# Patient Record
Sex: Female | Born: 1950
Health system: Southern US, Community
[De-identification: ages and names within clinical notes are randomized; demographics above are authoritative.]

## PROBLEM LIST (undated history)

## (undated) DIAGNOSIS — F988 Other specified behavioral and emotional disorders with onset usually occurring in childhood and adolescence: Secondary | ICD-10-CM

## (undated) DIAGNOSIS — G4730 Sleep apnea, unspecified: Secondary | ICD-10-CM

## (undated) DIAGNOSIS — E039 Hypothyroidism, unspecified: Secondary | ICD-10-CM

## (undated) DIAGNOSIS — R7301 Impaired fasting glucose: Secondary | ICD-10-CM

## (undated) DIAGNOSIS — L309 Dermatitis, unspecified: Secondary | ICD-10-CM

## (undated) DIAGNOSIS — F1910 Other psychoactive substance abuse, uncomplicated: Secondary | ICD-10-CM

## (undated) DIAGNOSIS — L709 Acne, unspecified: Secondary | ICD-10-CM

## (undated) DIAGNOSIS — T7840XA Allergy, unspecified, initial encounter: Secondary | ICD-10-CM

## (undated) DIAGNOSIS — I639 Cerebral infarction, unspecified: Secondary | ICD-10-CM

## (undated) DIAGNOSIS — F1010 Alcohol abuse, uncomplicated: Secondary | ICD-10-CM

## (undated) DIAGNOSIS — F419 Anxiety disorder, unspecified: Secondary | ICD-10-CM

## (undated) DIAGNOSIS — J157 Pneumonia due to Mycoplasma pneumoniae: Secondary | ICD-10-CM

## (undated) DIAGNOSIS — E162 Hypoglycemia, unspecified: Secondary | ICD-10-CM

## (undated) DIAGNOSIS — F32A Depression, unspecified: Secondary | ICD-10-CM

## (undated) DIAGNOSIS — G2581 Restless legs syndrome: Secondary | ICD-10-CM

## (undated) DIAGNOSIS — F329 Major depressive disorder, single episode, unspecified: Secondary | ICD-10-CM

## (undated) DIAGNOSIS — J449 Chronic obstructive pulmonary disease, unspecified: Secondary | ICD-10-CM

## (undated) DIAGNOSIS — L509 Urticaria, unspecified: Secondary | ICD-10-CM

## (undated) DIAGNOSIS — J302 Other seasonal allergic rhinitis: Secondary | ICD-10-CM

## (undated) DIAGNOSIS — I10 Essential (primary) hypertension: Secondary | ICD-10-CM

## (undated) DIAGNOSIS — M1990 Unspecified osteoarthritis, unspecified site: Secondary | ICD-10-CM

## (undated) HISTORY — DX: Essential (primary) hypertension: I10

## (undated) HISTORY — DX: Major depressive disorder, single episode, unspecified: F32.9

## (undated) HISTORY — DX: Anxiety disorder, unspecified: F41.9

## (undated) HISTORY — DX: Restless legs syndrome: G25.81

## (undated) HISTORY — DX: Dermatitis, unspecified: L30.9

## (undated) HISTORY — DX: Other psychoactive substance abuse, uncomplicated: F19.10

## (undated) HISTORY — DX: Unspecified osteoarthritis, unspecified site: M19.90

## (undated) HISTORY — DX: Sleep apnea, unspecified: G47.30

## (undated) HISTORY — DX: Allergy, unspecified, initial encounter: T78.40XA

## (undated) HISTORY — PX: WISDOM TOOTH EXTRACTION: SHX21

## (undated) HISTORY — DX: Alcohol abuse, uncomplicated: F10.10

## (undated) HISTORY — DX: Hypothyroidism, unspecified: E03.9

## (undated) HISTORY — DX: Hypoglycemia, unspecified: E16.2

## (undated) HISTORY — DX: Depression, unspecified: F32.A

## (undated) HISTORY — DX: Pneumonia due to Mycoplasma pneumoniae: J15.7

## (undated) HISTORY — DX: Urticaria, unspecified: L50.9

## (undated) HISTORY — DX: Other specified behavioral and emotional disorders with onset usually occurring in childhood and adolescence: F98.8

## (undated) HISTORY — DX: Impaired fasting glucose: R73.01

## (undated) HISTORY — DX: Other seasonal allergic rhinitis: J30.2

## (undated) HISTORY — PX: DILATION AND CURETTAGE OF UTERUS: SHX78

## (undated) HISTORY — DX: Chronic obstructive pulmonary disease, unspecified: J44.9

## (undated) HISTORY — DX: Cerebral infarction, unspecified: I63.9

---

## 2003-06-28 ENCOUNTER — Ambulatory Visit (HOSPITAL_COMMUNITY): Admission: RE | Admit: 2003-06-28 | Discharge: 2003-06-28 | Payer: Self-pay | Admitting: Obstetrics and Gynecology

## 2004-06-19 ENCOUNTER — Ambulatory Visit: Payer: Self-pay | Admitting: Internal Medicine

## 2004-09-21 ENCOUNTER — Ambulatory Visit: Payer: Self-pay | Admitting: Internal Medicine

## 2007-11-14 ENCOUNTER — Ambulatory Visit: Payer: Self-pay | Admitting: Internal Medicine

## 2007-11-14 DIAGNOSIS — J157 Pneumonia due to Mycoplasma pneumoniae: Secondary | ICD-10-CM | POA: Insufficient documentation

## 2007-11-14 DIAGNOSIS — J309 Allergic rhinitis, unspecified: Secondary | ICD-10-CM | POA: Insufficient documentation

## 2007-11-14 DIAGNOSIS — I10 Essential (primary) hypertension: Secondary | ICD-10-CM | POA: Insufficient documentation

## 2007-11-14 DIAGNOSIS — J4520 Mild intermittent asthma, uncomplicated: Secondary | ICD-10-CM | POA: Insufficient documentation

## 2007-11-14 DIAGNOSIS — E039 Hypothyroidism, unspecified: Secondary | ICD-10-CM | POA: Insufficient documentation

## 2007-11-14 DIAGNOSIS — F988 Other specified behavioral and emotional disorders with onset usually occurring in childhood and adolescence: Secondary | ICD-10-CM | POA: Insufficient documentation

## 2007-11-14 DIAGNOSIS — E162 Hypoglycemia, unspecified: Secondary | ICD-10-CM | POA: Insufficient documentation

## 2007-11-14 DIAGNOSIS — F341 Dysthymic disorder: Secondary | ICD-10-CM | POA: Insufficient documentation

## 2007-11-14 DIAGNOSIS — F1010 Alcohol abuse, uncomplicated: Secondary | ICD-10-CM | POA: Insufficient documentation

## 2007-12-12 ENCOUNTER — Telehealth (INDEPENDENT_AMBULATORY_CARE_PROVIDER_SITE_OTHER): Payer: Self-pay | Admitting: *Deleted

## 2008-01-07 ENCOUNTER — Ambulatory Visit: Payer: Self-pay | Admitting: Internal Medicine

## 2008-01-07 LAB — CONVERTED CEMR LAB
AST: 28 units/L (ref 0–37)
Albumin: 4 g/dL (ref 3.5–5.2)
Alkaline Phosphatase: 84 units/L (ref 39–117)
Cholesterol: 192 mg/dL (ref 0–200)
Glucose, Bld: 120 mg/dL — ABNORMAL HIGH (ref 70–99)
Hgb A1c MFr Bld: 6.1 % — ABNORMAL HIGH (ref 4.6–6.0)
LDL Cholesterol: 108 mg/dL — ABNORMAL HIGH (ref 0–99)
Potassium: 4.3 meq/L (ref 3.5–5.1)
Sodium: 140 meq/L (ref 135–145)
Total Bilirubin: 0.7 mg/dL (ref 0.3–1.2)
Total CHOL/HDL Ratio: 3.3

## 2008-01-14 ENCOUNTER — Ambulatory Visit: Payer: Self-pay | Admitting: Internal Medicine

## 2008-01-14 DIAGNOSIS — R7303 Prediabetes: Secondary | ICD-10-CM | POA: Insufficient documentation

## 2008-02-11 ENCOUNTER — Telehealth: Payer: Self-pay | Admitting: Internal Medicine

## 2008-02-17 ENCOUNTER — Ambulatory Visit: Payer: Self-pay | Admitting: Internal Medicine

## 2008-02-19 DIAGNOSIS — G2581 Restless legs syndrome: Secondary | ICD-10-CM | POA: Insufficient documentation

## 2009-03-07 ENCOUNTER — Telehealth: Payer: Self-pay | Admitting: Internal Medicine

## 2010-02-07 NOTE — Progress Notes (Signed)
  Phone Note Refill Request Message from:  Fax from Pharmacy on March 07, 2009 9:27 AM  Refills Requested: Medication #1:  PROAIR HFA 108 (90 BASE) MCG/ACT AERS as needed Initial call taken by: Ami Bullins CMA,  March 07, 2009 9:27 AM    Prescriptions: PROAIR HFA 108 (90 BASE) MCG/ACT AERS (ALBUTEROL SULFATE) as needed  #1 x 6   Entered by:   Ami Bullins CMA   Authorized by:   Jacques Navy MD   Signed by:   Bill Salinas CMA on 03/07/2009   Method used:   [facility]        [facility]  [address] [phone][facility] (retail)       [address]       [place], [place]  [address]       Ph: [phone] or [phone]       Fax: [phone]   RxID:   [id number]

## 2010-08-26 ENCOUNTER — Other Ambulatory Visit: Payer: Self-pay | Admitting: Internal Medicine

## 2010-09-28 ENCOUNTER — Other Ambulatory Visit: Payer: Self-pay | Admitting: *Deleted

## 2010-09-28 MED ORDER — ALBUTEROL SULFATE HFA 108 (90 BASE) MCG/ACT IN AERS: 1.0000 | INHALATION_SPRAY | RESPIRATORY_TRACT | Status: DC | PRN

## 2010-12-07 ENCOUNTER — Ambulatory Visit (INDEPENDENT_AMBULATORY_CARE_PROVIDER_SITE_OTHER): Payer: BC Managed Care – PPO | Admitting: Internal Medicine

## 2010-12-07 ENCOUNTER — Other Ambulatory Visit: Payer: Self-pay | Admitting: Internal Medicine

## 2010-12-07 ENCOUNTER — Encounter: Payer: Self-pay | Admitting: Internal Medicine

## 2010-12-07 ENCOUNTER — Other Ambulatory Visit (INDEPENDENT_AMBULATORY_CARE_PROVIDER_SITE_OTHER): Payer: BC Managed Care – PPO

## 2010-12-07 DIAGNOSIS — E039 Hypothyroidism, unspecified: Secondary | ICD-10-CM

## 2010-12-07 DIAGNOSIS — Z0000 Encounter for general adult medical examination without abnormal findings: Secondary | ICD-10-CM

## 2010-12-07 DIAGNOSIS — G2581 Restless legs syndrome: Secondary | ICD-10-CM

## 2010-12-07 DIAGNOSIS — Z136 Encounter for screening for cardiovascular disorders: Secondary | ICD-10-CM

## 2010-12-07 DIAGNOSIS — I10 Essential (primary) hypertension: Secondary | ICD-10-CM

## 2010-12-07 DIAGNOSIS — F988 Other specified behavioral and emotional disorders with onset usually occurring in childhood and adolescence: Secondary | ICD-10-CM

## 2010-12-07 DIAGNOSIS — D509 Iron deficiency anemia, unspecified: Secondary | ICD-10-CM

## 2010-12-07 DIAGNOSIS — F341 Dysthymic disorder: Secondary | ICD-10-CM

## 2010-12-07 DIAGNOSIS — J45909 Unspecified asthma, uncomplicated: Secondary | ICD-10-CM

## 2010-12-07 DIAGNOSIS — E119 Type 2 diabetes mellitus without complications: Secondary | ICD-10-CM

## 2010-12-07 LAB — CBC WITH DIFFERENTIAL/PLATELET
Basophils Relative: 0.4 % (ref 0.0–3.0)
Eosinophils Absolute: 0.4 10*3/uL (ref 0.0–0.7)
Eosinophils Relative: 4.9 % (ref 0.0–5.0)
HCT: 46.3 % — ABNORMAL HIGH (ref 36.0–46.0)
Hemoglobin: 16.2 g/dL — ABNORMAL HIGH (ref 12.0–15.0)
Monocytes Absolute: 0.7 10*3/uL (ref 0.1–1.0)
Neutro Abs: 6.2 10*3/uL (ref 1.4–7.7)
Neutrophils Relative %: 69.3 % (ref 43.0–77.0)
Platelets: 248 10*3/uL (ref 150.0–400.0)
WBC: 9 10*3/uL (ref 4.5–10.5)

## 2010-12-07 LAB — COMPREHENSIVE METABOLIC PANEL
ALT: 32 U/L (ref 0–35)
Albumin: 4.7 g/dL (ref 3.5–5.2)
BUN: 13 mg/dL (ref 6–23)
CO2: 30 mEq/L (ref 19–32)
Chloride: 100 mEq/L (ref 96–112)
Creatinine, Ser: 0.7 mg/dL (ref 0.4–1.2)
Glucose, Bld: 112 mg/dL — ABNORMAL HIGH (ref 70–99)

## 2010-12-07 LAB — LIPID PANEL: VLDL: 38.4 mg/dL (ref 0.0–40.0)

## 2010-12-07 LAB — VITAMIN B12: Vitamin B-12: 472 pg/mL (ref 211–911)

## 2010-12-07 LAB — TSH: TSH: 1.03 u[IU]/mL (ref 0.35–5.50)

## 2010-12-07 NOTE — Progress Notes (Signed)
Subjective:    Patient ID: Toni Hicks, female    DOB: 1950-10-21, 60 y.o.   MRN: [id number]  HPI Ms. Gosch presents for an annual general medical exam. She is having a lot of problems with allergy: congestion and drainage. She has a long history of hip pain-left. She is working with a Teacher, adult education and is making some headway. She is swimming and that is also helping. Her HTN adn hypoglycemia are fairly stable. Continues to have restless leg syndrome. She has a history of elevated iron levels and needs monitoring. She has had h/o hypothyroid disease which needs to be monitored.   Past Medical History  Diagnosis Date  . Restless leg syndrome   . Type II or unspecified type diabetes mellitus without mention of complication, not stated as uncontrolled   . Alcohol abuse   . ADD (attention deficit disorder)   . Mycoplasma pneumonia   . Congenital pneumonia   . Hypothyroidism   . Allergic rhinitis   . Hypoglycemia   . Anxiety and depression   . Asthma    Past Surgical History  Procedure Date  . Wisdom tooth extraction   . Dilation and curettage of uterus     @ 60 years old   Family History  Problem Relation Age of Onset  . Cancer Mother     breast  . Hypertension Mother   . Dementia Mother   . COPD Father   . Heart failure Father   . Coronary artery disease Father    History   Social History  . Marital Status: Married    Spouse Name: N/A    Number of Children: N/A  . Years of Education: N/A   Occupational History  . Not on file.   Social History Main Topics  . Smoking status: Former Games developer  . Smokeless tobacco: Not on file  . Alcohol Use: Not on file  . Drug Use: Not on file  . Sexually Active: Not on file   Other Topics Concern  . 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     Objective:   Physical Exam Vitals reviewed - and stable. Gen'l: well nourished, well developed white woman in no distress HEENT - [place]/AT, EACs/TMs normal, oropharynx with native dentition in good condition, no buccal or palatal lesions, posterior pharynx clear, mucous membranes moist. C&S clear, PERRLA, fundi - normal Neck - supple, no thyromegaly Nodes- negative submental, cervical, supraclavicular regions Chest - no deformity, no CVAT Lungs - cleat without rales, wheezes. No increased work of breathing Breast - Deferred to family planning clinic Cardiovascular - regular rate and rhythm, quiet precordium, no murmurs, rubs or gallops, 2+ radial, DP and PT pulses Abdomen - BS+ x 4, no HSM, no guarding or rebound or tenderness Pelvic - deferred to family planning clinic Extremities - no clubbing, cyanosis, edema or deformity.  Neuro - A&O x 3, CN II-XII normal, motor strength normal and equal, DTRs 2+ and symmetrical biceps, radial, and patellar tendons. Cerebellar - no tremor, no rigidity, fluid movement and normal gait. Derm - Head, neck, back, abdomen and extremities without  suspicious lesions   Lab Results  Component Value Date   WBC 9.0 12/07/2010   HGB 16.2* 12/07/2010   HCT 46.3* 12/07/2010   PLT 248.0 12/07/2010   GLUCOSE 112* 12/07/2010   CHOL 196 12/07/2010   TRIG 192.0* 12/07/2010   HDL 62.90 12/07/2010   LDLCALC 95 12/07/2010   ALT 32 12/07/2010   AST 38* 12/07/2010   NA 138 12/07/2010   K 4.0 12/07/2010   CL 100 12/07/2010   CREATININE 0.7 12/07/2010   BUN 13 12/07/2010   CO2 30 12/07/2010   TSH 1.03 12/07/2010   HGBA1C 5.7 12/07/2010          Assessment & Plan:

## 2010-12-10 DIAGNOSIS — Z0001 Encounter for general adult medical examination with abnormal findings: Secondary | ICD-10-CM | POA: Insufficient documentation

## 2010-12-10 DIAGNOSIS — Z0000 Encounter for general adult medical examination without abnormal findings: Secondary | ICD-10-CM | POA: Insufficient documentation

## 2010-12-10 NOTE — Assessment & Plan Note (Signed)
Lab Results  Component Value Date   TSH 1.03 12/07/2010   Normal range TSH - no indication for medication

## 2010-12-10 NOTE — Assessment & Plan Note (Signed)
Interval medical history is benign. Physical exam, sans breast and pelvic, is normal. She reports that she has breast exam and pelvic at [facility] clinic. No mammogram in EMR. She is due for colonoscopy - no report in EMR. Immunizations - she is due for Tdap and flu vaccine.  IN summary - a very nice woman who has many medical problems but who has good lab results with no indication for any medical therapy at this time.

## 2010-12-10 NOTE — Assessment & Plan Note (Signed)
Stable with no respiratory distress. Has been using MDI more than 3 times a week.  Plan - advair 1 inhalation AM and HS

## 2010-12-10 NOTE — Assessment & Plan Note (Signed)
BP Readings from Last 3 Encounters:  12/07/10 140/92  02/17/08 108/68  01/14/08 130/84   Adequate control w/o medication.

## 2010-12-10 NOTE — Assessment & Plan Note (Signed)
Appears to be stable and doing well. Lots of work therefore lots of stress. Financially things are tough ( land yes, cash no). Tolerating celexa well w/o adverse side effects.  Plan - continue present medication

## 2010-12-10 NOTE — Assessment & Plan Note (Signed)
No change - is doing well at a high level of functioning.

## 2010-12-10 NOTE — Assessment & Plan Note (Signed)
Lab Results  Component Value Date   HGBA1C 5.7 12/07/2010   Normal range indicating normal blood sugar levels for the last 90-120 days.  Plan - continue healthy low sugar low carb diet.

## 2011-01-15 ENCOUNTER — Telehealth: Payer: Self-pay | Admitting: *Deleted

## 2011-01-15 MED ORDER — ALBUTEROL SULFATE HFA 108 (90 BASE) MCG/ACT IN AERS: 1.0000 | INHALATION_SPRAY | RESPIRATORY_TRACT | Status: DC | PRN

## 2011-01-15 MED ORDER — FLUTICASONE-SALMETEROL 250-50 MCG/DOSE IN AEPB: 1.0000 | INHALATION_SPRAY | Freq: Two times a day (BID) | RESPIRATORY_TRACT | Status: DC

## 2011-01-15 MED ORDER — CITALOPRAM HYDROBROMIDE 10 MG PO TABS: 10.0000 mg | ORAL_TABLET | Freq: Every day | ORAL | Status: DC

## 2011-01-15 NOTE — Telephone Encounter (Signed)
Called pt back no answer LMOM [facility] & will send md note requesting labs results...01/15/11@10 :34am/LMB

## 2011-01-15 NOTE — Telephone Encounter (Signed)
Mailed copy of CPX report with labs -

## 2011-01-15 NOTE — Telephone Encounter (Signed)
Pt call left vm saw md 6 weeks ago for cpx. Haven't heard back on lab results & needing refills on all meds...01/15/11@10 :10am/LMB

## 2011-01-15 NOTE — Telephone Encounter (Signed)
Called pt again still no answer LMOM md mail cpx report...01/15/11@2 :05pm/LMB

## 2011-01-16 ENCOUNTER — Other Ambulatory Visit: Payer: Self-pay | Admitting: *Deleted

## 2011-01-16 MED ORDER — ALBUTEROL SULFATE HFA 108 (90 BASE) MCG/ACT IN AERS: 2.0000 | INHALATION_SPRAY | Freq: Four times a day (QID) | RESPIRATORY_TRACT | Status: DC | PRN

## 2011-05-25 ENCOUNTER — Other Ambulatory Visit: Payer: Self-pay | Admitting: Internal Medicine

## 2011-06-18 ENCOUNTER — Encounter: Payer: Self-pay | Admitting: Internal Medicine

## 2011-06-18 ENCOUNTER — Telehealth: Payer: Self-pay | Admitting: Internal Medicine

## 2011-06-18 ENCOUNTER — Ambulatory Visit (INDEPENDENT_AMBULATORY_CARE_PROVIDER_SITE_OTHER): Payer: BC Managed Care – PPO | Admitting: Internal Medicine

## 2011-06-18 VITALS — BP 122/90 | HR 100 | Temp 98.5°F | Resp 16 | Ht 62.0 in | Wt 154.0 lb

## 2011-06-18 DIAGNOSIS — J069 Acute upper respiratory infection, unspecified: Secondary | ICD-10-CM

## 2011-06-18 MED ORDER — PROMETHAZINE-CODEINE 6.25-10 MG/5ML PO SYRP: 5.0000 mL | ORAL_SOLUTION | ORAL | Status: AC | PRN

## 2011-06-18 MED ORDER — DESOXIMETASONE 0.25 % EX CREA: TOPICAL_CREAM | CUTANEOUS | Status: DC | PRN

## 2011-06-18 MED ORDER — ALBUTEROL SULFATE HFA 108 (90 BASE) MCG/ACT IN AERS: 2.0000 | INHALATION_SPRAY | Freq: Four times a day (QID) | RESPIRATORY_TRACT | Status: DC | PRN

## 2011-06-18 NOTE — Patient Instructions (Signed)
Viral Upper respiratory infection - plan: no need for antibiotics; take claritin (generic) once a day; take mucinex 1200 mg twice a day; phenergan with codiene cough syrup 1 tsp every 6 hours; hydrate, tylenol 500 to 1,000 mg three times a day. Continue all your other medications.   Upper Respiratory Infection, Adult An upper respiratory infection (URI) is also sometimes known as the common cold. The upper respiratory tract includes the nose, sinuses, throat, trachea, and bronchi. Bronchi are the airways leading to the lungs. Most people improve within 1 week, but symptoms can last up to 2 weeks. A residual cough may last even longer.   CAUSES Many different viruses can infect the tissues lining the upper respiratory tract. The tissues become irritated and inflamed and often become very moist. Mucus production is also common. A cold is contagious. You can easily spread the virus to others by oral contact. This includes kissing, sharing a glass, coughing, or sneezing. Touching your mouth or nose and then touching a surface, which is then touched by another person, can also spread the virus. SYMPTOMS   Symptoms typically develop 1 to 3 days after you come in contact with a cold virus. Symptoms vary from person to person. They may include:  Runny nose.   Sneezing.   Nasal congestion.   Sinus irritation.   Sore throat.   Loss of voice (laryngitis).   Cough.   Fatigue.   Muscle aches.   Loss of appetite.   Headache.   Low-grade fever.  DIAGNOSIS   You might diagnose your own cold based on familiar symptoms, since most people get a cold 2 to 3 times a year. Your caregiver can confirm this based on your exam. Most importantly, your caregiver can check that your symptoms are not due to another disease such as strep throat, sinusitis, pneumonia, asthma, or epiglottitis. Blood tests, throat tests, and X-rays are not necessary to diagnose a common cold, but they may sometimes be helpful in  excluding other more serious diseases. Your caregiver will decide if any further tests are required. RISKS AND COMPLICATIONS   You may be at risk for a more severe case of the common cold if you smoke cigarettes, have chronic heart disease (such as heart failure) or lung disease (such as asthma), or if you have a weakened immune system. The very young and very old are also at risk for more serious infections. Bacterial sinusitis, middle ear infections, and bacterial pneumonia can complicate the common cold. The common cold can worsen asthma and chronic obstructive pulmonary disease (COPD). Sometimes, these complications can require emergency medical care and may be life-threatening. PREVENTION   The best way to protect against getting a cold is to practice good hygiene. Avoid oral or hand contact with people with cold symptoms. Wash your hands often if contact occurs. There is no clear evidence that vitamin C, vitamin E, echinacea, or exercise reduces the chance of developing a cold. However, it is always recommended to get plenty of rest and practice good nutrition. TREATMENT   Treatment is directed at relieving symptoms. There is no cure. Antibiotics are not effective, because the infection is caused by a virus, not by bacteria. Treatment may include:  Increased fluid intake. Sports drinks offer valuable electrolytes, sugars, and fluids.   Breathing heated mist or steam (vaporizer or shower).   Eating chicken soup or other clear broths, and maintaining good nutrition.   Getting plenty of rest.   Using gargles or lozenges for comfort.  Controlling fevers with ibuprofen or acetaminophen as directed by your caregiver.   Increasing usage of your inhaler if you have asthma.  Zinc gel and zinc lozenges, taken in the first 24 hours of the common cold, can shorten the duration and lessen the severity of symptoms. Pain medicines may help with fever, muscle aches, and throat pain. A variety of  non-prescription medicines are available to treat congestion and runny nose. Your caregiver can make recommendations and may suggest nasal or lung inhalers for other symptoms.   HOME CARE INSTRUCTIONS    Only take over-the-counter or prescription medicines for pain, discomfort, or fever as directed by your caregiver.   Use a warm mist humidifier or inhale steam from a shower to increase air moisture. This may keep secretions moist and make it easier to breathe.   Drink enough water and fluids to keep your urine clear or pale yellow.   Rest as needed.   Return to work when your temperature has returned to normal or as your caregiver advises. You may need to stay home longer to avoid infecting others. You can also use a face mask and careful hand washing to prevent spread of the virus.  SEEK MEDICAL CARE IF:    After the first few days, you feel you are getting worse rather than better.   You need your caregiver's advice about medicines to control symptoms.   You develop chills, worsening shortness of breath, or brown or red sputum. These may be signs of pneumonia.   You develop yellow or brown nasal discharge or pain in the face, especially when you bend forward. These may be signs of sinusitis.   You develop a fever, swollen neck glands, pain with swallowing, or white areas in the back of your throat. These may be signs of strep throat.  SEEK IMMEDIATE MEDICAL CARE IF:    You have a fever.   You develop severe or persistent headache, ear pain, sinus pain, or chest pain.   You develop wheezing, a prolonged cough, cough up blood, or have a change in your usual mucus (if you have chronic lung disease).   You develop sore muscles or a stiff neck.  Document Released: 06/20/2000 Document Revised: 12/14/2010 Document Reviewed: 04/28/2010 [facility] Patient Information 2012 [place], [place].

## 2011-06-18 NOTE — Progress Notes (Signed)
  Subjective:    Patient ID: Toni Hicks, female    DOB: 01-07-1951, 61 y.o.   MRN: [id number]  HPI Patient presents with cough for 3 days, low grade fever, productive of yellow sputum. She has increased SOB. No N/V  PMH, FamHx and SocHx reviewed for any changes and relevance.    Review of Systems System review is negative for any constitutional, cardiac, pulmonary, GI or neuro symptoms or complaints other than as described in the HPI.     Objective:   Physical Exam Filed Vitals:   06/18/11 1040  BP: 122/90  Pulse: 100  Temp: 98.5 F (36.9 C)  Resp: 16   Gen'l- WNWD mildly overweight white woman in no acute distress HEENT- TMs normal , throat clear Neck - supple Nodes - prominent non-tender submandibular nodes Cor- RRR Pulm - normal respirations. Coarse rhonchi with wet cough. No wheezing.       Assessment & Plan:  URI - sinus and bronchial congestion. No evidence of a bacterial infection  Plan - supportive care - prom/cod cough syrup  Plan - no indication for antibiotics  Supportive care: claritin daily, promethazine/codeine

## 2011-06-18 NOTE — Telephone Encounter (Signed)
Caller: Saffron/Patient; PCP: Illene Regulus; CB#: [phone];  Call regarding Cough/Congestion;  Onset: 06/14/11.  Temp 100.5 po at 0900.  Productive yellow/brown cough.  Menopausal.  Advised to see MD within 24 hrs for productive cough with colored sputum per URI Guideline. Declined appt for 06/19/11.  Called office; Dr Debby Bud advised may come to office now.

## 2011-07-02 ENCOUNTER — Telehealth: Payer: Self-pay | Admitting: Internal Medicine

## 2011-07-02 MED ORDER — PROMETHAZINE-CODEINE 6.25-10 MG/5ML PO SYRP: 5.0000 mL | ORAL_SOLUTION | ORAL | Status: AC | PRN

## 2011-07-02 NOTE — Telephone Encounter (Signed)
Done

## 2011-07-02 NOTE — Telephone Encounter (Signed)
Ok for refill on prom/cod syrup

## 2011-07-02 NOTE — Telephone Encounter (Signed)
ller: Lorri/Patient is calling with a question about Promethazine With Codeine Cough Syrup.The medication was written by Illene Regulus. Pt is calling to let Dr. Debby Bud know that she is now out of her codeine cough syrup. Pt was seen for a cough 2 weeks ago and advsied that this was a virus. Pt does state that the cough is improved. She is now afeb. Temp 98.8 but she is asking for more cough medicine so she can sleep and make it through the night. Pt is using Advair BID  and Albuterol inhalers BID; pt is also using Mucinex; Tylenol and Claritin as ordered. Sputum is still yellow but has been getting clearer. [facility] [phone]

## 2011-09-14 ENCOUNTER — Other Ambulatory Visit: Payer: Self-pay | Admitting: Internal Medicine

## 2011-12-24 ENCOUNTER — Other Ambulatory Visit: Payer: Self-pay | Admitting: Internal Medicine

## 2012-04-01 ENCOUNTER — Other Ambulatory Visit: Payer: Self-pay | Admitting: Internal Medicine

## 2012-04-01 ENCOUNTER — Other Ambulatory Visit: Payer: Self-pay | Admitting: *Deleted

## 2012-04-01 MED ORDER — ALBUTEROL SULFATE HFA 108 (90 BASE) MCG/ACT IN AERS: 2.0000 | INHALATION_SPRAY | Freq: Four times a day (QID) | RESPIRATORY_TRACT | Status: DC | PRN

## 2012-05-14 ENCOUNTER — Other Ambulatory Visit: Payer: Self-pay | Admitting: Internal Medicine

## 2012-06-12 ENCOUNTER — Encounter: Payer: Self-pay | Admitting: Internal Medicine

## 2012-06-12 ENCOUNTER — Ambulatory Visit (INDEPENDENT_AMBULATORY_CARE_PROVIDER_SITE_OTHER): Payer: BC Managed Care – PPO | Admitting: Internal Medicine

## 2012-06-12 VITALS — BP 142/92 | HR 78 | Temp 96.8°F | Ht 62.0 in | Wt 163.0 lb

## 2012-06-12 DIAGNOSIS — T148 Other injury of unspecified body region: Secondary | ICD-10-CM

## 2012-06-12 DIAGNOSIS — W57XXXA Bitten or stung by nonvenomous insect and other nonvenomous arthropods, initial encounter: Secondary | ICD-10-CM

## 2012-06-12 MED ORDER — DESOXIMETASONE 0.25 % EX CREA: TOPICAL_CREAM | CUTANEOUS | Status: DC | PRN

## 2012-06-12 MED ORDER — DOXYCYCLINE HYCLATE 100 MG PO TABS: 100.0000 mg | ORAL_TABLET | Freq: Two times a day (BID) | ORAL | Status: DC

## 2012-06-12 NOTE — Progress Notes (Signed)
Subjective:    Patient ID: Toni Hicks, female    DOB: 1950-01-26, 62 y.o.   MRN: [id number]  HPI  Pt presents to the clinic today with c/o a tick bite. This occurred 2 weeks ago while camping. She pulled off 2 ticks. They were on her less than 3 hours. She has had multiple tick bites in the past and thought nothing of it. She did become concerned about 1 week ao when she develop low grade fevers, fatigue and GI upset. She has never had these type of issues before. She has not been around anyone with similar symtpoms. She has not taken anything OTC for her symptoms. Nothing makes it worse and nothing makes it better.  Review of Systems      Past Medical History  Diagnosis Date  . Restless leg syndrome   . Type II or unspecified type diabetes mellitus without mention of complication, not stated as uncontrolled   . Alcohol abuse   . ADD (attention deficit disorder)   . Mycoplasma pneumonia   . Congenital pneumonia   . Hypothyroidism   . Allergic rhinitis   . Hypoglycemia   . Anxiety and depression   . Asthma     Current Outpatient Prescriptions  Medication Sig Dispense Refill  . Alpha-D-Galactosidase (BEANO PO) Take by mouth as needed.        [name] Kitchen aspirin 81 MG tablet Take 81 mg by mouth daily.        [name] Kitchen b complex vitamins tablet Take 1 tablet by mouth as needed.        . desoximetasone (TOPICORT) 0.25 % cream Apply topically as needed.  30 g  3  . Fluticasone-Salmeterol (ADVAIR) 250-50 MCG/DOSE AEPB Inhale 1 puff into the lungs every 12 (twelve) hours.  60 each  3  . MULTIPLE VITAMIN PO Take by mouth.        . Probiotic Product (PROBIOTIC FORMULA PO) Take 1-2 capsules by mouth daily.        . VENTOLIN HFA 108 (90 BASE) MCG/ACT inhaler INHALE 2 PUFFS INTO THE LUNGS EVERY 6 (SIX) HOURS AS NEEDED FOR WHEEZING.  18 g  3  . VENTOLIN HFA 108 (90 BASE) MCG/ACT inhaler TAKE 2 PUFFS INTO THE LUNGS EVERY 6 HOURS AS NEEDED FOR WHEEZING  18 each  1   No current facility-administered  medications for this visit.    No Known Allergies  Family History  Problem Relation Age of Onset  . Cancer Mother     breast  . Hypertension Mother   . Dementia Mother   . COPD Father   . Heart failure Father   . Coronary artery disease Father     History   Social History  . Marital Status: Married    Spouse Name: N/A    Number of Children: N/A  . Years of Education: N/A   Occupational History  . Not on file.   Social History Main Topics  . Smoking status: Former Games developer  . Smokeless tobacco: Not on file  . Alcohol Use: Not on file  . Drug Use: Not on file  . Sexually Active: Not on file   Other Topics Concern  . 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 Denies malaise, headache or abrupt weight changes.  Respiratory: Denies difficulty breathing, shortness of breath, cough or sputum production.   Cardiovascular: Denies chest pain, chest tightness, palpitations or swelling in the hands or feet.  Gastrointestinal: Denies abdominal pain, bloating, constipation, diarrhea or blood in the stool.  Musculoskeletal: Denies decrease in range of motion, difficulty with gait, muscle pain or joint pain and swelling.  Skin: Pt reports tick bite on back. Denies redness, rashes, lesions or ulcercations.  Neurological: Denies dizziness, difficulty with memory, difficulty with speech or problems with balance and coordination.   No other specific complaints in a complete review of systems (except as listed in HPI above).  Objective:   Physical Exam  BP 142/92  Pulse 78  Temp(Src) 96.8 F (36 C) (Oral)  Ht 5\' 2"  (1.575 m)  Wt 163 lb (73.936 kg)  BMI 29.81 kg/m2  SpO2  95% Wt Readings from Last 3 Encounters:  06/12/12 163 lb (73.936 kg)  06/18/11 154 lb (69.854 kg)  12/07/10 159 lb 12 oz (72.462 kg)    General: Appears her stated age, well developed, well nourished in NAD. Skin: Warm, dry and intact. 2 small ulcerations noted on right flank, not infected, no evidence of erythema migrans. Cardiovascular: Normal rate and rhythm. S1,S2 noted.  No murmur, rubs or gallops noted. No JVD or BLE edema. No carotid bruits noted. Pulmonary/Chest: Normal effort and positive vesicular breath sounds. No respiratory distress. No wheezes, rales or ronchi noted.  Abdomen: Soft and nontender. Normal bowel sounds, no bruits noted. No distention or masses noted. Liver, spleen and kidneys non palpable. Musculoskeletal: Normal range of motion. No signs of joint swelling. No difficulty with gait.  Neurological: Alert and oriented. Cranial nerves II-XII intact. Coordination normal. +DTRs bilaterally.       Assessment & Plan:   Tick bite of right flank, no evidence of superficial skin infection:  Will treat with Doxycycline BID x 14 days Continue to monitor symptoms, they should get better instead of worse Discussed with pt pros and cons of testing for tick born illness- pt declines at this time  RTC as needed or if fever returns, rash develops or GI symptoms get worse

## 2012-06-12 NOTE — Patient Instructions (Signed)
Deer Tick Bite Deer ticks are brown arachnids (spider family) that vary in size from as small as the head of a pin to 1/4 inch (1/2 cm) diameter. They thrive in wooded areas. Deer are the preferred host of adult deer ticks. Small rodents are the host of young ticks (nymphs). When a person walks in a field or wooded area, young and adult ticks in the surrounding grass and vegetation can attach themselves to the skin. They can suck blood for hours to days if unnoticed. Ticks are found all over the [place] Some ticks carry a specific bacteria (Borrelia burgdorferi) that causes an infection called Lyme disease. The bacteria is typically passed into a person during the blood sucking process. This happens after the tick has been attached for at least a number of hours. While ticks can be found all over the [place] those carrying the bacteria that causes Lyme disease are most common in [place] and the [place]. Only a small proportion of ticks in these areas carry the Lyme disease bacteria and cause human infections. Ticks usually attach to warm spots on the body, such as the:  Head.  Back.  Neck.  Armpits.  Groin. SYMPTOMS  Most of the time, a deer tick bite will not be felt. You may or may not see the attached tick. You may notice mild irritation or redness around the bite site. If the deer tick passes the Lyme disease bacteria to a person, a round, red rash may be noticed 2 to 3 days after the bite. The rash may be clear in the middle, like a bull's-eye or target. If not treated, other symptoms may develop several days to weeks after the onset of the rash. These symptoms may include:  New rash lesions.  Fatigue and weakness.  General ill feeling and achiness.  Chills.  Headache and neck pain.  Swollen lymph glands.  Sore muscles and joints. 5 to 15% of untreated people with Lyme disease may develop more severe illnesses after several weeks to months. This may include inflammation of the  brain lining (meningitis), nerve palsies, an abnormal heartbeat, or severe muscle and joint pain and inflammation (myositis or arthritis). DIAGNOSIS   Physical exam and medical history.  Viewing the tick if it was saved for confirmation.  Blood tests (to check or confirm the presence of Lyme disease). TREATMENT  Most ticks do not carry disease. If found, an attached tick should be removed using tweezers. Tweezers should be placed under the body of the tick so it is removed by its attachment parts (pincers). If there are signs or symptoms of being sick, or Lyme disease is confirmed, medicines (antibiotics) that kill germs are usually prescribed. In more severe cases, antibiotics may be given through an intravenous (IV) access. HOME CARE INSTRUCTIONS   Always remove ticks with tweezers. Do not use petroleum jelly or other methods to kill or remove the tick. Slide the tweezers under the body and pull out as much as you can. If you are not sure what it is, save it in a jar and show your caregiver.  Once you remove the tick, the skin will heal on its own. Wash your hands and the affected area with water and soap. You may place a bandage on the affected area.  Take medicine as directed. You may be advised to take a full course of antibiotics.  Follow up with your caregiver as recommended. FINDING OUT THE RESULTS OF YOUR TEST Not all test results are available   during your visit. If your test results are not back during the visit, make an appointment with your caregiver to find out the results. Do not assume everything is normal if you have not heard from your caregiver or the medical facility. It is important for you to follow up on all of your test results. PROGNOSIS  If Lyme disease is confirmed, early treatment with antibiotics is very effective. Following preventive guidelines is important since it is possible to get the disease more than once. PREVENTION   Wear long sleeves and long pants in  wooded or grassy areas. Tuck your pants into your socks.  Use an insect repellent while hiking.  Check yourself, your children, and your pets regularly for ticks after playing outside.  Clear piles of leaves or brush from your yard. Ticks might live there. SEEK MEDICAL CARE IF:   You or your child has an oral temperature above 102 F (38.9 C).  You develop a severe headache following the bite.  You feel generally ill.  You notice a rash.  You are having trouble removing the tick.  The bite area has red skin or yellow drainage. SEEK IMMEDIATE MEDICAL CARE IF:   Your face is weak and droopy or you have other neurological symptoms.  You have severe joint pain or weakness. MAKE SURE YOU:   Understand these instructions.  Will watch your condition.  Will get help right away if you are not doing well or get worse. FOR MORE INFORMATION Centers for Disease Control and Prevention: www.cdc.gov American Academy of [employer] Physicians: www.aafp.org Document Released: 03/21/2009 Document Revised: 03/19/2011 Document Reviewed: 03/21/2009 ExitCare Patient Information 2014 [employer].  

## 2012-06-24 ENCOUNTER — Other Ambulatory Visit: Payer: Self-pay | Admitting: Internal Medicine

## 2012-07-23 ENCOUNTER — Other Ambulatory Visit: Payer: Self-pay | Admitting: Internal Medicine

## 2012-08-11 ENCOUNTER — Other Ambulatory Visit: Payer: Self-pay | Admitting: Internal Medicine

## 2012-09-04 ENCOUNTER — Other Ambulatory Visit: Payer: Self-pay | Admitting: Internal Medicine

## 2012-09-22 ENCOUNTER — Other Ambulatory Visit: Payer: Self-pay | Admitting: Internal Medicine

## 2012-10-22 ENCOUNTER — Other Ambulatory Visit: Payer: Self-pay | Admitting: Internal Medicine

## 2012-11-20 ENCOUNTER — Other Ambulatory Visit: Payer: Self-pay

## 2012-11-20 ENCOUNTER — Other Ambulatory Visit: Payer: Self-pay | Admitting: Internal Medicine

## 2012-11-20 MED ORDER — FLUTICASONE-SALMETEROL 250-50 MCG/DOSE IN AEPB: 1.0000 | INHALATION_SPRAY | Freq: Two times a day (BID) | RESPIRATORY_TRACT | Status: DC

## 2013-03-11 ENCOUNTER — Telehealth: Payer: Self-pay

## 2013-03-11 NOTE — Telephone Encounter (Signed)
I called patient back to find out what specific symptoms she's having. She states it started yesterday with a sinus headache, congestion, painful cough. She used a nasal pot and ibuprofen that somewhat helped. Yesterday she was very cold and could not get warm. Temp was 101 yesterday. She states currently temp is a little over 100. Hoarse voice. No appetite. She does not want to come in. Please advise.

## 2013-03-11 NOTE — Telephone Encounter (Signed)
Last OV June '13! For URI. Last full exam Nov '12!  No rx w/o office visit.

## 2013-03-11 NOTE — Telephone Encounter (Signed)
The patient called and is hoping to get medicine called in for cold symptoms.  She states she does not want to come in for an appointment, but is hoping to get a medication called in.  Callback - [phone]

## 2013-03-11 NOTE — Telephone Encounter (Signed)
Patient has been advised

## 2013-03-11 NOTE — Telephone Encounter (Signed)
Left message to return call 

## 2013-04-01 ENCOUNTER — Other Ambulatory Visit: Payer: Self-pay | Admitting: Internal Medicine

## 2013-08-03 ENCOUNTER — Other Ambulatory Visit: Payer: Self-pay

## 2013-08-03 MED ORDER — ALBUTEROL SULFATE HFA 108 (90 BASE) MCG/ACT IN AERS: INHALATION_SPRAY | RESPIRATORY_TRACT | Status: DC

## 2013-09-08 ENCOUNTER — Telehealth: Payer: Self-pay | Admitting: *Deleted

## 2013-09-08 NOTE — Telephone Encounter (Signed)
Left msg on triage stating she is having some type of allergic reaction. Wanting to get a refill on the cream md rx. Called pt back no answer LMOM will need to make appt for f/u last saw Toni Hicks 06/2012...[name]

## 2013-09-14 ENCOUNTER — Other Ambulatory Visit: Payer: Self-pay | Admitting: Internal Medicine

## 2013-10-06 ENCOUNTER — Ambulatory Visit (INDEPENDENT_AMBULATORY_CARE_PROVIDER_SITE_OTHER): Payer: BC Managed Care – PPO | Admitting: Internal Medicine

## 2013-10-06 ENCOUNTER — Encounter: Payer: Self-pay | Admitting: Internal Medicine

## 2013-10-06 VITALS — BP 142/82 | HR 83 | Temp 98.6°F | Resp 18 | Ht 62.0 in | Wt 169.8 lb

## 2013-10-06 DIAGNOSIS — Z0000 Encounter for general adult medical examination without abnormal findings: Secondary | ICD-10-CM

## 2013-10-06 DIAGNOSIS — J309 Allergic rhinitis, unspecified: Secondary | ICD-10-CM

## 2013-10-06 DIAGNOSIS — R7303 Prediabetes: Secondary | ICD-10-CM

## 2013-10-06 DIAGNOSIS — I10 Essential (primary) hypertension: Secondary | ICD-10-CM

## 2013-10-06 DIAGNOSIS — J45909 Unspecified asthma, uncomplicated: Secondary | ICD-10-CM

## 2013-10-06 DIAGNOSIS — R7309 Other abnormal glucose: Secondary | ICD-10-CM

## 2013-10-06 MED ORDER — NYSTATIN-TRIAMCINOLONE 100000-0.1 UNIT/GM-% EX OINT: 1.0000 "application " | TOPICAL_OINTMENT | Freq: Two times a day (BID) | CUTANEOUS | Status: DC

## 2013-10-06 NOTE — Progress Notes (Signed)
Pre visit review using our clinic review tool, if applicable. No additional management support is needed unless otherwise documented below in the visit note. 

## 2013-10-06 NOTE — Patient Instructions (Signed)
We are going to check on your blood work today and call you about the results.   We will give you the cream for the yeast and irritation on your arm. Use it 2 times per day for 1 week for the yeast. After that you can restart it if needed if the rash comes back.   It may be a good idea to go see someone for your hip because if we can get you moving we may be able to get some weight off which may make some other things better as well.  Come back in about 6 months so we can check on your blood pressure and breathing.  If you are sick sooner please call us back.

## 2013-10-07 NOTE — Progress Notes (Signed)
   Subjective:    Patient ID: Toni Hicks, female    DOB: [birth year], 63 y.o.   MRN: [id number]  HPI The patient is a 63 YO female who is coming in today to establish care. She has PMH of asthma, anxiety, pre-diabetes. She is having a rash on her arms that itches with heat and sweating and she wants a cream for. She denies SOB but is not always able to afford her advair. This does a good job of controlling her breathing but sometimes is too expensive and she takes her albuterol as needed instead. Denies chest pain, headaches, excessive thirst or urination.   Review of Systems  Constitutional: Negative for fever, activity change, appetite change and fatigue.  HENT: Negative.   Eyes: Negative.   Respiratory: Positive for shortness of breath. Negative for cough, chest tightness and wheezing.        Sometimes at night.  Cardiovascular: Negative for chest pain, palpitations and leg swelling.  Gastrointestinal: Negative for abdominal pain, diarrhea, constipation and abdominal distention.  Endocrine: Negative.   Genitourinary: Negative.   Musculoskeletal: Negative.   Skin: Positive for rash.  Allergic/Immunologic: Positive for environmental allergies.  Neurological: Negative.       Objective:   Physical Exam  Vitals reviewed. Constitutional: She is oriented to person, place, and time. She appears well-developed and well-nourished.  Overweight   HENT:  Head: Normocephalic and atraumatic.  Eyes: EOM are normal.  Neck: Normal range of motion.  Cardiovascular: Normal rate and regular rhythm.   Pulmonary/Chest: Effort normal and breath sounds normal. No respiratory distress. She has no wheezes. She has no rales.  Abdominal: Soft. Bowel sounds are normal. She exhibits no distension. There is no tenderness. There is no rebound.  Neurological: She is alert and oriented to person, place, and time. Coordination normal.  Skin: Skin is warm and dry. Rash noted.  Appears to be yeast infection  rash on her antecubital fossa bilaterally.    Filed Vitals:   10/06/13 1525 10/06/13 1617  BP: 170/90 142/82  Pulse: 83   Temp: 98.6 F (37 C)   TempSrc: Oral   Resp: 18   Height: 5\' 2"  (1.575 m)   Weight: 169 lb 12.8 oz (77.021 kg)   SpO2: 96%       Assessment & Plan:

## 2013-10-07 NOTE — Assessment & Plan Note (Signed)
Advised her that the advair helps with inflammation in the lungs and can prevent damage to the lungs. Refill given for advair and albuterol.

## 2013-10-07 NOTE — Assessment & Plan Note (Signed)
Family history of diabetes and will check HgA1c today.

## 2013-10-07 NOTE — Assessment & Plan Note (Signed)
flonase works well for her and will continue.

## 2013-10-07 NOTE — Assessment & Plan Note (Signed)
Yeast on her arm rx for mycolog cream to be used BID for 1 week to help with resolution.

## 2013-10-07 NOTE — Assessment & Plan Note (Signed)
Patient not on medication and does not like medications. Her BP came down with rest and advised diet and weight loss as a way to keep herself off medications. Will monitor.

## 2013-12-10 ENCOUNTER — Other Ambulatory Visit: Payer: Self-pay | Admitting: Geriatric Medicine

## 2013-12-10 MED ORDER — FLUTICASONE-SALMETEROL 250-50 MCG/DOSE IN AEPB: 1.0000 | INHALATION_SPRAY | Freq: Two times a day (BID) | RESPIRATORY_TRACT | Status: DC

## 2013-12-12 ENCOUNTER — Other Ambulatory Visit: Payer: Self-pay | Admitting: Internal Medicine

## 2014-01-19 ENCOUNTER — Telehealth: Payer: Self-pay | Admitting: Internal Medicine

## 2014-01-19 NOTE — Telephone Encounter (Signed)
Pt requesting generic form of Ambien 40 mg tablet[facility] [phone]

## 2014-01-20 ENCOUNTER — Telehealth: Payer: Self-pay | Admitting: Internal Medicine

## 2014-01-20 MED ORDER — ZOLPIDEM TARTRATE 5 MG PO TABS: 5.0000 mg | ORAL_TABLET | Freq: Every evening | ORAL | Status: DC | PRN

## 2014-01-20 NOTE — Telephone Encounter (Signed)
She can try [place] but the dosing is limited to 5 mg based on her age and gender. Will print to fax in.

## 2014-01-20 NOTE — Telephone Encounter (Signed)
Spoke with patient and faxed prescription to pharmacy.

## 2014-01-20 NOTE — Telephone Encounter (Signed)
Called [facility] to verify if rx has been fax or called in md had printed but can't locate. Per Gus/pharmacist they have not receive gave md approval for zolpidem.

## 2014-01-20 NOTE — Telephone Encounter (Signed)
Patient says she has been having trouble sleeping. She had an old prescription of zolpidem 10 mg that she took last night and she said other than some crazy dreams and a little drowsiness she feels ok today. She says getting good sleep outweighs the side effects. Would you like to send in a prescription for this patient? Please advise, thanks.

## 2014-01-20 NOTE — Telephone Encounter (Signed)
Is requesting script for ambien 30 day supply to [facility] to [facility] [address] instead of [facility] order.

## 2014-01-20 NOTE — Telephone Encounter (Signed)
Pt has been notified...Toni Hicks

## 2014-03-05 ENCOUNTER — Emergency Department (HOSPITAL_COMMUNITY): Payer: BLUE CROSS/BLUE SHIELD

## 2014-03-05 ENCOUNTER — Encounter (HOSPITAL_COMMUNITY): Payer: Self-pay

## 2014-03-05 ENCOUNTER — Telehealth: Payer: Self-pay | Admitting: *Deleted

## 2014-03-05 ENCOUNTER — Inpatient Hospital Stay (HOSPITAL_COMMUNITY)
Admission: EM | Admit: 2014-03-05 | Discharge: 2014-03-06 | DRG: 066 | Disposition: A | Discharge status: Home / Self Care | Payer: BLUE CROSS/BLUE SHIELD | Attending: Family Medicine | Admitting: Family Medicine

## 2014-03-05 ENCOUNTER — Telehealth: Payer: Self-pay | Admitting: Internal Medicine

## 2014-03-05 DIAGNOSIS — F1010 Alcohol abuse, uncomplicated: Secondary | ICD-10-CM

## 2014-03-05 DIAGNOSIS — J4530 Mild persistent asthma, uncomplicated: Secondary | ICD-10-CM | POA: Diagnosis not present

## 2014-03-05 DIAGNOSIS — E669 Obesity, unspecified: Secondary | ICD-10-CM | POA: Diagnosis present

## 2014-03-05 DIAGNOSIS — E119 Type 2 diabetes mellitus without complications: Secondary | ICD-10-CM | POA: Diagnosis present

## 2014-03-05 DIAGNOSIS — Z7982 Long term (current) use of aspirin: Secondary | ICD-10-CM | POA: Diagnosis not present

## 2014-03-05 DIAGNOSIS — E039 Hypothyroidism, unspecified: Secondary | ICD-10-CM | POA: Diagnosis present

## 2014-03-05 DIAGNOSIS — R200 Anesthesia of skin: Secondary | ICD-10-CM | POA: Diagnosis not present

## 2014-03-05 DIAGNOSIS — J4520 Mild intermittent asthma, uncomplicated: Secondary | ICD-10-CM | POA: Diagnosis present

## 2014-03-05 DIAGNOSIS — Z8631 Personal history of diabetic foot ulcer: Secondary | ICD-10-CM

## 2014-03-05 DIAGNOSIS — I639 Cerebral infarction, unspecified: Principal | ICD-10-CM

## 2014-03-05 DIAGNOSIS — F419 Anxiety disorder, unspecified: Secondary | ICD-10-CM | POA: Diagnosis present

## 2014-03-05 DIAGNOSIS — I6789 Other cerebrovascular disease: Secondary | ICD-10-CM | POA: Diagnosis not present

## 2014-03-05 DIAGNOSIS — I10 Essential (primary) hypertension: Secondary | ICD-10-CM | POA: Diagnosis present

## 2014-03-05 DIAGNOSIS — F329 Major depressive disorder, single episode, unspecified: Secondary | ICD-10-CM | POA: Diagnosis present

## 2014-03-05 DIAGNOSIS — R7309 Other abnormal glucose: Secondary | ICD-10-CM

## 2014-03-05 DIAGNOSIS — E785 Hyperlipidemia, unspecified: Secondary | ICD-10-CM | POA: Diagnosis present

## 2014-03-05 DIAGNOSIS — G2581 Restless legs syndrome: Secondary | ICD-10-CM | POA: Diagnosis present

## 2014-03-05 DIAGNOSIS — I739 Peripheral vascular disease, unspecified: Secondary | ICD-10-CM | POA: Diagnosis present

## 2014-03-05 DIAGNOSIS — E162 Hypoglycemia, unspecified: Secondary | ICD-10-CM | POA: Diagnosis present

## 2014-03-05 DIAGNOSIS — I69359 Hemiplegia and hemiparesis following cerebral infarction affecting unspecified side: Secondary | ICD-10-CM | POA: Diagnosis present

## 2014-03-05 DIAGNOSIS — J309 Allergic rhinitis, unspecified: Secondary | ICD-10-CM | POA: Diagnosis present

## 2014-03-05 DIAGNOSIS — R7303 Prediabetes: Secondary | ICD-10-CM | POA: Diagnosis present

## 2014-03-05 DIAGNOSIS — J45909 Unspecified asthma, uncomplicated: Secondary | ICD-10-CM | POA: Diagnosis present

## 2014-03-05 HISTORY — DX: Acne, unspecified: L70.9

## 2014-03-05 LAB — CBC
HEMATOCRIT: 48.8 % — AB (ref 36.0–46.0)
HEMATOCRIT: 47.1 % — AB (ref 36.0–46.0)
HEMOGLOBIN: 16.8 g/dL — AB (ref 12.0–15.0)
HEMOGLOBIN: 16.1 g/dL — AB (ref 12.0–15.0)
MCH: 31.4 pg (ref 26.0–34.0)
MCH: 30.5 pg (ref 26.0–34.0)
MCHC: 34.4 g/dL (ref 30.0–36.0)
MCHC: 34.2 g/dL (ref 30.0–36.0)
MCV: 91.2 fL (ref 78.0–100.0)
MCV: 89.2 fL (ref 78.0–100.0)
Platelets: 206 10*3/uL (ref 150–400)
Platelets: 202 10*3/uL (ref 150–400)
RBC: 5.35 MIL/uL — ABNORMAL HIGH (ref 3.87–5.11)
RBC: 5.28 MIL/uL — ABNORMAL HIGH (ref 3.87–5.11)
RDW: 13.2 % (ref 11.5–15.5)
RDW: 13.1 % (ref 11.5–15.5)
WBC: 6.3 10*3/uL (ref 4.0–10.5)
WBC: 6.9 10*3/uL (ref 4.0–10.5)

## 2014-03-05 LAB — COMPREHENSIVE METABOLIC PANEL
ALK PHOS: 94 U/L (ref 39–117)
ALT: 71 U/L — AB (ref 0–35)
AST: 88 U/L — AB (ref 0–37)
Albumin: 4.7 g/dL (ref 3.5–5.2)
Anion gap: 11 (ref 5–15)
BILIRUBIN TOTAL: 0.6 mg/dL (ref 0.3–1.2)
BUN: 9 mg/dL (ref 6–23)
CHLORIDE: 102 mmol/L (ref 96–112)
CO2: 24 mmol/L (ref 19–32)
Calcium: 9.4 mg/dL (ref 8.4–10.5)
Creatinine, Ser: 0.61 mg/dL (ref 0.50–1.10)
GFR calc Af Amer: >90 mL/min (ref >90)
GFR calc non Af Amer: >90 mL/min (ref >90)
GLUCOSE: 126 mg/dL — AB (ref 70–99)
POTASSIUM: 3.8 mmol/L (ref 3.5–5.1)
SODIUM: 137 mmol/L (ref 135–145)
Total Protein: 8 g/dL (ref 6.0–8.3)

## 2014-03-05 LAB — APTT: APTT: 34 s (ref 24–37)

## 2014-03-05 LAB — I-STAT TROPONIN, ED: TROPONIN I, POC: 0 ng/mL (ref 0.00–0.08)

## 2014-03-05 LAB — DIFFERENTIAL
BASOS ABS: 0 10*3/uL (ref 0.0–0.1)
Basophils Relative: 1 % (ref 0–1)
Eosinophils Absolute: 0.2 10*3/uL (ref 0.0–0.7)
Eosinophils Relative: 3 % (ref 0–5)
LYMPHS PCT: 23 % (ref 12–46)
Lymphs Abs: 1.5 10*3/uL (ref 0.7–4.0)
MONO ABS: 0.8 10*3/uL (ref 0.1–1.0)
Monocytes Relative: 13 % — ABNORMAL HIGH (ref 3–12)
NEUTROS ABS: 3.8 10*3/uL (ref 1.7–7.7)
Neutrophils Relative %: 60 % (ref 43–77)

## 2014-03-05 LAB — PROTIME-INR
INR: 0.98 (ref 0.00–1.49)
Prothrombin Time: 13.1 seconds (ref 11.6–15.2)

## 2014-03-05 LAB — CREATININE, SERUM
CREATININE: 0.63 mg/dL (ref 0.50–1.10)
GFR calc Af Amer: >90 mL/min (ref >90)

## 2014-03-05 LAB — CBG MONITORING, ED: Glucose-Capillary: 125 mg/dL — ABNORMAL HIGH (ref 70–99)

## 2014-03-05 LAB — GLUCOSE, CAPILLARY: GLUCOSE-CAPILLARY: 137 mg/dL — AB (ref 70–99)

## 2014-03-05 MED ORDER — ENOXAPARIN SODIUM 40 MG/0.4ML ~~LOC~~ SOLN
40.0000 mg | SUBCUTANEOUS | Status: DC
  Administered 2014-03-05: 40 mg via SUBCUTANEOUS
  Filled 2014-03-05: qty 0.4

## 2014-03-05 MED ORDER — STROKE: EARLY STAGES OF RECOVERY BOOK
Freq: Once | Status: AC
  Administered 2014-03-05: 18:00:00

## 2014-03-05 MED ORDER — SENNOSIDES-DOCUSATE SODIUM 8.6-50 MG PO TABS: 1.0000 | ORAL_TABLET | Freq: Every evening | ORAL | Status: DC | PRN

## 2014-03-05 MED ORDER — ASPIRIN 300 MG RE SUPP: 300.0000 mg | Freq: Every day | RECTAL | Status: DC

## 2014-03-05 MED ORDER — ASPIRIN 325 MG PO TABS
325.0000 mg | ORAL_TABLET | Freq: Every day | ORAL | Status: DC
  Administered 2014-03-05 – 2014-03-06 (×2): 325 mg via ORAL
  Filled 2014-03-05 (×2): qty 1

## 2014-03-05 MED ORDER — ZOLPIDEM TARTRATE 5 MG PO TABS
2.5000 mg | ORAL_TABLET | Freq: Every evening | ORAL | Status: DC | PRN
Start: 2014-03-05 — End: 2014-03-06
  Administered 2014-03-05: 2.5 mg via ORAL
  Filled 2014-03-05: qty 1

## 2014-03-05 MED ORDER — DIPHENHYDRAMINE HCL 25 MG PO CAPS
25.0000 mg | ORAL_CAPSULE | Freq: Four times a day (QID) | ORAL | Status: DC | PRN
  Administered 2014-03-05: 25 mg via ORAL
  Filled 2014-03-05: qty 1

## 2014-03-05 MED ORDER — ALBUTEROL SULFATE (2.5 MG/3ML) 0.083% IN NEBU
2.5000 mg | INHALATION_SOLUTION | Freq: Four times a day (QID) | RESPIRATORY_TRACT | Status: DC | PRN
  Filled 2014-03-05: qty 3

## 2014-03-05 MED ORDER — ACETAMINOPHEN 650 MG RE SUPP: 650.0000 mg | RECTAL | Status: DC | PRN

## 2014-03-05 MED ORDER — TRIAMCINOLONE ACETONIDE 0.025 % EX CREA
TOPICAL_CREAM | Freq: Two times a day (BID) | CUTANEOUS | Status: DC | PRN
  Filled 2014-03-05: qty 15

## 2014-03-05 MED ORDER — MOMETASONE FURO-FORMOTEROL FUM 100-5 MCG/ACT IN AERO
2.0000 | INHALATION_SPRAY | Freq: Two times a day (BID) | RESPIRATORY_TRACT | Status: DC
  Administered 2014-03-05 – 2014-03-06 (×2): 2 via RESPIRATORY_TRACT
  Filled 2014-03-05 (×2): qty 8.8

## 2014-03-05 MED ORDER — FLUOCINONIDE 0.05 % EX CREA
TOPICAL_CREAM | Freq: Three times a day (TID) | CUTANEOUS | Status: DC | PRN
  Administered 2014-03-05: 23:00:00 via TOPICAL
  Filled 2014-03-05 (×2): qty 30

## 2014-03-05 MED ORDER — ACETAMINOPHEN 325 MG PO TABS: 650.0000 mg | ORAL_TABLET | ORAL | Status: DC | PRN

## 2014-03-05 NOTE — Progress Notes (Signed)
Pt arrived to unit per stretcher from [facility] ED with ambulance personnel. No acute distress. Assessment performed as charted.  Pt's friend at bedside.  Will monitor pt closely.   Angeline Slim I 03/05/2014 6:02 PM

## 2014-03-05 NOTE — Progress Notes (Signed)
Report recd from [place], ED RN at [facility]. Will monitor for pt's arrival to unit    Angeline Slim I 03/05/2014 4:04 PM

## 2014-03-05 NOTE — ED Notes (Signed)
Pt in MRI.

## 2014-03-05 NOTE — ED Notes (Signed)
Admitting MD at bedside.

## 2014-03-05 NOTE — ED Provider Notes (Signed)
CSN: [id number]     Arrival date & time 03/05/14  1222 History   First MD Initiated Contact with Patient 03/05/14 1338     Chief Complaint  Patient presents with  . Numbness in face   . Tingling in arm      (Consider location/radiation/quality/duration/timing/severity/associated sxs/prior Treatment) Patient is a 64 y.o. female presenting with weakness. The history is provided by the patient (pt states she awoke with some numbness to the right arm and her right face.  she also states she had some problems with her right arm putting on her makeup).  Weakness This is a new problem. The current episode started 12 to 24 hours ago. The problem occurs constantly. The problem has not changed since onset.Pertinent negatives include no chest pain, no abdominal pain and no headaches. Nothing aggravates the symptoms. Nothing relieves the symptoms.    Past Medical History  Diagnosis Date  . Restless leg syndrome   . Type II or unspecified type diabetes mellitus without mention of complication, not stated as uncontrolled   . Alcohol abuse   . ADD (attention deficit disorder)   . Mycoplasma pneumonia   . Congenital pneumonia   . Hypothyroidism   . Allergic rhinitis   . Hypoglycemia   . Anxiety and depression   . Asthma    Past Surgical History  Procedure Laterality Date  . Wisdom tooth extraction    . Dilation and curettage of uterus      @ 64 years old   Family History  Problem Relation Age of Onset  . Cancer Mother     breast  . Hypertension Mother   . Dementia Mother   . COPD Father   . Heart failure Father   . Coronary artery disease Father    History  Substance Use Topics  . Smoking status: Former Research scientist (life sciences)  . Smokeless tobacco: Not on file  . Alcohol Use: Yes   OB History    No data available     Review of Systems  Constitutional: Negative for appetite change and fatigue.  HENT: Negative for congestion, ear discharge and sinus pressure.   Eyes: Negative for discharge.   Respiratory: Negative for cough.   Cardiovascular: Negative for chest pain.  Gastrointestinal: Negative for abdominal pain and diarrhea.  Genitourinary: Negative for frequency and hematuria.  Musculoskeletal: Negative for back pain.  Skin: Negative for rash.  Neurological: Positive for weakness. Negative for seizures and headaches.       Facial numbness and right arm numbness  Psychiatric/Behavioral: Negative for hallucinations.      Allergies  Other  Home Medications   Prior to Admission medications   Medication Sig Start Date End Date Taking? Authorizing Provider  Alpha-D-Galactosidase (BEANO PO) Take by mouth as needed.     Yes Historical Provider, MD  b complex vitamins tablet Take 1 tablet by mouth as needed.     Yes Historical Provider, MD  desoximetasone (TOPICORT) 0.25 % cream Apply topically as needed. 06/12/12  Yes Jearld Fenton, NP  Fluticasone-Salmeterol (ADVAIR DISKUS) 250-50 MCG/DOSE AEPB Inhale 1 puff into the lungs every 12 (twelve) hours. Patient taking differently: Inhale 1 puff into the lungs daily.  12/10/13  Yes Olga Millers, MD  OVER THE COUNTER MEDICATION Take 1 Container by mouth every other day. [place]   Yes Historical Provider, MD  VENTOLIN HFA 108 (90 BASE) MCG/ACT inhaler INHALE 2 PUFFS INTO THE LUNGS EVERY 6 HOURS AS NEEDED FOR WHEEZING 12/14/13  Yes Rowe Clack,  MD  zolpidem (AMBIEN) 5 MG tablet Take 1 tablet (5 mg total) by mouth at bedtime as needed for sleep. Patient taking differently: Take 2.5 mg by mouth at bedtime as needed for sleep.  01/20/14  Yes Olga Millers, MD  nystatin-triamcinolone ointment [facility]) Apply 1 application topically 2 (two) times daily. Patient not taking: Reported on 03/05/2014 10/06/13   Olga Millers, MD   BP 187/107 mmHg  Pulse 81  Temp(Src) 98.3 F (36.8 C) (Oral)  Resp 12  SpO2 97% Physical Exam  Constitutional: She is oriented to person, place, and time. She appears well-developed.  HENT:   Head: Normocephalic.  Eyes: Conjunctivae and EOM are normal. No scleral icterus.  Neck: Neck supple. No thyromegaly present.  Cardiovascular: Normal rate and regular rhythm.  Exam reveals no gallop and no friction rub.   No murmur heard. Pulmonary/Chest: No stridor. She has no wheezes. She has no rales. She exhibits no tenderness.  Abdominal: She exhibits no distension. There is no tenderness. There is no rebound.  Musculoskeletal: Normal range of motion. She exhibits no edema.  Lymphadenopathy:    She has no cervical adenopathy.  Neurological: She is oriented to person, place, and time. She exhibits normal muscle tone.  Mild numbness in right arm and hand with numbness to right side of face  Skin: No rash noted. No erythema.  Psychiatric: She has a normal mood and affect. Her behavior is normal.    ED Course  Procedures (including critical care time) Labs Review Labs Reviewed  CBC - Abnormal; Notable for the following:    RBC 5.35 (*)    Hemoglobin 16.8 (*)    HCT 48.8 (*)    All other components within normal limits  DIFFERENTIAL - Abnormal; Notable for the following:    Monocytes Relative 13 (*)    All other components within normal limits  COMPREHENSIVE METABOLIC PANEL - Abnormal; Notable for the following:    Glucose, Bld 126 (*)    AST 88 (*)    ALT 71 (*)    All other components within normal limits  CBG MONITORING, ED - Abnormal; Notable for the following:    Glucose-Capillary 125 (*)    All other components within normal limits  PROTIME-INR  APTT  I-STAT TROPOININ, ED    Imaging Review Ct Head (brain) Wo Contrast  03/05/2014   CLINICAL DATA:  Right-sided numbness for 1 day  EXAM: CT HEAD WITHOUT CONTRAST  TECHNIQUE: Contiguous axial images were obtained from the base of the skull through the vertex without intravenous contrast.  COMPARISON:  None.  FINDINGS: Bony calvarium is intact. No gross soft tissue abnormality is noted. No findings to suggest acute  hemorrhage, acute infarction or space-occupying mass lesion are noted.  IMPRESSION: No acute intracranial abnormality noted.   Electronically Signed   By: Inez Catalina M.D.   On: 03/05/2014 13:39   Mr Brain Wo Contrast  03/05/2014   CLINICAL DATA:  New onset right arm numbness and tingling beginning this morning. Possible stroke.  EXAM: MRI HEAD WITHOUT CONTRAST  TECHNIQUE: Multiplanar, multiecho pulse sequences of the brain and surrounding structures were obtained without intravenous contrast.  COMPARISON:  Head CT 03/05/2014  FINDINGS: There is a 10 mm acute infarct in the left thalamus. There is no evidence of intracranial hemorrhage, mass, midline shift, or extra-axial fluid collection. There is mild generalized cerebral atrophy, within normal limits for age. Patchy T2 hyperintensities in the subcortical and deep cerebral white matter bilaterally are advanced  for age and nonspecific but compatible with moderate chronic small vessel ischemic disease.  Major intracranial vascular flow voids are preserved. A prominent left posterior communicating artery is noted. Orbits are unremarkable. Large mucous retention cysts are present in the maxillary sinuses. There is mild-to-moderate mucosal thickening in the ethmoid air cells bilaterally. A small right mastoid effusion is noted.  IMPRESSION: 1. Acute left thalamic infarct. 2. Moderate chronic small vessel ischemic disease.   Electronically Signed   By: Logan Bores   On: 03/05/2014 14:55     EKG Interpretation   Date/Time:  Friday March 05 2014 12:38:23 EST Ventricular Rate:  80 PR Interval:  137 QRS Duration: 83 QT Interval:  391 QTC Calculation: 451 R Axis:   16 Text Interpretation:  Sinus or ectopic atrial rhythm Baseline wander in  lead(s) II III aVF Confirmed by Mistie Adney  MD, Broadus John ([id number]) on 03/05/2014  3:42:33 PM      MDM   Final diagnoses:  Stroke    Admit to [facility] for stroke workup    Maudry Diego, MD 03/05/14 1542

## 2014-03-05 NOTE — Telephone Encounter (Signed)
[place] [facility] - [place] Call Center Patient Name: Toni Hicks Gender: Female DOB: 1950-06-10 Age: 64 Y 10 M 29 D Return Phone Number: [phone] (Primary) Address: [address] City/State/Zip: [place] [address] Client [place] [facility] - Client Client Site [place] - Day Physician [name], [name] Type Call Call Type Triage / Clinical Relationship To Patient Self Appointment Disposition EMR Appointment Not Necessary Return Phone Number [phone] (Primary) Chief Complaint NUMBNESS - sudden on one side of face or body Initial Comment Caller says that part of her face and her arm started getting numb at [name] she had laid down wrong but it has not gone away PreDisposition [name] pasted into Epic Yes Nurse Assessment Nurse: [name], RN, [name] Date/Time (Eastern Time): 03/05/2014 11:22:12 AM Confirm and document reason for call. If symptomatic, describe symptoms. ---Caller states she is having numbness in the right side of her face on her mouth area, beside her nose, and her cheek. Her mouth is not drooping. She is having numbness in her right arm from her elbow down and she is having tingling in her hand. Symptoms started at 5 am. Has the patient traveled out of the country within the last 30 days? ---Not Applicable Does the patient require triage? ---Yes Related visit to physician within the last 2 weeks? ---No Does the PT have any chronic conditions? (i.e. diabetes, asthma, etc.) ---Yes List chronic conditions. ---HTN Guidelines Guideline Title Affirmed Question Affirmed Notes Nurse Date/Time (Eastern Time) Neurologic Deficit [1] Numbness (i.e., loss of sensation) of the face, arm or leg on one side of the body AND [2] sudden onset AND [3] present now [name], Therapist, sports, [name] 03/05/2014 11:24:37 AM Disp. Time [name] Time) Disposition Final User 03/05/2014  11:20:45 AM Send to Urgent [name], [name] PLEASE NOTE: All timestamps contained within this report are represented as [place] Standard Time. CONFIDENTIALTY NOTICE: This fax transmission is intended only for the addressee. It contains information that is legally privileged, confidential or otherwise protected from use or disclosure. If you are not the intended recipient, you are strictly prohibited from reviewing, disclosing, copying using or disseminating any of this information or taking any action in reliance on or regarding this information. If you have received this fax in error, please notify us immediately by telephone so that we can arrange for its return to [place]. Phone: [phone], Toll-Free: [phone], Fax: [phone] Page: 2 of 2 Call Id: [id number] 03/05/2014 11:31:02 AM Call EMS 911 Now Yes [name], RN, [name] Understands: Yes Disagree/Comply: Disagree Disagree/Comply Reason: Disagree with instructions Care Advice Given Per Guideline CALL EMS 911 NOW: Immediate medical attention is needed. You need to hang up and call 911 (or an ambulance). Psychologist, forensic Discretion: I'll call you back in a few minutes to be sure you were able to reach them.) CARE ADVICE given per Neurologic Deficit (Adult) guideline. After Care Instructions Given Call Event Type User Date / Time Description Comments User: [name], RN Date/Time [name] Time): 03/05/2014 11:41:29 AM Caller states she does not want to call 911 or go to the ER but wants to see the doctor instead. Called back line and spoke to [name] who will call pt back.

## 2014-03-05 NOTE — Telephone Encounter (Signed)
If still having the numbness in face and arm she needs to go to the ER as this could be a stroke which is a medical emergency.

## 2014-03-05 NOTE — Consult Note (Signed)
Referring Physician: Rama    Chief Complaint: Right sided numbness  HPI: [name] is an 64 y.o. female who reports going to bed last night feeling at baseline.  She awakened this morning and felt tingling in her right arm as if she had slept on it wrong.  Later noticed some tingling on the right side of her face and in her leg as well.  Went back to sleep and when she awakened later the sensation was still there.  When she called her PCP she was directed to the ED.   Initial NIHSS of 1.    Date last known well: Date: 03/04/2014 Time last known well: Time: 22:30 tPA Given: No: Outside time window  Past Medical History  Diagnosis Date  . Restless leg syndrome   . Type II or unspecified type diabetes mellitus without mention of complication, not stated as uncontrolled   . Alcohol abuse   . ADD (attention deficit disorder)   . Mycoplasma pneumonia   . Congenital pneumonia   . Hypothyroidism   . Allergic rhinitis   . Hypoglycemia   . Anxiety and depression   . Asthma   . Adult acne     Past Surgical History  Procedure Laterality Date  . Wisdom tooth extraction    . Dilation and curettage of uterus      @ 64 years old    Family History  Problem Relation Age of Onset  . Cancer Mother     breast  . Hypertension Mother   . Dementia Mother   . COPD Father   . Heart failure Father   . Coronary artery disease Father    Social History:  reports that she has quit smoking. She does not have any smokeless tobacco history on file. She reports that she drinks alcohol. She reports that she does not use illicit drugs.  Allergies:  Allergies  Allergen Reactions  . Other     Can not eat carbohydrates without protein, is allergic to certain foods but can take in certain doses    Medications:  I have reviewed the patient's current medications. Prior to Admission:  Prescriptions prior to admission  Medication Sig Dispense Refill Last Dose  . Alpha-D-Galactosidase (BEANO PO)  Take by mouth as needed.     unknown  . b complex vitamins tablet Take 1 tablet by mouth as needed.     Past Week at Unknown time  . desoximetasone (TOPICORT) 0.25 % cream Apply topically as needed. 30 g 3 Past Week at Unknown time  . Fluticasone-Salmeterol (ADVAIR DISKUS) 250-50 MCG/DOSE AEPB Inhale 1 puff into the lungs every 12 (twelve) hours. (Patient taking differently: Inhale 1 puff into the lungs daily. ) 60 each 5 03/05/2014 at Unknown time  . OVER THE COUNTER MEDICATION Take 1 Container by mouth every other day. [place]   03/05/2014 at Unknown time  . VENTOLIN HFA 108 (90 BASE) MCG/ACT inhaler INHALE 2 PUFFS INTO THE LUNGS EVERY 6 HOURS AS NEEDED FOR WHEEZING 18 each 5 03/05/2014 at Unknown time  . zolpidem (AMBIEN) 5 MG tablet Take 1 tablet (5 mg total) by mouth at bedtime as needed for sleep. (Patient taking differently: Take 2.5 mg by mouth at bedtime as needed for sleep. ) 30 tablet 0 03/04/2014 at Unknown time  . nystatin-triamcinolone ointment (MYCOLOG) Apply 1 application topically 2 (two) times daily. (Patient not taking: Reported on 03/05/2014) 60 g 3 Completed Course at Unknown time   Scheduled: . aspirin  300 mg  Rectal Daily   Or  . aspirin  325 mg Oral Daily  . enoxaparin (LOVENOX) injection  40 mg Subcutaneous Q24H  . mometasone-formoterol  2 puff Inhalation BID    ROS: History obtained from the patient  General ROS: difficulty sleeping Psychological ROS: negative for - behavioral disorder, hallucinations, memory difficulties, mood swings or suicidal ideation Ophthalmic ROS: negative for - blurry vision, double vision, eye pain or loss of vision ENT ROS: negative for - epistaxis, nasal discharge, oral lesions, sore throat, tinnitus or vertigo Allergy and Immunology ROS: negative for - hives or itchy/watery eyes Hematological and Lymphatic ROS: negative for - bleeding problems, bruising or swollen lymph nodes Endocrine ROS: negative for - galactorrhea, hair pattern changes,  polydipsia/polyuria or temperature intolerance Respiratory ROS: negative for - cough, hemoptysis, shortness of breath or wheezing Cardiovascular ROS: negative for - chest pain, dyspnea on exertion, edema or irregular heartbeat Gastrointestinal ROS: negative for - abdominal pain, diarrhea, hematemesis, nausea/vomiting or stool incontinence Genito-Urinary ROS: negative for - dysuria, hematuria, incontinence or urinary frequency/urgency Musculoskeletal ROS: back pain Neurological ROS: as noted in HPI Dermatological ROS: rash on arms and face  Physical Examination: Blood pressure 180/104, pulse 80, temperature 99.1 F (37.3 C), temperature source Oral, resp. rate 20, height 5\' 2"  (1.575 m), weight 77.6 kg (171 lb 1.2 oz), SpO2 96 %.  HEENT-  Normocephalic, no lesions, without obvious abnormality.  Normal external eye and conjunctiva.  Normal TM's bilaterally.  Normal auditory canals and external ears. Normal external nose, mucus membranes and septum.  Normal pharynx. Cardiovascular- S1, S2 normal, pulses palpable throughout   Lungs- chest clear, no wheezing, rales, normal symmetric air entry Abdomen- soft, non-tender; bowel sounds normal; no masses,  no organomegaly Extremities- no edema Lymph-no adenopathy palpable Musculoskeletal-no joint tenderness, deformity or swelling Skin-warm and dry, no hyperpigmentation, vitiligo, or suspicious lesions  Neurological Examination Mental Status: Alert, oriented, thought content appropriate.  Speech fluent without evidence of aphasia.  Able to follow 3 step commands without difficulty. Cranial Nerves: II: Discs flat bilaterally; Visual fields grossly normal, pupils equal, round, reactive to light and accommodation III,IV, VI: ptosis not present, extra-ocular motions intact bilaterally V,VII: smile symmetric, facial light touch sensation normal bilaterally VIII: hearing normal bilaterally IX,X: gag reflex present XI: bilateral shoulder shrug XII:  midline tongue extension Motor: Right : Upper extremity   5/5    Left:     Upper extremity   5/5  Lower extremity   5/5     Lower extremity   5/5 Tone and bulk:normal tone throughout; no atrophy noted Sensory: Pinprick and light touch intact throughout, bilaterally Deep Tendon Reflexes: 2+ and symmetric throughout Plantars: Right: downgoing   Left: downgoing Cerebellar: normal finger-to-nose, normal rapid alternating movements and normal heel-to-shin test Gait: normal gait and station      Laboratory Studies:  Basic Metabolic Panel:  Recent Labs Lab 03/05/14 1252  NA 137  K 3.8  CL 102  CO2 24  GLUCOSE 126*  BUN 9  CREATININE 0.61  CALCIUM 9.4    Liver Function Tests:  Recent Labs Lab 03/05/14 1252  AST 88*  ALT 71*  ALKPHOS 94  BILITOT 0.6  PROT 8.0  ALBUMIN 4.7   No results for input(s): LIPASE, AMYLASE in the last 168 hours. No results for input(s): AMMONIA in the last 168 hours.  CBC:  Recent Labs Lab 03/05/14 1252  WBC 6.3  NEUTROABS 3.8  HGB 16.8*  HCT 48.8*  MCV 91.2  PLT 206  Cardiac Enzymes: No results for input(s): CKTOTAL, CKMB, CKMBINDEX, TROPONINI in the last 168 hours.  BNP: Invalid input(s): POCBNP  CBG:  Recent Labs Lab 03/05/14 [place]    Microbiology: No results found for this or any previous visit.  Coagulation Studies:  Recent Labs  03/05/14 1252  LABPROT 13.1  INR 0.98    Urinalysis: No results for input(s): COLORURINE, LABSPEC, PHURINE, GLUCOSEU, HGBUR, BILIRUBINUR, KETONESUR, PROTEINUR, UROBILINOGEN, NITRITE, LEUKOCYTESUR in the last 168 hours.  Invalid input(s): APPERANCEUR  Lipid Panel:    Component Value Date/Time   CHOL 196 12/07/2010 1530   TRIG 192.0* 12/07/2010 1530   HDL 62.90 12/07/2010 1530   CHOLHDL 3 12/07/2010 1530   VLDL 38.4 12/07/2010 1530   LDLCALC 95 12/07/2010 1530    HgbA1C:  Lab Results  Component Value Date   HGBA1C 5.7 12/07/2010    Urine Drug Screen:   No results found for: LABOPIA, COCAINSCRNUR, LABBENZ, AMPHETMU, THCU, LABBARB  Alcohol Level: No results for input(s): ETH in the last 168 hours.  Other results: EKG: 80 bpm.  Imaging: Ct Head (brain) Wo Contrast  03/05/2014   CLINICAL DATA:  Right-sided numbness for 1 day  EXAM: CT HEAD WITHOUT CONTRAST  TECHNIQUE: Contiguous axial images were obtained from the base of the skull through the vertex without intravenous contrast.  COMPARISON:  None.  FINDINGS: Bony calvarium is intact. No gross soft tissue abnormality is noted. No findings to suggest acute hemorrhage, acute infarction or space-occupying mass lesion are noted.  IMPRESSION: No acute intracranial abnormality noted.   Electronically Signed   By: Inez Catalina M.D.   On: 03/05/2014 13:39   Mr Brain Wo Contrast  03/05/2014   CLINICAL DATA:  New onset right arm numbness and tingling beginning this morning. Possible stroke.  EXAM: MRI HEAD WITHOUT CONTRAST  TECHNIQUE: Multiplanar, multiecho pulse sequences of the brain and surrounding structures were obtained without intravenous contrast.  COMPARISON:  Head CT 03/05/2014  FINDINGS: There is a 10 mm acute infarct in the left thalamus. There is no evidence of intracranial hemorrhage, mass, midline shift, or extra-axial fluid collection. There is mild generalized cerebral atrophy, within normal limits for age. Patchy T2 hyperintensities in the subcortical and deep cerebral white matter bilaterally are advanced for age and nonspecific but compatible with moderate chronic small vessel ischemic disease.  Major intracranial vascular flow voids are preserved. A prominent left posterior communicating artery is noted. Orbits are unremarkable. Large mucous retention cysts are present in the maxillary sinuses. There is mild-to-moderate mucosal thickening in the ethmoid air cells bilaterally. A small right mastoid effusion is noted.  IMPRESSION: 1. Acute left thalamic infarct. 2. Moderate chronic small vessel  ischemic disease.   Electronically Signed   By: Logan Bores   On: 03/05/2014 14:55    Assessment: 64 y.o. female presenting with right sided numbness.  MRI of the brain personally reviewed and shows a left thalamic infarct.  On no antiplatelet therapy at home. No vascular risk factors.    Stroke Risk Factors - none  Plan: 1. HgbA1c, fasting lipid panel 2. MRI, MRA  of the brain without contrast 3. PT consult, OT consult, Speech consult 4. Echocardiogram 5. Carotid dopplers 6. Prophylactic therapy-Antiplatelet med: Aspirin - dose 325mg  daily 7. NPO until RN stroke swallow screen 8. Telemetry monitoring 9. Frequent neuro checks   Alexis Goodell, MD [facility] [phone] 03/05/2014, 6:55 PM

## 2014-03-05 NOTE — Telephone Encounter (Signed)
Patient Name: Toni Hicks  DOB: [birth year]    Initial Comment Caller says that part of her face and her arm started getting numb at 5AM. Thought she had laid down wrong but it has not gone away   Nurse Assessment  Nurse: [name], RN, [name] Date/Time (Eastern Time): 03/05/2014 11:22:12 AM  Confirm and document reason for call. If symptomatic, describe symptoms. ---Caller states she is having numbness in the right side of her face on her mouth area, beside her nose, and her cheek. Her mouth is not drooping. She is having numbness in her right arm from her elbow down and she is having tingling in her hand. Symptoms started at 5 am.  Has the patient traveled out of the country within the last 30 days? ---Not Applicable  Does the patient require triage? ---Yes  Related visit to physician within the last 2 weeks? ---No  Does the PT have any chronic conditions? (i.e. diabetes, asthma, etc.) ---Yes  List chronic conditions. ---HTN     Guidelines    Guideline Title Affirmed Question Affirmed Notes  Neurologic Deficit [1] Numbness (i.e., loss of sensation) of the face, arm or leg on one side of the body AND [2] sudden onset AND [3] present now    Final Disposition User   Call EMS 911 Now [name], RN, [place] states she does not want to call 911 or go to the ER but wants to see the doctor instead. Called back line and spoke to [place] who will call pt back.

## 2014-03-05 NOTE — ED Notes (Signed)
Pt c/o waking up w/ R arm tingling and numbness to R side on nose and R side of lips starting around 0600.  Pain score 3/10.  Denies weakness.  Face is symmetric and speech is clear.  Hx of anxiety.

## 2014-03-05 NOTE — Telephone Encounter (Signed)
Left detailed mess informing pt of MD advisement.

## 2014-03-05 NOTE — H&P (Signed)
History and Physical:    Toni Hicks ZOX:[id number] DOB: 20-Jan-1950 DOA: 03/05/2014   Referring physician: Dr. Roderic Palau PCP: Olga Millers, MD   Chief Complaint: Right facial and hand numbness  History of Present Illness:   Toni Hicks is an 64 y.o. female with a PMH DM type II, RLS, ADD, hypothyroidism and ETOH abuse who awoke this morning with the feeling that her right arm had fallen asleep.  Didn't think much of it, but it didn't seem to go away.  She subsequently noted that she had some numbness on the right side of her face.  She noticed that she was a bit clumsy and stuck herself in the eye when trying to apply mascara.  She then went on her inversion table to get blood to her brain, and ate, but the symptoms did not go away, so she called her MD who told her to come to the ER for further evaluation.  The patient drinks 3-4 glasses of wine a day but denies any history of withdrawal symptoms.  Upon initial evaluation, she was found to be hypertensive with a BP of 189/108.  CT of the head was negative but a subsequent MRI showed an acute left thalamic infarct.  ROS:   Constitutional: No fever, no chills;  Appetite diminished; No weight loss, no weight gain, + chronic fatigue.  HEENT: No blurry vision, no diplopia, no pharyngitis, no dysphagia CV: No chest pain, + occasional palpitations, no PND, no orthopnea, no edema.  Resp: + SOB with exertion, no cough, no pleuritic pain. GI: No nausea, no vomiting, no diarrhea, no melena, no hematochezia, no constipation, no abdominal pain.  GU: No dysuria, no hematuria, + frequency, no urgency. MSK: + leg/hip myalgias, + back arthralgias.  Neuro:  No headache, + focal neurological deficits (numbness/clumsiness right side), + ? history of one seizure.  Psych:+ depression, + anxiety.  Endo: No heat intolerance, no cold intolerance, no polyuria, no polydipsia, +hot flashes  Skin: No rashes, no skin lesions.  Heme: No easy bruising.  Travel  history: No recent travel.   Past Medical History:   Past Medical History  Diagnosis Date  . Restless leg syndrome   . Type II or unspecified type diabetes mellitus without mention of complication, not stated as uncontrolled   . Alcohol abuse   . ADD (attention deficit disorder)   . Mycoplasma pneumonia   . Congenital pneumonia   . Hypothyroidism   . Allergic rhinitis   . Hypoglycemia   . Anxiety and depression   . Asthma   . Adult acne     Past Surgical History:   Past Surgical History  Procedure Laterality Date  . Wisdom tooth extraction    . Dilation and curettage of uterus      @ 64 years old    Social History:   History   Social History  . Marital Status: Married    Spouse Name: N/A  . Number of Children: 2  . Years of Education: N/A   Occupational History  . Semi-retired    Social History Main Topics  . Smoking status: Former Research scientist (life sciences)  . Smokeless tobacco: Not on file  . Alcohol Use: 0.0 oz/week    0 Standard drinks or equivalent per week     Comment: 4-5 glasses of wine/day.  . Drug Use: No  . Sexual Activity: Not on file   Other Topics Concern  . Not on file   Social History Narrative  HS[employer], [employer] MA- photography   Married '73- 10 years divorced, married '86- 52 years divorced, Married '96- 53 years- widowed.   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 Cancer Mother     breast  . Hypertension Mother   . Dementia Mother   . COPD Father   . Heart failure Father   . Coronary artery disease Father     Allergies   Other  Current Medications:   Prior to Admission medications   Medication Sig Start Date End Date Taking? Authorizing Provider  Alpha-D-Galactosidase (BEANO PO) Take by mouth as needed.     Yes Historical Provider, MD  b complex vitamins tablet Take 1 tablet by mouth  as needed.     Yes Historical Provider, MD  desoximetasone (TOPICORT) 0.25 % cream Apply topically as needed. 06/12/12  Yes Jearld Fenton, NP  Fluticasone-Salmeterol (ADVAIR DISKUS) 250-50 MCG/DOSE AEPB Inhale 1 puff into the lungs every 12 (twelve) hours. Patient taking differently: Inhale 1 puff into the lungs daily.  12/10/13  Yes Olga Millers, MD  OVER THE COUNTER MEDICATION Take 1 Container by mouth every other day. [place]   Yes Historical Provider, MD  VENTOLIN HFA 108 (90 BASE) MCG/ACT inhaler INHALE 2 PUFFS INTO THE LUNGS EVERY 6 HOURS AS NEEDED FOR WHEEZING 12/14/13  Yes Rowe Clack, MD  zolpidem (AMBIEN) 5 MG tablet Take 1 tablet (5 mg total) by mouth at bedtime as needed for sleep. Patient taking differently: Take 2.5 mg by mouth at bedtime as needed for sleep.  01/20/14  Yes Olga Millers, MD  nystatin-triamcinolone ointment [facility]) Apply 1 application topically 2 (two) times daily. Patient not taking: Reported on 03/05/2014 10/06/13   Olga Millers, MD    Physical Exam:   Filed Vitals:   03/05/14 1317 03/05/14 1500 03/05/14 1501 03/05/14 1530  BP:  187/107 187/107 177/106  Pulse:  81 81 77  Temp: 98.5 F (36.9 C)  98.3 F (36.8 C)   TempSrc:   Oral   Resp:   12 14  SpO2:  96% 97% 98%     Physical Exam: Blood pressure 177/106, pulse 77, temperature 98.3 F (36.8 C), temperature source Oral, resp. rate 14, SpO2 98 %. Gen: No acute distress. Head: Normocephalic, atraumatic. Eyes: PERRL, EOMI, sclerae nonicteric. Mouth: Oropharynx clear with fair dentition.   Neck: Supple, no thyromegaly, no lymphadenopathy, no jugular venous distention. Chest: Lungs CTAB. CV: Heart sounds are regular, no M/R/G. Abdomen: Soft, nontender, nondistended with normal active bowel sounds. Extremities: Extremities are without C/E/C Skin: Warm and dry. Rash noted. Neuro: Alert and oriented times 3; cranial nerves II through XII grossly intact except for facial numbness and  mild tongue deviation to the right.  PEERL/EOMI.  No visual field cuts.  5/5 strength bilaterally with mild right sided pronator drift. Psych: Mood and affect normal.   Data Review:    Labs: Basic Metabolic Panel:  Recent Labs Lab 03/05/14 1252  NA 137  K 3.8  CL 102  CO2 24  GLUCOSE 126*  BUN 9  CREATININE 0.61  CALCIUM 9.4   Liver Function Tests:  Recent Labs Lab 03/05/14 1252  AST 88*  ALT 71*  ALKPHOS 94  BILITOT 0.6  PROT 8.0  ALBUMIN 4.7   CBC:  Recent  Labs Lab 03/05/14 1252  WBC 6.3  NEUTROABS 3.8  HGB 16.8*  HCT 48.8*  MCV 91.2  PLT 206   CBG:  Recent Labs Lab 03/05/14 1247  GLUCAP 125*    Radiographic Studies: Ct Head (brain) Wo Contrast  03/05/2014   CLINICAL DATA:  Right-sided numbness for 1 day  EXAM: CT HEAD WITHOUT CONTRAST  TECHNIQUE: Contiguous axial images were obtained from the base of the skull through the vertex without intravenous contrast.  COMPARISON:  None.  FINDINGS: Bony calvarium is intact. No gross soft tissue abnormality is noted. No findings to suggest acute hemorrhage, acute infarction or space-occupying mass lesion are noted.  IMPRESSION: No acute intracranial abnormality noted.   Electronically Signed   By: Inez Catalina M.D.   On: 03/05/2014 13:39   Mr Brain Wo Contrast  03/05/2014   CLINICAL DATA:  New onset right arm numbness and tingling beginning this morning. Possible stroke.  EXAM: MRI HEAD WITHOUT CONTRAST  TECHNIQUE: Multiplanar, multiecho pulse sequences of the brain and surrounding structures were obtained without intravenous contrast.  COMPARISON:  Head CT 03/05/2014  FINDINGS: There is a 10 mm acute infarct in the left thalamus. There is no evidence of intracranial hemorrhage, mass, midline shift, or extra-axial fluid collection. There is mild generalized cerebral atrophy, within normal limits for age. Patchy T2 hyperintensities in the subcortical and deep cerebral white matter bilaterally are advanced for age and  nonspecific but compatible with moderate chronic small vessel ischemic disease.  Major intracranial vascular flow voids are preserved. A prominent left posterior communicating artery is noted. Orbits are unremarkable. Large mucous retention cysts are present in the maxillary sinuses. There is mild-to-moderate mucosal thickening in the ethmoid air cells bilaterally. A small right mastoid effusion is noted.  IMPRESSION: 1. Acute left thalamic infarct. 2. Moderate chronic small vessel ischemic disease.   Electronically Signed   By: Logan Bores   On: 03/05/2014 14:55    EKG: Independently reviewed. Sinus rhythm at 80 bpm.  No ischemic changes.   Assessment/Plan:   Principal Problem:   Acute CVA (cerebrovascular accident)  Admit to telemetry.  Stroke service consulted.  Not a candidate for TPA: Awoke with symptoms/duration of deficit unknown.  CT head negative.  MRI brain shows acute left thalamic stroke.  Aspirin daily.  Check FLP, hemoglobin A1c.  Check 2 D Echocardiogram and carotid dopplers.  RN to perform stroke swallowing screen and if patient passes, place diet order.  Neuro checks Q 2 hours x 12 hours, then Q 4 hours.  PT/OT evaluations.  Active Problems:   Pre-diabetes  Diet controlled.  Check hemoglobin A1c.    Alcohol abuse  No history of withdrawal.  If she develops any signs of withdrawal, order CIWA.    Essential hypertension  Do not lower BP in the acute phase of stroke, but initiate therapy in 48 hours.    Asthma  Continue Advair, BDs as needed.    DVT prophylaxis  Lovenox ordered.  Code Status: Full. Family Communication: Son Domenic Schwab [phone] is emergency contact. Disposition Plan: Home when stable.  Time spent: 1 hour.  RAMA,CHRISTINA [facility] Pager [phone] Cell: [phone]   If 7PM-7AM, please contact night-coverage [email or web address] Password [facility] 03/05/2014, 4:17 PM

## 2014-03-06 DIAGNOSIS — I6789 Other cerebrovascular disease: Secondary | ICD-10-CM

## 2014-03-06 LAB — LIPID PANEL
CHOLESTEROL: 178 mg/dL (ref 0–200)
HDL: 52 mg/dL (ref >39)
LDL Cholesterol: 97 mg/dL (ref 0–99)
TRIGLYCERIDES: 146 mg/dL (ref <150)
Total CHOL/HDL Ratio: 3.4 RATIO
VLDL: 29 mg/dL (ref 0–40)

## 2014-03-06 LAB — GLUCOSE, CAPILLARY
Glucose-Capillary: 123 mg/dL — ABNORMAL HIGH (ref 70–99)
Glucose-Capillary: 116 mg/dL — ABNORMAL HIGH (ref 70–99)

## 2014-03-06 MED ORDER — NONFORMULARY OR COMPOUNDED ITEM: Status: DC

## 2014-03-06 MED ORDER — FLUOCINONIDE 0.05 % EX CREA: TOPICAL_CREAM | Freq: Three times a day (TID) | CUTANEOUS | Status: DC | PRN

## 2014-03-06 MED ORDER — ATORVASTATIN CALCIUM 20 MG PO TABS: 20.0000 mg | ORAL_TABLET | Freq: Every day | ORAL | Status: DC

## 2014-03-06 MED ORDER — ATORVASTATIN CALCIUM 10 MG PO TABS: 20.0000 mg | ORAL_TABLET | Freq: Every day | ORAL | Status: DC

## 2014-03-06 MED ORDER — AMLODIPINE BESYLATE 5 MG PO TABS: 5.0000 mg | ORAL_TABLET | Freq: Every day | ORAL | Status: DC

## 2014-03-06 MED ORDER — ZOLPIDEM TARTRATE 5 MG PO TABS
2.5000 mg | ORAL_TABLET | Freq: Once | ORAL | Status: AC
  Administered 2014-03-06: 2.5 mg via ORAL
  Filled 2014-03-06: qty 1

## 2014-03-06 MED ORDER — MOMETASONE FURO-FORMOTEROL FUM 100-5 MCG/ACT IN AERO: 2.0000 | INHALATION_SPRAY | Freq: Two times a day (BID) | RESPIRATORY_TRACT | Status: DC

## 2014-03-06 MED ORDER — ASPIRIN 325 MG PO TABS: 325.0000 mg | ORAL_TABLET | Freq: Every day | ORAL | Status: DC

## 2014-03-06 NOTE — Progress Notes (Signed)
STROKE TEAM PROGRESS NOTE   HISTORY Toni Hicks is a 64 y.o. female who reports going to bed last night feeling at baseline. She awakened this morning and felt tingling in her right arm as if she had slept on it wrong. Later noticed some tingling on the right side of her face and in her leg as well. Went back to sleep and when she awakened later the sensation was still there. When she called her PCP she was directed to the ED.  Initial NIHSS of 1.   Date last known well: Date: 03/04/2014 Time last known well: Time: 22:30 tPA Given: No: Outside time window   SUBJECTIVE (INTERVAL HISTORY) No family members are present. The patient still has some numbness but it appears to be improving.   OBJECTIVE Temp:  [97.7 F (36.5 C)-99.1 F (37.3 C)] 98.4 F (36.9 C) (02/27 0445) Pulse Rate:  [71-90] 72 (02/27 0445) Cardiac Rhythm:  [-] Normal sinus rhythm (02/26 2000) Resp:  [12-26] 20 (02/27 0445) BP: (135-189)/(81-108) 154/81 mmHg (02/27 0445) SpO2:  [94 %-99 %] 94 % (02/27 0445) Weight:  [77.6 kg (171 lb 1.2 oz)] 77.6 kg (171 lb 1.2 oz) (02/26 1803)   Recent Labs Lab 03/05/14 1247 03/05/14 2206 03/06/14 0700  GLUCAP 125* 137* 123*    Recent Labs Lab 03/05/14 1252 03/05/14 2056  NA 137  --   K 3.8  --   CL 102  --   CO2 24  --   GLUCOSE 126*  --   BUN 9  --   CREATININE 0.61 0.63  CALCIUM 9.4  --     Recent Labs Lab 03/05/14 1252  AST 88*  ALT 71*  ALKPHOS 94  BILITOT 0.6  PROT 8.0  ALBUMIN 4.7    Recent Labs Lab 03/05/14 1252 03/05/14 2056  WBC 6.3 6.9  NEUTROABS 3.8  --   HGB 16.8* 16.1*  HCT 48.8* 47.1*  MCV 91.2 89.2  PLT 206 202   No results for input(s): CKTOTAL, CKMB, CKMBINDEX, TROPONINI in the last 168 hours.  Recent Labs  03/05/14 1252  LABPROT 13.1  INR 0.98   No results for input(s): COLORURINE, LABSPEC, PHURINE, GLUCOSEU, HGBUR, BILIRUBINUR, KETONESUR, PROTEINUR, UROBILINOGEN, NITRITE, LEUKOCYTESUR in the last 72  hours.  Invalid input(s): APPERANCEUR     Component Value Date/Time   CHOL 178 03/06/2014 0603   TRIG 146 03/06/2014 0603   HDL 52 03/06/2014 0603   CHOLHDL 3.4 03/06/2014 0603   VLDL 29 03/06/2014 0603   LDLCALC 97 03/06/2014 0603   Lab Results  Component Value Date   HGBA1C 5.7 12/07/2010   No results found for: LABOPIA, COCAINSCRNUR, LABBENZ, AMPHETMU, THCU, LABBARB  No results for input(s): ETH in the last 168 hours.  Ct Head (brain) Wo Contrast 03/05/2014    No acute intracranial abnormality noted.    Mr Brain Wo Contrast 03/05/2014    1. Acute left thalamic infarct. 2. Moderate chronic small vessel ischemic disease.      PHYSICAL EXAM Mental Status: Alert, oriented, thought content appropriate. Speech fluent without evidence of aphasia. Able to follow 3 step commands without difficulty. Cranial Nerves: II: Visual fields grossly normal, pupils equal, round, reactive to light and accommodation III,IV, VI: ptosis not present, extra-ocular motions intact bilaterally V,VII: smile symmetric, facial light touch sensation normal bilaterally VIII: hearing normal bilaterally IX,X: gag reflex present XI: bilateral shoulder shrug XII: midline tongue extension Motor: Right :Upper extremity 5/5Left: Upper extremity 5/5 Lower extremity 5/5Lower extremity 5/5 Tone  and bulk:normal tone throughout; no atrophy noted Sensory: Light touch intact throughout, bilaterally Deep Tendon Reflexes: 2+ and symmetric throughout Plantars: Right: downgoingLeft: downgoing Cerebellar: normal finger-to-nose Gait: Deferred  ASSESSMENT/PLAN Toni Hicks is a 64 y.o. female with history of diabetes mellitus, alcohol abuse, anxiety and depression presenting with right-sided tingling. She did not receive IV t-PA due to late presentation.  Stroke:  Dominant - left thalamic infarct secondary to small vessel disease.  Resultant  mild right-sided numbness  MRI as above  MRA  not performed  Carotid Doppler  1-39% ICA stenosis. Vertebral artery flow is antegrade.   2D Echo  pending  LDL 97  HgbA1c pending  Lovenox for VTE prophylaxis  Diet Carb Modified with thin liquids  no antithrombotic prior to admission, now on aspirin 325 mg orally every day  Ongoing aggressive stroke risk factor management  Therapy recommendations: Pending  Disposition: Pending    Hyperlipidemia  Home meds:  No lipid lowering medications prior to admission.  LDL 97, goal < 70  Add Lipitor 20 mg daily  Continue statin at discharge  Diabetes  HgbA1c pending, goal < 7.0  Controlled  Other Stroke Risk Factors  Quit smoking years ago.  ETOH use ( 4-5 glasses of wine per day )  Obesity, Body mass index is 31.28 kg/(m^2).    Other Active Problems  Mildly elevated liver function tests  Other Pertinent History   PLAN  Continue aspirin 325 mg daily  Continue Lipitor 20 mg daily  Await hemoglobin A1c, therapy evaluations, and 2-D echo.  Discharge from workup is complete  Follow-up Dr. Leonie Man in 2 months  Hospital day # [facility] Pager [phone] 03/06/2014, 9:07 AM  Pt has been seen and examined along with the practitioner. Note reviewed. Imaging and laboratory work reviewed.  Stroke up as above d/c planning today or tomorrow AM Leotis Pain    To contact Stroke Continuity provider, please refer to [email or web address] After hours, contact General Neurology

## 2014-03-06 NOTE — Progress Notes (Signed)
  Echocardiogram 2D Echocardiogram has been performed.  Avanell Shackleton M 03/06/2014, 12:47 PM

## 2014-03-06 NOTE — Discharge Summary (Signed)
Physician Discharge Summary  Toni Hicks KGU:[id number] DOB: 03/02/1950 DOA: 03/05/2014  PCP: Olga Millers, MD  Admit date: 03/05/2014 Discharge date: 03/06/2014  Time spent: *45  minutes  Recommendations for Outpatient Follow-up:  1. Follow up Dr Leonie Man in 2 months  Discharge Diagnoses:  Principal Problem:   Acute CVA (cerebrovascular accident) Active Problems:   Pre-diabetes   Alcohol abuse   Essential hypertension   Asthma   Discharge Condition: Stable  Diet recommendation: Low fat diet  Filed Weights   03/05/14 1803  Weight: 77.6 kg (171 lb 1.2 oz)    History of present illness:  64 y.o. female with a PMH DM type II, RLS, ADD, hypothyroidism and ETOH abuse who awoke this morning with the feeling that her right arm had fallen asleep. Didn't think much of it, but it didn't seem to go away. She subsequently noted that she had some numbness on the right side of her face. She noticed that she was a bit clumsy and stuck herself in the eye when trying to apply mascara. She then went on her inversion table to get blood to her brain, and ate, but the symptoms did not go away, so she called her MD who told her to come to the ER for further evaluation. The patient drinks 3-4 glasses of wine a day but denies any history of withdrawal symptoms. Upon initial evaluation, she was found to be hypertensive with a BP of 189/108. CT of the head was negative but a subsequent MRI showed an acute left thalamic infarct  Hospital Course:   Acute left thalamic infarct- Echo , carotid dopplers were done which did not show significant abnormality.Neurology has seen the patient and recommend to discharge home on aspirin 325 mg po daily. She will need outpatient OT 3 x week Also continue Lipitor 20 mg po daily    Essential hypertension  Will start Amlodipine 5 mg po daily, patient will start taking after two days on Monday.   Asthma  Continue Dulera, BDs as  needed   Procedures:  Echo  Carotid duplex  Consultations:  Neuro  Discharge Exam: Filed Vitals:   03/06/14 1437  BP: 163/96  Pulse: 79  Temp: 98.7 F (37.1 C)  Resp: 18    General: Appear in no acute distress Cardiovascular: S1s2 RRR Respiratory: Clear bilaterally  Discharge Instructions   Discharge Instructions    Diet - low sodium heart healthy    Complete by:  As directed      Increase activity slowly    Complete by:  As directed           Current Discharge Medication List    START taking these medications   Details  aspirin 325 MG tablet Take 1 tablet (325 mg total) by mouth daily. Qty: 30 tablet, Refills: 2    atorvastatin (LIPITOR) 20 MG tablet Take 1 tablet (20 mg total) by mouth daily at 6 PM. Qty: 30 tablet, Refills: 2    NONFORMULARY OR COMPOUNDED ITEM Oupt OT 3 times/week for Vision Loss - CVA Qty: 1 each, Refills: 0      CONTINUE these medications which have NOT CHANGED   Details  Alpha-D-Galactosidase (BEANO PO) Take by mouth as needed.      b complex vitamins tablet Take 1 tablet by mouth as needed.      desoximetasone (TOPICORT) 0.25 % cream Apply topically as needed. Qty: 30 g, Refills: 3    Fluticasone-Salmeterol (ADVAIR DISKUS) 250-50 MCG/DOSE AEPB Inhale 1 puff  into the lungs every 12 (twelve) hours. Qty: 60 each, Refills: 5    OVER THE COUNTER MEDICATION Take 1 Container by mouth every other day. Keifer    VENTOLIN HFA 108 (90 BASE) MCG/ACT inhaler INHALE 2 PUFFS INTO THE LUNGS EVERY 6 HOURS AS NEEDED FOR WHEEZING Qty: 18 each, Refills: 5    zolpidem (AMBIEN) 5 MG tablet Take 1 tablet (5 mg total) by mouth at bedtime as needed for sleep. Qty: 30 tablet, Refills: 0    nystatin-triamcinolone ointment (MYCOLOG) Apply 1 application topically 2 (two) times daily. Qty: 60 g, Refills: 3       Allergies  Allergen Reactions  . Other     Can not eat carbohydrates without protein, is allergic to certain foods but can take in  certain doses      The results of significant diagnostics from this hospitalization (including imaging, microbiology, ancillary and laboratory) are listed below for reference.    Significant Diagnostic Studies: Ct Head (brain) Wo Contrast  03/05/2014   CLINICAL DATA:  Right-sided numbness for 1 day  EXAM: CT HEAD WITHOUT CONTRAST  TECHNIQUE: Contiguous axial images were obtained from the base of the skull through the vertex without intravenous contrast.  COMPARISON:  None.  FINDINGS: Bony calvarium is intact. No gross soft tissue abnormality is noted. No findings to suggest acute hemorrhage, acute infarction or space-occupying mass lesion are noted.  IMPRESSION: No acute intracranial abnormality noted.   Electronically Signed   By: Inez Catalina M.D.   On: 03/05/2014 13:39   Mr Brain Wo Contrast  03/05/2014   CLINICAL DATA:  New onset right arm numbness and tingling beginning this morning. Possible stroke.  EXAM: MRI HEAD WITHOUT CONTRAST  TECHNIQUE: Multiplanar, multiecho pulse sequences of the brain and surrounding structures were obtained without intravenous contrast.  COMPARISON:  Head CT 03/05/2014  FINDINGS: There is a 10 mm acute infarct in the left thalamus. There is no evidence of intracranial hemorrhage, mass, midline shift, or extra-axial fluid collection. There is mild generalized cerebral atrophy, within normal limits for age. Patchy T2 hyperintensities in the subcortical and deep cerebral white matter bilaterally are advanced for age and nonspecific but compatible with moderate chronic small vessel ischemic disease.  Major intracranial vascular flow voids are preserved. A prominent left posterior communicating artery is noted. Orbits are unremarkable. Large mucous retention cysts are present in the maxillary sinuses. There is mild-to-moderate mucosal thickening in the ethmoid air cells bilaterally. A small right mastoid effusion is noted.  IMPRESSION: 1. Acute left thalamic infarct. 2.  Moderate chronic small vessel ischemic disease.   Electronically Signed   By: Logan Bores   On: 03/05/2014 14:55    Microbiology: No results found for this or any previous visit (from the past 240 hour(s)).   Labs: Basic Metabolic Panel:  Recent Labs Lab 03/05/14 1252 03/05/14 2056  NA 137  --   K 3.8  --   CL 102  --   CO2 24  --   GLUCOSE 126*  --   BUN 9  --   CREATININE 0.61 0.63  CALCIUM 9.4  --    Liver Function Tests:  Recent Labs Lab 03/05/14 1252  AST 88*  ALT 71*  ALKPHOS 94  BILITOT 0.6  PROT 8.0  ALBUMIN 4.7   No results for input(s): LIPASE, AMYLASE in the last 168 hours. No results for input(s): AMMONIA in the last 168 hours. CBC:  Recent Labs Lab 03/05/14 1252 03/05/14 2056  WBC  6.3 6.9  NEUTROABS 3.8  --   HGB 16.8* 16.1*  HCT 48.8* 47.1*  MCV 91.2 89.2  PLT 206 202   Cardiac Enzymes: No results for input(s): CKTOTAL, CKMB, CKMBINDEX, TROPONINI in the last 168 hours. BNP: BNP (last 3 results) No results for input(s): BNP in the last 8760 hours.  ProBNP (last 3 results) No results for input(s): PROBNP in the last 8760 hours.  CBG:  Recent Labs Lab 03/05/14 1247 03/05/14 2206 03/06/14 0700 03/06/14 1216  GLUCAP 125* 137* 123* 116*       Signed:  Tallin Hart S  [facility] 03/06/2014, 4:30 PM

## 2014-03-06 NOTE — Progress Notes (Signed)
PT Cancellation Note  Patient Details Name: Toni Hicks MRN: [id number] DOB: 1950/10/31   Cancelled Treatment:    Reason Eval/Treat Not Completed: PT screened, no needs identified, will sign off  Patient denies difficulty with mobility.  Declined practice on stairs to simulate entering her home.  Discussed mobility with OT and nurse. Patient reports she is at baseline level of performance.  [place], [place] 03/06/2014, 4:43 PM

## 2014-03-06 NOTE — Progress Notes (Signed)
Patient Toni Hicks home via car with friend.  DC instructions and prescription information was given and fully understood.  Vital signs and assessments were stable.

## 2014-03-06 NOTE — Progress Notes (Signed)
Pt did not have med at scheduled time. Notified Pharmacy for med. Notified RN that med was not available and would come back when med was available.

## 2014-03-06 NOTE — Progress Notes (Signed)
VASCULAR LAB PRELIMINARY  PRELIMINARY  PRELIMINARY  PRELIMINARY  Carotid Dopplers completed.    Preliminary report:  1-369% ICA stenosis.  Vertebral artery flow is antegrade.   Carsyn Taubman, RVT 03/06/2014, 2:27 PM
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
## 2014-03-06 NOTE — Progress Notes (Signed)
SLP Cancellation Note  Patient Details Name: ANGELA VAZGUEZ MRN: [id number] DOB: [birth year]   Cancelled treatment:       Reason Eval/Treat Not Completed: SLP screened, no needs identified, will sign off   Juan Quam Laurice 03/06/2014, 10:51 AM

## 2014-03-08 LAB — HEMOGLOBIN A1C
HEMOGLOBIN A1C: 5.7 % — AB (ref 4.8–5.6)
Mean Plasma Glucose: 117 mg/dL

## 2014-03-08 NOTE — Progress Notes (Signed)
UR COMPLETED  

## 2014-03-09 ENCOUNTER — Other Ambulatory Visit: Payer: Self-pay | Admitting: Internal Medicine

## 2014-03-11 ENCOUNTER — Ambulatory Visit (INDEPENDENT_AMBULATORY_CARE_PROVIDER_SITE_OTHER): Payer: BLUE CROSS/BLUE SHIELD | Admitting: Internal Medicine

## 2014-03-11 ENCOUNTER — Encounter: Payer: Self-pay | Admitting: Internal Medicine

## 2014-03-11 VITALS — BP 140/82 | HR 76 | Temp 99.1°F | Resp 20 | Ht 62.0 in | Wt 167.0 lb

## 2014-03-11 DIAGNOSIS — J4530 Mild persistent asthma, uncomplicated: Secondary | ICD-10-CM

## 2014-03-11 DIAGNOSIS — I639 Cerebral infarction, unspecified: Secondary | ICD-10-CM

## 2014-03-11 MED ORDER — ZOLPIDEM TARTRATE 5 MG PO TABS: 5.0000 mg | ORAL_TABLET | Freq: Every evening | ORAL | Status: DC | PRN

## 2014-03-11 MED ORDER — PREDNISONE 10 MG PO TABS: ORAL_TABLET | ORAL | Status: DC

## 2014-03-11 NOTE — Assessment & Plan Note (Signed)
Advised to continue taking the statin and aspirin daily. No new symptoms since returning home. Advised that some recovery can happen in the first 6 months after stroke.

## 2014-03-11 NOTE — Patient Instructions (Addendum)
We will give you some prednisone to take for the breathing. Take 5 pills today, then 4 pills tomorrow, then 3 pills the next day, then 2 pills the following day, then 1 pill the following day, then stop.   If you are not able to work through the pain with physical therapy and chiropractor let us know and we can send you to a doctor for joints.   Continue working on exercise to help your body get stronger. You should be exercising about 6 times per week for 45-60 minutes per time for weight loss.   Some effects of stroke can take up to 6 months to heal fully and you may notice that the numbness gets a little but better.  Ischemic Stroke A stroke (cerebrovascular accident) is the sudden death of brain tissue. It is a medical emergency. A stroke can cause permanent loss of brain function. This can cause problems with different parts of your body. A transient ischemic attack (TIA) is different because it does not cause permanent damage. A TIA is a short-lived problem of poor blood flow affecting a part of the brain. A TIA is also a serious problem because having a TIA greatly increases the chances of having a stroke. When symptoms first develop, you cannot know if the problem might be a stroke or a TIA. CAUSES  A stroke is caused by a decrease of oxygen supply to an area of your brain. It is usually the result of a small blood clot or collection of cholesterol or fat (plaque) that blocks blood flow in the brain. A stroke can also be caused by blocked or damaged carotid arteries.  RISK FACTORS  High blood pressure (hypertension).  High cholesterol.  Diabetes mellitus.  Heart disease.  The buildup of plaque in the blood vessels (peripheral artery disease or atherosclerosis).  The buildup of plaque in the blood vessels providing blood and oxygen to the brain (carotid artery stenosis).  An abnormal heart rhythm (atrial fibrillation).  Obesity.  Smoking.  Taking oral contraceptives (especially  in combination with smoking).  Physical inactivity.  A diet high in fats, salt (sodium), and calories.  Alcohol use.  Use of illegal drugs (especially cocaine and methamphetamine).  Being African American.  Being over the age of 28.  Family history of stroke.  Previous history of blood clots, stroke, TIA, or heart attack.  Sickle cell disease. SYMPTOMS  These symptoms usually develop suddenly, or may be newly present upon awakening from sleep:  Sudden weakness or numbness of the face, arm, or leg, especially on one side of the body.  Sudden trouble walking or difficulty moving arms or legs.  Sudden confusion.  Sudden personality changes.  Trouble speaking (aphasia) or understanding.  Difficulty swallowing.  Sudden trouble seeing in one or both eyes.  Double vision.  Dizziness.  Loss of balance or coordination.  Sudden severe headache with no known cause.  Trouble reading or writing. DIAGNOSIS  Your health care provider can often determine the presence or absence of a stroke based on your symptoms, history, and physical exam. Computed tomography (CT) of the brain is usually performed to confirm the stroke, determine causes, and determine stroke severity. Other tests may be done to find the cause of the stroke. These tests may include:  Electrocardiography.  Continuous heart monitoring.  Echocardiography.  Carotid ultrasonography.  Magnetic resonance imaging (MRI).  A scan of the brain circulation.  Blood tests. PREVENTION  The risk of a stroke can be decreased by appropriately  treating high blood pressure, high cholesterol, diabetes, heart disease, and obesity and by quitting smoking, limiting alcohol, and staying physically active. TREATMENT  Time is of the essence. It is important to seek treatment at the first sign of these symptoms because you may receive a medicine to dissolve the clot (thrombolytic) that cannot be given if too much time has passed  since your symptoms began. Even if you do not know when your symptoms began, get treatment as soon as possible as there are other treatment options available including oxygen, intravenous (IV) fluids, and medicines to thin the blood (anticoagulants). Treatment of stroke depends on the duration, severity, and cause of your symptoms. Medicines and dietary changes may be used to address diabetes, high blood pressure, and other risk factors. Physical, speech, and occupational therapists will assess you and work with you to improve any functions impaired by the stroke. Measures will be taken to prevent short-term and long-term complications, including infection from breathing foreign material into the lungs (aspiration pneumonia), blood clots in the legs, bedsores, and falls. Rarely, surgery may be needed to remove large blood clots or to open up blocked arteries. HOME CARE INSTRUCTIONS   Take medicines only as directed by your health care provider. Follow the directions carefully. Medicines may be used to control risk factors for a stroke. Be sure you understand all your medicine instructions.  You may be told to take a medicine to thin the blood, such as aspirin or the anticoagulant warfarin. Warfarin needs to be taken exactly as instructed.  Too much and too little warfarin are both dangerous. Too much warfarin increases the risk of bleeding. Too little warfarin continues to allow the risk for blood clots. While taking warfarin, you will need to have regular blood tests to measure your blood clotting time. These blood tests usually include both the PT and INR tests. The PT and INR results allow your health care provider to adjust your dose of warfarin. The dose can change for many reasons. It is critically important that you take warfarin exactly as prescribed, and that you have your PT and INR levels drawn exactly as directed.  Many foods, especially foods high in vitamin K, can interfere with warfarin and  affect the PT and INR results. Foods high in vitamin K include spinach, kale, broccoli, cabbage, collard and turnip greens, brussels sprouts, peas, cauliflower, seaweed, and parsley, as well as beef and pork liver, green tea, and soybean oil. You should eat a consistent amount of foods high in vitamin K. Avoid major changes in your diet, or notify your health care provider before changing your diet. Arrange a visit with a dietitian to answer your questions.  Many medicines can interfere with warfarin and affect the PT and INR results. You must tell your health care provider about any and all medicines you take. This includes all vitamins and supplements. Be especially cautious with aspirin and anti-inflammatory medicines. Do not take or discontinue any prescribed or over-the-counter medicine except on the advice of your health care provider or pharmacist.  Warfarin can have side effects, such as excessive bruising or bleeding. You will need to hold pressure over cuts for longer than usual. Your health care provider or pharmacist will discuss other potential side effects.  Avoid sports or activities that may cause injury or bleeding.  Be mindful when shaving, flossing your teeth, or handling sharp objects.  Alcohol can change the body's ability to handle warfarin. It is best to avoid alcoholic drinks or consume  only very small amounts while taking warfarin. Notify your health care provider if you change your alcohol intake.  Notify your dentist or other health care providers before procedures.  If swallow studies have determined that your swallowing reflex is present, you should eat healthy foods. Including 5 or more servings of fruits and vegetables a day may reduce the risk of stroke. Foods may need to be a certain consistency (soft or pureed), or small bites may need to be taken in order to avoid aspirating or choking. Certain dietary changes may be advised to address high blood pressure, high  cholesterol, diabetes, or obesity.  Food choices that are low in sodium, saturated fat, trans fat, and cholesterol are recommended to manage high blood pressure.  Food choies that are high in fiber, and low in saturated fat, trans fat, and cholesterol may control cholesterol levels.  Controlling carbohydrates and sugar intake is recommended to manage diabetes.  Reducing calorie intake and making food choices that are low in sodium, saturated fat, trans fat, and cholesterol are recommended to manage obesity.  Maintain a healthy weight.  Stay physically active. It is recommended that you get at least 30 minutes of activity on all or most days.  Do not use any tobacco products including cigarettes, chewing tobacco, or electronic cigarettes.  Limit alcohol use even if you are not taking warfarin. Moderate alcohol use is considered to be:  No more than 2 drinks each day for men.  No more than 1 drink each day for nonpregnant women.  Home safety. A safe home environment is important to reduce the risk of falls. Your health care provider may arrange for specialists to evaluate your home. Having grab bars in the bedroom and bathroom is often important. Your health care provider may arrange for equipment to be used at home, such as raised toilets and a seat for the shower.  Physical, occupational, and speech therapy. Ongoing therapy may be needed to maximize your recovery after a stroke. If you have been advised to use a walker or a cane, use it at all times. Be sure to keep your therapy appointments.  Follow all instructions for follow-up with your health care provider. This is very important. This includes any referrals, physical therapy, rehabilitation, and lab tests. Proper follow-up can prevent another stroke from occurring. SEEK MEDICAL CARE IF:  You have personality changes.  You have difficulty swallowing.  You are seeing double.  You have dizziness.  You have a fever.  You have  skin breakdown. SEEK IMMEDIATE MEDICAL CARE IF:  Any of these symptoms may represent a serious problem that is an emergency. Do not wait to see if the symptoms will go away. Get medical help right away. Call your local emergency services (911 in [place] Do not drive yourself to the hospital.  You have sudden weakness or numbness of the face, arm, or leg, especially on one side of the body.  You have sudden trouble walking or difficulty moving arms or legs.  You have sudden confusion.  You have trouble speaking (aphasia) or understanding.  You have sudden trouble seeing in one or both eyes.  You have a loss of balance or coordination.  You have a sudden, severe headache with no known cause.  You have new chest pain or an irregular heartbeat.  You have a partial or total loss of consciousness. Document Released: 12/25/2004 Document Revised: 05/11/2013 Document Reviewed: 08/05/2011 [facility] Patient Information 2015 [place], [place]. This information is not intended to replace  advice given to you by your health care provider. Make sure you discuss any questions you have with your health care provider.  

## 2014-03-11 NOTE — Assessment & Plan Note (Signed)
Advised to take prednisone for early flare no indication for antibiotics. Advised to start taking her allergy medication as well to help out.

## 2014-03-11 NOTE — Progress Notes (Signed)
Pre visit review using our clinic review tool, if applicable. No additional management support is needed unless otherwise documented below in the visit note. 

## 2014-03-11 NOTE — Progress Notes (Signed)
   Subjective:    Patient ID: Toni Hicks, female    DOB: 1950/10/03, 64 y.o.   MRN: [id number]  HPI The patient is a 64 YO female who is coming in today for hospital follow up (in hospital for acute stroke, no etiology found, no atrial fibrillation). She is still having residual numbness in her right arm. No speech changes, weakness. She is still working with OT at home. No falls since being home. No other changes to her medicine although she is not sure why she was started on a cholesterol medication. She is having some nasal congestion with cough and sputum production. She is worried it will set off her asthma.   Review of Systems  Constitutional: Negative for fever, activity change, appetite change and fatigue.  HENT: Positive for congestion, postnasal drip and rhinorrhea.   Eyes: Negative.   Respiratory: Positive for cough. Negative for chest tightness and wheezing.        Sometimes at night.  Cardiovascular: Negative for chest pain, palpitations and leg swelling.  Gastrointestinal: Negative for abdominal pain, diarrhea, constipation and abdominal distention.  Endocrine: Negative.   Genitourinary: Negative.   Musculoskeletal: Negative.   Neurological: Positive for numbness. Negative for dizziness, facial asymmetry, speech difficulty, weakness, light-headedness and headaches.      Objective:   Physical Exam  Constitutional: She is oriented to person, place, and time. She appears well-developed and well-nourished.  Overweight   HENT:  Head: Normocephalic and atraumatic.  Nose with crusting.   Eyes: EOM are normal.  Neck: Normal range of motion.  Cardiovascular: Normal rate and regular rhythm.   Pulmonary/Chest: Effort normal and breath sounds normal. No respiratory distress. She has no wheezes. She has no rales.  Some bronchitic noises.   Abdominal: Soft. Bowel sounds are normal. She exhibits no distension. There is no tenderness. There is no rebound.  Neurological: She is  alert and oriented to person, place, and time. A cranial nerve deficit is present. Coordination normal.  Some decreased sensation to fine touch on the right arm and several fingers.   Skin: Skin is warm and dry.  Vitals reviewed.  Filed Vitals:   03/11/14 1434  BP: 140/82  Pulse: 76  Temp: 99.1 F (37.3 C)  TempSrc: Oral  Resp: 20  Height: 5\' 2"  (1.575 m)  Weight: 167 lb (75.751 kg)  SpO2: 94%      Assessment & Plan:  Visit time 25 minutes, greater than 50% of which was spent in face to face counseling.

## 2014-05-14 ENCOUNTER — Encounter: Payer: Self-pay | Admitting: Neurology

## 2014-05-14 ENCOUNTER — Ambulatory Visit (INDEPENDENT_AMBULATORY_CARE_PROVIDER_SITE_OTHER): Payer: BLUE CROSS/BLUE SHIELD | Admitting: Neurology

## 2014-05-14 VITALS — BP 136/84 | HR 79 | Ht 62.0 in | Wt 169.0 lb

## 2014-05-14 DIAGNOSIS — I10 Essential (primary) hypertension: Secondary | ICD-10-CM | POA: Diagnosis not present

## 2014-05-14 NOTE — Progress Notes (Signed)
PATIENT: Toni Hicks DOB: January 23, 1950  HISTORICAL  Toni Hicks is a 64 yo RH female, alone at visit, she is referred by her PCP is Dr.Kollar to follow-up on her stroke  She had a history of long-term at least moderate alcohol use, few years history of abnormal glucose level, improved by diet control, recent diagnosis of hypertension, hyperlipidemia  She woke up March 05 2014, notice right arm numbness, later also notice right arm clumsiness, numbness spreading to her right face, right leg, she was admitted to the hospital, MRI of the brain showed acute left thalamic stroke, also mild to moderate periventricular white matter disease  Echocardiogram was normal. ultrasound of carotid artery showed less than 39% stenosis bilateral internal carotid artery, anterograde flow of vertebral artery  LDL was 97, A1c was 5.7, she was discharged home with amlodipine, Lipitor, aspirin 325 mg  She has chronic low back pain, radiating pain to left lower extremity, mild gait difficulty due to intermittent pain,   She is now almost back to her baseline, with mild residual right hand intermittent paresthesia,    REVIEW OF SYSTEMS: Full 14 system review of systems performed and notable only for weight gain, palpitation, fatigue, blurry vision, shortness of breath, cough, wheezing, snoring, feeling hot, flushing, joint pain, achy muscles, allergy, runny nose, skin sensitivity, memory loss, confusion, numbness, weakness, insomnia, restless legs, depression, anxiety, not enough sleep, decreased energy   ALLERGIES: Allergies  Allergen Reactions  . Other     Can not eat carbohydrates without protein, is allergic to certain foods but can take in certain doses    HOME MEDICATIONS: Current Outpatient Prescriptions  Medication Sig Dispense Refill  . ADVAIR DISKUS 250-50 MCG/DOSE AEPB Inhale 1 puff into the lungs.   5  . amLODipine (NORVASC) 5 MG tablet Take 1 tablet (5 mg total) by mouth daily. 30  tablet 3  . aspirin 325 MG tablet Take 1 tablet (325 mg total) by mouth daily. 30 tablet 2  . atorvastatin (LIPITOR) 20 MG tablet Take 1 tablet (20 mg total) by mouth daily at 6 PM. 30 tablet 2  . b complex vitamins tablet Take 1 tablet by mouth as needed.      . fluocinonide cream (LIDEX) 0.05 % Apply topically 3 (three) times daily as needed (skin irritation). 30 g 0  . OVER THE COUNTER MEDICATION Take 1 Container by mouth every other day. Keifer    . VENTOLIN HFA 108 (90 BASE) MCG/ACT inhaler INHALE 2 PUFFS INTO THE LUNGS EVERY 6 HOURS AS NEEDED FOR WHEEZING 18 each 5  . zolpidem (AMBIEN) 5 MG tablet Take 1 tablet (5 mg total) by mouth at bedtime as needed for sleep. 30 tablet 3   No current facility-administered medications for this visit.    PAST MEDICAL HISTORY: Past Medical History  Diagnosis Date  . Restless leg syndrome   . Type II or unspecified type diabetes mellitus without mention of complication, not stated as uncontrolled   . Alcohol abuse   . ADD (attention deficit disorder)   . Mycoplasma pneumonia   . Congenital pneumonia   . Hypothyroidism   . Allergic rhinitis   . Hypoglycemia   . Anxiety and depression   . Asthma   . Adult acne   . Stroke     PAST SURGICAL HISTORY: Past Surgical History  Procedure Laterality Date  . Wisdom tooth extraction    . Dilation and curettage of uterus      @ 64  years old    FAMILY HISTORY: Family History  Problem Relation Age of Onset  . Cancer Mother     breast  . Hypertension Mother   . Dementia Mother   . COPD Father   . Heart failure Father   . Coronary artery disease Father   . Stroke Mother     SOCIAL HISTORY:  History   Social History  . Marital Status: Married    Spouse Name: N/A  . Number of Children: 2  . Years of Education: 17   Occupational History  . General Contractor     Semi-retired   Social History Main Topics  . Smoking status: Former Research scientist (life sciences)  . Smokeless tobacco: Not on file  . Alcohol  Use: 0.0 oz/week    0 Standard drinks or equivalent per week     Comment: 4-5 glasses of wine/day.  . Drug Use: No  . Sexual Activity: Not on file   Other Topics Concern  . Not on file   Social History Narrative   HSG, [employer] [place] MA- photography   Married '73- 10 years divorced, married '86- 66 years divorced, Married '96- 36 years- widowed.   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   Right-handed.   1-2 cups caffeine per day.        PHYSICAL EXAM   Filed Vitals:   05/14/14 0822  BP: 136/84  Pulse: 79  Height: 5\' 2"  (1.575 m)  Weight: 169 lb (76.658 kg)    Not recorded      Body mass index is 30.9 kg/(m^2).  PHYSICAL EXAMNIATION:  Gen: NAD, conversant, well nourised, obese, well groomed                     Cardiovascular: Regular rate rhythm, no peripheral edema, warm, nontender. Eyes: Conjunctivae clear without exudates or hemorrhage Neck: Supple, no carotid bruise. Pulmonary: Clear to auscultation bilaterally   NEUROLOGICAL EXAM:  MENTAL STATUS: Speech:    Speech is normal; fluent and spontaneous with normal comprehension.  Cognition:    The patient is oriented to person, place, and time;     recent and remote memory intact;     language fluent;     normal attention, concentration,     fund of knowledge.  CRANIAL NERVES: CN II: Visual fields are full to confrontation. Fundoscopic exam is normal with sharp discs and no vascular changes. Venous pulsations are present bilaterally. Pupils are 4 mm and briskly reactive to light. Visual acuity is 20/20 bilaterally. CN III, IV, VI: extraocular movement are normal. No ptosis. CN V: Facial sensation is intact to pinprick in all 3 divisions bilaterally. Corneal responses are intact.  CN VII: Face is symmetric with normal eye closure and smile. CN VIII: Hearing is normal to rubbing fingers CN IX, X: Palate  elevates symmetrically. Phonation is normal. CN XI: Head turning and shoulder shrug are intact CN XII: Tongue is midline with normal movements and no atrophy.  MOTOR: There is no pronator drift of out-stretched arms. Muscle bulk and tone are normal. Muscle strength is normal.   Shoulder abduction Shoulder external rotation Elbow flexion Elbow extension Wrist flexion Wrist extension Finger abduction Hip flexion Knee flexion Knee extension Ankle dorsi flexion Ankle plantar flexion  R 5 5 5 5 5 5 5 5 5 5 5 5   L 5 5 5 5 5 5 5  5  5 5 5 5     REFLEXES: Reflexes are 2+ and symmetric at the biceps, triceps, knees, and ankles. Plantar responses are flexor.  SENSORY:  she has mildly decreased light touch at right face, arm, leg   COORDINATION: Rapid alternating movements and fine finger movements are intact. There is no dysmetria on finger-to-nose and heel-knee-shin. There are no abnormal or extraneous movements.   GAIT/STANCE: Posture is normal.  mild antalgic gait, she is able to walk on heels, and toes, mild difficulty with tandem walking Romberg is absent.   DIAGNOSTIC DATA (LABS, IMAGING, TESTING) - I reviewed patient records, labs, notes, testing and imaging myself where available.  Lab Results  Component Value Date   WBC 6.9 03/05/2014   HGB 16.1* 03/05/2014   HCT 47.1* 03/05/2014   MCV 89.2 03/05/2014   PLT 202 03/05/2014      Component Value Date/Time   NA 137 03/05/2014 1252   K 3.8 03/05/2014 1252   CL 102 03/05/2014 1252   CO2 24 03/05/2014 1252   GLUCOSE 126* 03/05/2014 1252   BUN 9 03/05/2014 1252   CREATININE 0.63 03/05/2014 2056   CALCIUM 9.4 03/05/2014 1252   PROT 8.0 03/05/2014 1252   ALBUMIN 4.7 03/05/2014 1252   AST 88* 03/05/2014 1252   ALT 71* 03/05/2014 1252   ALKPHOS 94 03/05/2014 1252   BILITOT 0.6 03/05/2014 1252   GFRNONAA >90 03/05/2014 2056   GFRAA >90 03/05/2014 2056   Lab Results  Component Value Date   CHOL 178 03/06/2014   HDL 52  03/06/2014   LDLCALC 97 03/06/2014   TRIG 146 03/06/2014   CHOLHDL 3.4 03/06/2014   Lab Results  Component Value Date   HGBA1C 5.7* 03/06/2014   Lab Results  Component Value Date   PPJKDTOI71 245 12/07/2010   Lab Results  Component Value Date   TSH 1.03 12/07/2010      ASSESSMENT AND PLAN  Toni Hicks is a 64 y.o. female   with vascular risk factor of hypertension, hyperlipidemia, prediabetes, alcohol use, presented with acute left thalamic stroke small vessel disease, in February 2016, now almost recovered to her baseline,   Continue to address vascular risk factor, optimize control of hypertension, hyperlipidemia,  I have advised her lower daily alcohol use Keep aspirin 325 mg daily  Only return to clinic for new issues    Marcial Pacas, M.D. Ph.D.  [facility] [address], [place], [place] [address] Ph: [phone] Fax: [phone]

## 2014-06-11 ENCOUNTER — Telehealth: Payer: Self-pay | Admitting: Internal Medicine

## 2014-06-11 ENCOUNTER — Other Ambulatory Visit: Payer: Self-pay

## 2014-06-11 MED ORDER — ALBUTEROL SULFATE HFA 108 (90 BASE) MCG/ACT IN AERS: 2.0000 | INHALATION_SPRAY | Freq: Four times a day (QID) | RESPIRATORY_TRACT | Status: DC | PRN

## 2014-06-11 NOTE — Telephone Encounter (Signed)
Patient is requesting refill for VENTOLIN HFA 108 (90 BASE) MCG/ACT inhaler [[id number]] . Pharmacy sent request over 2 days ago. [place] is leaving the country on [place] and need the prescription sent over before end of day today.

## 2014-06-14 ENCOUNTER — Other Ambulatory Visit: Payer: Self-pay

## 2014-06-14 ENCOUNTER — Other Ambulatory Visit: Payer: Self-pay | Admitting: Geriatric Medicine

## 2014-06-14 MED ORDER — ALBUTEROL SULFATE HFA 108 (90 BASE) MCG/ACT IN AERS: 2.0000 | INHALATION_SPRAY | Freq: Four times a day (QID) | RESPIRATORY_TRACT | Status: DC | PRN

## 2014-06-14 NOTE — Telephone Encounter (Signed)
Sent to pharmacy 

## 2014-06-25 ENCOUNTER — Telehealth: Payer: Self-pay | Admitting: Emergency Medicine

## 2014-06-25 NOTE — Telephone Encounter (Signed)
LVM for pt to call and update mammogram records.

## 2014-07-02 ENCOUNTER — Other Ambulatory Visit: Payer: Self-pay | Admitting: Internal Medicine

## 2014-07-05 ENCOUNTER — Other Ambulatory Visit: Payer: Self-pay

## 2014-08-05 ENCOUNTER — Telehealth: Payer: Self-pay | Admitting: Internal Medicine

## 2014-08-05 MED ORDER — ZOLPIDEM TARTRATE 5 MG PO TABS: 5.0000 mg | ORAL_TABLET | Freq: Every evening | ORAL | Status: DC | PRN

## 2014-08-05 NOTE — Telephone Encounter (Signed)
Refilled,  please fax 

## 2014-08-05 NOTE — Telephone Encounter (Signed)
Verified pharmacy. Patient is requesting a refill of zolpidem (AMBIEN) 5 MG tablet [[id number]].

## 2014-08-05 NOTE — Telephone Encounter (Signed)
Faxed to [facility]

## 2014-08-05 NOTE — Telephone Encounter (Signed)
Please advise, thanks.

## 2014-09-01 ENCOUNTER — Other Ambulatory Visit: Payer: Self-pay | Admitting: Internal Medicine

## 2014-10-05 ENCOUNTER — Other Ambulatory Visit: Payer: Self-pay | Admitting: Internal Medicine

## 2014-11-01 ENCOUNTER — Other Ambulatory Visit: Payer: Self-pay | Admitting: Internal Medicine

## 2014-11-03 ENCOUNTER — Other Ambulatory Visit: Payer: Self-pay | Admitting: Internal Medicine

## 2014-11-16 ENCOUNTER — Other Ambulatory Visit: Payer: Self-pay | Admitting: Internal Medicine

## 2014-12-03 ENCOUNTER — Other Ambulatory Visit: Payer: Self-pay | Admitting: Internal Medicine

## 2014-12-12 ENCOUNTER — Other Ambulatory Visit: Payer: Self-pay | Admitting: Internal Medicine

## 2014-12-14 ENCOUNTER — Encounter: Payer: Self-pay | Admitting: Internal Medicine

## 2014-12-14 ENCOUNTER — Other Ambulatory Visit (INDEPENDENT_AMBULATORY_CARE_PROVIDER_SITE_OTHER): Payer: BLUE CROSS/BLUE SHIELD

## 2014-12-14 ENCOUNTER — Ambulatory Visit (INDEPENDENT_AMBULATORY_CARE_PROVIDER_SITE_OTHER): Payer: BLUE CROSS/BLUE SHIELD | Admitting: Internal Medicine

## 2014-12-14 VITALS — BP 120/60 | HR 88 | Temp 98.7°F | Resp 18 | Ht 62.0 in | Wt 164.0 lb

## 2014-12-14 DIAGNOSIS — Z8673 Personal history of transient ischemic attack (TIA), and cerebral infarction without residual deficits: Secondary | ICD-10-CM

## 2014-12-14 DIAGNOSIS — J4520 Mild intermittent asthma, uncomplicated: Secondary | ICD-10-CM | POA: Diagnosis not present

## 2014-12-14 DIAGNOSIS — R7303 Prediabetes: Secondary | ICD-10-CM | POA: Diagnosis not present

## 2014-12-14 DIAGNOSIS — I10 Essential (primary) hypertension: Secondary | ICD-10-CM | POA: Diagnosis not present

## 2014-12-14 LAB — COMPREHENSIVE METABOLIC PANEL WITH GFR
ALT: 36 U/L — ABNORMAL HIGH (ref 0–35)
AST: 34 U/L (ref 0–37)
Albumin: 4.6 g/dL (ref 3.5–5.2)
Alkaline Phosphatase: 86 U/L (ref 39–117)
BUN: 10 mg/dL (ref 6–23)
CO2: 29 meq/L (ref 19–32)
Calcium: 9.7 mg/dL (ref 8.4–10.5)
Chloride: 102 meq/L (ref 96–112)
Creatinine, Ser: 0.74 mg/dL (ref 0.40–1.20)
GFR: 84.06 mL/min (ref >60.00)
Glucose, Bld: 97 mg/dL (ref 70–99)
Potassium: 4.4 meq/L (ref 3.5–5.1)
Sodium: 140 meq/L (ref 135–145)
Total Bilirubin: 0.4 mg/dL (ref 0.2–1.2)
Total Protein: 7.7 g/dL (ref 6.0–8.3)

## 2014-12-14 LAB — LIPID PANEL
CHOL/HDL RATIO: 2
Cholesterol: 122 mg/dL (ref 0–200)
HDL: 57.4 mg/dL (ref >39.00)
LDL CALC: 47 mg/dL (ref 0–99)
NonHDL: 64.64
TRIGLYCERIDES: 86 mg/dL (ref 0.0–149.0)
VLDL: 17.2 mg/dL (ref 0.0–40.0)

## 2014-12-14 LAB — HEMOGLOBIN A1C: HEMOGLOBIN A1C: 5.7 % (ref 4.6–6.5)

## 2014-12-14 MED ORDER — FLUOCINONIDE 0.05 % EX CREA: TOPICAL_CREAM | Freq: Three times a day (TID) | CUTANEOUS | Status: DC | PRN

## 2014-12-14 MED ORDER — NYSTATIN-TRIAMCINOLONE 100000-0.1 UNIT/GM-% EX OINT: TOPICAL_OINTMENT | CUTANEOUS | Status: DC

## 2014-12-14 MED ORDER — ALBUTEROL SULFATE HFA 108 (90 BASE) MCG/ACT IN AERS: INHALATION_SPRAY | RESPIRATORY_TRACT | Status: DC

## 2014-12-14 MED ORDER — AMLODIPINE BESYLATE 5 MG PO TABS: 2.5000 mg | ORAL_TABLET | Freq: Every day | ORAL | Status: DC

## 2014-12-14 MED ORDER — ZOLPIDEM TARTRATE 5 MG PO TABS: 5.0000 mg | ORAL_TABLET | Freq: Every evening | ORAL | Status: DC | PRN

## 2014-12-14 MED ORDER — ATORVASTATIN CALCIUM 20 MG PO TABS: ORAL_TABLET | ORAL | Status: DC

## 2014-12-14 NOTE — Progress Notes (Signed)
   Subjective:    Patient ID: Toni Hicks, female    DOB: 12-13-1950, 64 y.o.   MRN: [id number]  HPI The patient is a 64 YO female coming in for follow up of her medical problems including: old CVA (symptoms stable some numbness in right hand and leg, no new symptoms), blood pressure (taking amlodipine daily, some dizziness, not complicated), her asthma (doing well with advair and rare albuterol, some SOB with heavy exertion, no flare in the last 1 year). No new concerns. Working on her health and lost weight since last visit and given up carbs.   Review of Systems  Constitutional: Positive for activity change. Negative for fever, appetite change and fatigue.       Exercising regularly  HENT: Negative for congestion, postnasal drip, rhinorrhea and sinus pressure.   Eyes: Negative.   Respiratory: Negative for cough, chest tightness and wheezing.        Sometimes at night.  Cardiovascular: Negative for chest pain, palpitations and leg swelling.  Gastrointestinal: Negative for abdominal pain, diarrhea, constipation and abdominal distention.  Endocrine: Negative.   Genitourinary: Negative.   Musculoskeletal: Negative.   Neurological: Positive for numbness. Negative for dizziness, facial asymmetry, speech difficulty, weakness, light-headedness and headaches.      Objective:   Physical Exam  Constitutional: She is oriented to person, place, and time. She appears well-developed and well-nourished.  Overweight   HENT:  Head: Normocephalic and atraumatic.  Eyes: EOM are normal.  Neck: Normal range of motion.  Cardiovascular: Normal rate and regular rhythm.   Pulmonary/Chest: Effort normal and breath sounds normal. No respiratory distress. She has no wheezes. She has no rales.  Some bronchitic noises at the bases.   Abdominal: Soft. Bowel sounds are normal. She exhibits no distension. There is no tenderness. There is no rebound.  Neurological: She is alert and oriented to person, place,  and time. A cranial nerve deficit is present. Coordination normal.  Some decreased sensation to fine touch on the right arm and several fingers.   Skin: Skin is warm and dry.  Vitals reviewed.  Filed Vitals:   12/14/14 1322  BP: 120/60  Pulse: 88  Temp: 98.7 F (37.1 C)  TempSrc: Oral  Resp: 18  Height: 5\' 2"  (1.575 m)  Weight: 164 lb (74.39 kg)  SpO2: 96%      Assessment & Plan:

## 2014-12-14 NOTE — Patient Instructions (Signed)
You can take 1/2 pill of the amlodipine (for blood pressure) and the lipitor (for cholesterol).   We are checking the labs today and will call you back with the results.   Health Maintenance, Female Adopting a healthy lifestyle and getting preventive care can go a long way to promote health and wellness. Talk with your health care provider about what schedule of regular examinations is right for you. This is a good chance for you to check in with your provider about disease prevention and staying healthy. In between checkups, there are plenty of things you can do on your own. Experts have done a lot of research about which lifestyle changes and preventive measures are most likely to keep you healthy. Ask your health care provider for more information. WEIGHT AND DIET  Eat a healthy diet  Be sure to include plenty of vegetables, fruits, low-fat dairy products, and lean protein.  Do not eat a lot of foods high in solid fats, added sugars, or salt.  Get regular exercise. This is one of the most important things you can do for your health.  Most adults should exercise for at least 150 minutes each week. The exercise should increase your heart rate and make you sweat (moderate-intensity exercise).  Most adults should also do strengthening exercises at least twice a week. This is in addition to the moderate-intensity exercise.  Maintain a healthy weight  Body mass index (BMI) is a measurement that can be used to identify possible weight problems. It estimates body fat based on height and weight. Your health care provider can help determine your BMI and help you achieve or maintain a healthy weight.  For females 21 years of age and older:   A BMI below 18.5 is considered underweight.  A BMI of 18.5 to 24.9 is normal.  A BMI of 25 to 29.9 is considered overweight.  A BMI of 30 and above is considered obese.  Watch levels of cholesterol and blood lipids  You should start having your blood  tested for lipids and cholesterol at 63 years of age, then have this test every 5 years.  You may need to have your cholesterol levels checked more often if:  Your lipid or cholesterol levels are high.  You are older than 63 years of age.  You are at high risk for heart disease.  CANCER SCREENING   Lung Cancer  Lung cancer screening is recommended for adults 55-46 years old who are at high risk for lung cancer because of a history of smoking.  A yearly low-dose CT scan of the lungs is recommended for people who:  Currently smoke.  Have quit within the past 15 years.  Have at least a 30-pack-year history of smoking. A pack year is smoking an average of one pack of cigarettes a day for 1 year.  Yearly screening should continue until it has been 15 years since you quit.  Yearly screening should stop if you develop a health problem that would prevent you from having lung cancer treatment.  Breast Cancer  Practice breast self-awareness. This means understanding how your breasts normally appear and feel.  It also means doing regular breast self-exams. Let your health care provider know about any changes, no matter how small.  If you are in your 20s or 30s, you should have a clinical breast exam (CBE) by a health care provider every 1-3 years as part of a regular health exam.  If you are 31 or older, have a  CBE every year. Also consider having a breast X-ray (mammogram) every year.  If you have a family history of breast cancer, talk to your health care provider about genetic screening.  If you are at high risk for breast cancer, talk to your health care provider about having an MRI and a mammogram every year.  Breast cancer gene (BRCA) assessment is recommended for women who have family members with BRCA-related cancers. BRCA-related cancers include:  Breast.  Ovarian.  Tubal.  Peritoneal cancers.  Results of the assessment will determine the need for genetic counseling  and BRCA1 and BRCA2 testing. Cervical Cancer Your health care provider may recommend that you be screened regularly for cancer of the pelvic organs (ovaries, uterus, and vagina). This screening involves a pelvic examination, including checking for microscopic changes to the surface of your cervix (Pap test). You may be encouraged to have this screening done every 3 years, beginning at age 41.  For women ages 36-65, health care providers may recommend pelvic exams and Pap testing every 3 years, or they may recommend the Pap and pelvic exam, combined with testing for human papilloma virus (HPV), every 5 years. Some types of HPV increase your risk of cervical cancer. Testing for HPV may also be done on women of any age with unclear Pap test results.  Other health care providers may not recommend any screening for nonpregnant women who are considered low risk for pelvic cancer and who do not have symptoms. Ask your health care provider if a screening pelvic exam is right for you.  If you have had past treatment for cervical cancer or a condition that could lead to cancer, you need Pap tests and screening for cancer for at least 20 years after your treatment. If Pap tests have been discontinued, your risk factors (such as having a new sexual partner) need to be reassessed to determine if screening should resume. Some women have medical problems that increase the chance of getting cervical cancer. In these cases, your health care provider may recommend more frequent screening and Pap tests. Colorectal Cancer  This type of cancer can be detected and often prevented.  Routine colorectal cancer screening usually begins at 64 years of age and continues through 63 years of age.  Your health care provider may recommend screening at an earlier age if you have risk factors for colon cancer.  Your health care provider may also recommend using home test kits to check for hidden blood in the stool.  A small camera  at the end of a tube can be used to examine your colon directly (sigmoidoscopy or colonoscopy). This is done to check for the earliest forms of colorectal cancer.  Routine screening usually begins at age 52.  Direct examination of the colon should be repeated every 5-10 years through 63 years of age. However, you may need to be screened more often if early forms of precancerous polyps or small growths are found. Skin Cancer  Check your skin from head to toe regularly.  Tell your health care provider about any new moles or changes in moles, especially if there is a change in a mole's shape or color.  Also tell your health care provider if you have a mole that is larger than the size of a pencil eraser.  Always use sunscreen. Apply sunscreen liberally and repeatedly throughout the day.  Protect yourself by wearing long sleeves, pants, a wide-brimmed hat, and sunglasses whenever you are outside. HEART DISEASE, DIABETES, AND HIGH BLOOD  PRESSURE   High blood pressure causes heart disease and increases the risk of stroke. High blood pressure is more likely to develop in:  People who have blood pressure in the high end of the normal range (130-139/85-89 mm Hg).  People who are overweight or obese.  People who are African American.  If you are 18-39 years of age, have your blood pressure checked every 3-5 years. If you are 40 years of age or older, have your blood pressure checked every year. You should have your blood pressure measured twice--once when you are at a hospital or clinic, and once when you are not at a hospital or clinic. Record the average of the two measurements. To check your blood pressure when you are not at a hospital or clinic, you can use:  An automated blood pressure machine at a pharmacy.  A home blood pressure monitor.  If you are between 55 years and 79 years old, ask your health care provider if you should take aspirin to prevent strokes.  Have regular diabetes  screenings. This involves taking a blood sample to check your fasting blood sugar level.  If you are at a normal weight and have a low risk for diabetes, have this test once every three years after 63 years of age.  If you are overweight and have a high risk for diabetes, consider being tested at a younger age or more often. PREVENTING INFECTION  Hepatitis B  If you have a higher risk for hepatitis B, you should be screened for this virus. You are considered at high risk for hepatitis B if:  You were born in a country where hepatitis B is common. Ask your health care provider which countries are considered high risk.  Your parents were born in a high-risk country, and you have not been immunized against hepatitis B (hepatitis B vaccine).  You have HIV or AIDS.  You use needles to inject street drugs.  You live with someone who has hepatitis B.  You have had sex with someone who has hepatitis B.  You get hemodialysis treatment.  You take certain medicines for conditions, including cancer, organ transplantation, and autoimmune conditions. Hepatitis C  Blood testing is recommended for:  Everyone born from 1945 through 1965.  Anyone with known risk factors for hepatitis C. Sexually transmitted infections (STIs)  You should be screened for sexually transmitted infections (STIs) including gonorrhea and chlamydia if:  You are sexually active and are younger than 63 years of age.  You are older than 63 years of age and your health care provider tells you that you are at risk for this type of infection.  Your sexual activity has changed since you were last screened and you are at an increased risk for chlamydia or gonorrhea. Ask your health care provider if you are at risk.  If you do not have HIV, but are at risk, it may be recommended that you take a prescription medicine daily to prevent HIV infection. This is called pre-exposure prophylaxis (PrEP). You are considered at risk  if:  You are sexually active and do not regularly use condoms or know the HIV status of your partner(s).  You take drugs by injection.  You are sexually active with a partner who has HIV. Talk with your health care provider about whether you are at high risk of being infected with HIV. If you choose to begin PrEP, you should first be tested for HIV. You should then be tested every 3   months for as long as you are taking PrEP.  PREGNANCY   If you are premenopausal and you may become pregnant, ask your health care provider about preconception counseling.  If you may become pregnant, take 400 to 800 micrograms (mcg) of folic acid every day.  If you want to prevent pregnancy, talk to your health care provider about birth control (contraception). OSTEOPOROSIS AND MENOPAUSE   Osteoporosis is a disease in which the bones lose minerals and strength with aging. This can result in serious bone fractures. Your risk for osteoporosis can be identified using a bone density scan.  If you are 47 years of age or older, or if you are at risk for osteoporosis and fractures, ask your health care provider if you should be screened.  Ask your health care provider whether you should take a calcium or vitamin D supplement to lower your risk for osteoporosis.  Menopause may have certain physical symptoms and risks.  Hormone replacement therapy may reduce some of these symptoms and risks. Talk to your health care provider about whether hormone replacement therapy is right for you.  HOME CARE INSTRUCTIONS   Schedule regular health, dental, and eye exams.  Stay current with your immunizations.   Do not use any tobacco products including cigarettes, chewing tobacco, or electronic cigarettes.  If you are pregnant, do not drink alcohol.  If you are breastfeeding, limit how much and how often you drink alcohol.  Limit alcohol intake to no more than 1 drink per day for nonpregnant women. One drink equals 12  ounces of beer, 5 ounces of wine, or 1 ounces of hard liquor.  Do not use street drugs.  Do not share needles.  Ask your health care provider for help if you need support or information about quitting drugs.  Tell your health care provider if you often feel depressed.  Tell your health care provider if you have ever been abused or do not feel safe at home.   This information is not intended to replace advice given to you by your health care provider. Make sure you discuss any questions you have with your health care provider.   Document Released: 07/10/2010 Document Revised: 01/15/2014 Document Reviewed: 11/26/2012 Elsevier Interactive Patient Education [employer].

## 2014-12-14 NOTE — Assessment & Plan Note (Signed)
Mild intermittent and no flare today. Continue advair, refilled that and albuterol today. No flare in the last 12 months. Allergens are her trigger.

## 2014-12-14 NOTE — Assessment & Plan Note (Signed)
BP at goal and will decrease amlodipine for dizziness. Checking CMP today. Adjust as needed.

## 2014-12-14 NOTE — Assessment & Plan Note (Signed)
Checking HgA1c but given that she has lost weight not likely to have progressed. Last HgA1c 5.7. Exercising regularly.

## 2014-12-14 NOTE — Assessment & Plan Note (Signed)
Still with some numbness on her hand and her shin, no progression of symptoms. Some mild memory deficit as well. No new symptoms and taking statin and BP at goal. Watching sugars as well.

## 2014-12-14 NOTE — Progress Notes (Signed)
Pre visit review using our clinic review tool, if applicable. No additional management support is needed unless otherwise documented below in the visit note. 

## 2015-01-11 ENCOUNTER — Other Ambulatory Visit: Payer: Self-pay | Admitting: Geriatric Medicine

## 2015-01-11 ENCOUNTER — Telehealth: Payer: Self-pay | Admitting: Internal Medicine

## 2015-01-11 MED ORDER — FLUOCINONIDE 0.05 % EX CREA: TOPICAL_CREAM | Freq: Three times a day (TID) | CUTANEOUS | Status: DC | PRN

## 2015-01-11 MED ORDER — ALBUTEROL SULFATE HFA 108 (90 BASE) MCG/ACT IN AERS: INHALATION_SPRAY | RESPIRATORY_TRACT | Status: DC

## 2015-01-11 MED ORDER — AMLODIPINE BESYLATE 5 MG PO TABS: 2.5000 mg | ORAL_TABLET | Freq: Every day | ORAL | Status: DC

## 2015-01-11 MED ORDER — FLUTICASONE-SALMETEROL 250-50 MCG/DOSE IN AEPB: INHALATION_SPRAY | RESPIRATORY_TRACT | Status: DC

## 2015-01-11 MED ORDER — ATORVASTATIN CALCIUM 20 MG PO TABS: ORAL_TABLET | ORAL | Status: DC

## 2015-01-11 NOTE — Telephone Encounter (Signed)
Patient was in last week and there was a discussion regarding getting all her medications to 90 day supplies.  Things didn't work out with the pharmacy as she hoped. She is hoping you can call her since she says you were in the room when this was being discussed. She can be reached at [phone]

## 2015-01-11 NOTE — Telephone Encounter (Signed)
Spoke to patient and sent in refills.

## 2015-02-04 ENCOUNTER — Other Ambulatory Visit: Payer: Self-pay | Admitting: Internal Medicine

## 2015-02-12 ENCOUNTER — Other Ambulatory Visit: Payer: Self-pay | Admitting: Internal Medicine

## 2015-04-06 ENCOUNTER — Other Ambulatory Visit: Payer: Self-pay | Admitting: Internal Medicine

## 2015-07-19 ENCOUNTER — Other Ambulatory Visit: Payer: Self-pay | Admitting: *Deleted

## 2015-07-19 MED ORDER — ALBUTEROL SULFATE HFA 108 (90 BASE) MCG/ACT IN AERS: INHALATION_SPRAY | RESPIRATORY_TRACT | Status: DC

## 2015-09-15 ENCOUNTER — Other Ambulatory Visit: Payer: Self-pay | Admitting: Internal Medicine

## 2015-10-08 ENCOUNTER — Other Ambulatory Visit: Payer: Self-pay | Admitting: Internal Medicine

## 2015-10-10 NOTE — Telephone Encounter (Signed)
Sent to pharmacy 

## 2015-11-10 ENCOUNTER — Other Ambulatory Visit: Payer: Self-pay | Admitting: Internal Medicine

## 2015-12-02 ENCOUNTER — Other Ambulatory Visit: Payer: Self-pay | Admitting: Internal Medicine

## 2015-12-15 ENCOUNTER — Ambulatory Visit (INDEPENDENT_AMBULATORY_CARE_PROVIDER_SITE_OTHER): Payer: BLUE CROSS/BLUE SHIELD | Admitting: Internal Medicine

## 2015-12-15 ENCOUNTER — Encounter: Payer: Self-pay | Admitting: Internal Medicine

## 2015-12-15 ENCOUNTER — Other Ambulatory Visit: Payer: BLUE CROSS/BLUE SHIELD

## 2015-12-15 VITALS — BP 142/60 | HR 86 | Temp 98.4°F | Resp 14 | Ht 62.0 in | Wt 163.0 lb

## 2015-12-15 DIAGNOSIS — J4520 Mild intermittent asthma, uncomplicated: Secondary | ICD-10-CM | POA: Diagnosis not present

## 2015-12-15 DIAGNOSIS — Z2911 Encounter for prophylactic immunotherapy for respiratory syncytial virus (RSV): Secondary | ICD-10-CM | POA: Diagnosis not present

## 2015-12-15 DIAGNOSIS — F1010 Alcohol abuse, uncomplicated: Secondary | ICD-10-CM | POA: Diagnosis not present

## 2015-12-15 DIAGNOSIS — Z23 Encounter for immunization: Secondary | ICD-10-CM | POA: Diagnosis not present

## 2015-12-15 DIAGNOSIS — I10 Essential (primary) hypertension: Secondary | ICD-10-CM

## 2015-12-15 DIAGNOSIS — Z0000 Encounter for general adult medical examination without abnormal findings: Secondary | ICD-10-CM

## 2015-12-15 DIAGNOSIS — I69359 Hemiplegia and hemiparesis following cerebral infarction affecting unspecified side: Secondary | ICD-10-CM

## 2015-12-15 NOTE — Progress Notes (Signed)
   Subjective:    Patient ID: Toni Hicks, female    DOB: 11-03-1950, 65 y.o.   MRN: [id number]  HPI The patient is a 65 YO female coming in for wellness with her daughter. She has concerns about her mother's alcohol intake in light of her prior stroke and MRI brain changes.   PMH, [facility], social history reviewed and updated.   Review of Systems  Constitutional: Negative for activity change, appetite change, fatigue, fever and unexpected weight change.  HENT: Negative.   Eyes: Negative.   Respiratory: Negative.   Cardiovascular: Negative.   Gastrointestinal: Negative.   Musculoskeletal: Negative.   Skin: Negative.   Neurological: Positive for numbness. Negative for dizziness, seizures, syncope, facial asymmetry, weakness and headaches.       Some mental slowing  Psychiatric/Behavioral: Negative.       Objective:   Physical Exam  Constitutional: She appears well-developed and well-nourished.  HENT:  Head: Normocephalic and atraumatic.  Eyes: EOM are normal.  Neck: Normal range of motion.  Cardiovascular: Normal rate and regular rhythm.   Pulmonary/Chest: Effort normal. No respiratory distress. She has no wheezes. She has no rales.  Abdominal: Soft. She exhibits no distension. There is no tenderness. There is no rebound.  Musculoskeletal: She exhibits no edema.  Neurological: She is alert. A cranial nerve deficit is present. Coordination normal.  Numbness/abnormal sensation in the right side arms and legs  Skin: Skin is warm and dry.  Psychiatric: She has a normal mood and affect.  Some forgetfulness during exam.    Vitals:   12/15/15 1105  BP: (!) 142/60  Pulse: 86  Resp: 14  Temp: 98.4 F (36.9 C)  TempSrc: Oral  SpO2: 96%  Weight: 163 lb (73.9 kg)  Height: 5\' 2"  (1.575 m)      Assessment & Plan:  Tdap and shingles shot given at visit.

## 2015-12-15 NOTE — Progress Notes (Signed)
Pre visit review using our clinic review tool, if applicable. No additional management support is needed unless otherwise documented below in the visit note. 

## 2015-12-15 NOTE — Patient Instructions (Signed)
We have given you the shingles shot today and the tetanus shot.  It is really important to cut back or cut out the alcohol altogether to help your memory. When you drink a bottle of wine per day this leaves toxins in your brain which are never cleared.   We will get you in with the skin doctor and the neurologist.   Alcohol Use and Nutrition Many people who use alcohol do not eat enough carbohydrates, protein, fat, vitamins, and minerals. This can cause poor nutrition (malnutrition) and a lack of nutrients (nutrient deficiencies), which can lead to further complications. Nutrients that are commonly lacking (deficient) among people who abuse alcohol include:  Vitamins.  Vitamin A. This is stored in your liver. It is important for your vision, metabolism, and ability to fight off infections (immunity).  B vitamins. These include vitamins such as folate, thiamin, and niacin. These are important in new cell growth and maintenance.  Vitamin C. This plays an important role in iron absorption, wound healing, and immunity.  Vitamin D. This is produced by your liver, but you can also get vitamin D from food. Vitamin D is necessary for your body to absorb and use calcium.  Minerals.  Calcium. This is important for your bones and your heart and blood vessel (cardiovascular) function.  Iron. This is important for blood, muscle, and nervous system functioning.  Magnesium. This plays an important role in muscle and nerve function, and it helps to control blood sugar and blood pressure.  Zinc. This is important for the normal function of your nervous system and digestive system (gastrointestinal tract). Nutrition is an essential component of therapy for alcohol abuse. Your health care provider or dietitian will work with you to design a plan that can help restore nutrients to your body and prevent potential complications. What is my plan? Your dietitian may develop a specific diet plan that is based  on your condition and any other complications you may have. A diet plan will commonly include:  A balanced diet.  Grains: 6-8 oz per day.  Vegetables: 2-3 cups per day.  Fruits: 1-2 cups per day.  Meat and other protein: 5-6 oz per day.  Dairy: 2-3 cups per day.  Vitamin and mineral supplements. What do I need to know about alcohol and nutrition?  Consume foods that are high in antioxidants, such as grapes, berries, nuts, green tea, and dark green and orange vegetables. This can help to counteract some of the stress that is placed on your liver by consuming alcohol.  Avoid food and drinks that are high in fat and sugar. Foods such as sugared soft drinks, salty snack foods, and candy contain empty calories. This means that they lack important nutrients such as protein, fiber, and vitamins.  Eat frequent meals and snacks. Try to eat 5-6 small meals each day.  Eat a variety of fresh fruits and vegetables each day. This will help you get plenty of water, fiber, and vitamins in your diet.  Drink plenty of water and other clear fluids. Try to drink at least 48-64 oz (1.5-2 L) of water per day.  If you are a vegetarian, eat a variety of protein-rich foods. Pair whole grains with plant-based proteins at meals and snacks to obtain the greatest nutrient benefit from your food. For example, eat rice with beans, put peanut butter on whole-grain toast, or eat oatmeal with sunflower seeds.  Soak beans and whole grains overnight before cooking. This can help your body to absorb  the nutrients more easily.  Include foods fortified with vitamins and minerals in your diet. Commonly fortified foods include milk, orange juice, cereal, and bread.  If you are malnourished, your dietitian may recommend a high-protein, high-calorie diet. This may include:  2,000-3,000 calories (kilocalories) per day.  70-100 grams of protein per day.  Your health care provider may recommend a complete nutritional  supplement beverage. This can help to restore calories, protein, and vitamins to your body. Depending on your condition, you may be advised to consume this instead of or in addition to meals.  Limit your intake of caffeine. Replace drinks like coffee and black tea with decaffeinated coffee and herbal tea.  Eat a variety of foods that are high in omega fatty acids. These include fish, nuts and seeds, and soybeans. These foods may help your liver to recover and may also stabilize your mood.  Certain medicines may cause changes in your appetite, taste, and weight. Work with your health care provider and dietitian to make any adjustments to your medicines and diet plan.  Include other healthy lifestyle choices in your daily routine.  Be physically active.  Get enough sleep.  Spend time doing activities that you enjoy.  If you are unable to take in enough food and calories by mouth, your health care provider may recommend a feeding tube. This is a tube that passes through your nose and throat, directly into your stomach. Nutritional supplement beverages can be given to you through the feeding tube to help you get the nutrients you need.  Take vitamin or mineral supplements as recommended by your health care provider. What foods can I eat? Grains  Enriched pasta. Enriched rice. Fortified whole-grain bread. Fortified whole-grain cereal. Barley. Brown rice. Quinoa. [place]. Vegetables  All fresh, frozen, and canned vegetables. Spinach. Kale. Artichoke. Carrots. Winter squash and pumpkin. Sweet potatoes. Broccoli. Cabbage. Cucumbers. Tomatoes. Sweet peppers. Green beans. Peas. Corn. Fruits  All fresh and frozen fruits. Berries. Grapes. Mango. Papaya. Guava. Cherries. Apples. Bananas. Peaches. Plums. Pineapple. Watermelon. Cantaloupe. Oranges. Avocado. Meats and Other Protein Sources  Beef liver. Lean beef. Pork. Fresh and canned chicken. Fresh fish. Oysters. Sardines. Canned tuna. Shrimp. Eggs with  yolks. Nuts and seeds. Peanut butter. Beans and lentils. Soybeans. Tofu. Dairy  Whole, low-fat, and nonfat milk. Whole, low-fat, and nonfat yogurt. Cottage cheese. Sour cream. Hard and soft cheeses. Beverages  Water. Herbal tea. Decaffeinated coffee. Decaffeinated green tea. 100% fruit juice. 100% vegetable juice. Instant breakfast shakes. Condiments  Ketchup. Mayonnaise. Mustard. Salad dressing. Barbecue sauce. Sweets and Desserts  Sugar-free ice cream. Sugar-free pudding. Sugar-free gelatin. Fats and Oils  Butter. Vegetable oil, flaxseed oil, olive oil, and walnut oil. Other  Complete nutrition shakes. Protein bars. Sugar-free gum. The items listed above may not be a complete list of recommended foods or beverages. Contact your dietitian for more options.  What foods are not recommended? Grains  Sugar-sweetened breakfast cereals. Flavored instant oatmeal. Fried breads. Vegetables  Breaded or deep-fried vegetables. Fruits  Dried fruit with added sugar. Candied fruit. Canned fruit in syrup. Meats and Other Protein Sources  Breaded or deep-fried meats. Dairy  Flavored milks. Fried cheese curds or fried cheese sticks. Beverages  Alcohol. Sugar-sweetened soft drinks. Sugar-sweetened tea. Caffeinated coffee and tea. Condiments  Sugar. Honey. Agave nectar. Molasses. Sweets and Desserts  Chocolate. Cake. Cookies. Candy. Other  Potato chips. Pretzels. Salted nuts. Candied nuts. The items listed above may not be a complete list of foods and beverages to avoid. Contact your dietitian  for more information.  This information is not intended to replace advice given to you by your health care provider. Make sure you discuss any questions you have with your health care provider. Document Released: 10/19/2004 Document Revised: 05/04/2015 Document Reviewed: 07/28/2013 Elsevier Interactive Patient Education  2017 [place] Maintenance, Female Introduction Adopting a healthy  lifestyle and getting preventive care can go a long way to promote health and wellness. Talk with your health care provider about what schedule of regular examinations is right for you. This is a good chance for you to check in with your provider about disease prevention and staying healthy. In between checkups, there are plenty of things you can do on your own. Experts have done a lot of research about which lifestyle changes and preventive measures are most likely to keep you healthy. Ask your health care provider for more information. Weight and diet Eat a healthy diet  Be sure to include plenty of vegetables, fruits, low-fat dairy products, and lean protein.  Do not eat a lot of foods high in solid fats, added sugars, or salt.  Get regular exercise. This is one of the most important things you can do for your health.  Most adults should exercise for at least 150 minutes each week. The exercise should increase your heart rate and make you sweat (moderate-intensity exercise).  Most adults should also do strengthening exercises at least twice a week. This is in addition to the moderate-intensity exercise. Maintain a healthy weight  Body mass index (BMI) is a measurement that can be used to identify possible weight problems. It estimates body fat based on height and weight. Your health care provider can help determine your BMI and help you achieve or maintain a healthy weight.  For females 61 years of age and older:  A BMI below 18.5 is considered underweight.  A BMI of 18.5 to 24.9 is normal.  A BMI of 25 to 29.9 is considered overweight.  A BMI of 30 and above is considered obese. Watch levels of cholesterol and blood lipids  You should start having your blood tested for lipids and cholesterol at 64 years of age, then have this test every 5 years.  You may need to have your cholesterol levels checked more often if:  Your lipid or cholesterol levels are high.  You are older than  64 years of age.  You are at high risk for heart disease. Cancer screening Lung Cancer  Lung cancer screening is recommended for adults 34-88 years old who are at high risk for lung cancer because of a history of smoking.  A yearly low-dose CT scan of the lungs is recommended for people who:  Currently smoke.  Have quit within the past 15 years.  Have at least a 30-pack-year history of smoking. A pack year is smoking an average of one pack of cigarettes a day for 1 year.  Yearly screening should continue until it has been 15 years since you quit.  Yearly screening should stop if you develop a health problem that would prevent you from having lung cancer treatment. Breast Cancer  Practice breast self-awareness. This means understanding how your breasts normally appear and feel.  It also means doing regular breast self-exams. Let your health care provider know about any changes, no matter how small.  If you are in your 20s or 30s, you should have a clinical breast exam (CBE) by a health care provider every 1-3 years as part of a  regular health exam.  If you are 40 or older, have a CBE every year. Also consider having a breast X-ray (mammogram) every year.  If you have a family history of breast cancer, talk to your health care provider about genetic screening.  If you are at high risk for breast cancer, talk to your health care provider about having an MRI and a mammogram every year.  Breast cancer gene (BRCA) assessment is recommended for women who have family members with BRCA-related cancers. BRCA-related cancers include:  Breast.  Ovarian.  Tubal.  Peritoneal cancers.  Results of the assessment will determine the need for genetic counseling and BRCA1 and BRCA2 testing. Cervical Cancer  Your health care provider may recommend that you be screened regularly for cancer of the pelvic organs (ovaries, uterus, and vagina). This screening involves a pelvic examination,  including checking for microscopic changes to the surface of your cervix (Pap test). You may be encouraged to have this screening done every 3 years, beginning at age 34.  For women ages 60-65, health care providers may recommend pelvic exams and Pap testing every 3 years, or they may recommend the Pap and pelvic exam, combined with testing for human papilloma virus (HPV), every 5 years. Some types of HPV increase your risk of cervical cancer. Testing for HPV may also be done on women of any age with unclear Pap test results.  Other health care providers may not recommend any screening for nonpregnant women who are considered low risk for pelvic cancer and who do not have symptoms. Ask your health care provider if a screening pelvic exam is right for you.  If you have had past treatment for cervical cancer or a condition that could lead to cancer, you need Pap tests and screening for cancer for at least 20 years after your treatment. If Pap tests have been discontinued, your risk factors (such as having a new sexual partner) need to be reassessed to determine if screening should resume. Some women have medical problems that increase the chance of getting cervical cancer. In these cases, your health care provider may recommend more frequent screening and Pap tests. Colorectal Cancer  This type of cancer can be detected and often prevented.  Routine colorectal cancer screening usually begins at 64 years of age and continues through 64 years of age.  Your health care provider may recommend screening at an earlier age if you have risk factors for colon cancer.  Your health care provider may also recommend using home test kits to check for hidden blood in the stool.  A small camera at the end of a tube can be used to examine your colon directly (sigmoidoscopy or colonoscopy). This is done to check for the earliest forms of colorectal cancer.  Routine screening usually begins at age 97.  Direct  examination of the colon should be repeated every 5-10 years through 64 years of age. However, you may need to be screened more often if early forms of precancerous polyps or small growths are found. Skin Cancer  Check your skin from head to toe regularly.  Tell your health care provider about any new moles or changes in moles, especially if there is a change in a mole's shape or color.  Also tell your health care provider if you have a mole that is larger than the size of a pencil eraser.  Always use sunscreen. Apply sunscreen liberally and repeatedly throughout the day.  Protect yourself by wearing long sleeves, pants, a wide-brimmed  hat, and sunglasses whenever you are outside. Heart disease, diabetes, and high blood pressure  High blood pressure causes heart disease and increases the risk of stroke. High blood pressure is more likely to develop in:  People who have blood pressure in the high end of the normal range (130-139/85-89 mm Hg).  People who are overweight or obese.  People who are African American.  If you are 11-33 years of age, have your blood pressure checked every 3-5 years. If you are 2 years of age or older, have your blood pressure checked every year. You should have your blood pressure measured twice-once when you are at a hospital or clinic, and once when you are not at a hospital or clinic. Record the average of the two measurements. To check your blood pressure when you are not at a hospital or clinic, you can use:  An automated blood pressure machine at a pharmacy.  A home blood pressure monitor.  If you are between 18 years and 48 years old, ask your health care provider if you should take aspirin to prevent strokes.  Have regular diabetes screenings. This involves taking a blood sample to check your fasting blood sugar level.  If you are at a normal weight and have a low risk for diabetes, have this test once every three years after 64 years of age.  If  you are overweight and have a high risk for diabetes, consider being tested at a younger age or more often. Preventing infection Hepatitis B  If you have a higher risk for hepatitis B, you should be screened for this virus. You are considered at high risk for hepatitis B if:  You were born in a country where hepatitis B is common. Ask your health care provider which countries are considered high risk.  Your parents were born in a high-risk country, and you have not been immunized against hepatitis B (hepatitis B vaccine).  You have HIV or AIDS.  You use needles to inject street drugs.  You live with someone who has hepatitis B.  You have had sex with someone who has hepatitis B.  You get hemodialysis treatment.  You take certain medicines for conditions, including cancer, organ transplantation, and autoimmune conditions. Hepatitis C  Blood testing is recommended for:  Everyone born from [birth year] through 1964.  Anyone with known risk factors for hepatitis C. Sexually transmitted infections (STIs)  You should be screened for sexually transmitted infections (STIs) including gonorrhea and chlamydia if:  You are sexually active and are younger than 64 years of age.  You are older than 64 years of age and your health care provider tells you that you are at risk for this type of infection.  Your sexual activity has changed since you were last screened and you are at an increased risk for chlamydia or gonorrhea. Ask your health care provider if you are at risk.  If you do not have HIV, but are at risk, it may be recommended that you take a prescription medicine daily to prevent HIV infection. This is called pre-exposure prophylaxis (PrEP). You are considered at risk if:  You are sexually active and do not regularly use condoms or know the HIV status of your partner(s).  You take drugs by injection.  You are sexually active with a partner who has HIV. Talk with your health care  provider about whether you are at high risk of being infected with HIV. If you choose to begin PrEP, you should first  be tested for HIV. You should then be tested every 3 months for as long as you are taking PrEP. Pregnancy  If you are premenopausal and you may become pregnant, ask your health care provider about preconception counseling.  If you may become pregnant, take 400 to 800 micrograms (mcg) of folic acid every day.  If you want to prevent pregnancy, talk to your health care provider about birth control (contraception). Osteoporosis and menopause  Osteoporosis is a disease in which the bones lose minerals and strength with aging. This can result in serious bone fractures. Your risk for osteoporosis can be identified using a bone density scan.  If you are 56 years of age or older, or if you are at risk for osteoporosis and fractures, ask your health care provider if you should be screened.  Ask your health care provider whether you should take a calcium or vitamin D supplement to lower your risk for osteoporosis.  Menopause may have certain physical symptoms and risks.  Hormone replacement therapy may reduce some of these symptoms and risks. Talk to your health care provider about whether hormone replacement therapy is right for you. Follow these instructions at home:  Schedule regular health, dental, and eye exams.  Stay current with your immunizations.  Do not use any tobacco products including cigarettes, chewing tobacco, or electronic cigarettes.  If you are pregnant, do not drink alcohol.  If you are breastfeeding, limit how much and how often you drink alcohol.  Limit alcohol intake to no more than 1 drink per day for nonpregnant women. One drink equals 12 ounces of beer, 5 ounces of wine, or 1 ounces of hard liquor.  Do not use street drugs.  Do not share needles.  Ask your health care provider for help if you need support or information about quitting  drugs.  Tell your health care provider if you often feel depressed.  Tell your health care provider if you have ever been abused or do not feel safe at home. This information is not intended to replace advice given to you by your health care provider. Make sure you discuss any questions you have with your health care provider. Document Released: 07/10/2010 Document Revised: 06/02/2015 Document Reviewed: 09/28/2014  2017 Elsevier

## 2015-12-16 ENCOUNTER — Encounter: Payer: Self-pay | Admitting: Internal Medicine

## 2015-12-16 ENCOUNTER — Other Ambulatory Visit (INDEPENDENT_AMBULATORY_CARE_PROVIDER_SITE_OTHER): Payer: BLUE CROSS/BLUE SHIELD

## 2015-12-16 DIAGNOSIS — Z0000 Encounter for general adult medical examination without abnormal findings: Secondary | ICD-10-CM

## 2015-12-16 LAB — COMPREHENSIVE METABOLIC PANEL
ALT: 23 U/L (ref 0–35)
AST: 29 U/L (ref 0–37)
Albumin: 4.8 g/dL (ref 3.5–5.2)
Alkaline Phosphatase: 86 U/L (ref 39–117)
BUN: 7 mg/dL (ref 6–23)
CO2: 30 mEq/L (ref 19–32)
Calcium: 9.7 mg/dL (ref 8.4–10.5)
Chloride: 103 mEq/L (ref 96–112)
Creatinine, Ser: 0.7 mg/dL (ref 0.40–1.20)
GFR: 89.34 mL/min (ref >60.00)
GLUCOSE: 122 mg/dL — AB (ref 70–99)
POTASSIUM: 4.2 meq/L (ref 3.5–5.1)
SODIUM: 140 meq/L (ref 135–145)
TOTAL PROTEIN: 7.9 g/dL (ref 6.0–8.3)
Total Bilirubin: 1 mg/dL (ref 0.2–1.2)

## 2015-12-16 LAB — LIPID PANEL
CHOL/HDL RATIO: 2
Cholesterol: 174 mg/dL (ref 0–200)
HDL: 73.4 mg/dL (ref >39.00)
LDL Cholesterol: 80 mg/dL (ref 0–99)
NonHDL: 100.62
Triglycerides: 105 mg/dL (ref 0.0–149.0)
VLDL: 21 mg/dL (ref 0.0–40.0)

## 2015-12-16 LAB — VITAMIN D 25 HYDROXY (VIT D DEFICIENCY, FRACTURES): VITD: 22.7 ng/mL — ABNORMAL LOW (ref 30.00–100.00)

## 2015-12-16 LAB — CBC
HEMATOCRIT: 51.2 % — AB (ref 36.0–46.0)
HEMOGLOBIN: 17.6 g/dL — AB (ref 12.0–15.0)
MCHC: 34.3 g/dL (ref 30.0–36.0)
MCV: 90.3 fl (ref 78.0–100.0)
PLATELETS: 271 10*3/uL (ref 150.0–400.0)
RBC: 5.67 Mil/uL — ABNORMAL HIGH (ref 3.87–5.11)
RDW: 13.3 % (ref 11.5–15.5)
WBC: 7.5 10*3/uL (ref 4.0–10.5)

## 2015-12-16 LAB — HEMOGLOBIN A1C: HEMOGLOBIN A1C: 5.8 % (ref 4.6–6.5)

## 2015-12-16 LAB — VITAMIN B12: Vitamin B-12: 517 pg/mL (ref 211–911)

## 2015-12-16 LAB — FERRITIN: Ferritin: 60.1 ng/mL (ref 10.0–291.0)

## 2015-12-16 NOTE — Assessment & Plan Note (Signed)
No flares since last year. Using advair and albuterol prn.

## 2015-12-16 NOTE — Assessment & Plan Note (Signed)
Chronic numbness on her right side (dominant) since stroke 1.5 years ago. BP is at goal and checking HgA1c and lipid panel today.

## 2015-12-16 NOTE — Assessment & Plan Note (Signed)
BP mildly above goal and she is taking 1/2 pill amlodipine. Will increase to 1 pill daily amlodipine for better control given hx stroke. Complicated by past stroke and cerebral vascular disease.

## 2015-12-16 NOTE — Assessment & Plan Note (Signed)
She has cut back from 2 bottles wine per day before stroke to trying not to go over 1 bottle wine per day which is still over the safe limits. Reviewed MRI with them in the visit showing them the images of the chronic disease which is likely related to her alcohol usage and we talked about how anything more than 1 glass per day is going to affect her long term memory outcome and likely will lead to early onset dementia from the alcohol usage.

## 2015-12-16 NOTE — Assessment & Plan Note (Signed)
Checking labs, given shingles and tetanus shot today. Declines flu shot today. Declines colonoscopy at this time. Ordered mammogram which she agrees to get done. Counseled on her alcohol usage which is likely contributing to her mental decline. Given screening recommendations and guidelines on safe alcohol usage and how some people need to stop alcohol altogether in order to meet those goals.

## 2016-01-17 ENCOUNTER — Other Ambulatory Visit: Payer: Self-pay | Admitting: Internal Medicine

## 2016-01-17 DIAGNOSIS — I69359 Hemiplegia and hemiparesis following cerebral infarction affecting unspecified side: Secondary | ICD-10-CM

## 2016-01-17 DIAGNOSIS — R21 Rash and other nonspecific skin eruption: Secondary | ICD-10-CM

## 2016-01-19 ENCOUNTER — Other Ambulatory Visit: Payer: Self-pay | Admitting: Internal Medicine

## 2016-01-19 NOTE — Telephone Encounter (Signed)
Pt called to follow up on this request. She is hoping for a years supply since she only sees you once a year.

## 2016-01-24 ENCOUNTER — Encounter: Payer: Self-pay | Admitting: Internal Medicine

## 2016-02-08 ENCOUNTER — Other Ambulatory Visit: Payer: Self-pay | Admitting: Internal Medicine

## 2016-02-15 ENCOUNTER — Ambulatory Visit: Payer: BLUE CROSS/BLUE SHIELD

## 2016-02-16 ENCOUNTER — Ambulatory Visit
Admission: RE | Admit: 2016-02-16 | Discharge: 2016-02-16 | Disposition: A | Discharge status: Home / Self Care | Payer: BLUE CROSS/BLUE SHIELD | Source: Ambulatory Visit | Attending: Internal Medicine | Admitting: Internal Medicine

## 2016-02-16 DIAGNOSIS — Z0000 Encounter for general adult medical examination without abnormal findings: Secondary | ICD-10-CM

## 2016-02-20 ENCOUNTER — Other Ambulatory Visit: Payer: Self-pay | Admitting: Internal Medicine

## 2016-02-20 DIAGNOSIS — R928 Other abnormal and inconclusive findings on diagnostic imaging of breast: Secondary | ICD-10-CM

## 2016-02-28 ENCOUNTER — Ambulatory Visit
Admission: RE | Admit: 2016-02-28 | Discharge: 2016-02-28 | Disposition: A | Discharge status: Home / Self Care | Payer: BLUE CROSS/BLUE SHIELD | Source: Ambulatory Visit | Attending: Internal Medicine | Admitting: Internal Medicine

## 2016-02-28 ENCOUNTER — Other Ambulatory Visit: Payer: Self-pay | Admitting: Internal Medicine

## 2016-02-28 DIAGNOSIS — R928 Other abnormal and inconclusive findings on diagnostic imaging of breast: Secondary | ICD-10-CM

## 2016-03-23 ENCOUNTER — Other Ambulatory Visit: Payer: Self-pay | Admitting: Internal Medicine

## 2016-05-11 ENCOUNTER — Other Ambulatory Visit: Payer: Self-pay | Admitting: Internal Medicine

## 2016-07-18 ENCOUNTER — Other Ambulatory Visit: Payer: Self-pay | Admitting: Internal Medicine

## 2016-08-04 ENCOUNTER — Other Ambulatory Visit: Payer: Self-pay | Admitting: Internal Medicine

## 2016-09-04 ENCOUNTER — Other Ambulatory Visit: Payer: Self-pay | Admitting: Internal Medicine

## 2016-09-06 ENCOUNTER — Other Ambulatory Visit: Payer: Self-pay | Admitting: Internal Medicine

## 2016-10-10 ENCOUNTER — Telehealth: Payer: Self-pay | Admitting: Internal Medicine

## 2016-10-10 NOTE — Telephone Encounter (Signed)
amLODipine (NORVASC) 5 MG tablet  Patient is calling about this medication. Stating she needs refills on this. But she should not need refills. She was taking a whole pill not breaking the pill in half. While speak with the patient we got disconnected. I called back x2 and LVM to call back.

## 2016-10-10 NOTE — Telephone Encounter (Signed)
Patient called back.  She has been taking her BP medication wrong. She was taking a whole pill not a half. She is going to start taking the half pill and watch her BP for 2 weeks. She has set up an appointment. If her BP starts to run high. She will come in sooner to talk about this medication and how she should be taking it.

## 2016-11-06 ENCOUNTER — Telehealth: Payer: Self-pay | Admitting: Neurology

## 2016-11-06 NOTE — Telephone Encounter (Signed)
It is ok to switch to a different provider.

## 2016-11-06 NOTE — Telephone Encounter (Signed)
Pt calling requesting to be seen , she has been told that from her MRI she has Cerebral Vascular Deficiency.  Pt states that she does not want to continue her care with Dr Krista Blue and would like to be changed to another provider.  Pt stated she didn't feel a connection or that she was completely understood by Dr Krista Blue.  Pt would like to be called with an appointment

## 2016-11-12 ENCOUNTER — Encounter: Payer: Self-pay | Admitting: Internal Medicine

## 2016-11-12 ENCOUNTER — Ambulatory Visit (INDEPENDENT_AMBULATORY_CARE_PROVIDER_SITE_OTHER): Payer: Medicare Other | Admitting: Internal Medicine

## 2016-11-12 VITALS — BP 134/82 | HR 87 | Temp 98.1°F | Ht 62.0 in | Wt 176.0 lb

## 2016-11-12 DIAGNOSIS — N393 Stress incontinence (female) (male): Secondary | ICD-10-CM

## 2016-11-12 DIAGNOSIS — J4520 Mild intermittent asthma, uncomplicated: Secondary | ICD-10-CM | POA: Diagnosis not present

## 2016-11-12 DIAGNOSIS — I10 Essential (primary) hypertension: Secondary | ICD-10-CM | POA: Diagnosis not present

## 2016-11-12 MED ORDER — MIRABEGRON ER 50 MG PO TB24: 50.0000 mg | ORAL_TABLET | Freq: Every day | ORAL | 1 refills | Status: DC

## 2016-11-12 MED ORDER — AMLODIPINE BESYLATE 5 MG PO TABS: 5.0000 mg | ORAL_TABLET | Freq: Every day | ORAL | 1 refills | Status: DC

## 2016-11-12 NOTE — Patient Instructions (Signed)
Come back for the welcome to medicare physical. This has to be done before your birthday in March.   We have sent in the amlodipine to take 1 pill daily.   We have sent in the myrbetriq to take 1 pill daily for the bladder.

## 2016-11-12 NOTE — Progress Notes (Signed)
   Subjective:    Patient ID: Toni Hicks, female    DOB: July 21, 1950, 66 y.o.   MRN: [id number]  HPI The patient is a 66 YO female coming in for several concerns including follow up of her blood pressure (she has been taking amlodipine 2.5 mg daily for several years, but has been taking 1 pill daily by accident and she feels this is helping more with BP, she would like to increase to 1 pill daily), and her bladder incontinence (would like to try myrbetriq, wakes up every 1-2 hours at night time, has to run during the day to the bathroom with urgency, no prolapse, has not tried anything before but did not like the memory side effects from the other medications), and her asthma (she would like to get 3 month supply of her medications, using albuterol 1-2 times daily, advair was only using 1 time per day due to cost but she just starting using BID 1-2 days ago, has not noticed a difference yet, no flare in the last year).   Review of Systems  Constitutional: Negative.   HENT: Negative.   Eyes: Negative.   Respiratory: Positive for shortness of breath. Negative for cough and chest tightness.   Cardiovascular: Negative for chest pain, palpitations and leg swelling.  Gastrointestinal: Negative for abdominal distention, abdominal pain, constipation, diarrhea, nausea and vomiting.  Genitourinary: Positive for enuresis, frequency and urgency.  Musculoskeletal: Negative.   Skin: Negative.   Neurological: Negative.   Psychiatric/Behavioral: Negative.       Objective:   Physical Exam  Constitutional: She is oriented to person, place, and time. She appears well-developed and well-nourished.  HENT:  Head: Normocephalic and atraumatic.  Eyes: EOM are normal.  Neck: Normal range of motion.  Cardiovascular: Normal rate and regular rhythm.  Pulmonary/Chest: Effort normal and breath sounds normal. No respiratory distress. She has no wheezes. She has no rales.  Abdominal: Soft. Bowel sounds are normal.  She exhibits no distension. There is no tenderness. There is no rebound.  Musculoskeletal: She exhibits no edema.  Neurological: She is alert and oriented to person, place, and time. Coordination normal.  Skin: Skin is warm and dry.  Psychiatric: She has a normal mood and affect.   Vitals:   11/12/16 1446 11/12/16 1515  BP: 140/80 134/82  Pulse: 87   Temp: 98.1 F (36.7 C)   TempSrc: Oral   SpO2: 99%   Weight: 176 lb (79.8 kg)   Height: 5\' 2"  (1.575 m)       Assessment & Plan:

## 2016-11-13 DIAGNOSIS — N393 Stress incontinence (female) (male): Secondary | ICD-10-CM | POA: Insufficient documentation

## 2016-11-13 NOTE — Assessment & Plan Note (Signed)
Rx for myrbetriq today for the night time awakenings and urgency during the day. Come back in a couple months to discuss efficacy.

## 2016-11-13 NOTE — Assessment & Plan Note (Signed)
Will increase amlodipine to 5 mg daily for simplicity as she is not able to split and she does not remember to use that.

## 2016-11-13 NOTE — Assessment & Plan Note (Signed)
She has not been using advair correctly and just once per day. She is counseled on the efficacy of the medication and the need to take twice per day. She declines to change to a once daily medication for better control, refill her albuterol and advair today.

## 2016-11-14 ENCOUNTER — Other Ambulatory Visit: Payer: Self-pay | Admitting: Internal Medicine

## 2016-11-26 ENCOUNTER — Encounter: Payer: Self-pay | Admitting: Family Medicine

## 2016-11-26 ENCOUNTER — Telehealth: Payer: Self-pay | Admitting: Internal Medicine

## 2016-11-26 ENCOUNTER — Ambulatory Visit (INDEPENDENT_AMBULATORY_CARE_PROVIDER_SITE_OTHER): Payer: Medicare Other | Admitting: Family Medicine

## 2016-11-26 VITALS — BP 160/80 | Temp 98.4°F | Ht 62.0 in | Wt 175.0 lb

## 2016-11-26 DIAGNOSIS — J45901 Unspecified asthma with (acute) exacerbation: Secondary | ICD-10-CM

## 2016-11-26 DIAGNOSIS — I10 Essential (primary) hypertension: Secondary | ICD-10-CM

## 2016-11-26 MED ORDER — PREDNISONE 50 MG PO TABS: ORAL_TABLET | ORAL | 0 refills | Status: AC

## 2016-11-26 NOTE — Progress Notes (Signed)
    Subjective:  Toni Hicks is a 66 y.o. female who presents today with a chief complaint of cough.   HPI:  Cough, acute issue Started 3 days ago. Getting worse over the past day. She tried taking silver colloid at home which seemed to help some. Her oxygen levels at home have been in the mid 90s - per her they are usually above 98. She has been using her albuterol several times per day, which helps. Mildly increased shortness of breath that is worse with exertion. Also with a productive cough. Some wheeze.  No fever.  Hypertension, chronic issue Recently started on amlodipine 5 mg daily.  Reports compliance to his medication.  Systolic is typically is in the 130s per patient.  ROS: Per HPI  PMH: Smoking history reviewed. Former smoker.   Objective:  Physical Exam: BP (!) 160/80   Temp 98.4 F (36.9 C) (Oral)   Ht 5\' 2"  (1.575 m)   Wt 175 lb (79.4 kg)   SpO2 95%   BMI 32.01 kg/m   Gen: NAD, resting comfortably, able to speak in full sentences HEENT: TMs clear.  Moist mucous membranes.  Oropharynx clear without exudate. CV: RRR with no murmurs appreciated Pulm: NWOB, mild bibasilar wheezes. MSK: No edema, cyanosis, or clubbing noted Skin: Warm, dry Neuro: Grossly normal, moves all extremities Psych: Normal affect and thought content  Assessment/Plan:  Asthma exacerbation Mild to moderate exacerbation.  Her respiratory status is stable.  Start prednisone for 5-day course.  Encouraged use of albuterol as needed every 3-4 hours for the next couple of days.  Continue Advair.  Return precautions reviewed including chest pain, worsening shortness of breath, or failure to respond to prednisone and albuterol.  Hypertension Elevated today in setting of acute illness.  Continue amlodipine.  Advised patient to continue checking at home and follow-up with her PCP soon if persistently elevated.  Algis Greenhouse. Jerline Pain, MD 11/26/2016 3:35 PM

## 2016-11-26 NOTE — Telephone Encounter (Signed)
Called pt and informed of same.

## 2016-11-26 NOTE — Telephone Encounter (Signed)
Called patient and she stated that coming in will compromise her immune system. She is requesting steroids to be called in.

## 2016-11-26 NOTE — Telephone Encounter (Signed)
Cannot call anything in, would need visit

## 2016-11-26 NOTE — Telephone Encounter (Signed)
Patient Name: Toni Hicks  DOB: 03-11-1950    Initial Comment Caller states thinks she has pneumonia again, oxygen is around 43 now, and she feels very tired, difficulty breathing.    Nurse Assessment  Nurse: [name], RN, [name] Date/Time (Eastern Time): 11/26/2016 9:11:37 AM  Confirm and document reason for call. If symptomatic, describe symptoms. ---Caller states she is coughing which started yesterday. having some difficulty breathing. Oxygen level 89-91. Heart rate is elevated. Coughing up some thick sputum. Becomes more SOB with exertion.  Does the patient have any new or worsening symptoms? ---Yes  Will a triage be completed? ---Yes  Related visit to physician within the last 2 weeks? ---No  Does the PT have any chronic conditions? (i.e. diabetes, asthma, etc.) ---Yes  List chronic conditions. ---asthma  Is this a behavioral health or substance abuse call? ---No     Guidelines    Guideline Title Affirmed Question Affirmed Notes  Cough - Chronic Difficulty breathing (Exception: no change from usual, chronic shortness of breath)    Final Disposition User   Go to ED Now [name], RN, [name]    Comments  triage outcome upgraded to go to ER now as pt's oxygen level is 89-91 and sounds very SOB on the phone. She does not want go to the ER or come to the office as she does not want to be around sick people. She would like some steroids called in.  Called back line and spoke to Harmon and gave report that pt has asthma. Coughing up thick sputum. SOB on the phone. Oxygen level of 89-91% and 2 weeks ago at the office it was 99%. Triage outcome of go to ER but does not want to go to the ER or even come to the office but wants steroids called. in.   Referrals  GO TO FACILITY REFUSED   Caller Disagree/Comply Disagree  Caller Understands Yes  PreDisposition Call Doctor

## 2016-11-27 ENCOUNTER — Ambulatory Visit: Payer: Medicare Other | Admitting: Internal Medicine

## 2016-12-07 ENCOUNTER — Encounter (HOSPITAL_COMMUNITY): Payer: Self-pay | Admitting: Emergency Medicine

## 2016-12-07 ENCOUNTER — Ambulatory Visit (HOSPITAL_COMMUNITY)
Admission: EM | Admit: 2016-12-07 | Discharge: 2016-12-07 | Disposition: A | Discharge status: Home / Self Care | Payer: Medicare Other | Attending: Emergency Medicine | Admitting: Emergency Medicine

## 2016-12-07 ENCOUNTER — Telehealth: Payer: Self-pay | Admitting: Family Medicine

## 2016-12-07 ENCOUNTER — Ambulatory Visit: Payer: Self-pay | Admitting: *Deleted

## 2016-12-07 ENCOUNTER — Ambulatory Visit (INDEPENDENT_AMBULATORY_CARE_PROVIDER_SITE_OTHER): Payer: Medicare Other

## 2016-12-07 DIAGNOSIS — J45901 Unspecified asthma with (acute) exacerbation: Secondary | ICD-10-CM

## 2016-12-07 DIAGNOSIS — J4541 Moderate persistent asthma with (acute) exacerbation: Secondary | ICD-10-CM | POA: Diagnosis not present

## 2016-12-07 DIAGNOSIS — R05 Cough: Secondary | ICD-10-CM | POA: Diagnosis not present

## 2016-12-07 DIAGNOSIS — R0602 Shortness of breath: Secondary | ICD-10-CM

## 2016-12-07 MED ORDER — AZITHROMYCIN 500 MG PO TABS: 500.0000 mg | ORAL_TABLET | Freq: Every day | ORAL | 0 refills | Status: AC

## 2016-12-07 MED ORDER — IPRATROPIUM-ALBUTEROL 0.5-2.5 (3) MG/3ML IN SOLN
RESPIRATORY_TRACT | Status: AC
  Filled 2016-12-07: qty 3

## 2016-12-07 MED ORDER — PREDNISONE 20 MG PO TABS
60.0000 mg | ORAL_TABLET | Freq: Once | ORAL | Status: AC
  Administered 2016-12-07: 60 mg via ORAL

## 2016-12-07 MED ORDER — PREDNISONE 20 MG PO TABS
ORAL_TABLET | ORAL | Status: AC
  Filled 2016-12-07: qty 3

## 2016-12-07 MED ORDER — AEROCHAMBER PLUS MISC: 2 refills | Status: DC

## 2016-12-07 MED ORDER — PREDNISONE 10 MG (21) PO TBPK: ORAL_TABLET | ORAL | 0 refills | Status: DC

## 2016-12-07 MED ORDER — IPRATROPIUM-ALBUTEROL 0.5-2.5 (3) MG/3ML IN SOLN
3.0000 mL | Freq: Once | RESPIRATORY_TRACT | Status: AC
  Administered 2016-12-07: 3 mL via RESPIRATORY_TRACT

## 2016-12-07 NOTE — Telephone Encounter (Signed)
Copied from [place] [id number]. Topic: Quick Communication - See Telephone Encounter >> Dec 07, 2016  3:41 PM Bea Graff, NT wrote: CRM for notification. See Telephone encounter for: Patient states she is feeling some better from her pneumonia but feels like she has something else going on (cold like symptoms) and had a fever yesterday. She wants to see if an antibiotic can be called in to [facility] on [address].  12/07/16.

## 2016-12-07 NOTE — Telephone Encounter (Signed)
Pt  Was  Seen last  Week  For  resp  Symptoms  . She  Reports   Took  Medication   And  Has  Been  Getting  Progressively  Worse .She  Reports  intermittant  Wheezing   As   Well  As   Greenish brown  Sputum production and    Which  She  Describes  As  A low  Grade  Fever.  She was  Advised  To proceed  To  An urgent  Care  For   Treatment .  She  Was  Advised  Directions  And  [employer] .   Reason for Disposition . Wheezing is present  Answer Assessment - Initial Assessment Questions 1. ONSET: "When did the cough begin?"       12  Days    2. SEVERITY: "How bad is the cough today?"        Productive  Cough  6 x  A  Day    intermittant 3. RESPIRATORY DISTRESS: "Describe your breathing."       Shallow and  Labored  4. FEVER: "Do you have a fever?" If so, ask: "What is your temperature, how was it measured, and when did it start?"     Low  Grade  intermittant   Fever   X  3  Days   5. SPUTUM: "Describe the color of your sputum" (clear, white, yellow, green)      Brownish  Gray   Thick   6. HEMOPTYSIS: "Are you coughing up any blood?" If so ask: "How much?" (flecks, streaks, tablespoons, etc.)     Sputum   7. CARDIAC HISTORY: "Do you have any history of heart disease?" (e.g., heart attack, congestive heart failure)        No 8. LUNG HISTORY: "Do you have any history of lung disease?"  (e.g., pulmonary embolus, asthma, emphysema)     Pnuemonia   Asthma   alleries   9. PE RISK FACTORS: "Do you have a history of blood clots?" (or: recent major surgery, recent prolonged travel, bedridden )       No 10. OTHER SYMPTOMS: "Do you have any other symptoms?" (e.g., runny nose, wheezing, chest pain)        WHEEZING     AT  TIMES   ON  EXERTION     MILD  CHEST  PAIN  WHEN  COUGHS   11. PREGNANCY: "Is there any chance you are pregnant?" "When was your last menstrual period?"       MENAPAUSE 12. TRAVEL: "Have you traveled out of the country in the last month?" (e.g., travel history,  exposures)       nO  Protocols used: [place]

## 2016-12-07 NOTE — ED Provider Notes (Signed)
HPI  SUBJECTIVE:  Toni Hicks is a 66 y.o. female who presents with coughing productive of grayish brown mucus, wheezing, shortness of breath, dyspnea on exertion, chest soreness secondary to the cough for 2 weeks.  She reports nasal congestion, postnasal drip, malaise.  She reports feeling feverish, but has no documented temperatures above 100.4.  She has been taking silver colloid, using her albuterol every 2-3 hours.  Does not have a spacer.  She finished 5 days of prednisone 1 week ago.  She is also compliant with her Advair.  These improve her symptoms.  Symptoms are worse with exertion, talking. Patient was seen by her PMD on 11/19, was satting 95% on room air at that time.  Thought to have an asthma exacerbation, given 5 days of steroids, albuterol every 3-4 hours, continue Advair- states that she got better with this, but then got acutely worse several days ago.  She has a past medical history of hypertension, asthma, recurrent pneumonia, stroke. She is a former smoker.  No history of emphysema, COPD, CHF, PE, DVT, cancer, diabetes.  DUK:GURKYHCW, Real Cons, MD   Past Medical History:  Diagnosis Date  . ADD (attention deficit disorder)   . Adult acne   . Alcohol abuse   . Allergic rhinitis   . Anxiety and depression   . Asthma   . Congenital pneumonia   . Hypoglycemia   . Hypothyroidism   . Impaired fasting blood sugar   . Mycoplasma pneumonia   . Restless leg syndrome   . Stroke [facility])     Past Surgical History:  Procedure Laterality Date  . DILATION AND CURETTAGE OF UTERUS     @ 66 years old  . WISDOM TOOTH EXTRACTION      Family History  Problem Relation Age of Onset  . COPD Father   . Heart failure Father   . Coronary artery disease Father   . Cancer Mother        breast  . Hypertension Mother   . Dementia Mother   . Stroke Mother   . Breast cancer Mother     Social History   Tobacco Use  . Smoking status: Former Research scientist (life sciences)  . Smokeless tobacco: Never Used   Substance Use Topics  . Alcohol use: Yes    Alcohol/week: 0.0 oz    Comment: 4-5 glasses of wine/day.  . Drug use: No    No current facility-administered medications for this encounter.   Current Outpatient Medications:  .  ADVAIR DISKUS 250-50 MCG/DOSE AEPB, INHALE 1 PUFF INTO THE LUNGS EVERY 12 (TWELVE) HOURS., Disp: 60 each, Rfl: 11 .  albuterol (PROVENTIL HFA;VENTOLIN HFA) 108 (90 Base) MCG/ACT inhaler, INHALE 2 PUFFS EVERY 6 HOURS AS NEEDED FOR WHEEZING OR SHORTNESS OF BREATH, Disp: 18 Inhaler, Rfl: 3 .  amLODipine (NORVASC) 5 MG tablet, Take 1 tablet (5 mg total) daily by mouth., Disp: 90 tablet, Rfl: 1 .  aspirin EC 81 MG tablet, Take 81 mg by mouth daily., Disp: , Rfl:  .  b complex vitamins tablet, Take 1 tablet by mouth as needed.  , Disp: , Rfl:  .  fluocinonide cream (LIDEX) 0.05 %, APPLY TOPICALLY 3 (THREE) TIMES DAILY AS NEEDED (SKIN IRRITATION)., Disp: 120 g, Rfl: 0 .  mirabegron ER (MYRBETRIQ) 50 MG TB24 tablet, Take 1 tablet (50 mg total) daily by mouth., Disp: 90 tablet, Rfl: 1 .  nystatin-triamcinolone ointment (MYCOLOG), APPLY 1 APPLICATION TOPICALLY 2 (TWO) TIMES DAILY., Disp: 60 g, Rfl: 3 .  OVER THE COUNTER MEDICATION, Take 1 Container by mouth every other day. Ke[place], Disp: , Rfl:  .  azithromycin (ZITHROMAX) 500 MG tablet, Take 1 tablet (500 mg total) by mouth daily for 5 days., Disp: 5 tablet, Rfl: 0 .  predniSONE (STERAPRED UNI-PAK 21 TAB) 10 MG (21) TBPK tablet, Dispense one 6 day pack. Take as directed with food., Disp: 21 tablet, Rfl: 0 .  Spacer/Aero-Holding Chambers (AEROCHAMBER PLUS) inhaler, Use as instructed, Disp: 1 each, Rfl: 2  Allergies  Allergen Reactions  . Other     Can not eat carbohydrates without protein, is allergic to certain foods but can take in certain doses     ROS  As noted in HPI.   Physical Exam  BP 138/79 (BP Location: Right Arm)   Pulse 89   Temp 99.4 F (37.4 C) (Oral)   Resp 20   SpO2 98%   Constitutional: Well  developed, well nourished, no acute distress Eyes:  EOMI, conjunctiva normal bilaterally HENT: Normocephalic, atraumatic,mucus membranes moist positive nasal congestion.  No sinus tenderness. Respiratory: Normal inspiratory effort.  Poor air movement, lungs clear bilaterally.  Positive lateral chest wall tenderness Cardiovascular: Normal rate regular rhythm no murmurs rubs or gallops GI: nondistended skin: No rash, skin intact Musculoskeletal: Calves symmetric, nontender, no edema Neurologic: Alert & oriented x 3, no focal neuro deficits Psychiatric: Speech and behavior appropriate   ED Course   Medications  ipratropium-albuterol (DUONEB) 0.5-2.5 (3) MG/3ML nebulizer solution 3 mL (3 mLs Nebulization Given 12/07/16 1959)  predniSONE (DELTASONE) tablet 60 mg (60 mg Oral Given 12/07/16 1959)    Orders Placed This Encounter  Procedures  . DG Chest 2 View    Standing Status:   Standing    Number of Occurrences:   1    Order Specific Question:   Reason for Exam (SYMPTOM  OR DIAGNOSIS REQUIRED)    Answer:   r/o pna    No results found for this or any previous visit (from the past 24 hour(s)). Dg Chest 2 View  Result Date: 12/07/2016 CLINICAL DATA:  Productive cough with sputum. EXAM: CHEST  2 VIEW COMPARISON:  None. FINDINGS: The heart size and mediastinal contours are within normal limits. Both lungs are clear. The visualized skeletal structures are unremarkable. IMPRESSION: No active cardiopulmonary disease. Electronically Signed   By: Staci Righter M.D.   On: 12/07/2016 19:48    ED Clinical Impression  Moderate asthma with acute exacerbation, unspecified whether persistent   ED Assessment/Plan  outside records reviewed.  As noted in HPI.  Reviewed imaging independently.  No active cardiopulmonary disease per radiology.  See radiology report for full details.  Gave 60 mg of prednisone and DuoNeb.  On reevaluation, patient states that she feels significantly better.  Repeat  O2 sat 98% on room air, improved air movement, lungs still clear on auscultation.    PE in the ddx but think this is less likely in absence of tachycardia, hypoxia. Presentation consistent with URI/asthma exacerbation, but concern for atypical pneumonia given duration of symptoms and history of double sickening.  Home with azithromycin 500 mg for 5 days given patient's duration of symptoms, will prescribe a spacer for her to use with her albuterol inhaler every 4-6 hours.  She is to continue her Flonase but start it daily.  We will also send home with another 6 day steroid taper.  She will follow-up with her primary care physician in 5 days, and she is to go to the ER for any  worsening of symptoms or concerns.  Discussed imaging, MDM, plan and followup with patient. Discussed sn/sx that should prompt return to the ED. patient agrees with plan.   Meds ordered this encounter  Medications  . ipratropium-albuterol (DUONEB) 0.5-2.5 (3) MG/3ML nebulizer solution 3 mL  . predniSONE (DELTASONE) tablet 60 mg  . Spacer/Aero-Holding Chambers (AEROCHAMBER PLUS) inhaler    Sig: Use as instructed    Dispense:  1 each    Refill:  2  . predniSONE (STERAPRED UNI-PAK 21 TAB) 10 MG (21) TBPK tablet    Sig: Dispense one 6 day pack. Take as directed with food.    Dispense:  21 tablet    Refill:  0  . azithromycin (ZITHROMAX) 500 MG tablet    Sig: Take 1 tablet (500 mg total) by mouth daily for 5 days.    Dispense:  5 tablet    Refill:  0    *This clinic note was created using [employer]. Therefore, there may be occasional mistakes despite careful proofreading.   ?    Melynda Ripple, MD 12/07/16 2032

## 2016-12-07 NOTE — ED Triage Notes (Signed)
PT C/O: persistent pneumonia sx.... Reports PCP treated her for viral pneumonia w/steroids.... No x-rays were done  ONSET: 4 days  SX ALSO INCLUDE: prod cough, fevers, fatigue, BA, weakness  DENIES:   TAKING MEDS: OTC Vitamins   A&O x4... NAD... Ambulatory

## 2016-12-07 NOTE — Discharge Instructions (Signed)
Finish the azithromycin and steroids unless your doctor tells you to stop.  1-2 puffs from your albuterol inhaler every 4 hours using your spacer.  You may back off on this as you start to feel better.  Start some saline nasal irrigation with a Neti pot or Milta Deiters med sinus rinse.  Flonase once daily for the next several days, back off on this as your nasal congestion improves.  Go to the ER for the signs and symptoms were discussed.

## 2016-12-10 NOTE — Telephone Encounter (Signed)
Looks like azithromycin was already started.  Algis Greenhouse. Jerline Pain, MD 12/10/2016 1:38 PM

## 2016-12-13 DIAGNOSIS — H5213 Myopia, bilateral: Secondary | ICD-10-CM | POA: Diagnosis not present

## 2016-12-13 DIAGNOSIS — I639 Cerebral infarction, unspecified: Secondary | ICD-10-CM | POA: Diagnosis not present

## 2016-12-13 DIAGNOSIS — H524 Presbyopia: Secondary | ICD-10-CM | POA: Diagnosis not present

## 2016-12-13 DIAGNOSIS — H5340 Unspecified visual field defects: Secondary | ICD-10-CM | POA: Diagnosis not present

## 2016-12-31 ENCOUNTER — Other Ambulatory Visit: Payer: Self-pay | Admitting: Internal Medicine

## 2016-12-31 ENCOUNTER — Other Ambulatory Visit: Payer: Self-pay

## 2016-12-31 MED ORDER — ALBUTEROL SULFATE HFA 108 (90 BASE) MCG/ACT IN AERS: INHALATION_SPRAY | RESPIRATORY_TRACT | 3 refills | Status: DC

## 2017-01-14 NOTE — Telephone Encounter (Signed)
I'm more than happy to see her. What MRI is she referring to? I see one from 2016. If there is a more recent one, she needs to get that for the appointment thanks

## 2017-01-14 NOTE — Telephone Encounter (Signed)
Since Dr Krista Blue has given the okay for Toni Hicks to be seen by another provider 11-06-2016 I have scheduled her with Dr Jaynee Eagles, Toni Hicks is asking for an explanation of her MRI and to discuss Cerebral Vascular Deficiency.  Toni Hicks is not asking for a call back, she will keep appointment set for 02-27-2017 with check in of 2:30 for 3:00 appointment

## 2017-01-15 ENCOUNTER — Telehealth: Payer: Self-pay | Admitting: *Deleted

## 2017-01-15 NOTE — Telephone Encounter (Signed)
error 

## 2017-01-15 NOTE — Telephone Encounter (Addendum)
Called and spoke with patient. She was talking about the 2016 MRI- no recent MRIs. Confirmed appt for 02/27/17 @ 3:00 arrival time 2:30. Patient verbalized understanding and appreciation.   Dr. Jaynee Eagles aware.

## 2017-02-27 ENCOUNTER — Ambulatory Visit (INDEPENDENT_AMBULATORY_CARE_PROVIDER_SITE_OTHER): Payer: Medicare Other | Admitting: Neurology

## 2017-02-27 ENCOUNTER — Encounter: Payer: Self-pay | Admitting: Neurology

## 2017-02-27 VITALS — BP 136/80 | HR 83 | Ht 62.0 in | Wt 177.0 lb

## 2017-02-27 DIAGNOSIS — R0683 Snoring: Secondary | ICD-10-CM | POA: Diagnosis not present

## 2017-02-27 DIAGNOSIS — F1910 Other psychoactive substance abuse, uncomplicated: Secondary | ICD-10-CM | POA: Diagnosis not present

## 2017-02-27 DIAGNOSIS — F1210 Cannabis abuse, uncomplicated: Secondary | ICD-10-CM | POA: Diagnosis not present

## 2017-02-27 DIAGNOSIS — R410 Disorientation, unspecified: Secondary | ICD-10-CM

## 2017-02-27 DIAGNOSIS — I639 Cerebral infarction, unspecified: Secondary | ICD-10-CM

## 2017-02-27 DIAGNOSIS — I679 Cerebrovascular disease, unspecified: Secondary | ICD-10-CM | POA: Diagnosis not present

## 2017-02-27 DIAGNOSIS — F32A Depression, unspecified: Secondary | ICD-10-CM

## 2017-02-27 DIAGNOSIS — F419 Anxiety disorder, unspecified: Secondary | ICD-10-CM | POA: Diagnosis not present

## 2017-02-27 DIAGNOSIS — I6381 Other cerebral infarction due to occlusion or stenosis of small artery: Secondary | ICD-10-CM

## 2017-02-27 DIAGNOSIS — F329 Major depressive disorder, single episode, unspecified: Secondary | ICD-10-CM

## 2017-02-27 DIAGNOSIS — Z9114 Patient's other noncompliance with medication regimen: Secondary | ICD-10-CM | POA: Diagnosis not present

## 2017-02-27 DIAGNOSIS — R413 Other amnesia: Secondary | ICD-10-CM

## 2017-02-27 DIAGNOSIS — R5382 Chronic fatigue, unspecified: Secondary | ICD-10-CM | POA: Diagnosis not present

## 2017-02-27 DIAGNOSIS — Z91148 Patient's other noncompliance with medication regimen for other reason: Secondary | ICD-10-CM

## 2017-02-27 NOTE — Patient Instructions (Addendum)
MRI brain Lab test today EEG Follow in 6 months   Stroke Prevention Some medical conditions and behaviors are associated with a higher chance of having a stroke. You can help prevent a stroke by making nutrition, lifestyle, and other changes, including managing any medical conditions you may have. What nutrition changes can be made?  Eat healthy foods. You can do this by: ? Choosing foods high in fiber, such as fresh fruits and vegetables and whole grains. ? Eating at least 5 or more servings of fruits and vegetables a day. Try to fill half of your plate at each meal with fruits and vegetables. ? Choosing lean protein foods, such as lean cuts of meat, poultry without skin, fish, tofu, beans, and nuts. ? Eating low-fat dairy products. ? Avoiding foods that are high in salt (sodium). This can help lower blood pressure. ? Avoiding foods that have saturated fat, trans fat, and cholesterol. This can help prevent high cholesterol. ? Avoiding processed and premade foods.  Follow your health care provider's specific guidelines for losing weight, controlling high blood pressure (hypertension), lowering high cholesterol, and managing diabetes. These may include: ? Reducing your daily calorie intake. ? Limiting your daily sodium intake to 1,500 milligrams (mg). ? Using only healthy fats for cooking, such as olive oil, canola oil, or sunflower oil. ? Counting your daily carbohydrate intake. What lifestyle changes can be made?  Maintain a healthy weight. Talk to your health care provider about your ideal weight.  Get at least 30 minutes of moderate physical activity at least 5 days a week. Moderate activity includes brisk walking, biking, and swimming.  Do not use any products that contain nicotine or tobacco, such as cigarettes and e-cigarettes. If you need help quitting, ask your health care provider. It may also be helpful to avoid exposure to secondhand smoke.  Limit alcohol intake to no more  than 1 drink a day for nonpregnant women and 2 drinks a day for men. One drink equals 12 oz of beer, 5 oz of wine, or 1 oz of hard liquor.  Stop any illegal drug use.  Avoid taking birth control pills. Talk to your health care provider about the risks of taking birth control pills if: ? You are over 54 years old. ? You smoke. ? You get migraines. ? You have ever had a blood clot. What other changes can be made?  Manage your cholesterol levels. ? Eating a healthy diet is important for preventing high cholesterol. If cholesterol cannot be managed through diet alone, you may also need to take medicines. ? Take any prescribed medicines to control your cholesterol as told by your health care provider.  Manage your diabetes. ? Eating a healthy diet and exercising regularly are important parts of managing your blood sugar. If your blood sugar cannot be managed through diet and exercise, you may need to take medicines. ? Take any prescribed medicines to control your diabetes as told by your health care provider.  Control your hypertension. ? To reduce your risk of stroke, try to keep your blood pressure below 130/80. ? Eating a healthy diet and exercising regularly are an important part of controlling your blood pressure. If your blood pressure cannot be managed through diet and exercise, you may need to take medicines. ? Take any prescribed medicines to control hypertension as told by your health care provider. ? Ask your health care provider if you should monitor your blood pressure at home. ? Have your blood pressure checked every  year, even if your blood pressure is normal. Blood pressure increases with age and some medical conditions.  Get evaluated for sleep disorders (sleep apnea). Talk to your health care provider about getting a sleep evaluation if you snore a lot or have excessive sleepiness.  Take over-the-counter and prescription medicines only as told by your health care provider.  Aspirin or blood thinners (antiplatelets or anticoagulants) may be recommended to reduce your risk of forming blood clots that can lead to stroke.  Make sure that any other medical conditions you have, such as atrial fibrillation or atherosclerosis, are managed. What are the warning signs of a stroke? The warning signs of a stroke can be easily remembered as BEFAST.  B is for balance. Signs include: ? Dizziness. ? Loss of balance or coordination. ? Sudden trouble walking.  E is for eyes. Signs include: ? A sudden change in vision. ? Trouble seeing.  F is for face. Signs include: ? Sudden weakness or numbness of the face. ? The face or eyelid drooping to one side.  A is for arms. Signs include: ? Sudden weakness or numbness of the arm, usually on one side of the body.  S is for speech. Signs include: ? Trouble speaking (aphasia). ? Trouble understanding.  T is for time. ? These symptoms may represent a serious problem that is an emergency. Do not wait to see if the symptoms will go away. Get medical help right away. Call your local emergency services (911 in the [place] Do not drive yourself to the hospital.  Other signs of stroke may include: ? A sudden, severe headache with no known cause. ? Nausea or vomiting. ? Seizure.  Where to find more information: For more information, visit:  American Stroke Association: www.strokeassociation.org  National Stroke Association: www.stroke.org  Summary  You can prevent a stroke by eating healthy, exercising, not smoking, limiting alcohol intake, and managing any medical conditions you may have.  Do not use any products that contain nicotine or tobacco, such as cigarettes and e-cigarettes. If you need help quitting, ask your health care provider. It may also be helpful to avoid exposure to secondhand smoke.  Remember BEFAST for warning signs of stroke. Get help right away if you or a loved one has any of these signs. This  information is not intended to replace advice given to you by your health care provider. Make sure you discuss any questions you have with your health care provider. Document Released: 02/02/2004 Document Revised: 01/31/2016 Document Reviewed: 01/31/2016 Elsevier Interactive Patient Education  [employer].

## 2017-02-27 NOTE — Progress Notes (Signed)
[facility]    Provider:  Dr Jaynee Eagles Referring Provider: Hoyt Koch, * Primary Care Physician:  Hoyt Koch, MD  CC:  Review MRI brain  HPI:  Toni Hicks is a 67 y.o. female here as a referral from Dr. Sharlet Salina for review of MRI of the brain. Patient here today with daughter who provides much information. She is noncompliant with medication and has stopped taking her aspirin and not taking recommended meds such as statin for HDL. Smoked for 20 years, diabetes under control now but was not at one time, smokes marijuana, long history of alcohol use now maybe a bottle a day.She has moments of complete confusion. She has memory loss.Short term memory is worsening. She has untreated depression and anxiety. She snores and wakes herself up snoring, she struggles to go back to sleep, she snorts, breathing irregularities when sleeping. She has irregular breathing, "snorting, stopping, escalating noise" Spent extended time with patient and daughter discussing her moderately advanced microvascular ischemic changes in the brain which puts her at risk for increased strokes, her previous stroke due to small-vessel disease, her non-complaince with medication is increasing her risk for another stroke and multiple other health problems.   Reviewed notes, labs and imaging from outside physicians, which showed:  MRI brain 02/2014: personally reviewed images and agree with the following. Also reviewed images with patient and daughter. IMPRESSION: 1. Acute left thalamic infarct. 2. Moderate chronic small vessel ischemic disease.  12/2015: hgba1c 5.8, ldl 80, B12 517  Review of Systems: Patient complains of symptoms per HPI as well as the following symptoms: stroke. Pertinent negatives and positives per HPI. All others negative.   Social History   Socioeconomic History  . Marital status: Widowed    Spouse name: Not on file  . Number of children: 2  . Years of  education: 42  . Highest education level: Some college, no degree  Social Needs  . Financial resource strain: Not on file  . Food insecurity - worry: Not on file  . Food insecurity - inability: Not on file  . Transportation needs - medical: Not on file  . Transportation needs - non-medical: Not on file  Occupational History  . Occupation: Clinical biochemist    Comment: Semi-retired  Tobacco Use  . Smoking status: Former Research scientist (life sciences)  . Smokeless tobacco: Never Used  Substance and Sexual Activity  . Alcohol use: Yes    Alcohol/week: 0.0 oz    Comment: 3-5 glasses of wine day   . Drug use: No  . Sexual activity: Not on file  Other Topics Concern  . Not on file  Social History Narrative   [employer], [employer]- photography   Married '73- 10 years divorced, married '86- 65 years divorced, Married '96- 74 years- widowed.   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   No caffeine    Family History  Problem Relation Age of Onset  . COPD Father   . Heart failure Father   . Coronary artery disease Father   . Cancer Mother        breast  . Hypertension Mother   . Dementia Mother   . Stroke Mother   . Breast cancer Mother     Past Medical History:  Diagnosis Date  . ADD (attention deficit disorder)   . Adult acne   . Alcohol abuse   . Allergic  rhinitis   . Anxiety and depression   . Asthma   . Congenital pneumonia   . Hypoglycemia    no longer per pt   . Hypothyroidism   . Impaired fasting blood sugar   . Mycoplasma pneumonia   . Restless leg syndrome   . Seasonal allergies   . Stroke [facility])     Past Surgical History:  Procedure Laterality Date  . DILATION AND CURETTAGE OF UTERUS     @ 67 years old  . WISDOM TOOTH EXTRACTION      Current Outpatient Medications  Medication Sig Dispense Refill  . ADVAIR DISKUS 250-50 MCG/DOSE AEPB INHALE 1 PUFF INTO THE LUNGS EVERY  12 (TWELVE) HOURS. 60 each 11  . albuterol (PROVENTIL HFA;VENTOLIN HFA) 108 (90 Base) MCG/ACT inhaler INHALE 2 PUFFS EVERY 6 HOURS AS NEEDED FOR WHEEZING OR SHORTNESS OF BREATH 18 Inhaler 3  . amLODipine (NORVASC) 5 MG tablet Take 1 tablet (5 mg total) daily by mouth. 90 tablet 1  . b complex vitamins tablet Take 1 tablet by mouth as needed.      . fluocinonide cream (LIDEX) 0.05 % APPLY TOPICALLY 3 (THREE) TIMES DAILY AS NEEDED (SKIN IRRITATION). 120 g 0  . aspirin EC 81 MG tablet Take 81 mg by mouth daily.    . mirabegron ER (MYRBETRIQ) 50 MG TB24 tablet Take 1 tablet (50 mg total) daily by mouth. (Patient not taking: Reported on 02/27/2017) 90 tablet 1  . Spacer/Aero-Holding Chambers (AEROCHAMBER PLUS) inhaler Use as instructed (Patient not taking: Reported on 02/27/2017) 1 each 2   No current facility-administered medications for this visit.     Allergies as of 02/27/2017 - Review Complete 02/27/2017  Allergen Reaction Noted  . Other  03/05/2014    Vitals: BP 136/80 (BP Location: Left Arm, Patient Position: Sitting)   Pulse 83   Ht 5\' 2"  (1.575 m)   Wt 177 lb (80.3 kg)   BMI 32.37 kg/m  Last Weight:  Wt Readings from Last 1 Encounters:  02/27/17 177 lb (80.3 kg)   Last Height:   Ht Readings from Last 1 Encounters:  02/27/17 5\' 2"  (1.575 m)    Physical exam: Exam: Gen: NAD,     CV: RRR, no MRG. No Carotid Bruits. No peripheral edema, warm, nontender Eyes: Conjunctivae clear without exudates or hemorrhage  Neuro: Detailed Neurologic Exam  Speech:    Speech is normal; fluent and spontaneous with normal comprehension.  Cognition:    The patient is oriented to person, place, and time;     recent and remote memory impaired;     language fluent;     Impaired attention, concentration, fund of knowledge Cranial Nerves:    The pupils are equal, round, and reactive to light. Attempted fundoscopic exam could not visualize.  Visual fields are full to finger confrontation.  Extraocular movements are intact. Trigeminal sensation is intact and the muscles of mastication are normal. The face is symmetric. The palate elevates in the midline. Hearing intact. Voice is normal. Shoulder shrug is normal. The tongue has normal motion without fasciculations.   Coordination:    No dysmetria  Gait:    Stable stride, good arm swing  Motor Observation:    No asymmetry, no atrophy, and no involuntary movements noted. Tone:    Normal muscle tone.    Posture:    Posture is normal. normal erect    Strength:    Strength is V/V in the upper and lower limbs.  Sensation: intact to LT     Reflex Exam:  DTR's:    Deep tendon reflexes in the upper and lower extremities are symmetrical bilaterally.   Toes:    The toes are equiv bilaterally.   Clonus:    Clonus is absent.      Assessment/Plan:  67 year old female with a PMHx of medication noncompliance, alcohol abuse (has cut back to one bottle of wine a day), Current marijuana smoker, Thalamic stroke, impaired fasting glucose level, hypothyroidism, anxiety and depression, ADD.  Patient has moderately-advanced microvascular ischemic changes in the brain and in 2016 an acute Thalamic infarct due to small-vessel disease.  She is non compliant with aspirin. Discussed she has many vascular risk factors including current marijuana smoker, previous cigarette smoker, medication noncompliance, alcohol abuse, elevated blood pressures, obesity, vascular ischemia in brain. Discussed, in detail and extensively, that given her vascular burden she is at increased risk for strokes and highly encouraged her to address her risk factors above. Also highly encouraged ASA daily for stroke prevention and following with pcp to check hgba1c and cholesterol (not fasting, says she will see her pcp very soon) and discuss ongoing alcohol and marijuana abuse and untreated depression and anxiety.  Sleep study: She snores and wakes herself up snoring,  she struggles to go back to sleep, she snorts, breathing irregularities when sleeping. She has irregular breathing, "snorting, stopping, escalating noise", previous stroke  MRI brain w/wo contrast to follow abnomal white matter in the brain and to look for seizure focus due to episodes of confusion.  EEG for episodes of confusion  Discussed her marijuana use which is  likely very much contributing to her confusion as is her untreated depression/anxiety and alcohol abuse. Says she smokes pot to help with her depression and anxiety. I recommended seeking professional treatment from a psychiatrist and therapist. Also marijuana may worsen these disorders as will her alcohol abuse. Alcohol abuse likely also contributing to her memory complaints and confusion, advised not to stop abruptly and even decreasing it significantly could cause withdrawal which can be deadly, she needs to see pcp for management, support groups, possibly inpatient detox.  I had a long d/w patient about her stroke, risk for recurrent stroke/TIAs, personally independently reviewed imaging studies and stroke evaluation results and answered questions. Start ASA 325mg  for secondary stroke prevention and maintain strict control of hypertension with blood pressure goal below 130/90, diabetes with hemoglobin A1c goal below 6.5% and lipids with LDL cholesterol goal below 70 mg/dL.. I also advised the patient to eat a healthy diet with plenty of whole grains, cereals, fruits and vegetables, exercise regularly and maintain ideal body weight .  Orders Placed This Encounter  Procedures  . MR BRAIN W WO CONTRAST  . Basic Metabolic Panel  . Ambulatory referral to Sleep Studies  . EEG     Toni Ill, MD  [facility] [address] [place], [place] [address]  Phone [phone] Fax [phone]  A total of 50 minutes was spent face-to-face with this patient. Over half this time was spent on counseling  patient on the stroke, microvascular brain disease, medication noncompliance, substance abuse, confusion and memory changes, depression and anxiety  diagnosis and different diagnostic and therapeutic options available.

## 2017-02-28 ENCOUNTER — Telehealth: Payer: Self-pay | Admitting: *Deleted

## 2017-02-28 DIAGNOSIS — I639 Cerebral infarction, unspecified: Secondary | ICD-10-CM | POA: Insufficient documentation

## 2017-02-28 DIAGNOSIS — I6381 Other cerebral infarction due to occlusion or stenosis of small artery: Secondary | ICD-10-CM | POA: Insufficient documentation

## 2017-02-28 DIAGNOSIS — Z9114 Patient's other noncompliance with medication regimen: Secondary | ICD-10-CM | POA: Insufficient documentation

## 2017-02-28 DIAGNOSIS — Z91148 Patient's other noncompliance with medication regimen for other reason: Secondary | ICD-10-CM | POA: Insufficient documentation

## 2017-02-28 DIAGNOSIS — F1210 Cannabis abuse, uncomplicated: Secondary | ICD-10-CM | POA: Insufficient documentation

## 2017-02-28 LAB — BASIC METABOLIC PANEL
BUN/Creatinine Ratio: 17 (ref 12–28)
BUN: 10 mg/dL (ref 8–27)
CALCIUM: 10 mg/dL (ref 8.7–10.3)
CO2: 25 mmol/L (ref 20–29)
CREATININE: 0.6 mg/dL (ref 0.57–1.00)
Chloride: 97 mmol/L (ref 96–106)
GFR, EST AFRICAN AMERICAN: 111 mL/min/{1.73_m2} (ref >59)
GFR, EST NON AFRICAN AMERICAN: 96 mL/min/{1.73_m2} (ref >59)
Glucose: 139 mg/dL — ABNORMAL HIGH (ref 65–99)
POTASSIUM: 4 mmol/L (ref 3.5–5.2)
Sodium: 138 mmol/L (ref 134–144)

## 2017-02-28 NOTE — Telephone Encounter (Signed)
-----   Message from Melvenia Beam, MD sent at 02/28/2017  7:42 AM EST ----- Glucose slightly elevated but she may have recently eaten so that may be ok. Otherwise normal

## 2017-02-28 NOTE — Telephone Encounter (Signed)
Spoke with patient. She is aware that her labs are normal except for glucose elevation. She stated she had just had a rather high carb meal prior the appt.

## 2017-03-01 ENCOUNTER — Telehealth: Payer: Self-pay | Admitting: Neurology

## 2017-03-01 NOTE — Telephone Encounter (Signed)
This referral has been sent to [facility] sleep.

## 2017-03-11 ENCOUNTER — Encounter: Payer: Self-pay | Admitting: Neurology

## 2017-03-11 ENCOUNTER — Ambulatory Visit (INDEPENDENT_AMBULATORY_CARE_PROVIDER_SITE_OTHER): Payer: Medicare Other | Admitting: Neurology

## 2017-03-11 VITALS — BP 145/87 | HR 76 | Ht 62.0 in | Wt 174.0 lb

## 2017-03-11 DIAGNOSIS — R351 Nocturia: Secondary | ICD-10-CM | POA: Insufficient documentation

## 2017-03-11 DIAGNOSIS — Z72821 Inadequate sleep hygiene: Secondary | ICD-10-CM | POA: Diagnosis not present

## 2017-03-11 DIAGNOSIS — G2581 Restless legs syndrome: Secondary | ICD-10-CM

## 2017-03-11 DIAGNOSIS — F1910 Other psychoactive substance abuse, uncomplicated: Secondary | ICD-10-CM | POA: Diagnosis not present

## 2017-03-11 DIAGNOSIS — G4730 Sleep apnea, unspecified: Secondary | ICD-10-CM

## 2017-03-11 DIAGNOSIS — F10982 Alcohol use, unspecified with alcohol-induced sleep disorder: Secondary | ICD-10-CM | POA: Diagnosis not present

## 2017-03-11 DIAGNOSIS — G4710 Hypersomnia, unspecified: Secondary | ICD-10-CM | POA: Diagnosis not present

## 2017-03-11 DIAGNOSIS — R0683 Snoring: Secondary | ICD-10-CM

## 2017-03-11 DIAGNOSIS — J449 Chronic obstructive pulmonary disease, unspecified: Secondary | ICD-10-CM | POA: Diagnosis not present

## 2017-03-11 DIAGNOSIS — J4551 Severe persistent asthma with (acute) exacerbation: Secondary | ICD-10-CM | POA: Insufficient documentation

## 2017-03-11 DIAGNOSIS — F1021 Alcohol dependence, in remission: Secondary | ICD-10-CM | POA: Insufficient documentation

## 2017-03-11 NOTE — Progress Notes (Signed)
SLEEP MEDICINE CLINIC   Provider:  Larey Seat, M D  Primary Care Physician:  Hoyt Koch, MD   Referring Provider:  Sarina Ill, MD and Marcial Pacas, MD at [facility]     Chief Complaint  Patient presents with  . New Patient (Initial Visit)   Patient seen here with her daughter  And significant other , status post left thalamic stroke . She  reports snoring and irregular sleep habits.  pt with significant other and daughter. pt has difficulty with sleep. usually goes to sleep easily but doesnt stay awake. often up from 1-2 am until 4-5 am. wake up frequently to void. RN Mardene Celeste   pt states that she snores and has interupted sleep.    HPI:  Toni Hicks is a 67 y.o. female , seen here as in a referral after she suffered a stroke, and has seen Dr Krista Blue and later Dr. Jaynee Eagles, who iniated this referral.  Toni Hicks had suffered a stroke but not within the last 6 months.  She has originally seen Dr. Annamaria Boots, and then most recently Dr. Sarina Ill.  She carries a diagnosis of allergic rhinitis, asthma, history of alcohol abuse, chronic pneumonia or recurrent pneumonias.  Impaired fasting blood sugar, mycoplasma pneumonia, restless legs and hypothyroidism.   The patient is currently using albuterol as needed, amlodipine, Advair, baby aspirin daily, Myrbetriq at night,  and has been ordered an AeroChamber plus inhaler ( not received yet ) .  Chief complaint according to patient : "I cannot get enough sleep- anxiety "  Sleep habits are as follows: The patient usually watches television or is reading in her living room before she retreats to her bedroom.  Her significant other and her family have known that she falls asleep while watching TV, and snoring.  The patient presented with excessive daytime sleepiness reflected and an Epworth sleepiness score of 18 points. There was a report of a dry mouth in the morning witnessed lo ud snoring and witnessed apneas by his spouse. The patient's regular  sleep routine is as follows; the patient normally goes to bed around 11 and falls asleep within 10-15 minutes he is awoken 3 times at night and nocturia and is woken by his alarm clock at 6 in the morning. 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   She is " reading" on an electronic device- and does play on face book, on her cell phone.  She retreats to bed at 10.30- 11.30 Pm, following her boyfriend by an hour.  They don't sleep in the same room.  They hear one another snoring. She sleeps for 2 hours through and wakes up for nocturia, and sometimes by her own snoring. She gets up at 7. 30 and has breakfast, returns to bed - tries again and again to get extra sleep. She has 3-5 nocturias.  She naps every day. She sleeps in increments amounting to 8-12 hours per 24 hours. There is no structure. She is entangled in her sheets, restlessly sleeping. She kicks a lot.    Sleep medical history and family sleep history: deminished lung capacity- almost yearly pneumonia, frequent bronchitis, COPD ?, apnea and snoring, RLS, audible breathing, frequent breathing.    Toni Hicks is a 67 y.o. female here as a referral from Dr. Sharlet Salina , and was seen by Sarina Ill. MD at [facility] on [phone] "for review of MRI of the brain". Patient here today with daughter who provides much information.  She is noncompliant with medication and has stopped taking her aspirin and not taking recommended meds such as statin for HDL. Smoked for 20 years, diabetes under control now but was not at one time, smokes marijuana, long history of alcohol use now maybe a bottle a day. She has moments of complete confusion. She has memory loss.Short term memory is worsening. She has untreated depression and anxiety. She snores and wakes herself up snoring, she struggles to go back to sleep, she snorts, breathing irregularities when sleeping. She has irregular breathing, "snorting, stopping, escalating noise" Spent extended time  with patient and daughter discussing her moderately advanced microvascular ischemic changes in the brain which puts her at risk for increased strokes, her previous stroke due to small-vessel disease, her non-complaince with medication is increasing her risk for another stroke and multiple other health problems"  Quoted from Dr Jaynee Eagles.    Social history:  Worked as a Chief Operating Officer, delivered newspapers, irregular hours late night , early mornings. Her daughter is here with her. Lives with her boyfriend.  Used alcohol to excess, marihuana. Smoker with COPD not asthma.     Review of Systems: Out of a complete 14 system review, the patient complains of only the following symptoms, and all other reviewed systems are negative.  sinusitis,  Wheezing, insomnia. RLS  Epworth score 8 with naps ! , Fatigue severity score 42   , depression score - she has not endorsed any.    Social History   Socioeconomic History  . Marital status: Widowed    Spouse name: Not on file  . Number of children: 2  . Years of education: 12  . Highest education level: Some college, no degree  Social Needs  . Financial resource strain: Not on file  . Food insecurity - worry: Not on file  . Food insecurity - inability: Not on file  . Transportation needs - medical: Not on file  . Transportation needs - non-medical: Not on file  Occupational History  . Occupation: Clinical biochemist    Comment: Semi-retired  Tobacco Use  . Smoking status: Former Research scientist (life sciences)  . Smokeless tobacco: Never Used  Substance and Sexual Activity  . Alcohol use: Yes    Alcohol/week: 0.0 oz    Comment: 3-5 glasses of wine day   . Drug use: No  . Sexual activity: Not on file  Other Topics Concern  . Not on file  Social History Narrative   [employer], [employer] [place] MA- photography   Married '73- 10 years divorced, married '86- 74 years divorced, Married '96- 62 years- widowed.   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   No caffeine    Family History  Problem Relation Age of Onset  . COPD Father   . Heart failure Father   . Coronary artery disease Father   . Cancer Mother        breast  . Hypertension Mother   . Dementia Mother   . Stroke Mother   . Breast cancer Mother     Past Medical History:  Diagnosis Date  . ADD (attention deficit disorder)   . Adult acne   . Alcohol abuse   . Allergic rhinitis   . Anxiety and depression   . Asthma   . Congenital pneumonia   . Hypoglycemia    no longer per pt   . Hypothyroidism   . Impaired fasting  blood sugar   . Mycoplasma pneumonia   . Restless leg syndrome   . Seasonal allergies   . Stroke [facility])     Past Surgical History:  Procedure Laterality Date  . DILATION AND CURETTAGE OF UTERUS     @ 67 years old  . WISDOM TOOTH EXTRACTION      Current Outpatient Medications  Medication Sig Dispense Refill  . ADVAIR DISKUS 250-50 MCG/DOSE AEPB INHALE 1 PUFF INTO THE LUNGS EVERY 12 (TWELVE) HOURS. 60 each 11  . albuterol (PROVENTIL HFA;VENTOLIN HFA) 108 (90 Base) MCG/ACT inhaler INHALE 2 PUFFS EVERY 6 HOURS AS NEEDED FOR WHEEZING OR SHORTNESS OF BREATH 18 Inhaler 3  . amLODipine (NORVASC) 5 MG tablet Take 1 tablet (5 mg total) daily by mouth. 90 tablet 1  . aspirin EC 81 MG tablet Take 81 mg by mouth daily.    [name] Kitchen b complex vitamins tablet Take 1 tablet by mouth as needed.      . fluocinonide cream (LIDEX) 0.05 % APPLY TOPICALLY 3 (THREE) TIMES DAILY AS NEEDED (SKIN IRRITATION). 120 g 0  . mirabegron ER (MYRBETRIQ) 50 MG TB24 tablet Take 1 tablet (50 mg total) daily by mouth. 90 tablet 1  . Spacer/Aero-Holding Chambers (AEROCHAMBER PLUS) inhaler Use as instructed 1 each 2   No current facility-administered medications for this visit.     Allergies as of 03/11/2017 - Review Complete 03/11/2017  Allergen Reaction Noted  . Other  03/05/2014    Vitals: BP (!) 145/87    Pulse 76   Ht 5\' 2"  (1.575 m)   Wt 174 lb (78.9 kg)   BMI 31.83 kg/m  Last Weight:  Wt Readings from Last 1 Encounters:  03/11/17 174 lb (78.9 kg)   IOX:BDZH mass index is 31.83 kg/m.     Last Height:   Ht Readings from Last 1 Encounters:  03/11/17 5\' 2"  (1.575 m)    Physical exam:  General: The patient is awake, alert and appears not in acute distress. The patient is well groomed. Head: Normocephalic, atraumatic. Neck is supple. Mallampati 4-5 ,  neck circumference:15.5 . Nasal airflow congested- all year round . Retrognathia is not seen.  Cardiovascular:  Regular rate and rhythm , without  murmurs or carotid bruit, and without distended neck veins. Respiratory: Lungs are wheezing . Skin:  Without evidence of edema, or rash Trunk: BMI is 32 . The patient's posture is erect .   Neurologic exam : The patient is awake and alert, oriented to place and time.   MOCA:No flowsheet data found. MMSE: MMSE - Mini Mental State Exam 05/14/2014  Orientation to time 5  Orientation to Place 5  Registration 3  Attention/ Calculation 5  Recall 3  Language- name 2 objects 2  Language- repeat 1  Language- follow 3 step command 3  Language- read & follow direction 1  Write a sentence 1  Copy design 1  Total score 30       Attention span & concentration ability appears normal.  Speech is fluent, without dysarthria, but dysphonia .  Mood and affect are appropriate.  Cranial nerves: Pupils are unequal in size- left larger 5 mm versus right 4 mm-  uses one contact in her right. both briskly reactive to light.  Funduscopic exam without evidence of pallor or edema. Extraocular movements  in vertical and horizontal planes intact and without nystagmus. Visual fields by finger perimetry are intact. Hearing to finger rub intact. Facial sensation intact to fine touch.  Facial motor  strength is symmetric and tongue moves midline. Shoulder shrug was symmetrical.  Motor exam:   Normal tone, muscle  bulk and symmetric strength in all extremities. Sensory:  Fine touch, pinprick and vibration were tested in all extremities. Proprioception tested in the upper extremities was normal. Coordination: Rapid alternating movements in the fingers/hands was normal. Finger-to-nose maneuver  normal without evidence of ataxia, dysmetria or tremor. Gait and station: Patient walks without assistive device. She reports hip pain, left  Limp.  Deep tendon reflexes: in the  upper and lower extremities are symmetric and intact.    Assessment:  After physical and neurologic examination, review of laboratory studies,  Personal review of imaging studies, reports of other /same Imaging studies, results of polysomnography and / or neurophysiology testing and pre-existing records as far as provided in visit., my assessment is    I spent over 30 minutes in family conference to get Mrs Bookwalter buy-in for a 14 day sleep boot camp.   1) insomnia.  There is a lot of room for improvement especially establishing a routine time to rise in the morning, Mrs. Garrison also was asked to eliminate the electronics from the bedroom and to switch them of 30 minutes before she starts her prep time for sleep.  She agreed on a bedtime between 1130 and midnight and to rise at 7:30 in the morning.  She feels she is mid meats and nap in daytime and we will try to reduce this to less than 45 minutes avoiding that she could go into REM sleep during the day.  I hope this will help with the fragmented sleep which she has spread out almost throughout the day.  2) Snoring and  witnessed apnea; the patient has multiple comorbidities that would make her prone to have obstructive sleep apnea, including tachypnea, nasal congestion or obstruction, pulmonary wheezing.  My concern is that she may have an overlap syndrome between obstructive sleep apnea and hypoxemia due to COPD.  She has never been officially diagnosed with COPD, but she does have the risk factors  and the limited chest wall movement was in end expiration that would speak for a diminished lung capacity.  3) nocturia, she has not responded to Myrbetriq and I think this is a sequelae of obstructive sleep apnea, currently untreated.  I will order an attended sleep study for this patient due to her multiple comorbidities and risk factors.  Mrs. Spoto has just started to reestablish herself with specialist following her 66th birthday and the start of Medicare coverage.   4) RLS   5) ongoing alcohol use, 0.35 liters a day of wine, cut down from 1.5 liters.   6)  She should still have a meeting for anxiety treatment in the future and she needs her hip looked at by an orthopedist.   The patient was advised of the nature of the diagnosed disorder , the treatment options and the  risks for general health and wellness arising from not treating the condition.   I spent more than 50 minutes of face to face time with the patient.  Greater than 50% of time was spent in counseling and coordination of care. We have discussed the diagnosis and differential and I answered the patient's questions.    Plan:  Treatment plan and additional workup : Attended SPLIT night sleep study with hypercapnia and hypoxemia measures.  Evaluate for COPD with pulmonology or PCP.  Will address sleep hygiene again in RV and see if she needs CPAP>  Larey Seat, MD 06/13/8157, 4:70 PM  Certified in Neurology by ABPN Certified in [place] by [facility] [address], [place], [place] [address]

## 2017-03-11 NOTE — Patient Instructions (Signed)

## 2017-03-14 ENCOUNTER — Other Ambulatory Visit: Payer: Self-pay | Admitting: Internal Medicine

## 2017-03-21 ENCOUNTER — Ambulatory Visit (INDEPENDENT_AMBULATORY_CARE_PROVIDER_SITE_OTHER): Payer: Medicare Other | Admitting: Diagnostic Neuroimaging

## 2017-03-21 DIAGNOSIS — R410 Disorientation, unspecified: Secondary | ICD-10-CM

## 2017-03-22 NOTE — Procedures (Signed)
   [facility]  EEG (ELECTROENCEPHALOGRAM) REPORT   STUDY DATE: 03/21/17 PATIENT NAME: LUCAS EXLINE DOB: 12-16-1950 MRN: [id number]  ORDERING CLINICIAN: Sarina Ill, MD   TECHNOLOGIST: Oneita Jolly TECHNIQUE: Electroencephalogram was recorded utilizing standard 10-20 system of lead placement and reformatted into average and bipolar montages.  RECORDING TIME: 21 minutes ACTIVATION: hyperventilation and photic stimulation  CLINICAL INFORMATION: 67 year old female with abnormal spells.  FINDINGS: Posterior dominant background rhythms, which attenuate with eye opening, ranging 9-11 hertz and 60-70 microvolts.  Rare intermixed theta and delta activity noted during drowsiness.  No focal, lateralizing, epileptiform activity or seizures are seen. Patient recorded in the awake and drowsy state. EKG channel shows regular rhythm of 70-75 beats per minute.   IMPRESSION:   Normal EEG in the awake and drowsy states.    INTERPRETING PHYSICIAN:  Penni Bombard, MD Certified in Neurology, Neurophysiology and Neuroimaging  [facility] [address], [place],  [address] [phone]

## 2017-03-25 ENCOUNTER — Telehealth: Payer: Self-pay | Admitting: *Deleted

## 2017-03-25 NOTE — Telephone Encounter (Signed)
-----   Message from Melvenia Beam, MD sent at 03/25/2017 12:09 PM EDT ----- eeg normal

## 2017-03-25 NOTE — Telephone Encounter (Signed)
Spoke with pt and informed her that her EEG was normal. She verbalized understanding clarified with RN that this test was to r/o evidence of seizures. She had no further questions. She stated that her MRI has been scheduled and then she will f/u with Dr. Jaynee Eagles.

## 2017-04-03 ENCOUNTER — Other Ambulatory Visit: Payer: Self-pay | Admitting: Internal Medicine

## 2017-04-04 MED ORDER — FLUOCINONIDE 0.05 % EX CREA: TOPICAL_CREAM | Freq: Three times a day (TID) | CUTANEOUS | 0 refills | Status: DC | PRN

## 2017-04-05 ENCOUNTER — Ambulatory Visit
Admission: RE | Admit: 2017-04-05 | Discharge: 2017-04-05 | Disposition: A | Discharge status: Home / Self Care | Payer: Medicare Other | Source: Ambulatory Visit | Attending: Neurology | Admitting: Neurology

## 2017-04-05 DIAGNOSIS — F1910 Other psychoactive substance abuse, uncomplicated: Secondary | ICD-10-CM

## 2017-04-05 DIAGNOSIS — I639 Cerebral infarction, unspecified: Secondary | ICD-10-CM

## 2017-04-05 DIAGNOSIS — F419 Anxiety disorder, unspecified: Secondary | ICD-10-CM

## 2017-04-05 DIAGNOSIS — I6381 Other cerebral infarction due to occlusion or stenosis of small artery: Secondary | ICD-10-CM

## 2017-04-05 DIAGNOSIS — R410 Disorientation, unspecified: Secondary | ICD-10-CM | POA: Diagnosis not present

## 2017-04-05 DIAGNOSIS — F32A Depression, unspecified: Secondary | ICD-10-CM

## 2017-04-05 DIAGNOSIS — R413 Other amnesia: Secondary | ICD-10-CM | POA: Diagnosis not present

## 2017-04-05 DIAGNOSIS — F329 Major depressive disorder, single episode, unspecified: Secondary | ICD-10-CM

## 2017-04-05 DIAGNOSIS — I679 Cerebrovascular disease, unspecified: Secondary | ICD-10-CM

## 2017-04-05 MED ORDER — GADOBENATE DIMEGLUMINE 529 MG/ML IV SOLN
15.0000 mL | Freq: Once | INTRAVENOUS | Status: AC | PRN
  Administered 2017-04-05: 15 mL via INTRAVENOUS

## 2017-04-08 ENCOUNTER — Ambulatory Visit (INDEPENDENT_AMBULATORY_CARE_PROVIDER_SITE_OTHER): Payer: Medicare Other | Admitting: Neurology

## 2017-04-08 DIAGNOSIS — F10982 Alcohol use, unspecified with alcohol-induced sleep disorder: Secondary | ICD-10-CM

## 2017-04-08 DIAGNOSIS — G4710 Hypersomnia, unspecified: Secondary | ICD-10-CM | POA: Diagnosis not present

## 2017-04-08 DIAGNOSIS — G4730 Sleep apnea, unspecified: Secondary | ICD-10-CM | POA: Diagnosis not present

## 2017-04-08 DIAGNOSIS — G2581 Restless legs syndrome: Secondary | ICD-10-CM

## 2017-04-08 DIAGNOSIS — Z72821 Inadequate sleep hygiene: Secondary | ICD-10-CM

## 2017-04-08 DIAGNOSIS — F1910 Other psychoactive substance abuse, uncomplicated: Secondary | ICD-10-CM

## 2017-04-08 DIAGNOSIS — R351 Nocturia: Secondary | ICD-10-CM

## 2017-04-08 DIAGNOSIS — J449 Chronic obstructive pulmonary disease, unspecified: Secondary | ICD-10-CM

## 2017-04-08 DIAGNOSIS — J4489 Other specified chronic obstructive pulmonary disease: Secondary | ICD-10-CM

## 2017-04-08 DIAGNOSIS — R0683 Snoring: Secondary | ICD-10-CM

## 2017-04-09 ENCOUNTER — Telehealth: Payer: Self-pay | Admitting: *Deleted

## 2017-04-09 NOTE — Telephone Encounter (Signed)
Spoke with patient and informed her MRI of the brain is unchanged from 2016 which is good news. There is no progression of her chronic microvascular changes. She verbalized understanding, appreciation of call.

## 2017-04-12 MED ORDER — ALPRAZOLAM 0.5 MG PO TABS: ORAL_TABLET | ORAL | 0 refills | Status: DC

## 2017-04-12 NOTE — Procedures (Signed)
PATIENT'S NAME:  Toni Hicks, Meditz DOB:      [birth year]      MR#:    [id number]     DATE OF RECORDING: 04/08/2017 REFERRING M.D.:  Pricilla Holm MD Study Performed:   Baseline Polysomnogram HISTORY:   Toni Hicks is a 67 y.o. female here as a referral from Dr. Sharlet Salina, and is seen by Sarina Ill, MD at [facility] on 03-01-2017 "for review of MRI of the brain". Patient here today with daughter who provides much information. She snores and wakes herself up snoring, she struggles to go back to sleep, she snorts, and breathing irregularities when sleeping. She has irregular breathing, "snorting, stopping, escalating noise". She is noncompliant with medication, has stopped taking her aspirin and not taking recommended meds such as statin for HDL. Smoked for 20 years, diabetes under control now but was not in the recent past, smokes marijuana, long history of alcohol use now reduced to maybe half a bottle a day. She has moments of complete confusion. She has memory loss. Short term memory is worsening. She has untreated depression and anxiety. The patient endorsed the Epworth Sleepiness Scale at 8/24 points.   The patient's weight 174 pounds with a height of 62 (inches), resulting in a BMI of 32. kg/m2. The patient's neck circumference measured 15.5 inches.  CURRENT MEDICATIONS: Albuterol, Amlodipine, Aspirin, B-Complex, Fluocinonide cream and Mirabegron.   PROCEDURE:  This is a multichannel digital polysomnogram utilizing the Somnostar 11.2 system.  Electrodes and sensors were applied and monitored per AASM Specifications.   EEG, EOG, Chin and Limb EMG, were sampled at 200 Hz.  ECG, Snore and Nasal Pressure, Thermal Airflow, Respiratory Effort, CPAP Flow and Pressure, Oximetry was sampled at 50 Hz. Digital video and audio were recorded.      BASELINE STUDY: Lights Out was at 22:47 and Lights On at 04:52.  Total recording time (TRT) was 365.5 minutes, with a total sleep time (TST) of 128 minutes.   The  patient's sleep latency was 89.5 minutes.  REM latency was 276 minutes.  The sleep efficiency was very poor at  35.0%.     SLEEP ARCHITECTURE: WASO (Wake after sleep onset) was 166 minutes.  There were 7 minutes in Stage N1, 103 minutes Stage N2, 0 minutes Stage N3 and 18 minutes in Stage REM.  The percentage of Stage N1 was 5.5%, Stage N2 was 80.5%, Stage N3 was 0% and Stage R (REM sleep) was 14.1%.   RESPIRATORY ANALYSIS:  There were a total of 24 respiratory events:  14 obstructive apneas, 10 hypopneas with 0 respiratory event related arousals (RERAs). The total APNEA/HYPOPNEA INDEX (AHI) was 11.25/hour and the total RESPIRATORY DISTURBANCE INDEX was 11.25 /hour.  19 events occurred in REM sleep and 10 events in NREM. The REM AHI was 63.3 /hour, versus a non-REM AHI of 2.7. The patient spent 5 minutes of total sleep time in the supine position and 123 minutes in non-supine. The supine AHI was 0.0 versus a non-supine AHI of 11.7/h.  OXYGEN SATURATION & C02:  The Wake baseline 02 saturation was 94%, with the lowest being 53%. Time spent below 89% saturation equaled 29 minutes.  Average End Tidal CO2 during sleep was 32.9 torr.  No time was spent over 50 torr and the peak was 42 torr.   PERIODIC LIMB MOVEMENTS:  The patient had a total of 0 Periodic Limb Movements.  The arousals were noted as: 41 were spontaneous, 0 were associated with PLMs, and 23 were associated  with respiratory events. Audio and video analysis did not show any abnormal phonations or vocalizations. The patient repeatedly removed the nasal cannula, was restless and had overall little sustained sleep.  The patient took 2 bathroom breaks. EKG was in keeping with normal sinus rhythm (NSR), but PVCs were noted. The EEG was high amplitude, but not slowed. No epileptiform activity noted.   Post-study, the patient indicated that sleep was worse than usual.   IMPRESSION:  1. Mild REM sleep dependent Obstructive Sleep Apnea (OSA) with an  AHI under 11/h, REM accentuated to 63/h. Hypoxemia- just under 30 minutes, but amounting to over 20% of the brief sleep time.  2. Repetitive Intrusions of Sleep.  RECOMMENDATIONS:  1. Sleep hygiene has to improve further in order to gain better sleep.  Patient has received instructions for 14 day sleep boot camp.  2. Advise either full-night, attended CPAP titration study to optimize therapy or auto-titration.  3. Further information regarding OSA may be obtained from [employer] (www.sleepfoundation.org) or American Sleep Apnea Association (www.sleepapnea.org). 4. Avoid caffeine-containing beverages and chocolate, alcohol and nicotine within 4 hours of bedtime. 5. Please refer to dedicated sleep psychology / cognitive behavior therapist for insomnia concern.   6. A follow up appointment will be scheduled in the Sleep Clinic at [facility]. The referring provider will be notified of the results.      I certify that I have reviewed the entire raw data recording prior to the issuance of this report in accordance with the Standards of Accreditation of the American Academy of Sleep Medicine (AASM)  Larey Seat, MD    04-12-2017  Diplomat, American Board of Psychiatry and Neurology  Diplomat, American Board of [place] Director, [employer] [facility] [employer]

## 2017-04-12 NOTE — Addendum Note (Signed)
Addended by: Larey Seat on: 04/12/2017 02:03 PM   Modules accepted: Orders

## 2017-04-15 ENCOUNTER — Telehealth: Payer: Self-pay

## 2017-04-15 NOTE — Telephone Encounter (Signed)
I called pt to discuss her sleep study results. No answer, left a message asking her to call me back. 

## 2017-04-15 NOTE — Telephone Encounter (Signed)
-----   Message from Larey Seat, MD sent at 04/12/2017  2:03 PM EDT ----- Severe Insomnia , mild apnea with hypoxemia.  Her sleep may improve with CPAP treatment, main treatment needs to be SLEEP HYGIENE improvement. I ordered CPAP return.

## 2017-04-18 ENCOUNTER — Telehealth: Payer: Self-pay | Admitting: Internal Medicine

## 2017-04-18 MED ORDER — FLUTICASONE-SALMETEROL 250-50 MCG/DOSE IN AEPB: INHALATION_SPRAY | RESPIRATORY_TRACT | 0 refills | Status: DC

## 2017-04-18 NOTE — Telephone Encounter (Signed)
Called the patient with results and she states that she has already spoke with someone who has went over the results with her and she is scheduled for a titration test. Pt was appreciative for the follow up

## 2017-04-18 NOTE — Telephone Encounter (Signed)
Copied from [place] [id number]. Topic: Quick Communication - Rx Refill/Question >> Apr 18, 2017  3:52 PM Boyd Kerbs wrote:  Medication: ADVAIR DISKUS 250-50 MCG/DOSE AEPB [facility] have [facility] office [facility]   She is leaving town and needs today   Has the patient contacted their pharmacy? Yes.   (Agent: If no, request that the patient contact the pharmacy for the refill.) Preferred Pharmacy (with phone number or street name):  [id number] [place], [place] - [address] [place] [address] Phone: [phone] Fax: [phone]   Agent: Please be advised that RX refills may take up to 3 business days. We ask that you follow-up with your pharmacy.

## 2017-05-12 ENCOUNTER — Other Ambulatory Visit: Payer: Self-pay | Admitting: Internal Medicine

## 2017-05-14 ENCOUNTER — Ambulatory Visit (INDEPENDENT_AMBULATORY_CARE_PROVIDER_SITE_OTHER): Payer: Medicare Other | Admitting: Neurology

## 2017-05-14 ENCOUNTER — Other Ambulatory Visit: Payer: Self-pay | Admitting: Internal Medicine

## 2017-05-14 DIAGNOSIS — G4761 Periodic limb movement disorder: Secondary | ICD-10-CM

## 2017-05-14 DIAGNOSIS — G2581 Restless legs syndrome: Secondary | ICD-10-CM

## 2017-05-14 DIAGNOSIS — F10982 Alcohol use, unspecified with alcohol-induced sleep disorder: Secondary | ICD-10-CM

## 2017-05-14 DIAGNOSIS — G4733 Obstructive sleep apnea (adult) (pediatric): Secondary | ICD-10-CM

## 2017-05-14 DIAGNOSIS — J449 Chronic obstructive pulmonary disease, unspecified: Secondary | ICD-10-CM

## 2017-05-14 DIAGNOSIS — R0683 Snoring: Secondary | ICD-10-CM

## 2017-05-14 DIAGNOSIS — Z72821 Inadequate sleep hygiene: Secondary | ICD-10-CM

## 2017-05-14 DIAGNOSIS — G4710 Hypersomnia, unspecified: Secondary | ICD-10-CM

## 2017-05-14 DIAGNOSIS — R351 Nocturia: Secondary | ICD-10-CM

## 2017-05-14 DIAGNOSIS — G4730 Sleep apnea, unspecified: Secondary | ICD-10-CM

## 2017-05-14 DIAGNOSIS — F1910 Other psychoactive substance abuse, uncomplicated: Secondary | ICD-10-CM

## 2017-05-21 NOTE — Addendum Note (Signed)
Addended by: Larey Seat on: 05/21/2017 06:22 PM   Modules accepted: Orders

## 2017-05-21 NOTE — Procedures (Signed)
PATIENT'S NAME:  Charika, Mikelson DOB:      [birth year]      MR#:    [id number]     DATE OF RECORDING: 05/14/2017 REFERRING M.D.:  Sarina Ill. MD ; Pricilla Holm, M.D. Study Performed:   Titration to Positive Airway Pressure HISTORY: Toni Hicks is a 67 y.o. female patient of Dr. Sharlet Salina, and is seen by Sarina Ill, MD at [facility]. Her PSG from 04/08/17 showed the total APNEA/HYPOPNEA INDEX (AHI) was 11.25/hour and the REM AHI was 63.3/h, all sleep in non-supine with the lowest oxygen saturation at 53%. Total Time spent below 89% 02 saturation equaled 29 minutes. She snores and wakes herself up snoring, she struggles to go back to sleep, she snorts, and breathing irregularities when sleeping. She has moments of complete confusion. She has memory loss. She has untreated depression and anxiety.  The patient endorsed the Epworth Sleepiness Scale at 8/24 points.  The patient's weight 174 pounds with a height of 62 (inches), resulting in a BMI of 32.0 kg/m2.The patient's neck circumference measured 15 inches.  CURRENT MEDICATIONS: Xanax for sleep study. Albuterol, Amlodipine, Aspirin, B-Complex, Fluocinonide cream and Myrbetron . PROCEDURE:  This is a multichannel digital polysomnogram utilizing the SomnoStar 11.2 system.  Electrodes and sensors were applied and monitored per AASM Specifications.   EEG, EOG, Chin and Limb EMG, were sampled at 200 Hz.  ECG, Snore and Nasal Pressure, Thermal Airflow, Respiratory Effort, CPAP Flow and Pressure, Oximetry was sampled at 50 Hz. Digital video and audio were recorded.      CPAP was initiated at 5 cmH20 with heated humidity per AASM split night standards and pressure was advanced to 9 cmH20 because of hypopneas, apneas and desaturations.  At a PAP pressure of 9 cmH20, there was a reduction of the AHI to 0.0 with improvement of sleep apnea. A Respironics ' DreamWear' nasal pillow of small size was provided.   Lights Out was at 22:59 and Lights On at 05:07. Total  recording time (TRT) was 368.5 minutes, with a total sleep time (TST) of 317 minutes. The patient's sleep latency was 20.5 minutes. REM latency was 157.5 minutes.  The sleep efficiency was 86.0 %.    SLEEP ARCHITECTURE: WASO (Wake after sleep onset) was 38 minutes.  There were 11.5 minutes in Stage N1, 237 minutes Stage N2, 0 minutes Stage N3 and 68.5 minutes in Stage REM.  The percentage of Stage N1 was 3.6%, Stage N2 was 74.8%, Stage N3 was 0% and Stage R (REM sleep) was 21.6%.   RESPIRATORY ANALYSIS:  There was a total of 3 respiratory events: 0 apneas and 3 hypopneas with 0 respiratory event related arousals (RERAs).     The total APNEA/HYPOPNEA INDEX (AHI) was 0.6 /hour and the total RESPIRATORY DISTURBANCE INDEX was 0.6/hour.  2 events occurred in REM sleep and 1 event in NREM. The REM AHI was 1.8 /hour versus a non-REM AHI of 0.2 /hour. The patient spent only 5.5 minutes of total sleep time in the supine position and 312 minutes in non-supine. The supine AHI was 0.0, versus a non-supine AHI of 0.6.  OXYGEN SATURATION & C02:  The baseline 02 saturation was 92%, with the lowest being 78%. Time spent below 89% saturation equaled 26 minutes.  PERIODIC LIMB MOVEMENTS:  The patient had a total of 99 Periodic Limb Movements. The Periodic Limb Movement (PLM) index was 18.7 and the PLM Arousal index was 7.6 /hour.  The arousals were noted as: 40 were spontaneous, 40  were associated with PLMs, and only 2 were associated with respiratory events. Audio and video analysis did not show any abnormal or unusual movements, behaviors, phonations or vocalizations.  The patient took 2 bathroom breaks. Snoring was alleviated. EKG was in keeping with normal sinus rhythm (NSR).  DIAGNOSIS Obstructive Sleep Apnea responding to CPAP at 9 cm water, without EPR and under heated humidity. The patient was fitted with a Respironics Dream Wear (Small) apparatus. 1. Post-study, the patient indicated that sleep was of better  quality than usual.  2. Xanax was provided to help the patient sleep in the lab environment.   PLANS/RECOMMENDATIONS: I will order an auto titration capable CPAP at the above named settings. CPAP at 9 cm water, without EPR and under heated humidity. The patient was fitted with a Respironics Dream Wear (Small) apparatus.   1. Educate patient in sleep hygiene measures.  2. Maintain lean body weight. 3. The patient should avoid evening sedatives, hypnotics, and alcohol beverage consumption. 4. CPAP compliance is use defined as use of CPAP for 4 hours or more each night. A revisit is scheduled for 60-90 days after CPAP therapy began.  A follow up appointment will be scheduled in the Sleep Clinic at [facility].   Please call [phone] with any questions.      I certify that I have reviewed the entire raw data recording prior to the issuance of this report in accordance with the Standards of Accreditation of the American Academy of Sleep Medicine (AASM)    Larey Seat, M.D.   05-21-2017  Diplomat, American Board of Psychiatry and Neurology  Diplomat, [place] of Sleep Medicine Medical Director, [place] [facility]

## 2017-05-23 ENCOUNTER — Telehealth: Payer: Self-pay

## 2017-05-23 NOTE — Telephone Encounter (Signed)
I called pt. I advised pt that Dr. Brett Fairy reviewed their sleep study results and found that pt did well with the cpap. Dr. Brett Fairy recommends that pt start a cpap at home. I reviewed PAP compliance expectations with the pt. Pt is agreeable to starting a CPAP. I advised pt that an order will be sent to a DME, [facility], and [facility] will call the pt within about one week after they file with the pt's insurance. [facility] will show the pt how to use the machine, fit for masks, and troubleshoot the CPAP if needed. Pt was not able to schedule a follow up at this time but was encouraged to call back to schedule her follow up Pt verbalized understanding to arrive 15 minutes early and bring their CPAP. A letter with all of this information in it will be mailed to the pt as a reminder. I verified with the pt that the address we have on file is correct. Pt verbalized understanding of results. Pt had no questions at this time but was encouraged to call back if questions arise.

## 2017-05-23 NOTE — Telephone Encounter (Signed)
-----   Message from Larey Seat, MD sent at 05/21/2017  6:22 PM EDT ----- DIAGNOSIS Obstructive Sleep Apnea responding to CPAP at 9 cm water, without  EPR and under heated humidity. The patient was fitted with a  Respironics Dream Wear (Small) apparatus. 1. Post-study, the patient indicated that sleep was of better  quality than usual.  2. Xanax was provided to help the patient sleep in the lab  environment.   PLANS/RECOMMENDATIONS: I will order an auto titration capable CPAP at the above named  settings. CPAP at 9 cm water, without EPR and under heated  humidity. The patient was fitted with a Respironics Dream Wear  (Small) apparatus.  1. Educate patient in sleep hygiene measures.  2. Maintain lean body weight. 3. The patient should avoid evening sedatives, hypnotics, and  alcohol beverage consumption. 4. CPAP compliance is use defined as use of CPAP for 4 hours or  more each night. A revisit is scheduled for 60-90 days after CPAP  therapy began.

## 2017-05-28 ENCOUNTER — Other Ambulatory Visit: Payer: Self-pay | Admitting: Internal Medicine

## 2017-06-01 ENCOUNTER — Other Ambulatory Visit: Payer: Self-pay | Admitting: Internal Medicine

## 2017-07-25 ENCOUNTER — Ambulatory Visit (INDEPENDENT_AMBULATORY_CARE_PROVIDER_SITE_OTHER): Payer: Medicare Other | Admitting: Family Medicine

## 2017-07-25 ENCOUNTER — Encounter: Payer: Self-pay | Admitting: Family Medicine

## 2017-07-25 VITALS — BP 130/76 | HR 105 | Temp 98.2°F | Ht 62.0 in

## 2017-07-25 DIAGNOSIS — J069 Acute upper respiratory infection, unspecified: Secondary | ICD-10-CM

## 2017-07-25 MED ORDER — AZITHROMYCIN 250 MG PO TABS: ORAL_TABLET | ORAL | 0 refills | Status: DC

## 2017-07-25 NOTE — Patient Instructions (Signed)
Nice to meet you   Please try things such as zyrtec-D or allegra-D which is an antihistamine and decongestant.   Please try afrin which will help with nasal congestion but use for only three days.   Please also try using a netti pot on a regular occasion.  Honey can help with a sore throat.

## 2017-07-25 NOTE — Progress Notes (Signed)
Toni Hicks - 67 y.o. female MRN [id number]  Date of birth: 1950/11/19  SUBJECTIVE:  Including CC & ROS.  Chief Complaint  Patient presents with  . Sinusitis    Toni Hicks is a 67 y.o. female that is presenting with sinus pressure. Ongoing for three days. Admits to sinus drainage, headaches and fevers. She has been taking benadryl and sudafed. She has not been around with similar symptoms. She has been using a neti pot with no improvement. Admits to diarrhea for two days. Denies abdominal pain.     Review of Systems  Constitutional: Negative for fever.  HENT: Positive for sinus pressure.   Respiratory: Negative for cough.   Cardiovascular: Negative for chest pain.  Gastrointestinal: Negative for abdominal pain.    HISTORY: Past Medical, Surgical, Social, and Family History Reviewed & Updated per EMR.   Pertinent Historical Findings include:  Past Medical History:  Diagnosis Date  . ADD (attention deficit disorder)   . Adult acne   . Alcohol abuse   . Allergic rhinitis   . Anxiety and depression   . Asthma   . Congenital pneumonia   . Hypoglycemia    no longer per pt   . Hypothyroidism   . Impaired fasting blood sugar   . Mycoplasma pneumonia   . Restless leg syndrome   . Seasonal allergies   . Stroke [facility])     Past Surgical History:  Procedure Laterality Date  . DILATION AND CURETTAGE OF UTERUS     @ 67 years old  . WISDOM TOOTH EXTRACTION      Allergies  Allergen Reactions  . Other     Can not eat carbohydrates without protein, is allergic to certain foods but can take in certain doses    Family History  Problem Relation Age of Onset  . COPD Father   . Heart failure Father   . Coronary artery disease Father   . Cancer Mother        breast  . Hypertension Mother   . Dementia Mother   . Stroke Mother   . Breast cancer Mother      Social History   Socioeconomic History  . Marital status: Widowed    Spouse name: Not on file  . Number of  children: 2  . Years of education: 24  . Highest education level: Some college, no degree  Occupational History  . Occupation: Clinical biochemist    Comment: Semi-retired  Social Needs  . Financial resource strain: Not on file  . Food insecurity:    Worry: Not on file    Inability: Not on file  . Transportation needs:    Medical: Not on file    Non-medical: Not on file  Tobacco Use  . Smoking status: Former Research scientist (life sciences)  . Smokeless tobacco: Never Used  Substance and Sexual Activity  . Alcohol use: Yes    Alcohol/week: 0.0 oz    Comment: 3-5 glasses of wine day   . Drug use: No  . Sexual activity: Not on file  Lifestyle  . Physical activity:    Days per week: Not on file    Minutes per session: Not on file  . Stress: Not on file  Relationships  . Social connections:    Talks on phone: Not on file    Gets together: Not on file    Attends religious service: Not on file    Active member of club or organization: Not on file    Attends  meetings of clubs or organizations: Not on file    Relationship status: Not on file  . Intimate partner violence:    Fear of current or ex partner: Not on file    Emotionally abused: Not on file    Physically abused: Not on file    Forced sexual activity: Not on file  Other Topics Concern  . Not on file  Social History Narrative   HSG, [employer]  MA- photography   Married '73- 10 years divorced, married '86- 11 years divorced, Married '96- 54 years- widowed.   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   No caffeine     PHYSICAL EXAM:  VS: BP 130/76 (BP Location: Left Arm, Patient Position: Sitting, Cuff Size: Normal)   Pulse (!) 105   Temp 98.2 F (36.8 C) (Oral)   Ht 5\' 2"  (1.575 m)   SpO2 99%   BMI 31.83 kg/m  Physical Exam Gen: NAD, alert, cooperative with exam,  ENT: normal lips, normal nasal mucosa, tympanic membranes clear  and intact bilaterally, normal oropharynx,  Eye: normal EOM, normal conjunctiva and lids CV:  no edema, +2 pedal pulses, regular rate and rhythm, S1-S2   Resp: no accessory muscle use, non-labored, clear to auscultation bilaterally, no crackles or wheezes Skin: no rashes, no areas of induration  Neuro: normal tone, normal sensation to touch Psych:  normal insight, alert and oriented MSK: Normal gait, normal strength        ASSESSMENT & PLAN:   Viral upper respiratory tract infection Likely viral in nature  - counseled on supportive care  - azithro on hand if symptoms are not improving.

## 2017-07-27 DIAGNOSIS — J069 Acute upper respiratory infection, unspecified: Secondary | ICD-10-CM | POA: Insufficient documentation

## 2017-07-27 NOTE — Assessment & Plan Note (Signed)
Likely viral in nature  - counseled on supportive care  - azithro on hand if symptoms are not improving.

## 2017-08-03 ENCOUNTER — Other Ambulatory Visit: Payer: Self-pay | Admitting: Internal Medicine

## 2017-08-07 ENCOUNTER — Other Ambulatory Visit: Payer: Self-pay | Admitting: Internal Medicine

## 2017-08-08 ENCOUNTER — Encounter: Payer: Self-pay | Admitting: Neurology

## 2017-08-21 ENCOUNTER — Other Ambulatory Visit: Payer: Self-pay | Admitting: Neurology

## 2017-08-21 NOTE — Telephone Encounter (Signed)
Rx registry checked. Last fill date is 04/12/17 for #30. Next OV is 09/17/17.

## 2017-08-28 ENCOUNTER — Ambulatory Visit: Payer: Medicare Other | Admitting: Neurology

## 2017-08-30 ENCOUNTER — Other Ambulatory Visit: Payer: Self-pay | Admitting: Internal Medicine

## 2017-09-16 ENCOUNTER — Encounter: Payer: Self-pay | Admitting: Neurology

## 2017-09-17 ENCOUNTER — Encounter: Payer: Self-pay | Admitting: Neurology

## 2017-09-17 ENCOUNTER — Ambulatory Visit (INDEPENDENT_AMBULATORY_CARE_PROVIDER_SITE_OTHER): Payer: Medicare Other | Admitting: Neurology

## 2017-09-17 ENCOUNTER — Other Ambulatory Visit: Payer: Self-pay | Admitting: Neurology

## 2017-09-17 VITALS — BP 125/84 | HR 69 | Ht 61.5 in | Wt 169.0 lb

## 2017-09-17 DIAGNOSIS — I679 Cerebrovascular disease, unspecified: Secondary | ICD-10-CM

## 2017-09-17 DIAGNOSIS — G4733 Obstructive sleep apnea (adult) (pediatric): Secondary | ICD-10-CM | POA: Diagnosis not present

## 2017-09-17 DIAGNOSIS — G479 Sleep disorder, unspecified: Secondary | ICD-10-CM | POA: Diagnosis not present

## 2017-09-17 DIAGNOSIS — I6381 Other cerebral infarction due to occlusion or stenosis of small artery: Secondary | ICD-10-CM

## 2017-09-17 DIAGNOSIS — R0981 Nasal congestion: Secondary | ICD-10-CM | POA: Diagnosis not present

## 2017-09-17 NOTE — Progress Notes (Signed)
[facility]    Provider:  Dr Jaynee Eagles Referring Provider: Hoyt Koch, * Primary Care Physician:  Hoyt Koch, MD  CC:  Review MRI brain, also OSA now on cpap  Interval history 09/17/2017: Reviewed MRI brain images stable still with advanced white matter disease. EEG was normal. She saw Dr. Brett Fairy for sleep apnea and using cpap.  She feels better on the cpap when she can get 7 hours of sleep. She is compliant on aspirin. She is having difficulty wth congestion making it difficult to use cpap and sleep will refer to [facility].   HPI:  Toni Hicks is a 67 y.o. female here as a referral from Dr. Sharlet Salina for review of MRI of the brain. Patient here today with daughter who provides much information. She is noncompliant with medication and has stopped taking her aspirin and not taking recommended meds such as statin for HDL. Smoked for 20 years, diabetes under control now but was not at one time, smokes marijuana, long history of alcohol use now maybe a bottle a day.She has moments of complete confusion. She has memory loss.Short term memory is worsening. She has untreated depression and anxiety. She snores and wakes herself up snoring, she struggles to go back to sleep, she snorts, breathing irregularities when sleeping. She has irregular breathing, "snorting, stopping, escalating noise" Spent extended time with patient and daughter discussing her moderately advanced microvascular ischemic changes in the brain which puts her at risk for increased strokes, her previous stroke due to small-vessel disease, her non-complaince with medication is increasing her risk for another stroke and multiple other health problems.   Reviewed notes, labs and imaging from outside physicians, which showed:  MRI brain 02/2014: personally reviewed images and agree with the following. Also reviewed images with patient and daughter. IMPRESSION: 1. Acute left thalamic  infarct. 2. Moderate chronic small vessel ischemic disease.  12/2015: hgba1c 5.8, ldl 80, B12 517  Review of Systems: Patient complains of symptoms per HPI as well as the following symptoms: stroke. Pertinent negatives and positives per HPI. All others negative.   Social History   Socioeconomic History  . Marital status: Widowed    Spouse name: has boyfriend Sam  . Number of children: 2  . Years of education: 40  . Highest education level: Some college, no degree  Occupational History  . Occupation: Clinical biochemist    Comment: Semi-retired  Social Needs  . Financial resource strain: Not on file  . Food insecurity:    Worry: Not on file    Inability: Not on file  . Transportation needs:    Medical: Not on file    Non-medical: Not on file  Tobacco Use  . Smoking status: Former Smoker    Types: Cigarettes    Last attempt to quit: 1980    Years since quitting: 39.7  . Smokeless tobacco: Never Used  Substance and Sexual Activity  . Alcohol use: Yes    Alcohol/week: 0.0 standard drinks    Comment: 3-5 glasses of wine day   . Drug use: No  . Sexual activity: Not on file  Lifestyle  . Physical activity:    Days per week: Not on file    Minutes per session: Not on file  . Stress: Not on file  Relationships  . Social connections:    Talks on phone: Not on file    Gets together: Not on file    Attends religious service: Not on file  Active member of club or organization: Not on file    Attends meetings of clubs or organizations: Not on file    Relationship status: Not on file  . Intimate partner violence:    Fear of current or ex partner: Not on file    Emotionally abused: Not on file    Physically abused: Not on file    Forced sexual activity: Not on file  Other Topics Concern  . Not on file  Social History Narrative   HSG, [employer] MA- photography   Married '73- 10 years divorced, married '86- 52 years divorced, Married '96- 65 years- widowed.    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   Caffeine occasional    Family History  Problem Relation Age of Onset  . COPD Father   . Heart failure Father   . Coronary artery disease Father   . Cancer Mother        breast  . Hypertension Mother   . Dementia Mother   . Stroke Mother   . Breast cancer Mother     Past Medical History:  Diagnosis Date  . ADD (attention deficit disorder)   . Adult acne   . Alcohol abuse   . Allergic rhinitis   . Anxiety and depression   . Asthma   . Congenital pneumonia   . Hypoglycemia    no longer per pt   . Hypothyroidism   . Impaired fasting blood sugar   . Mycoplasma pneumonia   . Restless leg syndrome   . Seasonal allergies   . Stroke [facility])     Past Surgical History:  Procedure Laterality Date  . DILATION AND CURETTAGE OF UTERUS     @ 67 years old  . WISDOM TOOTH EXTRACTION      Current Outpatient Medications  Medication Sig Dispense Refill  . albuterol (PROVENTIL HFA;VENTOLIN HFA) 108 (90 Base) MCG/ACT inhaler INHALE 2 PUFFS EVERY 6 HOURS AS NEEDED FOR WHEEZING OR SHORTNESS OF BREATH 18 Inhaler 3  . ALPRAZolam (XANAX) 0.5 MG tablet PLEASE SEE ATTACHED FOR DETAILED DIRECTIONS 30 tablet 0  . amLODipine (NORVASC) 5 MG tablet TAKE 1 TABLET (5 MG TOTAL) DAILY BY MOUTH. 90 tablet 1  . aspirin EC 81 MG tablet Take 81 mg by mouth daily.    [name] Kitchen b complex vitamins tablet Take 1 tablet by mouth as needed.      . fluocinonide cream (LIDEX) 0.05 % APPLY TOPICALLY 3 (THREE) TIMES DAILY AS NEEDED (SKIN IRRITATION). 120 g 0  . WIXELA INHUB 250-50 MCG/DOSE AEPB INHALE 1 PUFF BY MOUTH EVERY 12 HOURS 60 each 1  . Spacer/Aero-Holding Chambers (AEROCHAMBER PLUS) inhaler Use as instructed 1 each 2   No current facility-administered medications for this visit.     Allergies as of 09/17/2017 - Review Complete 09/17/2017  Allergen Reaction Noted  . Other   03/05/2014    Vitals: BP 125/84 (BP Location: Right Arm, Patient Position: Sitting)   Pulse 69   Ht 5' 1.5" (1.562 m)   Wt 169 lb (76.7 kg)   BMI 31.42 kg/m  Last Weight:  Wt Readings from Last 1 Encounters:  09/17/17 169 lb (76.7 kg)   Last Height:   Ht Readings from Last 1 Encounters:  09/17/17 5' 1.5" (1.562 m)    Physical exam: Exam: Gen: NAD,     CV: RRR, no MRG. No Carotid Bruits.  No peripheral edema, warm, nontender Eyes: Conjunctivae clear without exudates or hemorrhage  Neuro: Detailed Neurologic Exam  Speech:    Speech is normal; fluent and spontaneous with normal comprehension.  Cognition:    The patient is oriented to person, place, and time;     recent and remote memory impaired;     language fluent;     Impaired attention, concentration, fund of knowledge Cranial Nerves:    The pupils are equal, round, and reactive to light. Attempted fundoscopic exam could not visualize.  Visual fields are full to finger confrontation. Extraocular movements are intact. Trigeminal sensation is intact and the muscles of mastication are normal. The face is symmetric. The palate elevates in the midline. Hearing intact. Voice is normal. Shoulder shrug is normal. The tongue has normal motion without fasciculations.   Coordination:    No dysmetria  Gait:    Stable stride, good arm swing  Motor Observation:    No asymmetry, no atrophy, and no involuntary movements noted. Tone:    Normal muscle tone.    Posture:    Posture is normal. normal erect    Strength:    Strength is V/V in the upper and lower limbs.      Sensation: intact to LT     Reflex Exam:  DTR's:    Deep tendon reflexes in the upper and lower extremities are symmetrical bilaterally.   Toes:    The toes are equiv bilaterally.   Clonus:    Clonus is absent.      Assessment/Plan:  67 year old female with a PMHx of medication noncompliance, alcohol abuse (has cut back to one bottle of wine a day),  Current marijuana smoker, Thalamic stroke, impaired fasting glucose level, hypothyroidism, anxiety and depression, ADD.   She is having difficulty wth congestion making it difficult to use cpap and sleep will refer to [facility].    Patient has moderately-advanced microvascular ischemic changes in the brain which are stable on repeat MRI also chronic Thalamic infarct due to small-vessel disease.  She is now compliant with aspirin per patient. Discussed she has many vascular risk factors including current marijuana smoker, previous cigarette smoker, previous medication noncompliance, alcohol abuse, elevated blood pressures, obesity, vascular ischemia in brain. Discussed, in detail and extensively, that given her vascular burden she is at increased risk for strokes and highly encouraged her to address her risk factors above. Also highly encouraged ASA 325 mg for stroke prevention and following with pcp to check hgba1c and cholesterol (not fasting, says she will see her pcp very soon) and discuss ongoing alcohol and marijuana abuse and untreated depression and anxiety.  Sleep study:  Now on cpap for OSA, discussed with sleep team today she is having congestion, reviewed her sleep report and she is going to follow up with the sleep team arranged appointments  MRI brain w/wo contrast to follow abnomal white matter in the brain and to look for seizure focus due to episodes of confusion was stable  EEG for episodes of confusion: was normal  Follow up with Dr. Brett Fairy for OSA  Discussed her marijuana use which is  likely very much contributing to her confusion as is her untreated depression/anxiety and alcohol abuse. Says she smokes pot to help with her depression and anxiety. I recommended seeking professional treatment from a psychiatrist and therapist. Also marijuana may worsen these disorders as will her alcohol abuse. Alcohol abuse likely also contributing to her memory complaints and confusion,  advised not  to stop abruptly and even decreasing it significantly could cause withdrawal which can be deadly, she needs to see pcp for management, support groups, possibly inpatient detox.  I had a long d/w patient about her stroke, risk for recurrent stroke/TIAs, personally independently reviewed imaging studies and stroke evaluation results and answered questions. Start ASA 325mg  for secondary stroke prevention and maintain strict control of hypertension with blood pressure goal below 130/90, diabetes with hemoglobin A1c goal below 6.5% and lipids with LDL cholesterol goal below 70 mg/dL.. I also advised the patient to eat a healthy diet with plenty of whole grains, cereals, fruits and vegetables, exercise regularly and maintain ideal body weight .  Orders Placed This Encounter  Procedures  . Ambulatory referral to ENT     Sarina Ill, MD  [facility] [address] [place], [place] [address]  Phone [phone] Fax [phone]  A total of 25 minutes was spent face-to-face with this patient. Over half this time was spent on counseling patient  1. Small vessel disease, cerebrovascular   2. Chronic nasal congestion   3. OSA (obstructive sleep apnea)   4. Sleep disorder   5. Lacunar stroke ([place])     diagnosis and different diagnostic and therapeutic options available.

## 2017-09-17 NOTE — Progress Notes (Signed)
[facility]  PATIENT: Toni Hicks DOB: March 13, 1950   REASON FOR VISIT: Follow-up for obstructive sleep apnea with initial CPAP , new complaint of insomnia and nocturia which are preventing her from getting full effect from CPAP [place] 9/11/2019CM Toni Hicks, 67 year old female returns for follow-up for initial CPAP.  She claims she is having no problems with her CPAP machine issues are insomnia which is been going on greater than 20 years and nocturia every couple of hours at night.  Many times she cannot go back to sleep for several hours.  CPAP data dated 07/19/2017-2 09/16/2017 shows greater than 4 hours at 40%.  Usage days 45 out of 60.  Less than 4 hours at 35%.  Average usage 4 hours 15 minutes.  Set pressure 9 cm AHI 4.1 ESS 6.  Data for  08/18/2017 through 09/16/2017 greater than 4 hours at 40%.  She returns for reevaluation. 3/4/19CDJeanne L Hicks is a 67 y.o. female , seen here as in a referral after she suffered a stroke, and has seen Dr Toni Hicks and later Dr. Jaynee Hicks, who iniated this referral.  Toni Hicks had suffered a stroke but not within the last 6 months.  She has originally seen Dr. Annamaria Hicks, and then most recently Dr. Sarina Hicks.  She carries a diagnosis of allergic rhinitis, asthma, history of alcohol abuse, chronic pneumonia or recurrent pneumonias.  Impaired fasting blood sugar, mycoplasma pneumonia, restless legs and hypothyroidism.   The patient is currently using albuterol as needed, amlodipine, Advair, baby aspirin daily, Myrbetriq at night,  and has been ordered an AeroChamber plus inhaler ( not received yet ) .  Chief complaint according to patient : "I cannot get enough sleep- anxiety "  Sleep habits are as follows: The patient usually watches television or is reading in her living room before she retreats to her bedroom.  Her significant other and her family have known that she falls asleep while watching  TV, and snoring.  The patient presented with excessive daytime sleepiness reflected and an Epworth sleepiness score of 18 points. There was a report of a dry mouth in the morning witnessed lo ud snoring and witnessed apneas by his spouse. The patient's regular sleep routine is as follows; the patient normally goes to bed around 11 and falls asleep within 10-15 minutes he is awoken 3 times at night and nocturia and is woken by his alarm clock at 6 in the morning. 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   She is " reading" on an electronic device- and does play on face book, on her cell phone.  She retreats to bed at 10.30- 11.30 Pm, following her boyfriend by an hour.  They don't sleep in the same room.  They hear one another snoring. She sleeps for 2 hours through and wakes up for nocturia, and sometimes by her own snoring. She gets up at 7. 30 and has breakfast, returns to bed - tries again and again to get extra sleep. She has 3-5 nocturias.  She naps every day. She sleeps in increments amounting to 8-12 hours per 24 hours. There is no structure. She is entangled in her sheets, restlessly sleeping. She kicks a lot.   REVIEW OF SYSTEMS: Full 14 system review of systems performed and notable only for those listed, all others are neg:  Constitutional: neg  Cardiovascular: neg Ear/Nose/Throat: neg  Skin: neg Eyes: neg Respiratory: neg  Gastroitestinal: neg  Genitourinary nocturia Hematology/Lymphatic: neg  Endocrine: neg Musculoskeletal:neg Allergy/Immunology: neg Neurological: neg Psychiatric: neg Sleep : Obstructive sleep apnea with initial CPAP, insomnia for greater than 20 years   ALLERGIES: Allergies  Allergen Reactions  . Other     Can not eat carbohydrates without protein, is allergic to certain foods but can take in certain doses    HOME MEDICATIONS: Outpatient Medications Prior to Visit  Medication Sig Dispense Refill  . albuterol (PROVENTIL HFA;VENTOLIN  HFA) 108 (90 Base) MCG/ACT inhaler INHALE 2 PUFFS EVERY 6 HOURS AS NEEDED FOR WHEEZING OR SHORTNESS OF BREATH 18 Inhaler 3  . ALPRAZolam (XANAX) 0.5 MG tablet PLEASE SEE ATTACHED FOR DETAILED DIRECTIONS 30 tablet 0  . amLODipine (NORVASC) 5 MG tablet TAKE 1 TABLET (5 MG TOTAL) DAILY BY MOUTH. 90 tablet 1  . aspirin EC 81 MG tablet Take 81 mg by mouth daily.    [name] Kitchen b complex vitamins tablet Take 1 tablet by mouth as needed.      . fluocinonide cream (LIDEX) 0.05 % APPLY TOPICALLY 3 (THREE) TIMES DAILY AS NEEDED (SKIN IRRITATION). 120 g 0  . Spacer/Aero-Holding Chambers (AEROCHAMBER PLUS) inhaler Use as instructed 1 each 2  . tolterodine (DETROL LA) 4 MG 24 hr capsule Take 4 mg by mouth daily.    . traZODone (DESYREL) 50 MG tablet Take 50 mg by mouth at bedtime.    Grant Ruts INHUB 250-50 MCG/DOSE AEPB INHALE 1 PUFF BY MOUTH EVERY 12 HOURS 60 each 1   No facility-administered medications prior to visit.     PAST MEDICAL HISTORY: Past Medical History:  Diagnosis Date  . ADD (attention deficit disorder)   . Adult acne   . Alcohol abuse   . Allergic rhinitis   . Anxiety and depression   . Asthma   . Congenital pneumonia   . Hypoglycemia    no longer per pt   . Hypothyroidism   . Impaired fasting blood sugar   . Mycoplasma pneumonia   . Restless leg syndrome   . Seasonal allergies   . Stroke [facility]     PAST SURGICAL HISTORY: Past Surgical History:  Procedure Laterality Date  . DILATION AND CURETTAGE OF UTERUS     @ 67 years old  . WISDOM TOOTH EXTRACTION      FAMILY HISTORY: Family History  Problem Relation Age of Onset  . COPD Father   . Heart failure Father   . Coronary artery disease Father   . Cancer Mother        breast  . Hypertension Mother   . Dementia Mother   . Stroke Mother   . Breast cancer Mother     SOCIAL HISTORY: Social History   Socioeconomic History  . Marital status: Widowed    Spouse name: has boyfriend Sam  . Number of children: 2  . Years of  education: 42  . Highest education level: Some college, no degree  Occupational History  . Occupation: Clinical biochemist    Comment: Semi-retired  Social Needs  . Financial resource strain: Not on file  . Food insecurity:    Worry: Not on file    Inability: Not on file  . Transportation needs:    Medical: Not on file    Non-medical: Not on file  Tobacco Use  . Smoking status: Former Smoker    Types: Cigarettes    Last attempt to quit: 1980    Years since quitting: 39.7  . Smokeless tobacco: Never Used  Substance and Sexual Activity  . Alcohol use: Yes    Alcohol/week: 0.0 standard drinks    Comment: 3-5 glasses of wine day   . Drug use: No  . Sexual activity: Not on file  Lifestyle  . Physical activity:    Days per week: Not on file    Minutes per session: Not on file  . Stress: Not on file  Relationships  . Social connections:    Talks on phone: Not on file    Gets together: Not on file    Attends religious service: Not on file    Active member of club or organization: Not on file    Attends meetings of clubs or organizations: Not on file    Relationship status: Not on file  . Intimate partner violence:    Fear of current or ex partner: Not on file    Emotionally abused: Not on file    Physically abused: Not on file    Forced sexual activity: Not on file  Other Topics Concern  . Not on file  Social History Narrative   [employer], [employer] MA- photography   Married '73- 10 years divorced, married '86- 34 years divorced, Married '96- 46 years- widowed.   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   Caffeine occasional     PHYSICAL EXAM  Vitals:   09/18/17 1010  BP: 140/75  Pulse: 87  Weight: 169 lb (76.7 kg)  Height: 5' 1.5" (1.562 m)   Body mass index is 31.42 kg/m.  Generalized: Well developed, in no acute distress  Head: normocephalic and  atraumatic,. Oropharynx benign mallopatti 4-5 Neck: Supple, circumference 15.5 Lungsclear  Musculoskeletal: No deformity  Skin no edema or rash Neurological examination   Mentation: Alert oriented to time, place, history taking. Attention span and concentration appropriate. Recent and remote memory intact.  Follows all commands speech and language fluent.   Cranial nerve II-XII: Pupils were equal round reactive to light extraocular movements were full, visual field were full on confrontational test. Facial sensation and strength were normal. hearing was intact to finger rubbing bilaterally. Uvula tongue midline. head turning and shoulder shrug were normal and symmetric.Tongue protrusion into cheek strength was normal. Motor: normal bulk and tone, full strength in the BUE, BLE,  Sensory: normal and symmetric to light Hicks,  Coordination: finger-nose-finger, heel-to-shin bilaterally, no dysmetria Gait and Station: Rising up from seated position without assistance, normal stance, no difficulty with turns no assistive device DIAGNOSTIC DATA (LABS, IMAGING, TESTING) - I reviewed patient records, labs, notes, testing and imaging myself where available.  Lab Results  Component Value Date   WBC 7.5 12/16/2015   HGB 17.6 (H) 12/16/2015   HCT 51.2 (H) 12/16/2015   MCV 90.3 12/16/2015   PLT 271.0 12/16/2015      Component Value Date/Time   NA 138 02/27/2017 1604   K 4.0 02/27/2017 1604   CL 97 02/27/2017 1604   CO2 25 02/27/2017 1604   GLUCOSE 139 (H) 02/27/2017 1604   GLUCOSE 122 (H) 12/16/2015 1238   BUN 10 02/27/2017 1604   CREATININE 0.60 02/27/2017 1604   CALCIUM 10.0 02/27/2017 1604   PROT 7.9 12/16/2015 1238   ALBUMIN 4.8 12/16/2015 1238   AST 29 12/16/2015 1238   ALT 23 12/16/2015 1238   ALKPHOS 86 12/16/2015 1238   BILITOT 1.0 12/16/2015 1238   GFRNONAA 96 02/27/2017 1604  GFRAA 111 02/27/2017 1604   Lab Results  Component Value Date   CHOL 174 12/16/2015   HDL 73.40  12/16/2015   LDLCALC 80 12/16/2015   TRIG 105.0 12/16/2015   CHOLHDL 2 12/16/2015   Lab Results  Component Value Date   HGBA1C 5.8 12/16/2015   Lab Results  Component Value Date   VITAMINB12 517 12/16/2015   Lab Results  Component Value Date   TSH 1.03 12/07/2010      ASSESSMENT AND PLAN  67 y.o. year old female  has a past medical history of ADD (attention deficit disorder), Adult acne, Alcohol abuse, Allergic rhinitis, Anxiety and depression, Asthma, Congenital pneumonia, Hypoglycemia, Hypothyroidism, Impaired fasting blood sugar, Mycoplasma pneumonia, Restless leg syndrome, Seasonal allergies, and Stroke ([place]). here to follow-up for initial CPAP compliance and new complaints of chronic insomnia and nocturia  CPAP compliance 40% greater than 4 hours needs to be greater than 70 Try trazodone for insomnia Try Detrol for urinary frequency No change in settings Follow-up in 4 months requests to be seen by Dr. Brett Fairy I spent 25 minutes in total face to face time with the patient more than 50% of which was spent counseling and coordination of care, reviewing test results reviewing medications and discussing and reviewing the diagnosis of obstructive sleep apnea her compliance report and new medications for nocturia and insomnia.  Written information given as well Dennie Bible, [facility], [facility], APRN  [facility] [address], [place], [place] [address] [phone]

## 2017-09-17 NOTE — Patient Instructions (Addendum)
Dr. Ernesto Rutherford for chronic congestion Dr. Brett Fairy

## 2017-09-18 ENCOUNTER — Encounter: Payer: Self-pay | Admitting: Neurology

## 2017-09-18 ENCOUNTER — Ambulatory Visit (INDEPENDENT_AMBULATORY_CARE_PROVIDER_SITE_OTHER): Payer: Medicare Other | Admitting: Nurse Practitioner

## 2017-09-18 ENCOUNTER — Encounter: Payer: Self-pay | Admitting: Nurse Practitioner

## 2017-09-18 VITALS — BP 140/75 | HR 87 | Ht 61.5 in | Wt 169.0 lb

## 2017-09-18 DIAGNOSIS — G4700 Insomnia, unspecified: Secondary | ICD-10-CM

## 2017-09-18 DIAGNOSIS — G4733 Obstructive sleep apnea (adult) (pediatric): Secondary | ICD-10-CM

## 2017-09-18 DIAGNOSIS — Z9989 Dependence on other enabling machines and devices: Secondary | ICD-10-CM | POA: Diagnosis not present

## 2017-09-18 DIAGNOSIS — I6381 Other cerebral infarction due to occlusion or stenosis of small artery: Secondary | ICD-10-CM | POA: Diagnosis not present

## 2017-09-18 DIAGNOSIS — R351 Nocturia: Secondary | ICD-10-CM | POA: Diagnosis not present

## 2017-09-18 MED ORDER — TOLTERODINE TARTRATE ER 4 MG PO CP24: 4.0000 mg | ORAL_CAPSULE | Freq: Every day | ORAL | 6 refills | Status: DC

## 2017-09-18 MED ORDER — TRAZODONE HCL 50 MG PO TABS: 50.0000 mg | ORAL_TABLET | Freq: Every day | ORAL | 6 refills | Status: DC

## 2017-09-18 NOTE — Patient Instructions (Addendum)
CPAP compliance 40% greater than 4 hours needs to be greater than 70 Try trazodone for insomnia Try Detrol for urinary frequency No change in settings Follow-up in 4 months Tolterodine tablets What is this medicine? TOLTERODINE (tole TER a deen) is used to treat overactive bladder. This medicine reduces the amount of bathroom visits. It may also help to control wetting accidents. This medicine may be used for other purposes; ask your health care provider or pharmacist if you have questions. COMMON BRAND NAME(S): Detrol What should I tell my health care provider before I take this medicine? They need to know if you have any of these conditions: -difficulty passing urine -glaucoma -intestinal obstruction -irregular heartbeat or you have a family member with irregular heartbeat -kidney disease -liver disease -myasthenia gravis -an unusual or allergic reaction to tolterodine, fesoterodine, other medicines, foods, dyes, or preservatives -pregnant or trying to get pregnant -breast-feeding How should I use this medicine? Take this medicine by mouth with a glass of water. Follow the directions on the prescription label. Take your doses at regular intervals. Do not take your medicine more often than directed. Talk to your pediatrician regarding the use of this medicine in children. Special care may be needed. Overdosage: If you think you have taken too much of this medicine contact a poison control center or emergency room at once. NOTE: This medicine is only for you. Do not share this medicine with others. What if I miss a dose? If you miss a dose, take it as soon as you can. If it is almost time for your next dose, take only that dose. Do not take double or extra doses. What may interact with this medicine? -clarithromycin -cyclosporine -erythromycin -fluoxetine -medicines for fungal infections, like fluconazole, itraconazole, ketoconazole or voriconazole -vinblastine This list may not  describe all possible interactions. Give your health care provider a list of all the medicines, herbs, non-prescription drugs, or dietary supplements you use. Also tell them if you smoke, drink alcohol, or use illegal drugs. Some items may interact with your medicine. What should I watch for while using this medicine? It may take 2 or 3 months to notice the full benefit from this medicine. You may need to limit your intake tea, coffee, caffeinated sodas, and alcohol. These drinks may make your symptoms worse. You may get drowsy or dizzy. Do not drive, use machinery, or do anything that needs mental alertness until you know how this drug affects you. Do not stand or sit up quickly, especially if you are an older patient. This reduces the risk of dizzy or fainting spells. Your mouth may get dry. Chewing sugarless gum or sucking hard candy, and drinking plenty of water may help. Contact your doctor if the problem does not go away or is severe. This medicine may cause dry eyes and blurred vision. If you wear contact lenses you may feel some discomfort. Lubricating drops may help. See your eye doctor if the problem does not go away or is severe. Avoid extreme heat. This medicine can cause you to sweat less than normal. Your body temperature could increase to dangerous levels, which may lead to heat stroke. What side effects may I notice from receiving this medicine? Side effects that you should report to your doctor or health care professional as soon as possible: -allergic reactions like skin rash, itching or hives, swelling of the face, lips, or tongue -breathing problems -confusion -difficulty passing urine -fast, irregular heartbeat -hallucinations -swelling in feet, hands Side effects that usually  do not require medical attention (report to your doctor or health care professional if they continue or are bothersome): -changes in vision -constipation -dry eyes, mouth -headache -dizziness,  drowsiness -stomach upset This list may not describe all possible side effects. Call your doctor for medical advice about side effects. You may report side effects to FDA at 1-800-FDA-1088. Where should I keep my medicine? Keep out of the reach of children. Store at room temperature between 15 and 30 degrees C (59 and 86 degrees F). Throw away any unused medicine after the expiration date. NOTE: This sheet is a summary. It may not cover all possible information. If you have questions about this medicine, talk to your doctor, pharmacist, or health care provider.  2018 Elsevier/Gold Standard (2009-10-04 17:19:08) Trazodone tablets What is this medicine? TRAZODONE (TRAZ oh done) is used to treat depression. This medicine may be used for other purposes; ask your health care provider or pharmacist if you have questions. COMMON BRAND NAME(S): Desyrel What should I tell my health care provider before I take this medicine? They need to know if you have any of these conditions: -attempted suicide or thinking about it -bipolar disorder -bleeding problems -glaucoma -heart disease, or previous heart attack -irregular heart beat -kidney or liver disease -low levels of sodium in the blood -an unusual or allergic reaction to trazodone, other medicines, foods, dyes or preservatives -pregnant or trying to get pregnant -breast-feeding How should I use this medicine? Take this medicine by mouth with a glass of water. Follow the directions on the prescription label. Take this medicine shortly after a meal or a light snack. Take your medicine at regular intervals. Do not take your medicine more often than directed. Do not stop taking this medicine suddenly except upon the advice of your doctor. Stopping this medicine too quickly may cause serious side effects or your condition may worsen. A special MedGuide will be given to you by the pharmacist with each prescription and refill. Be sure to read this  information carefully each time. Talk to your pediatrician regarding the use of this medicine in children. Special care may be needed. Overdosage: If you think you have taken too much of this medicine contact a poison control center or emergency room at once. NOTE: This medicine is only for you. Do not share this medicine with others. What if I miss a dose? If you miss a dose, take it as soon as you can. If it is almost time for your next dose, take only that dose. Do not take double or extra doses. What may interact with this medicine? Do not take this medicine with any of the following medications: -certain medicines for fungal infections like fluconazole, itraconazole, ketoconazole, posaconazole, voriconazole -cisapride -dofetilide -dronedarone -linezolid -MAOIs like Carbex, Eldepryl, Marplan, Nardil, and Parnate -mesoridazine -methylene blue (injected into a vein) -pimozide -saquinavir -thioridazine -ziprasidone This medicine may also interact with the following medications: -alcohol -antiviral medicines for HIV or AIDS -aspirin and aspirin-like medicines -barbiturates like phenobarbital -certain medicines for blood pressure, heart disease, irregular heart beat -certain medicines for depression, anxiety, or psychotic disturbances -certain medicines for migraine headache like almotriptan, eletriptan, frovatriptan, naratriptan, rizatriptan, sumatriptan, zolmitriptan -certain medicines for seizures like carbamazepine and phenytoin -certain medicines for sleep -certain medicines that treat or prevent blood clots like dalteparin, enoxaparin, warfarin -digoxin -fentanyl -lithium -NSAIDS, medicines for pain and inflammation, like ibuprofen or naproxen -other medicines that prolong the QT interval (cause an abnormal heart rhythm) -rasagiline -supplements like St. John's wort, kava  kava, valerian -tramadol -tryptophan This list may not describe all possible interactions. Give your  health care provider a list of all the medicines, herbs, non-prescription drugs, or dietary supplements you use. Also tell them if you smoke, drink alcohol, or use illegal drugs. Some items may interact with your medicine. What should I watch for while using this medicine? Tell your doctor if your symptoms do not get better or if they get worse. Visit your doctor or health care professional for regular checks on your progress. Because it may take several weeks to see the full effects of this medicine, it is important to continue your treatment as prescribed by your doctor. Patients and their families should watch out for new or worsening thoughts of suicide or depression. Also watch out for sudden changes in feelings such as feeling anxious, agitated, panicky, irritable, hostile, aggressive, impulsive, severely restless, overly excited and hyperactive, or not being able to sleep. If this happens, especially at the beginning of treatment or after a change in dose, call your health care professional. Toni Hicks may get drowsy or dizzy. Do not drive, use machinery, or do anything that needs mental alertness until you know how this medicine affects you. Do not stand or sit up quickly, especially if you are an older patient. This reduces the risk of dizzy or fainting spells. Alcohol may interfere with the effect of this medicine. Avoid alcoholic drinks. This medicine may cause dry eyes and blurred vision. If you wear contact lenses you may feel some discomfort. Lubricating drops may help. See your eye doctor if the problem does not go away or is severe. Your mouth may get dry. Chewing sugarless gum, sucking hard candy and drinking plenty of water may help. Contact your doctor if the problem does not go away or is severe. What side effects may I notice from receiving this medicine? Side effects that you should report to your doctor or health care professional as soon as possible: -allergic reactions like skin rash,  itching or hives, swelling of the face, lips, or tongue -elevated mood, decreased need for sleep, racing thoughts, impulsive behavior -confusion -fast, irregular heartbeat -feeling faint or lightheaded, falls -feeling agitated, angry, or irritable -loss of balance or coordination -painful or prolonged erections -restlessness, pacing, inability to keep still -suicidal thoughts or other mood changes -tremors -trouble sleeping -seizures -unusual bleeding or bruising Side effects that usually do not require medical attention (report to your doctor or health care professional if they continue or are bothersome): -change in sex drive or performance -change in appetite or weight -constipation -headache -muscle aches or pains -nausea This list may not describe all possible side effects. Call your doctor for medical advice about side effects. You may report side effects to FDA at 1-800-FDA-1088. Where should I keep my medicine? Keep out of the reach of children. Store at room temperature between 15 and 30 degrees C (59 to 86 degrees F). Protect from light. Keep container tightly closed. Throw away any unused medicine after the expiration date. NOTE: This sheet is a summary. It may not cover all possible information. If you have questions about this medicine, talk to your doctor, pharmacist, or health care provider.  2018 Elsevier/Gold Standard (2015-05-26 16:57:05)

## 2017-09-20 ENCOUNTER — Other Ambulatory Visit: Payer: Self-pay | Admitting: Neurology

## 2017-09-20 NOTE — Telephone Encounter (Signed)
Dear Mrs. Toni Hicks , I treat organic sleep disorders, and Insomnia after treatment of sleep apnea is not one of them.  We have discussed sleep habits and sleep hygiene , and after successful implementation the discussion of insomnia treatment should be held with a behavior health specialist.  [name] is not prescribed by me. Please contact your urologist for advice on nocturia or urge incontinence.   [name], MD

## 2017-09-20 NOTE — Telephone Encounter (Signed)
-----   Message -----  From: Gates Rigg  Sent: 09/18/2017  5:40 PM EDT  To: [facility]  Subject: Visit Follow-Up Question               After careful review of the included paperwork and other reputable reference materials for contraindications, it seems unwise to pursue these medications and I would like to discuss other treatment options.    Detrol:    preexisting conditions: irregular heartbeat, anxiety and asthma (unwilling to risk increase)  family history of dementia and Alzheimer's (linked to higher dementia risk)  drug interactions: caffeine and alcohol (unwilling to lower amounts further)  side effects: dry eyes/blurred vision/contact issues, constipation, heat issues (unwilling to risk increase)    Trazodone:    preexisting conditions: heart disease (stroke), irregular heartbeat, history of depression and suicidal ideation  drug interactions: alcohol, aspirin, NSAIDs, St. John's Wort, Valerian, Benadryl, Xanax   side effects: constipation, muscle pain (unwilling to risk increase)  Black box warning  Rapid development of tolerance, daytime sedation    I would like an appointment to discuss other treatment options.   Detrol is an anticholinergic( just like Benaryl)  - Trazodone is not.  anticholinergica may interfere with memory, EKG, cause dry mouth and eyes, residual grogginess. Caffeine and alcohol both cause urge incontinence, are bladder irritans.  Trazodone's risk for causing muscle aches is very low.

## 2017-09-21 ENCOUNTER — Other Ambulatory Visit: Payer: Self-pay | Admitting: Neurology

## 2017-09-23 ENCOUNTER — Ambulatory Visit (INDEPENDENT_AMBULATORY_CARE_PROVIDER_SITE_OTHER): Payer: Medicare Other | Admitting: Neurology

## 2017-09-23 ENCOUNTER — Encounter: Payer: Self-pay | Admitting: Neurology

## 2017-09-23 VITALS — BP 162/87 | HR 78 | Ht 61.5 in | Wt 171.0 lb

## 2017-09-23 DIAGNOSIS — Z9989 Dependence on other enabling machines and devices: Secondary | ICD-10-CM | POA: Diagnosis not present

## 2017-09-23 DIAGNOSIS — F5104 Psychophysiologic insomnia: Secondary | ICD-10-CM

## 2017-09-23 DIAGNOSIS — R0683 Snoring: Secondary | ICD-10-CM | POA: Diagnosis not present

## 2017-09-23 DIAGNOSIS — R0981 Nasal congestion: Secondary | ICD-10-CM

## 2017-09-23 DIAGNOSIS — R351 Nocturia: Secondary | ICD-10-CM | POA: Diagnosis not present

## 2017-09-23 DIAGNOSIS — I6381 Other cerebral infarction due to occlusion or stenosis of small artery: Secondary | ICD-10-CM

## 2017-09-23 DIAGNOSIS — I639 Cerebral infarction, unspecified: Secondary | ICD-10-CM

## 2017-09-23 DIAGNOSIS — G4733 Obstructive sleep apnea (adult) (pediatric): Secondary | ICD-10-CM | POA: Diagnosis not present

## 2017-09-23 DIAGNOSIS — F1910 Other psychoactive substance abuse, uncomplicated: Secondary | ICD-10-CM

## 2017-09-23 NOTE — Progress Notes (Signed)
SLEEP MEDICINE CLINIC   Provider:  Larey Seat, M D  Primary Care Physician:  Hoyt Koch, MD   Referring Provider:  Sarina Ill, MD and Marcial Pacas, MD at [facility]      HPI:  Toni Hicks is a 67 y.o. female , seen on 09-23-2017 in a RV.  She was seen here as in a referral after she suffered a stroke, and has seen Dr Krista Blue and later Dr. Jaynee Eagles, who iniated this referral. Toni Hicks had suffered a stroke but not within the last 6 months.  She has originally seen pulmonologist Dr. Annamaria Boots, and then most recently Dr. Sarina Ill.   I had the pleasure of seeing Toni Hicks here today on 16 September in a revisit after she underwent a split-night polysomnography.  This took place on 08 April 2017, referral by Dr. Sarina Ill.  She had very mild REM dependent obstructive sleep apnea with an AHI under 11/h but during REM sleep her apnea accentuated to 63/h of sleep.  Hypoxemia was borderline there were just under 30 minutes of total time and hypoxia measured, but it amounted to almost 20% given her brief sleep time overall.  We had recommended changes in sleep hygiene which were discussed at the last visit with [name], and a follow-up for an attended CPAP titration whole night.  Also discussed with the avoidance of caffeine-containing beverages, alcohol and nicotine, and sleep psychology-cognitive behavioral referral.  The CPAP titration was then repeated on 14 May 2017 and begun at 5 cm water pressure which was step-by-step increased to 9 cm water pressure.  I met 9 cm CPAP there was no longer any apnea noted, she used a DreamWear nasal pillow and small size.  There was no REM sleep rebounding at 21.6% of the total sleep architecture, EKG was a normal sinus rhythm, there were some periodic limb movements and some of them woke the patient.  This time at 7.6/h.  I ordered an auto titration capable device.  CPAP compliance was performed 80% on 24 out of 30 days,  but only 11 of these 24 days  over 4 hours.   Average use of time currently is 2 hours 48 minutes which may be partially due to the patient's tendency to have upper airway infections and congestion.  CPAP is an auto titratable device but is set at 9 cmH2O pressure was 1 cm EPR, residual AHI was 4.6 which leads me to believe that the actually need to increase the pressure.  She did have some moderate air leaks, these may have been more related to displacing the mask and actually having trouble with oral Ventolin.  There were no central apneas emerging.  The patient states that she has gained about 1 hour of nocturnal sleep by using CPAP, but she has not had the desired effect on nocturia.  For this reason nurse practitioner [name] had prescribed her Detrol after Myrbetriq had no effect and was very expensive.   She has not tried the [employer] yet, she also has not tried the trazodone yet which is a non-addictive medication that can help initiating and maintaining sleep.   The patient states that she had severe psychiatric side effects just from using Nasacort ( Nasonex in the past) "once a week " and became actually suicidal.  Given this I would think that she is exquisitely sensitive to steroids, prednisone while she can tolerate marihuana and alcohol. She would like to have an ENT opnion on her sinusitis.  She is using a fit bit - and that documented about 6 hours of sleep.    She carries a diagnosis of allergic rhinitis, asthma, history of alcohol abuse, chronic pneumonia or recurrent pneumonias.  Impaired fasting blood sugar, mycoplasma pneumonia, restless legs and hypothyroidism.   The patient is currently using albuterol as needed, amlodipine, Advair, baby aspirin daily, Myrbetriq at night,  and has been ordered an AeroChamber plus inhaler ( not received yet ) . She is drinking alcohol daily and smokes marihuana, and is clearly not willing to change that, but is frustrated about the results of CPAP therapy and gets 6 hours of sleep-   and requests Xanax.  She is here because in her last visit with [name] she was prescribed Detrol, and Trazodone- and she looked up all side effects and was told by friends not to even consider taking the mediations. She has not used it, not tried it, she asked to discuss this with me, not the NP.          Consult: Chief complaint according to patient : "I cannot get enough sleep- anxiety " Sleep habits are as follows: The patient usually watches television or is reading in her living room before she retreats to her bedroom.  Her significant other and her family have known that she falls asleep while watching TV, and snoring. The patient presented with excessive daytime sleepiness reflected and an Epworth sleepiness score of 18 points. There was a report of a dry mouth in the morning witnessed l ud snoring and witnessed apneas per spouse. The patient's regular sleep routine is as follows; the patient normally goes to bed around 11 and falls asleep within 10-15 minutes he is awoken 3 times at night and nocturia and is woken by his alarm clock at 6 in the morning. 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   She is " reading" on an electronic device- and does play on face book, on her cell phone.  She retreats to bed at 10.30- 11.30 Pm, following her boyfriend by an hour.  They don't sleep in the same room.  They hear one another snoring. She sleeps for 2 hours through and wakes up for nocturia, and sometimes by her own snoring. She gets up at 7. 30 and has breakfast, returns to bed - tries again and again to get extra sleep. She has 3-5 nocturias.  She naps every day. She sleeps in increments amounting to 8-12 hours per 24 hours. There is no structure. She is entangled in her sheets, restlessly sleeping. She kicks a lot.    Sleep medical history and family sleep history: deminished lung capacity- almost yearly pneumonia, frequent bronchitis, COPD ?, apnea and snoring, RLS, audible  breathing, frequent breathing.  Toni Hicks is a 67 y.o. female here as a referral from Dr. Sharlet Salina , and was seen by Sarina Ill. MD at [facility] on [phone] "for review of MRI of the brain". Patient here today with daughter who provides much information. She is noncompliant with medication and has stopped taking her aspirin and not taking recommended meds such as statin for HDL. Smoked for 20 years, diabetes under control now but was not at one time, smokes marijuana, long history of alcohol use now maybe a bottle a day. She has moments of complete confusion. She has memory loss.Short term memory is worsening. She has untreated depression and anxiety. She snores and wakes herself up snoring, she struggles to go  back to sleep, she snorts, breathing irregularities when sleeping. She has irregular breathing, "snorting, stopping, escalating noise" Spent extended time with patient and daughter discussing her moderately advanced microvascular ischemic changes in the brain which puts her at risk for increased strokes, her previous stroke due to small-vessel disease, her non-complaince with medication is increasing her risk for another stroke and multiple other health problems"  Quoted from Dr Jaynee Eagles.    Social history:  Worked as a Chief Operating Officer, delivered newspapers, irregular hours late night , early mornings. Her daughter is here with her. Lives with her boyfriend.  Used alcohol to excess, marihuana. Smoker with COPD not asthma.     Review of Systems: Out of a complete 14 system review, the patient complains of only the following symptoms, and all other reviewed systems are negative.  sinusitis,  Wheezing, insomnia. RLS  Epworth score 8 with naps ! , Fatigue severity score 42   , depression score - she has not endorsed any.    Social History   Socioeconomic History  . Marital status: Widowed    Spouse name: has boyfriend Sam  . Number of children: 2  . Years of education: 38  . Highest education  level: Some college, no degree  Occupational History  . Occupation: Clinical biochemist    Comment: Semi-retired  Social Needs  . Financial resource strain: Not on file  . Food insecurity:    Worry: Not on file    Inability: Not on file  . Transportation needs:    Medical: Not on file    Non-medical: Not on file  Tobacco Use  . Smoking status: Former Smoker    Types: Cigarettes    Last attempt to quit: 1980    Years since quitting: 39.7  . Smokeless tobacco: Never Used  Substance and Sexual Activity  . Alcohol use: Yes    Alcohol/week: 0.0 standard drinks    Comment: 3-5 glasses of wine day   . Drug use: No  . Sexual activity: Not on file  Lifestyle  . Physical activity:    Days per week: Not on file    Minutes per session: Not on file  . Stress: Not on file  Relationships  . Social connections:    Talks on phone: Not on file    Gets together: Not on file    Attends religious service: Not on file    Active member of club or organization: Not on file    Attends meetings of clubs or organizations: Not on file    Relationship status: Not on file  . Intimate partner violence:    Fear of current or ex partner: Not on file    Emotionally abused: Not on file    Physically abused: Not on file    Forced sexual activity: Not on file  Other Topics Concern  . Not on file  Social History Narrative   [employer], [employer] MA- photography   Married '73- 10 years divorced, married '86- 27 years divorced, Married '96- 37 years- widowed.   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   Caffeine occasional    Family History  Problem Relation Age of Onset  . COPD Father   . Heart failure Father   . Coronary artery disease Father   . Cancer Mother        breast  . Hypertension Mother   . Dementia Mother   .  Stroke Mother   . Breast cancer Mother     Past Medical History:    Diagnosis Date  . ADD (attention deficit disorder)   . Adult acne   . Alcohol abuse   . Allergic rhinitis   . Anxiety and depression   . Asthma   . Congenital pneumonia   . Hypoglycemia    no longer per pt   . Hypothyroidism   . Impaired fasting blood sugar   . Mycoplasma pneumonia   . Restless leg syndrome   . Seasonal allergies   . Stroke [facility])     Past Surgical History:  Procedure Laterality Date  . DILATION AND CURETTAGE OF UTERUS     @ 67 years old  . WISDOM TOOTH EXTRACTION      Current Outpatient Medications  Medication Sig Dispense Refill  . albuterol (PROVENTIL HFA;VENTOLIN HFA) 108 (90 Base) MCG/ACT inhaler INHALE 2 PUFFS EVERY 6 HOURS AS NEEDED FOR WHEEZING OR SHORTNESS OF BREATH 18 Inhaler 3  . amLODipine (NORVASC) 5 MG tablet TAKE 1 TABLET (5 MG TOTAL) DAILY BY MOUTH. 90 tablet 1  . aspirin EC 81 MG tablet Take 81 mg by mouth daily.    [name] Kitchen b complex vitamins tablet Take 1 tablet by mouth as needed.      . fluocinonide cream (LIDEX) 0.05 % APPLY TOPICALLY 3 (THREE) TIMES DAILY AS NEEDED (SKIN IRRITATION). 120 g 0  . Spacer/Aero-Holding Chambers (AEROCHAMBER PLUS) inhaler Use as instructed 1 each 2  . WIXELA INHUB 250-50 MCG/DOSE AEPB INHALE 1 PUFF BY MOUTH EVERY 12 HOURS 60 each 1  . tolterodine (DETROL LA) 4 MG 24 hr capsule Take 1 capsule (4 mg total) by mouth daily. (Patient not taking: Reported on 09/23/2017) 30 capsule 6  . traZODone (DESYREL) 50 MG tablet Take 1 tablet (50 mg total) by mouth at bedtime. (Patient not taking: Reported on 09/23/2017) 30 tablet 6   No current facility-administered medications for this visit.     Allergies as of 09/23/2017 - Review Complete 09/23/2017  Allergen Reaction Noted  . Other  03/05/2014    Vitals: BP (!) 162/87   Pulse 78   Ht 5' 1.5" (1.562 m)   Wt 171 lb (77.6 kg)   BMI 31.79 kg/m  Last Weight:  Wt Readings from Last 1 Encounters:  09/23/17 171 lb (77.6 kg)   LGX:QJJH mass index is 31.79 kg/m.     Last  Height:   Ht Readings from Last 1 Encounters:  09/23/17 5' 1.5" (1.562 m)    Physical exam:  General: The patient is awake, alert and appears not in acute distress. The patient is well groomed. Head: Normocephalic, atraumatic. Neck is supple. Mallampati 4-5 ,  neck circumference:15.5 . Nasal airflow congested- all year round . Retrognathia is not seen.  Cardiovascular:  Regular rate and rhythm , without  murmurs or carotid bruit, and without distended neck veins. Respiratory: Lungs are wheezing . Skin:  Without evidence of edema, or rash Trunk: BMI is 32 . The patient's posture is erect .   Neurologic exam : The patient is awake and alert, oriented to place and time.   MOCA:No flowsheet data found. MMSE: MMSE - Mini Mental State Exam 05/14/2014  Orientation to time 5  Orientation to Place 5  Registration 3  Attention/ Calculation 5  Recall 3  Language- name 2 objects 2  Language- repeat 1  Language- follow 3 step command 3  Language- read & follow direction 1  Write a sentence  1  Copy design 1  Total score 30       Attention span & concentration ability appears normal.  Speech is fluent, without dysarthria, but dysphonia .  Mood and affect are appropriate.  Cranial nerves: Pupils are unequal in size- left larger 5 mm versus right 4 mm-  uses one contact in her right. both briskly reactive to light.  Funduscopic exam without evidence of pallor or edema. Extraocular movements  in vertical and horizontal planes intact and without nystagmus. Visual fields by finger perimetry are intact. Hearing to finger rub intact. Facial sensation intact to fine touch.  Facial motor strength is symmetric and tongue moves midline. Shoulder shrug was symmetrical.  Motor exam:   Normal tone, muscle bulk and symmetric strength in all extremities. Sensory:  Fine touch, pinprick and vibration were tested in all extremities. Proprioception tested in the upper extremities was normal. Coordination:  Rapid alternating movements in the fingers/hands was normal. Finger-to-nose maneuver  normal without evidence of ataxia, dysmetria or tremor. Gait and station: Patient walks without assistive device. She reports hip pain, left  Limp.  Deep tendon reflexes: in the  upper and lower extremities are symmetric and intact.    Assessment:  After physical and neurologic examination, review of laboratory studies,  Personal review of imaging studies, reports of other /same Imaging studies, results of polysomnography and / or neurophysiology testing and pre-existing records as far as provided in visit., my assessment is    I spent over 15 minutes in 1: 1 conference to get Mrs Lupi buy-in for a 14 day sleep boot camp.   1) insomnia. OSA treatment has improved this- gained 1 hour of sleep.  There is a lot of room for improvement especially establishing a routine time to rise in the morning, Mrs. Spagnolo also was asked to eliminate the electronics from the bedroom and to switch them of 30 minutes before she starts her prep time for sleep. She agreed on a bedtime between 11:30 and midnight and to rise at 7:30 in the morning. She has been using a fit bit and continues to look at the time - and times her nocturia this way- I explained this was the same as looking at the alarm clock or a electronics.    2) OSA on CPAP - poor compliance - was affected by sinusitis.  My concern is that she may have an overlap syndrome between obstructive sleep apnea and hypoxemia due to COPD.  She has never been officially diagnosed with COPD, but she does have the risk factors and the limited chest wall movement was in end expiration that would speak for a diminished lung capacity.  3) Nocturia, she has not responded to Myrbetriq and has not tried [employer]. May have to do with fluid intake, caffeine use has been limited .   4) Substance user. Ongoing alcohol use, 0.35 liters a day of wine, cut down from 1.5 liters. Marihuana user. Reports  suicidal ideation after nasal steroids.   She should still have a psychology referral arranged by PCP- Insomnia - anxiety treatment.   The patient was advised of the nature of the diagnosed disorder , the treatment options and the  risks for general health and wellness arising from not treating the condition.   I spent more than 45  minutes of face to face time with the patient.  Greater than 50% of time was spent in counseling and coordination of care. We have discussed the diagnosis and differential and I answered  the patient's questions.    Plan:  Treatment plan and additional workup : ENT for sinusitis ,  Asthma -orthopnea, needs follow up with pulmonology, Insomnia- Psychiatry follow up needed.   I like for her to use CPAP as much as possible , 4 hours at night  Plus nap time.  There is no interest in changing drinking and drug use, and I will not maintain her on XANAX- that was for the sleep study only and to get used to CPAP.  She doesn't want to use Trazodone, not Detrol- prn RV after ENT, Pulmonology and psychology visits.    Larey Seat, MD 0/10/710, 19:75 AM  Certified in Neurology by ABPN Certified in [place] by [facility] [address] [place], [place] [address]

## 2017-09-24 DIAGNOSIS — J322 Chronic ethmoidal sinusitis: Secondary | ICD-10-CM | POA: Diagnosis not present

## 2017-09-24 DIAGNOSIS — J343 Hypertrophy of nasal turbinates: Secondary | ICD-10-CM | POA: Diagnosis not present

## 2017-09-24 DIAGNOSIS — J320 Chronic maxillary sinusitis: Secondary | ICD-10-CM | POA: Diagnosis not present

## 2017-09-24 DIAGNOSIS — H6063 Unspecified chronic otitis externa, bilateral: Secondary | ICD-10-CM | POA: Diagnosis not present

## 2017-09-24 DIAGNOSIS — J342 Deviated nasal septum: Secondary | ICD-10-CM | POA: Diagnosis not present

## 2017-09-24 DIAGNOSIS — J410 Simple chronic bronchitis: Secondary | ICD-10-CM | POA: Diagnosis not present

## 2017-09-24 DIAGNOSIS — J330 Polyp of nasal cavity: Secondary | ICD-10-CM | POA: Diagnosis not present

## 2017-09-24 DIAGNOSIS — J301 Allergic rhinitis due to pollen: Secondary | ICD-10-CM | POA: Diagnosis not present

## 2017-10-01 DIAGNOSIS — J320 Chronic maxillary sinusitis: Secondary | ICD-10-CM | POA: Diagnosis not present

## 2017-10-01 DIAGNOSIS — J322 Chronic ethmoidal sinusitis: Secondary | ICD-10-CM | POA: Diagnosis not present

## 2017-10-01 DIAGNOSIS — J330 Polyp of nasal cavity: Secondary | ICD-10-CM | POA: Diagnosis not present

## 2017-10-10 ENCOUNTER — Other Ambulatory Visit (HOSPITAL_COMMUNITY): Payer: Self-pay | Admitting: Otolaryngology

## 2017-10-10 DIAGNOSIS — J410 Simple chronic bronchitis: Secondary | ICD-10-CM

## 2017-10-10 DIAGNOSIS — R059 Cough, unspecified: Secondary | ICD-10-CM

## 2017-10-10 DIAGNOSIS — J322 Chronic ethmoidal sinusitis: Secondary | ICD-10-CM | POA: Diagnosis not present

## 2017-10-10 DIAGNOSIS — J301 Allergic rhinitis due to pollen: Secondary | ICD-10-CM | POA: Diagnosis not present

## 2017-10-10 DIAGNOSIS — J320 Chronic maxillary sinusitis: Secondary | ICD-10-CM | POA: Diagnosis not present

## 2017-10-10 DIAGNOSIS — J330 Polyp of nasal cavity: Secondary | ICD-10-CM | POA: Diagnosis not present

## 2017-10-10 DIAGNOSIS — J321 Chronic frontal sinusitis: Secondary | ICD-10-CM | POA: Diagnosis not present

## 2017-10-10 DIAGNOSIS — R05 Cough: Secondary | ICD-10-CM

## 2017-10-16 ENCOUNTER — Other Ambulatory Visit: Payer: Self-pay | Admitting: Nurse Practitioner

## 2017-10-16 DIAGNOSIS — D225 Melanocytic nevi of trunk: Secondary | ICD-10-CM | POA: Diagnosis not present

## 2017-10-16 DIAGNOSIS — L820 Inflamed seborrheic keratosis: Secondary | ICD-10-CM | POA: Diagnosis not present

## 2017-10-16 DIAGNOSIS — B078 Other viral warts: Secondary | ICD-10-CM | POA: Diagnosis not present

## 2017-10-16 DIAGNOSIS — L821 Other seborrheic keratosis: Secondary | ICD-10-CM | POA: Diagnosis not present

## 2017-10-18 ENCOUNTER — Ambulatory Visit (HOSPITAL_COMMUNITY)
Admission: RE | Admit: 2017-10-18 | Discharge: 2017-10-18 | Disposition: A | Discharge status: Home / Self Care | Payer: Medicare Other | Source: Ambulatory Visit | Attending: Otolaryngology | Admitting: Otolaryngology

## 2017-10-18 DIAGNOSIS — J410 Simple chronic bronchitis: Secondary | ICD-10-CM

## 2017-10-18 DIAGNOSIS — R05 Cough: Secondary | ICD-10-CM

## 2017-10-18 DIAGNOSIS — R059 Cough, unspecified: Secondary | ICD-10-CM

## 2017-10-18 DIAGNOSIS — J3489 Other specified disorders of nose and nasal sinuses: Secondary | ICD-10-CM | POA: Diagnosis not present

## 2017-10-18 DIAGNOSIS — J320 Chronic maxillary sinusitis: Secondary | ICD-10-CM

## 2017-10-21 ENCOUNTER — Other Ambulatory Visit: Payer: Self-pay | Admitting: Internal Medicine

## 2017-10-24 DIAGNOSIS — J320 Chronic maxillary sinusitis: Secondary | ICD-10-CM | POA: Diagnosis not present

## 2017-10-24 DIAGNOSIS — J342 Deviated nasal septum: Secondary | ICD-10-CM | POA: Diagnosis not present

## 2017-10-24 DIAGNOSIS — J330 Polyp of nasal cavity: Secondary | ICD-10-CM | POA: Diagnosis not present

## 2017-10-24 DIAGNOSIS — J343 Hypertrophy of nasal turbinates: Secondary | ICD-10-CM | POA: Diagnosis not present

## 2017-10-24 DIAGNOSIS — J301 Allergic rhinitis due to pollen: Secondary | ICD-10-CM | POA: Diagnosis not present

## 2017-10-25 ENCOUNTER — Ambulatory Visit (INDEPENDENT_AMBULATORY_CARE_PROVIDER_SITE_OTHER): Payer: Medicare Other | Admitting: Psychology

## 2017-10-25 DIAGNOSIS — F3181 Bipolar II disorder: Secondary | ICD-10-CM | POA: Diagnosis not present

## 2017-10-30 ENCOUNTER — Other Ambulatory Visit: Payer: Self-pay | Admitting: Internal Medicine

## 2017-11-20 ENCOUNTER — Encounter: Payer: Self-pay | Admitting: Internal Medicine

## 2017-11-22 ENCOUNTER — Ambulatory Visit (INDEPENDENT_AMBULATORY_CARE_PROVIDER_SITE_OTHER): Payer: Medicare Other | Admitting: Psychology

## 2017-11-22 ENCOUNTER — Other Ambulatory Visit: Payer: Self-pay | Admitting: Internal Medicine

## 2017-11-22 DIAGNOSIS — F3181 Bipolar II disorder: Secondary | ICD-10-CM | POA: Diagnosis not present

## 2017-11-28 ENCOUNTER — Other Ambulatory Visit: Payer: Self-pay | Admitting: Internal Medicine

## 2017-11-28 ENCOUNTER — Ambulatory Visit (INDEPENDENT_AMBULATORY_CARE_PROVIDER_SITE_OTHER): Payer: Medicare Other | Admitting: Allergy

## 2017-11-28 ENCOUNTER — Encounter: Payer: Self-pay | Admitting: Allergy

## 2017-11-28 VITALS — BP 103/82 | HR 74 | Temp 98.4°F | Resp 20 | Ht 60.0 in | Wt 173.6 lb

## 2017-11-28 DIAGNOSIS — J4540 Moderate persistent asthma, uncomplicated: Secondary | ICD-10-CM | POA: Diagnosis not present

## 2017-11-28 DIAGNOSIS — H1013 Acute atopic conjunctivitis, bilateral: Secondary | ICD-10-CM | POA: Diagnosis not present

## 2017-11-28 DIAGNOSIS — J189 Pneumonia, unspecified organism: Secondary | ICD-10-CM | POA: Diagnosis not present

## 2017-11-28 DIAGNOSIS — J3089 Other allergic rhinitis: Secondary | ICD-10-CM | POA: Diagnosis not present

## 2017-11-28 DIAGNOSIS — I6381 Other cerebral infarction due to occlusion or stenosis of small artery: Secondary | ICD-10-CM | POA: Diagnosis not present

## 2017-11-28 DIAGNOSIS — J988 Other specified respiratory disorders: Secondary | ICD-10-CM

## 2017-11-28 DIAGNOSIS — J339 Nasal polyp, unspecified: Secondary | ICD-10-CM | POA: Diagnosis not present

## 2017-11-28 DIAGNOSIS — J3489 Other specified disorders of nose and nasal sinuses: Secondary | ICD-10-CM

## 2017-11-28 NOTE — Progress Notes (Signed)
New Patient Note  RE: [name] MRN: [id number] DOB: [birth year] Date of Office Visit: 11/28/2017  Referring provider: Thornell Sartorius, MD Primary care provider: Hoyt Koch, MD  Chief Complaint: allergies  History of present illness: Toni Hicks is a 67 y.o. female presenting today for consultation for allergies.  She also has history of asthma and obstructive sleep apnea.    She states she has been having issues with her sleep due to her sinus issues.  She did see Dr. Ernesto Rutherford who referred her to see an allergist.    She states she wakes up every night with nasal congestion or she has large amount of nasal drainage.  She throat clears throughout the day and states she spits up mucus throughout the day.  She does report sinus pressure in her cheeks that makes her eyes water.  She also reports occasional sinus HA.  She uses netipot as needed and states will put tea tree oil and eucalyptus mixture in her saline rinses.     She reports she was recommended to use Rhinocort 1-2 sprays daily.   She has used Flonase and Nasacort in the past with no significant effect.  She also states the nasacort made her feel suicidal.   She was recommended to use triple antibiotic ointment to the nose by Dr. Ernesto Rutherford as well as clotrimazole ear drops and z-pak.  She states the triple antibiotic ointment did not seem to help at all thus she stopped this.    She states she use to take benadryl at night to help her sleep but does not do that anymore.  She states she tries to not take antihistamines due to "side effects".   She has taken sudafed on occasion for sinus congestion.     She states she has had allergy testing done around 2-48 years old.   She states she did do allergy shots for years about 30 years or so ago.   She does have history of asthma and uses Wixela 1 puffs twice a day.  She uses albuterol daily after exertion (activities like running up the stairs).  She reports cough and  shortness of breath primarily.  She denies hospitalization for asthma but states she has an extensive history of PNA.  She states she was "born with PNA" and always has a chronic state of PNA.  She states she has had at least 40 episodes of PNA in lifetime.  She states she is supposed see pulmonologist but does not have an appointment yet.  She does have OSA and uses a CPAP without humidification.  She feels the CPAP does allow her to get about an hour of sleep at night.    Per review of Dr. Berle Mull exam she does have septal perforation with septal deviation and nasal polyposis.  Review of systems: Review of Systems  Constitutional: Positive for malaise/fatigue. Negative for chills and fever.  HENT: Positive for congestion, nosebleeds and sinus pain. Negative for ear discharge, ear pain and sore throat.   Eyes: Negative for pain, discharge and redness.  Respiratory: Positive for cough and shortness of breath.   Cardiovascular: Negative for chest pain.  Gastrointestinal: Negative for abdominal pain, constipation, diarrhea, heartburn, nausea and vomiting.  Musculoskeletal: Negative for joint pain.  Skin: Positive for rash.  Neurological: Negative for headaches.    All other systems negative unless noted above in HPI  Past medical history: Past Medical History:  Diagnosis Date  . ADD (attention deficit disorder)   .  Adult acne   . Alcohol abuse   . Allergic rhinitis   . Anxiety and depression   . Asthma   . Congenital pneumonia   . Eczema   . Hypoglycemia    no longer per pt   . Hypothyroidism   . Impaired fasting blood sugar   . Mycoplasma pneumonia   . Restless leg syndrome   . Seasonal allergies   . Stroke ([place])   . Urticaria     Past surgical history: Past Surgical History:  Procedure Laterality Date  . DILATION AND CURETTAGE OF UTERUS     @ 67 years old  . WISDOM TOOTH EXTRACTION      Family history:  Family History  Problem Relation Age of Onset  . COPD  Father   . Heart failure Father   . Coronary artery disease Father   . Cancer Mother        breast  . Hypertension Mother   . Dementia Mother   . Stroke Mother   . Breast cancer Mother     Social history: Lives in a home that she renovated without carpeting with electric heating and central cooling.  No pets in the home.  No concern for water damage, mildew or roaches in the home.  She is a retired Manufacturing systems engineer.  Smoking history from [phone] of cigarettes 2 packs/day.  Medication List: Allergies as of 11/28/2017      Reactions   Other    Can not eat carbohydrates without protein, is allergic to certain foods but can take in certain doses      Medication List        Accurate as of 11/28/17 12:32 PM. Always use your most recent med list.          AEROCHAMBER PLUS inhaler Use as instructed   albuterol 108 (90 Base) MCG/ACT inhaler Commonly known as:  PROVENTIL HFA;VENTOLIN HFA INHALE 2 PUFFS EVERY 6 HOURS AS NEEDED FOR WHEEZING OR SHORTNESS OF BREATH   amLODipine 5 MG tablet Commonly known as:  NORVASC Take 1 tablet (5 mg total) by mouth daily. Needs annual visit with labs for further refills   aspirin EC 81 MG tablet Take 81 mg by mouth daily.   b complex vitamins tablet Take 1 tablet by mouth as needed.   fluocinonide cream 0.05 % Commonly known as:  LIDEX APPLY TOPICALLY 3 (THREE) TIMES DAILY AS NEEDED (SKIN IRRITATION).   Fluticasone-Salmeterol 250-50 MCG/DOSE Aepb Commonly known as:  ADVAIR Inhale 1 puff into the lungs every 12 (twelve) hours. Need annual appointment for further refills   tolterodine 4 MG 24 hr capsule Commonly known as:  DETROL LA Take 1 capsule (4 mg total) by mouth daily.   traZODone 50 MG tablet Commonly known as:  DESYREL Take 1 tablet (50 mg total) by mouth at bedtime.       Known medication allergies: Allergies  Allergen Reactions  . Other     Can not eat carbohydrates without protein, is  allergic to certain foods but can take in certain doses    Physical examination: Blood pressure 103/82, pulse 74, temperature 98.4 F (36.9 C), temperature source Oral, resp. rate 20, height 5' (1.524 m), weight 173 lb 9.6 oz (78.7 kg), SpO2 97 %.  General: Alert, interactive, in no acute distress. HEENT: PERRLA, TMs pearly gray, turbinates hypertrophied with septal perforation and septal deviation.  area of perforation with crusted with dried blood without discharge, post-pharynx non erythematous. Neck: Supple  without lymphadenopathy. Lungs: Clear to auscultation without wheezing, rhonchi or rales. {no increased work of breathing. CV: Normal S1, S2 without murmurs. Abdomen: Nondistended, nontender. Skin: Warm and dry, without lesions or rashes. Extremities:  No clubbing, cyanosis or edema. Neuro:   Grossly intact.  Diagnositics/Labs:  Spirometry: FEV1: 1.07L 53%, FVC: 1.51L 57%, ratio consistent with restrictive pattern  Assessment and plan:   Chronic rhinosinusitis with nasal polyps and conjunctivitis likely allergic component  - will obtain environmental allergy panel  - continue use of Rhinocort 2 sprays each nostril daily to help with nasal congestion and polyps (she may benefit from BID dosing).  You likely will benefit from [place] for chronic rhinosinusitis with nasal polyps which is an injection medication done every 2 weeks and can be done at home.  Given brochure today to review and will discuss in further detail at follow-up appointment.     - she also may benefit from [place] use given nasal polyposis as well  - start nasal antihistamine, Astelin 1-2 sprays each nostril twice a day for better control of nasal drainage.   - start Singulair 10mg  daily - take at bedtime   Nasal perforation with septal deviation  - continue recommendations from Dr. Ernesto Rutherford  Asthma, mod persisent  - lung function is poor on today's spirometry  - have access to albuterol inhaler 2 puffs  every 4-6 hours as needed for cough/wheeze/shortness of breath/chest tightness.  May use 15-20 minutes prior to activity.   Monitor frequency of use.    - continue Wixela 1 puff twice a day  - start singulair as above.  I did discuss potential rare side effect with pt today that if she felt mood was changing or she felt depressed to stop this medication.    Asthma control goals:   Full participation in all desired activities (may need albuterol before activity)  Albuterol use two time or less a week on average (not counting use with activity)  Cough interfering with sleep two time or less a month  Oral steroids no more than once a year  No hospitalizations  Recurrent respiratory infections  - pt reported many episodes of PNA in lifetime.  It is unclear if these infections are CXR confirmed however given her reported history decision made to perform immunocompetence work-out with CBC w diff, CMP, immunoglobulins and vaccine titers.   Sleep Apnea  - continue CPAP use and recommend pulmonology appointment to manage   Follow-up 6-8 weeks or sooner if needed  Total of 60 minutes, greater than 50% of which was spent in discussion of treatment and management options.   I appreciate the opportunity to take part in [place] care. Please do not hesitate to contact me with questions.  Sincerely,   Prudy Feeler, MD Allergy/Immunology [facility]

## 2017-11-28 NOTE — Patient Instructions (Addendum)
Allergies  - will obtain environmental allergy panel  - continue use of Rhinocort 2 sprays each nostril daily to help with nasal congestion and polyps.  You likely will benefit from [place] for chronic rhinosinusitis with nasal polyps which is an injection medication done every 2 weeks and can be done at home.  Given brochure today to review and will discuss in further detail at follow-up appointment  - start nasal antihistamine, Astelin 1-2 sprays each nostril twice a day for better control of nasal drainage.   - start Singulair 10mg  daily - take at bedtime   Nasal perforation with septal deviation  - continue recommendations from Dr. Ernesto Rutherford  Asthma  - have access to albuterol inhaler 2 puffs every 4-6 hours as needed for cough/wheeze/shortness of breath/chest tightness.  May use 15-20 minutes prior to activity.   Monitor frequency of use.    - continue Wixela 1 puff twice a day  - singulair as above  Asthma control goals:   Full participation in all desired activities (may need albuterol before activity)  Albuterol use two time or less a week on average (not counting use with activity)  Cough interfering with sleep two time or less a month  Oral steroids no more than once a year  No hospitalizations  Sleep Apnea  - continue CPAP use and recommend pulmonology to manage this for you  Follow-up 6-8 weeks or sooner if needed

## 2017-12-02 DIAGNOSIS — J320 Chronic maxillary sinusitis: Secondary | ICD-10-CM | POA: Diagnosis not present

## 2017-12-02 DIAGNOSIS — J301 Allergic rhinitis due to pollen: Secondary | ICD-10-CM | POA: Diagnosis not present

## 2017-12-02 DIAGNOSIS — J322 Chronic ethmoidal sinusitis: Secondary | ICD-10-CM | POA: Diagnosis not present

## 2017-12-02 DIAGNOSIS — J342 Deviated nasal septum: Secondary | ICD-10-CM | POA: Diagnosis not present

## 2017-12-04 LAB — IGG, IGA, IGM
IGG (IMMUNOGLOBIN G), SERUM: 1036 mg/dL (ref 700–1600)
IgA/Immunoglobulin A, Serum: 231 mg/dL (ref 87–352)
IgM (Immunoglobulin M), Srm: 176 mg/dL (ref 26–217)

## 2017-12-04 LAB — STREP PNEUMONIAE 23 SEROTYPES IGG
PNEUMO AB TYPE 14: 0.7 ug/mL — AB (ref >1.3)
PNEUMO AB TYPE 19 (19F): 0.9 ug/mL — AB (ref >1.3)
PNEUMO AB TYPE 2: 0.1 ug/mL — AB (ref >1.3)
Pneumo Ab Type 17 (17F)*: 0.1 ug/mL — ABNORMAL LOW (ref >1.3)
Pneumo Ab Type 20*: 1 ug/mL — ABNORMAL LOW (ref >1.3)
Pneumo Ab Type 22 (22F)*: <0.1 ug/mL — ABNORMAL LOW (ref >1.3)
Pneumo Ab Type 23 (23F)*: <0.1 ug/mL — ABNORMAL LOW (ref >1.3)
Pneumo Ab Type 26 (6B)*: 0.2 ug/mL — ABNORMAL LOW (ref >1.3)
Pneumo Ab Type 3*: 0.4 ug/mL — ABNORMAL LOW (ref >1.3)
Pneumo Ab Type 34 (10A)*: <0.1 ug/mL — ABNORMAL LOW (ref >1.3)
Pneumo Ab Type 4*: <0.1 ug/mL — ABNORMAL LOW (ref >1.3)
Pneumo Ab Type 43 (11A)*: 1.2 ug/mL — ABNORMAL LOW (ref >1.3)
Pneumo Ab Type 57 (19A)*: 1.4 ug/mL (ref >1.3)
Pneumo Ab Type 68 (9V)*: <0.1 ug/mL — ABNORMAL LOW (ref >1.3)
Pneumo Ab Type 70 (33F)*: 0.2 ug/mL — ABNORMAL LOW (ref >1.3)
Pneumo Ab Type 8*: <0.1 ug/mL — ABNORMAL LOW (ref >1.3)
Pneumo Ab Type 9 (9N)*: <0.1 ug/mL — ABNORMAL LOW (ref >1.3)

## 2017-12-04 LAB — COMPREHENSIVE METABOLIC PANEL
A/G RATIO: 2 (ref 1.2–2.2)
ALT: 22 IU/L (ref 0–32)
AST: 28 IU/L (ref 0–40)
Albumin: 4.9 g/dL — ABNORMAL HIGH (ref 3.6–4.8)
Alkaline Phosphatase: 99 IU/L (ref 39–117)
BILIRUBIN TOTAL: 0.4 mg/dL (ref 0.0–1.2)
BUN / CREAT RATIO: 16 (ref 12–28)
BUN: 10 mg/dL (ref 8–27)
CHLORIDE: 99 mmol/L (ref 96–106)
CO2: 26 mmol/L (ref 20–29)
Calcium: 9.7 mg/dL (ref 8.7–10.3)
Creatinine, Ser: 0.62 mg/dL (ref 0.57–1.00)
GFR calc non Af Amer: 94 mL/min/{1.73_m2} (ref >59)
GFR, EST AFRICAN AMERICAN: 109 mL/min/{1.73_m2} (ref >59)
GLOBULIN, TOTAL: 2.5 g/dL (ref 1.5–4.5)
Glucose: 108 mg/dL — ABNORMAL HIGH (ref 65–99)
POTASSIUM: 4 mmol/L (ref 3.5–5.2)
Sodium: 140 mmol/L (ref 134–144)
TOTAL PROTEIN: 7.4 g/dL (ref 6.0–8.5)

## 2017-12-04 LAB — ALLERGENS W/TOTAL IGE AREA 2
Alternaria Alternata IgE: <0.1 kU/L
Cedar, Mountain IgE: 0.1 kU/L — AB
Cockroach, German IgE: <0.1 kU/L
Common Silver Birch IgE: <0.1 kU/L
D Farinae IgE: >100 kU/L — AB
D Pteronyssinus IgE: >100 kU/L — AB
E001-IGE CAT DANDER: 0.83 kU/L — AB
E005-IGE DOG DANDER: 10.9 kU/L — AB
IGE (IMMUNOGLOBULIN E), SERUM: 663 [IU]/mL — AB (ref 6–495)
M001-IGE PENICILLIUM CHRYSOGEN: 0.24 kU/L — AB
Maple/Box Elder IgE: <0.1 kU/L
Oak, White IgE: <0.1 kU/L
Pigweed, Rough IgE: <0.1 kU/L
Ragweed, Short IgE: <0.1 kU/L
Sheep Sorrel IgE Qn: <0.1 kU/L

## 2017-12-04 LAB — CBC WITH DIFFERENTIAL/PLATELET
BASOS: 1 %
Basophils Absolute: 0.1 10*3/uL (ref 0.0–0.2)
EOS (ABSOLUTE): 0.5 10*3/uL — ABNORMAL HIGH (ref 0.0–0.4)
Eos: 7 %
HEMATOCRIT: 45.5 % (ref 34.0–46.6)
HEMOGLOBIN: 15.7 g/dL (ref 11.1–15.9)
IMMATURE GRANS (ABS): 0 10*3/uL (ref 0.0–0.1)
Immature Granulocytes: 0 %
LYMPHS ABS: 1.5 10*3/uL (ref 0.7–3.1)
LYMPHS: 20 %
MCH: 30.2 pg (ref 26.6–33.0)
MCHC: 34.5 g/dL (ref 31.5–35.7)
MCV: 88 fL (ref 79–97)
MONOCYTES: 10 %
Monocytes Absolute: 0.7 10*3/uL (ref 0.1–0.9)
NEUTROS ABS: 4.5 10*3/uL (ref 1.4–7.0)
Neutrophils: 62 %
Platelets: 289 10*3/uL (ref 150–450)
RBC: 5.2 x10E6/uL (ref 3.77–5.28)
RDW: 12.7 % (ref 12.3–15.4)
WBC: 7.2 10*3/uL (ref 3.4–10.8)

## 2017-12-04 LAB — DIPHTHERIA / TETANUS ANTIBODY PANEL
Diphtheria Ab: 1.26 IU/mL (ref <0.10)
Tetanus Ab, IgG: 3.44 IU/mL (ref <0.10)

## 2017-12-10 ENCOUNTER — Telehealth: Payer: Self-pay

## 2017-12-10 MED ORDER — FLUTICASONE-SALMETEROL 250-50 MCG/DOSE IN AEPB: 1.0000 | INHALATION_SPRAY | Freq: Two times a day (BID) | RESPIRATORY_TRACT | 2 refills | Status: DC

## 2017-12-10 MED ORDER — AZELASTINE HCL 0.1 % NA SOLN: 2.0000 | Freq: Two times a day (BID) | NASAL | 5 refills | Status: DC

## 2017-12-10 MED ORDER — MONTELUKAST SODIUM 10 MG PO TABS: 10.0000 mg | ORAL_TABLET | Freq: Every day | ORAL | 5 refills | Status: DC

## 2017-12-10 NOTE — Addendum Note (Signed)
Addended by: Orlene Erm on: 12/10/2017 06:29 PM   Modules accepted: Orders

## 2017-12-10 NOTE — Telephone Encounter (Signed)
rx for wixela 250/50 sent in called patient left message advising of this

## 2017-12-10 NOTE — Telephone Encounter (Signed)
Yes she needs to continue on Wixela (250mg -34mcg) 1 puff twice a day.  Please send in this refill.

## 2017-12-10 NOTE — Telephone Encounter (Signed)
Pt called stating her Rx has not been put into her pharmacy yet. I sent in Singular and Astelin. Pt also would like to know if Dr. Nelva Bush could prescribe Wixela 1 puff twice daily because her PCP won't refill it until she comes in for an appt and if we want her to take it we would need to send in a script for it.

## 2017-12-16 ENCOUNTER — Ambulatory Visit (INDEPENDENT_AMBULATORY_CARE_PROVIDER_SITE_OTHER): Payer: Medicare Other | Admitting: Psychology

## 2017-12-16 DIAGNOSIS — F3181 Bipolar II disorder: Secondary | ICD-10-CM

## 2017-12-27 ENCOUNTER — Other Ambulatory Visit: Payer: Self-pay | Admitting: Internal Medicine

## 2017-12-27 ENCOUNTER — Other Ambulatory Visit: Payer: Self-pay | Admitting: *Deleted

## 2017-12-27 ENCOUNTER — Other Ambulatory Visit: Payer: Self-pay | Admitting: Emergency Medicine

## 2017-12-27 MED ORDER — ALBUTEROL SULFATE HFA 108 (90 BASE) MCG/ACT IN AERS: INHALATION_SPRAY | RESPIRATORY_TRACT | 0 refills | Status: DC

## 2017-12-27 MED ORDER — ALBUTEROL SULFATE HFA 108 (90 BASE) MCG/ACT IN AERS: INHALATION_SPRAY | RESPIRATORY_TRACT | 3 refills | Status: DC

## 2017-12-27 NOTE — Telephone Encounter (Signed)
Copied from [place] [id number]. Topic: Quick Communication - Rx Refill/Question >> Dec 27, 2017  3:03 PM Toni Hicks wrote: Medication: albuterol (PROVENTIL HFA;VENTOLIN HFA) 108 (90 Base) MCG/ACT inhaler  Pt ran out today she states that its important to get this medicine she has to breath over the holiday and this weekend she thought she had refills on this   Has the patient contacted their pharmacy? Yes.   (Agent: If no, request that the patient contact the pharmacy for the refill.) (Agent: If yes, when and what did the pharmacy advise?) to call provider to get this filled today  Preferred Pharmacy (with phone number or street name): [id number] - , [place] [address] [phone] (Phone) [phone] (Fax)    Agent: Please be advised that RX refills may take up to 3 business days. We ask that you follow-up with your pharmacy.

## 2017-12-27 NOTE — Progress Notes (Signed)
Requested Prescriptions  Pending Prescriptions Disp Refills  . albuterol (PROVENTIL HFA;VENTOLIN HFA) 108 (90 Base) MCG/ACT inhaler 18 Inhaler 3    Sig: INHALE 2 PUFFS EVERY 6 HOURS AS NEEDED FOR WHEEZING OR SHORTNESS OF BREATH     There is no refill protocol information for this order

## 2017-12-31 ENCOUNTER — Other Ambulatory Visit: Payer: Self-pay | Admitting: Internal Medicine

## 2018-01-14 ENCOUNTER — Ambulatory Visit (INDEPENDENT_AMBULATORY_CARE_PROVIDER_SITE_OTHER): Payer: Medicare Other | Admitting: Family Medicine

## 2018-01-14 ENCOUNTER — Encounter: Payer: Self-pay | Admitting: Family Medicine

## 2018-01-14 ENCOUNTER — Ambulatory Visit (INDEPENDENT_AMBULATORY_CARE_PROVIDER_SITE_OTHER)
Admission: RE | Admit: 2018-01-14 | Discharge: 2018-01-14 | Disposition: A | Discharge status: Home / Self Care | Payer: Medicare Other | Source: Ambulatory Visit | Attending: Family Medicine | Admitting: Family Medicine

## 2018-01-14 VITALS — BP 138/82 | HR 81 | Ht 60.0 in | Wt 174.0 lb

## 2018-01-14 DIAGNOSIS — M25552 Pain in left hip: Secondary | ICD-10-CM | POA: Diagnosis not present

## 2018-01-14 DIAGNOSIS — M1612 Unilateral primary osteoarthritis, left hip: Secondary | ICD-10-CM | POA: Diagnosis not present

## 2018-01-14 DIAGNOSIS — M4807 Spinal stenosis, lumbosacral region: Secondary | ICD-10-CM | POA: Diagnosis not present

## 2018-01-14 MED ORDER — VITAMIN D (ERGOCALCIFEROL) 1.25 MG (50000 UNIT) PO CAPS: 50000.0000 [IU] | ORAL_CAPSULE | ORAL | 0 refills | Status: DC

## 2018-01-14 NOTE — Patient Instructions (Addendum)
Good to see you  Ice is your friend Ice 20 minutes 2 times daily. Usually after activity and before bed. Look at more basic foods Also more omega 3 foods and less omega 6 foods.  Xrays downstairs Once weekly vitamin D for 12 weeks Over the counter add tart cherry extract any dose at night See me again in 4 weeks and lets see how we are doing

## 2018-01-14 NOTE — Progress Notes (Signed)
[name] [place] [place], [place] [address] Phone: [phone] Subjective:   [name], am serving as a scribe for Dr. Hulan Saas.  I'm seeing this patient by the request  of:  Hoyt Koch, MD   CC: Left hip pain  [id number]  Toni Hicks is a 68 y.o. female coming in with complaint of left hip pain for 20 years. Patient states that initial injury occurred when she was carrying a bucket of wet sand and her hip "went out." Patient states pain is deep in the hip and radiates down into her leg. Constant dull pain. Hip abduction causes sharp pain. Has had rossiter therapy. Patient feels like she has a leg length discrepancy that has developed since the initial injury 20 years ago that contributes to her pain.      Past Medical History:  Diagnosis Date  . ADD (attention deficit disorder)   . Adult acne   . Alcohol abuse   . Allergic rhinitis   . Anxiety and depression   . Asthma   . Congenital pneumonia   . Eczema   . Hypoglycemia    no longer per pt   . Hypothyroidism   . Impaired fasting blood sugar   . Mycoplasma pneumonia   . Restless leg syndrome   . Seasonal allergies   . Stroke ([place])   . Urticaria    Past Surgical History:  Procedure Laterality Date  . DILATION AND CURETTAGE OF UTERUS     @ 68 years old  . WISDOM TOOTH EXTRACTION     Social History   Socioeconomic History  . Marital status: Widowed    Spouse name: has boyfriend Sam  . Number of children: 2  . Years of education: 75  . Highest education level: Some college, no degree  Occupational History  . Occupation: Clinical biochemist    Comment: Semi-retired  Social Needs  . Financial resource strain: Not on file  . Food insecurity:    Worry: Not on file    Inability: Not on file  . Transportation needs:    Medical: Not on file    Non-medical: Not on file  Tobacco Use  . Smoking status: Former Smoker    Types: Cigarettes    Last  attempt to quit: 1980    Years since quitting: 40.0  . Smokeless tobacco: Never Used  Substance and Sexual Activity  . Alcohol use: Yes    Alcohol/week: 0.0 standard drinks    Comment: 3-5 glasses of wine day   . Drug use: No  . Sexual activity: Not on file  Lifestyle  . Physical activity:    Days per week: Not on file    Minutes per session: Not on file  . Stress: Not on file  Relationships  . Social connections:    Talks on phone: Not on file    Gets together: Not on file    Attends religious service: Not on file    Active member of club or organization: Not on file    Attends meetings of clubs or organizations: Not on file    Relationship status: Not on file  Other Topics Concern  . Not on file  Social History Narrative   HS[employer], [employer] MA- photography   Married '73- 10 years divorced, married '86- 66 years divorced, Married '96- 69 years- widowed.   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   Caffeine occasional   Allergies  Allergen Reactions  . Other     Can not eat carbohydrates without protein, is allergic to certain foods but can take in certain doses   Family History  Problem Relation Age of Onset  . COPD Father   . Heart failure Father   . Coronary artery disease Father   . Cancer Mother        breast  . Hypertension Mother   . Dementia Mother   . Stroke Mother   . Breast cancer Mother      Current Outpatient Medications (Cardiovascular):  .  amLODipine (NORVASC) 5 MG tablet, Take 1 tablet (5 mg total) by mouth daily. Needs annual visit with labs for further refills  Current Outpatient Medications (Respiratory):  .  albuterol (PROVENTIL HFA;VENTOLIN HFA) 108 (90 Base) MCG/ACT inhaler, INHALE 2 PUFFS EVERY 6 HOURS AS NEEDED FOR WHEEZING OR SHORTNESS OF BREATH .  azelastine (ASTELIN) 0.1 % nasal spray, Place 2 sprays into both nostrils 2 (two)  times daily. .  Fluticasone-Salmeterol (WIXELA INHUB) 250-50 MCG/DOSE AEPB, Inhale 1 puff into the lungs 2 (two) times daily. Need annual appointment for further refills .  montelukast (SINGULAIR) 10 MG tablet, Take 1 tablet (10 mg total) by mouth at bedtime.  Current Outpatient Medications (Analgesics):  .  aspirin EC 81 MG tablet, Take 81 mg by mouth daily.   Current Outpatient Medications (Other):  .  b complex vitamins tablet, Take 1 tablet by mouth as needed.   .  fluocinonide cream (LIDEX) 0.05 %, APPLY TOPICALLY 3 (THREE) TIMES DAILY AS NEEDED (SKIN IRRITATION). [name] Kitchen  Spacer/Aero-Holding Chambers (AEROCHAMBER PLUS) inhaler, Use as instructed .  tolterodine (DETROL LA) 4 MG 24 hr capsule, Take 1 capsule (4 mg total) by mouth daily. .  traZODone (DESYREL) 50 MG tablet, Take 1 tablet (50 mg total) by mouth at bedtime. .  Vitamin D, Ergocalciferol, (DRISDOL) 1.25 MG (50000 UT) CAPS capsule, Take 1 capsule (50,000 Units total) by mouth every 7 (seven) days.    Past medical history, social, surgical and family history all reviewed in electronic medical record.  No pertanent information unless stated regarding to the chief complaint.   Review of Systems:  No headache, visual changes, nausea, vomiting, diarrhea, constipation, dizziness, abdominal pain, skin rash, fevers, chills, night sweats, weight loss, swollen lymph nodes, body aches, joint swelling, , chest pain, shortness of breath, mood changes.  Positive muscle aches  Objective  Blood pressure 138/82, pulse 81, height 5' (1.524 m), weight 174 lb (78.9 kg), SpO2 96 %.   General: No apparent distress alert and oriented x3 mood and affect normal, dressed appropriately.  HEENT: Pupils equal, extraocular movements intact  Respiratory: Patient's speak in full sentences and does not appear short of breath  Cardiovascular: No lower extremity edema, non tender, no erythema  Skin: Warm dry intact with no signs of infection or rash on  extremities or on axial skeleton.  Abdomen: Soft nontender  Neuro: Cranial nerves II through XII are intact, neurovascularly intact in all extremities with 2+ DTRs and 2+ pulses.  Lymph: No lymphadenopathy of posterior or anterior cervical chain or axillae bilaterally.  Gait severely antalgic walks with the aid of a cane MSK:  Non tender with limited range of motion and good stability and symmetric strength and tone of shoulders, elbows, wrist, hip, knee and ankles bilaterally.  Mild arthritic changes Hip: Left ROM IR: No internal rotation.  Patient sits with an external range of motion. Positive Corky Sox unable to really do it on the left side Mild SI joint tenderness but normal minimal SI movement.        Impression and Recommendations:     This case required medical decision making of moderate complexity. The above documentation has been reviewed and is accurate and complete Lyndal Pulley, DO       Note: This dictation was prepared with Dragon dictation along with smaller phrase technology. Any transcriptional errors that result from this process are unintentional.

## 2018-01-14 NOTE — Assessment & Plan Note (Signed)
Left hip arthritis.  Discussed icing regimen and home exercise.  Which activities to do which wants to avoid. X-rays ordered today.  We discussed we could consider possible injections but I do not know if it will be beneficial.  Discussed icing regimen and home exercise.  Discussed topical anti-inflammatories.  Patient will follow once weekly vitamin D for any calcific changes as well as help with muscle strength muscle endurance.  Follow-up with me again in 4 weeks

## 2018-01-15 ENCOUNTER — Ambulatory Visit: Payer: Medicare Other | Admitting: Allergy

## 2018-01-15 ENCOUNTER — Other Ambulatory Visit: Payer: Self-pay | Admitting: Internal Medicine

## 2018-01-18 ENCOUNTER — Encounter: Payer: Self-pay | Admitting: Neurology

## 2018-01-20 ENCOUNTER — Ambulatory Visit: Payer: Medicare Other | Admitting: Neurology

## 2018-01-21 ENCOUNTER — Encounter: Payer: Self-pay | Admitting: Neurology

## 2018-01-21 ENCOUNTER — Ambulatory Visit (INDEPENDENT_AMBULATORY_CARE_PROVIDER_SITE_OTHER): Payer: Medicare Other | Admitting: Neurology

## 2018-01-21 VITALS — BP 139/76 | HR 69 | Ht 60.0 in | Wt 179.0 lb

## 2018-01-21 DIAGNOSIS — J189 Pneumonia, unspecified organism: Secondary | ICD-10-CM

## 2018-01-21 DIAGNOSIS — Z9989 Dependence on other enabling machines and devices: Secondary | ICD-10-CM | POA: Diagnosis not present

## 2018-01-21 DIAGNOSIS — F5105 Insomnia due to other mental disorder: Secondary | ICD-10-CM

## 2018-01-21 DIAGNOSIS — F419 Anxiety disorder, unspecified: Secondary | ICD-10-CM | POA: Diagnosis not present

## 2018-01-21 DIAGNOSIS — G4733 Obstructive sleep apnea (adult) (pediatric): Secondary | ICD-10-CM | POA: Diagnosis not present

## 2018-01-21 DIAGNOSIS — F99 Mental disorder, not otherwise specified: Secondary | ICD-10-CM

## 2018-01-21 MED ORDER — ALPRAZOLAM 0.5 MG PO TABS: 0.2500 mg | ORAL_TABLET | Freq: Every evening | ORAL | 0 refills | Status: DC | PRN

## 2018-01-21 NOTE — Progress Notes (Signed)
SLEEP MEDICINE CLINIC   Provider:  Larey Seat, M D  Primary Care Physician:  Hoyt Koch, MD   Referring Provider:  Sarina Ill, MD and Marcial Pacas, MD at [facility]      HPI:  Toni Hicks is a 68 y.o. female , seen on 09-23-2017 in a RV.  She was seen here as in a referral after she suffered a stroke, and has seen Dr Krista Blue and later Dr. Jaynee Eagles, who iniated this referral. Toni Hicks had suffered a stroke but not within the last 6 months.  She has originally seen pulmonologist Dr. Annamaria Boots, and then most recently Dr. Sarina Ill.    RV 01-21-2018,  I will today reviewed the changing sleep habits with Toni Hicks. Sanjuan, meanwhile 68 year old Caucasian female who has stated that she has improved her sleep hygiene.  She soaks in a jacuzzi before she goes to bed, And she will be able to initiate sleep but she still wakes up earlier when she desires and sometimes nocturnal sleep may end at 1-2 AM more. She feels anxious when awake, was asked by her therapist read and do boring things. She has adjusted to CPAP and she feels her sleep improved, her AHi is 1.6, using it for 5 hours on average. 9 cm water with 1 cm EPR. 77% compliance by time, 93% by days. She sleeps well in a king size bed.    I had the pleasure of seeing Toni Hicks here today on 16 September in a revisit after she underwent a split-night polysomnography. This took place on 08 April 2017, referral by Dr. Sarina Ill.  She had very mild REM dependent obstructive sleep apnea with an AHI under 11/h but during REM sleep her apnea accentuated to 63/h of sleep.   Hypoxemia was borderline there were just under 30 minutes of total time and hypoxia measured, but it amounted to almost 20% given her brief sleep time overall.  We had recommended changes in sleep hygiene which were discussed at the last visit with [name], and a follow-up for an attended CPAP titration whole night.  Also discussed with the avoidance of  caffeine-containing beverages, alcohol and nicotine, and sleep psychology-cognitive behavioral referral.  The CPAP titration was then repeated on 14 May 2017 and begun at 5 cm water pressure which was step-by-step increased to 9 cm water pressure.  I met 9 cm CPAP there was no longer any apnea noted, she used a DreamWear nasal pillow and small size.  There was no REM sleep rebounding at 21.6% of the total sleep architecture, EKG was a normal sinus rhythm, there were some periodic limb movements and some of them woke the patient.  This time at 7.6/h.  I ordered an auto titration capable device.  CPAP compliance was performed 80% on 24 out of 30 days,  but only 11 of these 24 days over 4 hours.   Average use of time currently is 2 hours 48 minutes which may be partially due to the patient's tendency to have upper airway infections and congestion.  CPAP is an auto titratable device but is set at 9 cmH2O pressure was 1 cm EPR, residual AHI was 4.6 which leads me to believe that the actually need to increase the pressure.  She did have some moderate air leaks, these may have been more related to displacing the mask and actually having trouble with oral Ventolin.  There were no central apneas emerging.  The patient states that she has  gained about 1 hour of nocturnal sleep by using CPAP, but she has not had the desired effect on nocturia.  For this reason nurse practitioner [name] had prescribed her Detrol after Myrbetriq had no effect and was very expensive.   She has not tried the [employer] yet, she also has not tried the trazodone yet which is a non-addictive medication that can help initiating and maintaining sleep.   The patient states that she had severe psychiatric side effects just from using Nasacort ( Nasonex in the past) "once a week " and became actually suicidal.  Given this I would think that she is exquisitely sensitive to steroids, prednisone while she can tolerate marihuana and alcohol. She would like  to have an ENT opnion on her sinusitis.  She is using a fit bit - and that documented about 6 hours of sleep.    She carries a diagnosis of allergic rhinitis, asthma, history of alcohol abuse, chronic pneumonia or recurrent pneumonias.  Impaired fasting blood sugar, mycoplasma pneumonia, restless legs and hypothyroidism.   The patient is currently using albuterol as needed, amlodipine, Advair, baby aspirin daily, Myrbetriq at night,  and has been ordered an AeroChamber plus inhaler ( not received yet ) . She is drinking alcohol daily and smokes marihuana, and is clearly not willing to change that, but is frustrated about the results of CPAP therapy and gets 6 hours of sleep-  and requests Xanax.  She is here because in her last visit with [name] she was prescribed Detrol, and Trazodone- and she looked up all side effects and was told by friends not to even consider taking the mediations. She has not used it, not tried it, she asked to discuss this with me, not the NP.          Consult: Chief complaint according to patient : "I cannot get enough sleep- anxiety " Sleep habits are as follows: The patient usually watches television or is reading in her living room before she retreats to her bedroom.  Her significant other and her family have known that she falls asleep while watching TV, and snoring. The patient presented with excessive daytime sleepiness reflected and an Epworth sleepiness score of 18 points. There was a report of a dry mouth in the morning witnessed l ud snoring and witnessed apneas per spouse. The patient's regular sleep routine is as follows; the patient normally goes to bed around 11 and falls asleep within 10-15 minutes he is awoken 3 times at night and nocturia and is woken by his alarm clock at 6 in the morning. 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   She is " reading" on an electronic device- and does play on face book, on her cell phone.  She retreats  to bed at 10.30- 11.30 Pm, following her boyfriend by an hour.  They don't sleep in the same room.  They hear one another snoring. She sleeps for 2 hours through and wakes up for nocturia, and sometimes by her own snoring. She gets up at 7. 30 and has breakfast, returns to bed - tries again and again to get extra sleep. She has 3-5 nocturias.  She naps every day. She sleeps in increments amounting to 8-12 hours per 24 hours. There is no structure. She is entangled in her sheets, restlessly sleeping. She kicks a lot.    Sleep medical history and family sleep history: deminished lung capacity- almost yearly pneumonia, frequent bronchitis, COPD ?,  apnea and snoring, RLS, audible breathing, frequent breathing.  JENNYLEE UEHARA is a 68 y.o. female here as a referral from Dr. Sharlet Salina , and was seen by Sarina Ill. MD at [facility] on [phone] "for review of MRI of the brain". Patient here today with daughter who provides much information. She is noncompliant with medication and has stopped taking her aspirin and not taking recommended meds such as statin for HDL. Smoked for 20 years, diabetes under control now but was not at one time, smokes marijuana, long history of alcohol use now maybe a bottle a day. She has moments of complete confusion. She has memory loss.Short term memory is worsening. She has untreated depression and anxiety. She snores and wakes herself up snoring, she struggles to go back to sleep, she snorts, breathing irregularities when sleeping. She has irregular breathing, "snorting, stopping, escalating noise" Spent extended time with patient and daughter discussing her moderately advanced microvascular ischemic changes in the brain which puts her at risk for increased strokes, her previous stroke due to small-vessel disease, her non-complaince with medication is increasing her risk for another stroke and multiple other health problems"  Quoted from Dr Jaynee Eagles.    Social history:  Worked as a  Chief Operating Officer, delivered newspapers, irregular hours late night , early mornings. Her daughter is here with her. Lives with her boyfriend.  Used alcohol to excess, marihuana. Smoker with COPD- not asthma. She has quit alcohol, she lives with her boyfriend of 2 decades. She quit drinking, she quit smoking, she sleeps better on CPAP now) 01-21-2018).     Review of Systems: Out of a complete 14 system review, the patient complains of only the following symptoms, and all other reviewed systems are negative.  sinusitis,  Wheezing, insomnia. RLS  Epworth score 8 with naps ! , Fatigue severity score 42   , depression score - she has not endorsed any.    Social History   Socioeconomic History  . Marital status: Widowed    Spouse name: has boyfriend Sam  . Number of children: 2  . Years of education: 67  . Highest education level: Some college, no degree  Occupational History  . Occupation: Clinical biochemist    Comment: Semi-retired  Social Needs  . Financial resource strain: Not on file  . Food insecurity:    Worry: Not on file    Inability: Not on file  . Transportation needs:    Medical: Not on file    Non-medical: Not on file  Tobacco Use  . Smoking status: Former Smoker    Types: Cigarettes    Last attempt to quit: 1980    Years since quitting: 40.0  . Smokeless tobacco: Never Used  Substance and Sexual Activity  . Alcohol use: Yes    Alcohol/week: 0.0 standard drinks    Comment: 3-5 glasses of wine day   . Drug use: No  . Sexual activity: Not on file  Lifestyle  . Physical activity:    Days per week: Not on file    Minutes per session: Not on file  . Stress: Not on file  Relationships  . Social connections:    Talks on phone: Not on file    Gets together: Not on file    Attends religious service: Not on file    Active member of club or organization: Not on file    Attends meetings of clubs or organizations: Not on file    Relationship status: Not on file  . Intimate  partner violence:    Fear of current or ex partner: Not on file    Emotionally abused: Not on file    Physically abused: Not on file    Forced sexual activity: Not on file  Other Topics Concern  . Not on file  Social History Narrative   HS[employer], [employer] MA- photography   Married '73- 10 years divorced, married '86- 36 years divorced, Married '96- 8 years- widowed.   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   Caffeine occasional    Family History  Problem Relation Age of Onset  . COPD Father   . Heart failure Father   . Coronary artery disease Father   . Cancer Mother        breast  . Hypertension Mother   . Dementia Mother   . Stroke Mother   . Breast cancer Mother     Past Medical History:  Diagnosis Date  . ADD (attention deficit disorder)   . Adult acne   . Alcohol abuse   . Allergic rhinitis   . Anxiety and depression   . Asthma   . Congenital pneumonia   . Eczema   . Hypoglycemia    no longer per pt   . Hypothyroidism   . Impaired fasting blood sugar   . Mycoplasma pneumonia   . Restless leg syndrome   . Seasonal allergies   . Stroke ([place])   . Urticaria     Past Surgical History:  Procedure Laterality Date  . DILATION AND CURETTAGE OF UTERUS     @ 68 years old  . WISDOM TOOTH EXTRACTION      Current Outpatient Medications  Medication Sig Dispense Refill  . albuterol (PROVENTIL HFA;VENTOLIN HFA) 108 (90 Base) MCG/ACT inhaler INHALE 2 PUFFS EVERY 6 HOURS AS NEEDED FOR WHEEZING OR SHORTNESS OF BREATH 18 g 0  . amLODipine (NORVASC) 5 MG tablet Take 1 tablet (5 mg total) by mouth daily. Needs annual visit with labs for further refills 30 tablet 0  . b complex vitamins tablet Take 1 tablet by mouth as needed.      . fluocinonide cream (LIDEX) 0.05 % APPLY TOPICALLY 3 (THREE) TIMES DAILY AS NEEDED (SKIN IRRITATION). 120 g 0  . Fluticasone-Salmeterol  (WIXELA INHUB) 250-50 MCG/DOSE AEPB Inhale 1 puff into the lungs 2 (two) times daily. Need annual appointment for further refills 60 each 2  . montelukast (SINGULAIR) 10 MG tablet Take 1 tablet (10 mg total) by mouth at bedtime. 30 tablet 5  . Spacer/Aero-Holding Chambers (AEROCHAMBER PLUS) inhaler Use as instructed 1 each 2  . azelastine (ASTELIN) 0.1 % nasal spray Place 2 sprays into both nostrils 2 (two) times daily. (Patient not taking: Reported on 01/21/2018) 30 mL 5  . tolterodine (DETROL LA) 4 MG 24 hr capsule Take 4 mg by mouth daily.    . traZODone (DESYREL) 50 MG tablet Take 1 tablet (50 mg total) by mouth at bedtime. (Patient not taking: Reported on 01/21/2018) 30 tablet 6  . Vitamin D, Ergocalciferol, (DRISDOL) 1.25 MG (50000 UT) CAPS capsule Take 1 capsule (50,000 Units total) by mouth every 7 (seven) days. (Patient not taking: Reported on 01/21/2018) 12 capsule 0   No current facility-administered medications for this visit.     Allergies as of 01/21/2018 - Review Complete 01/21/2018  Allergen Reaction Noted  . Other  03/05/2014    Vitals:  BP 139/76   Pulse 69   Ht 5' (1.524 m)   Wt 179 lb (81.2 kg)   BMI 34.96 kg/m  Last Weight:  Wt Readings from Last 1 Encounters:  01/21/18 179 lb (81.2 kg)   IFO:YDXA mass index is 34.96 kg/m.     Last Height:   Ht Readings from Last 1 Encounters:  01/21/18 5' (1.524 m)    Physical exam:  General: The patient is awake, alert and appears not in acute distress. The patient is well groomed. Head: Normocephalic, atraumatic. Neck is supple. Mallampati 4-5 ,  neck circumference:15.5 . Nasal airflow congested- all year round . Retrognathia is not seen.  Cardiovascular:  Regular rate and rhythm , without  murmurs or carotid bruit, and without distended neck veins.Respiratory: Lungs are wheezing. Skin:  Without evidence of edema, or rash BMI is 32. The patient's posture is erect .   Neurologic exam : The patient is awake and alert,  oriented to place and time.   MOCA:No flowsheet data found.   Attention span & concentration ability appears normal.  Speech is fluent, without dysarthria, but dysphonia .  Mood and affect are appropriate.  Cranial nerves: Pupils are unequal in size- left larger 5 mm versus right 4 mm-  uses one contact in her right. both briskly reactive to light.  Funduscopic exam without evidence of pallor or edema. Extraocular movements  in vertical and horizontal planes intact and without nystagmus. Visual fields intact.  Hearing to finger rub intact. Facial motor strength is symmetric and tongue moves midline.  Motor exam:   Normal tone, muscle bulk and symmetric strength in all extremities. Sensory:  Fine touch, pinprick and vibration were tested in all extremities. Proprioception tested in the upper extremities was normal. Coordination: Finger-to-nose maneuver  normal without evidence of ataxia, dysmetria or tremor.  Assessment:   25 minutes visit. After physical and neurologic examination, review of laboratory studies,  Personal review of CPAP compliance, sleep diary, results of polysomnography and / or neurophysiology testing and pre-existing records as far as provided in visit., my assessment is    1)  Anxiety on Xanax- helped insomnia. OSA treatment has improved this- gained 1 hour of sleep, to 3-5 hours.  There is a lot of room for improvement  2) OSA on CPAP - improved compliance - was affected by sinusitis.   My concern is that she may have an overlap syndrome between obstructive sleep apnea and hypoxemia due to COPD.   3) Nocturia, she has not responded to Myrbetriq and has not tried [employer]. May have to do with fluid intake, caffeine use has been limited .  4)  Anxiety - Substance user. Ongoing alcohol use, 0.35 liters a day of wine, cut down from 1.5 liters. Marihuana user. Reports suicidal ideation after nasal steroids. She should still have a psychology referral arranged by PCP- Insomnia -  anxiety treatment.  The patient was advised of the nature of the diagnosed disorder , the treatment options and the  risks for general health and wellness arising from not treating the condition.   I spent more than 45  minutes of face to face time with the patient.  Greater than 50% of time was spent in counseling and coordination of care. We have discussed the diagnosis and differential and I answered the patient's questions.    Plan:  Treatment plan and additional workup : ENT for sinusitis ,  Asthma -orthopnea, needs follow up with pulmonology, Insomnia- Psychiatry follow up needed.  I like for her to use CPAP as much as possible , 4 hours at night and that plus nap time.  There was initially no interest in changing drinking and drug use, and now she has and sleeps better- she has one glas of wine and feels hat is not creating a craving for more. And I will not need to maintain her on XANAX- that was for the sleep study only and to get used to CPAP.  She is seeing psychologist now and she works on her anxiety- and this works for her .  She doesn't want to use Trazodone, not Detrol-  RV CPAP 12 month with NP or me.   Larey Seat, MD 9/51/8841, 66:06 AM  Certified in Neurology by ABPN Certified in [place] by [facility] [address] [place], [place] [address]

## 2018-01-23 ENCOUNTER — Ambulatory Visit (INDEPENDENT_AMBULATORY_CARE_PROVIDER_SITE_OTHER): Payer: Medicare Other | Admitting: Psychology

## 2018-01-23 DIAGNOSIS — F3181 Bipolar II disorder: Secondary | ICD-10-CM | POA: Diagnosis not present

## 2018-01-29 ENCOUNTER — Encounter: Payer: Self-pay | Admitting: Allergy

## 2018-01-29 ENCOUNTER — Ambulatory Visit (INDEPENDENT_AMBULATORY_CARE_PROVIDER_SITE_OTHER): Payer: Medicare Other | Admitting: Allergy

## 2018-01-29 VITALS — BP 132/76 | HR 76 | Ht 60.0 in | Wt 170.0 lb

## 2018-01-29 DIAGNOSIS — J339 Nasal polyp, unspecified: Secondary | ICD-10-CM | POA: Diagnosis not present

## 2018-01-29 DIAGNOSIS — G4733 Obstructive sleep apnea (adult) (pediatric): Secondary | ICD-10-CM | POA: Diagnosis not present

## 2018-01-29 DIAGNOSIS — J4540 Moderate persistent asthma, uncomplicated: Secondary | ICD-10-CM | POA: Diagnosis not present

## 2018-01-29 DIAGNOSIS — J3489 Other specified disorders of nose and nasal sinuses: Secondary | ICD-10-CM | POA: Diagnosis not present

## 2018-01-29 DIAGNOSIS — H1013 Acute atopic conjunctivitis, bilateral: Secondary | ICD-10-CM | POA: Insufficient documentation

## 2018-01-29 DIAGNOSIS — J3089 Other allergic rhinitis: Secondary | ICD-10-CM | POA: Diagnosis not present

## 2018-01-29 MED ORDER — FLUTICASONE PROPIONATE 93 MCG/ACT NA EXHU: 2.0000 | INHALANT_SUSPENSION | Freq: Two times a day (BID) | NASAL | 5 refills | Status: DC

## 2018-01-29 NOTE — Progress Notes (Signed)
[address] [place] [address] Dept: [phone]  FOLLOW UP NOTE  Patient ID: Toni Hicks, female    DOB: [birth year]  Age: 68 y.o. MRN: [id number] Date of Office Visit: 01/29/2018  Assessment  Chief Complaint: Allergic Rhinitis   HPI Toni Hicks is a 68 year old female who presents to the clinic for a follow up visit. She reports that her allergic rhinitis remains poorly controlled with symptoms including nasal congestion, throat clearing, and thick post nasal drainage. She is currently using Rhinocort 1-2 sprays daily with moderate relief of symptoms. Asthma is reported as moderately well controlled with occasional shortness of breath and wheeze that occurs with activity. She reports a cough daily that is productive in the morning and dry throughout the day. She is using Wixela 250-50 mcg/dose- 1 puff twice a day, montelukast 10 mg, and albuterol 1-2 times a day. Her current medications are listed in the chart. Of note, she reports that she currently takes montelukast 1/2 tablet in the morning and 1/2 tablet in the evening and that she has slowly worked up to this dose as it made her feel depressed when she initially began this medication. She denies depression and suicidal ideation at this time.    Drug Allergies:  Allergies  Allergen Reactions  . Other     Can not eat carbohydrates without protein, is allergic to certain foods but can take in certain doses    Physical Exam: BP 132/76   Pulse 76   Ht 5' (1.524 m)   Wt 170 lb (77.1 kg)   SpO2 96%   BMI 33.20 kg/m    Physical Exam Vitals signs reviewed.  Constitutional:      Appearance: Normal appearance.  HENT:     Head: Normocephalic.     Right Ear: Tympanic membrane normal.     Left Ear: Tympanic membrane normal.     Nose:     Comments: Bilateral nares edematous and pale with yellow crusty drainage and macerated areas with scattered scabs. Bilateral nasal polyps visible.  Pharynx normal. Ears normal.  Eyes normal.    Mouth/Throat:     Pharynx: Oropharynx is clear.  Eyes:     Conjunctiva/sclera: Conjunctivae normal.  Neck:     Musculoskeletal: Normal range of motion and neck supple.  Cardiovascular:     Rate and Rhythm: Normal rate and regular rhythm.     Heart sounds: Normal heart sounds. No murmur.  Pulmonary:     Effort: Pulmonary effort is normal.     Breath sounds: Normal breath sounds.  Musculoskeletal: Normal range of motion.  Skin:    General: Skin is warm and dry.  Neurological:     Mental Status: She is alert and oriented to person, place, and time.  Psychiatric:        Mood and Affect: Mood normal.        Behavior: Behavior normal.        Thought Content: Thought content normal.        Judgment: Judgment normal.     Diagnostics: FVC 1.81, FEV1 1.19. Predicted FVC 2.64, predicted FEV1 2.01. Spirometry indicates mild restriction and mild obstruction. This is consistent with previous spirometry readings.   Assessment and Plan: 1. Moderate persistent asthma, uncomplicated   2. Nasal polyposis   3. Non-seasonal allergic rhinitis due to other allergic trigger   4. Allergic conjunctivitis of both eyes   5. Nasal septal perforation     Meds ordered this encounter  Medications  . Fluticasone Propionate (XHANCE) 93 MCG/ACT EXHU    Sig: Place 2 sprays into the nose 2 (two) times daily.    Dispense:  16 mL    Refill:  5    Will send out form if required [phone] (Preferred)    Patient Instructions  Allergies   - Begin Xhance 2 sprays twice a day.   - Continue nasal antihistamine, Astelin 1-2 sprays each nostril twice a day for better control of nasal drainage.   - Continue Singulair 10mg  daily - take at bedtime   Nasal polyps - Begin Xhance as above  Asthma  - have access to albuterol inhaler 2 puffs every 4-6 hours as needed for cough/wheeze/shortness of breath/chest tightness.  May use 15-20 minutes prior to activity.   Monitor frequency of use.    -  continue Wixela 1 puff twice a day  - singulair as above  Asthma control goals:   Full participation in all desired activities (may need albuterol before activity)  Albuterol use two time or less a week on average (not counting use with activity)  Cough interfering with sleep two time or less a month  Oral steroids no more than once a year  No hospitalizations  Nasal perforation with septal deviation  - continue recommendations from Dr. Ernesto Rutherford  Sleep Apnea  - continue CPAP use and recommend pulmonology to manage this for you  Call us if this treatment plan is not working well for you or if you have questions about your treatment plan  Follow-up 6-8 weeks or sooner if needed   Return in about 6 weeks (around 03/12/2018).    Thank you for the opportunity to care for this patient.  Please do not hesitate to contact me with questions.  Gareth Morgan, FNP [facility]

## 2018-01-29 NOTE — Patient Instructions (Addendum)
Allergies   - Begin Xhance 2 sprays twice a day.   - Continue nasal antihistamine, Astelin 1-2 sprays each nostril twice a day for better control of nasal drainage.   - Continue Singulair 10mg  daily - take at bedtime   Nasal polyps - Begin Xhance as above  Asthma  - have access to albuterol inhaler 2 puffs every 4-6 hours as needed for cough/wheeze/shortness of breath/chest tightness.  May use 15-20 minutes prior to activity.   Monitor frequency of use.    - continue Wixela 1 puff twice a day  - singulair as above  Asthma control goals:   Full participation in all desired activities (may need albuterol before activity)  Albuterol use two time or less a week on average (not counting use with activity)  Cough interfering with sleep two time or less a month  Oral steroids no more than once a year  No hospitalizations  Nasal perforation with septal deviation  - continue recommendations from Dr. Ernesto Rutherford  Sleep Apnea  - continue CPAP use and recommend pulmonology to manage this for you  Call us if this treatment plan is not working well for you or if you have questions about your treatment plan  Follow-up 6-8 weeks or sooner if needed

## 2018-02-04 ENCOUNTER — Telehealth: Payer: Self-pay | Admitting: *Deleted

## 2018-02-04 NOTE — Telephone Encounter (Addendum)
Started generic Detrol LA- tolterodine 4mg  ER PA on CMM, key [id number]  Dx: nocturia more than twice per night. She has failed myrbetriq.  [facility] is reviewing your PA request. Typically an electronic response will be received within 72 hours. To check for an update later, open this request from your dashboard.

## 2018-02-05 NOTE — Telephone Encounter (Signed)
Received fax from [facility] asking for additional information. questions answered, papers faxed back to [facility].

## 2018-02-07 ENCOUNTER — Telehealth: Payer: Self-pay

## 2018-02-07 ENCOUNTER — Telehealth: Payer: Self-pay | Admitting: *Deleted

## 2018-02-07 NOTE — Telephone Encounter (Signed)
LVM for patient to call back in regards to scheduling AWV with our health coach.  

## 2018-02-07 NOTE — Telephone Encounter (Signed)
Toni Hicks was approved for 1 month through patient insurance company. Will need to send documentation for a PA paperwork to insurance to approve for the other further months. Paperwork is ready to be filled out and sign by provider. Pharmacy has contacted them several attempts with no call back. Patient need to contact pharmacy and [place] regarding this.

## 2018-02-11 ENCOUNTER — Ambulatory Visit (INDEPENDENT_AMBULATORY_CARE_PROVIDER_SITE_OTHER): Payer: Medicare Other | Admitting: Internal Medicine

## 2018-02-11 ENCOUNTER — Other Ambulatory Visit (INDEPENDENT_AMBULATORY_CARE_PROVIDER_SITE_OTHER): Payer: Medicare Other

## 2018-02-11 ENCOUNTER — Encounter: Payer: Self-pay | Admitting: Internal Medicine

## 2018-02-11 VITALS — BP 138/80 | HR 91 | Temp 98.0°F | Ht 60.0 in | Wt 180.0 lb

## 2018-02-11 DIAGNOSIS — Z8673 Personal history of transient ischemic attack (TIA), and cerebral infarction without residual deficits: Secondary | ICD-10-CM | POA: Diagnosis not present

## 2018-02-11 DIAGNOSIS — Z0000 Encounter for general adult medical examination without abnormal findings: Secondary | ICD-10-CM

## 2018-02-11 DIAGNOSIS — R7303 Prediabetes: Secondary | ICD-10-CM

## 2018-02-11 DIAGNOSIS — I10 Essential (primary) hypertension: Secondary | ICD-10-CM

## 2018-02-11 DIAGNOSIS — Z1211 Encounter for screening for malignant neoplasm of colon: Secondary | ICD-10-CM

## 2018-02-11 LAB — COMPREHENSIVE METABOLIC PANEL
ALK PHOS: 89 U/L (ref 39–117)
ALT: 22 U/L (ref 0–35)
AST: 24 U/L (ref 0–37)
Albumin: 4.6 g/dL (ref 3.5–5.2)
BILIRUBIN TOTAL: 0.5 mg/dL (ref 0.2–1.2)
BUN: 15 mg/dL (ref 6–23)
CO2: 31 mEq/L (ref 19–32)
Calcium: 10.1 mg/dL (ref 8.4–10.5)
Chloride: 102 mEq/L (ref 96–112)
Creatinine, Ser: 0.66 mg/dL (ref 0.40–1.20)
GFR: 89.37 mL/min (ref >60.00)
Glucose, Bld: 98 mg/dL (ref 70–99)
Potassium: 4.1 mEq/L (ref 3.5–5.1)
Sodium: 139 mEq/L (ref 135–145)
TOTAL PROTEIN: 7.6 g/dL (ref 6.0–8.3)

## 2018-02-11 LAB — LIPID PANEL
Cholesterol: 162 mg/dL (ref 0–200)
HDL: 61.2 mg/dL (ref >39.00)
LDL Cholesterol: 72 mg/dL (ref 0–99)
NonHDL: 100.51
Total CHOL/HDL Ratio: 3
Triglycerides: 142 mg/dL (ref 0.0–149.0)
VLDL: 28.4 mg/dL (ref 0.0–40.0)

## 2018-02-11 LAB — HEMOGLOBIN A1C: Hgb A1c MFr Bld: 5.9 % (ref 4.6–6.5)

## 2018-02-11 NOTE — Telephone Encounter (Addendum)
Received fax from [employer], Detrol LA denied.  Called patient and requested she call back to discuss her insurance's denial of Detrol LA.

## 2018-02-11 NOTE — Patient Instructions (Addendum)
Think about getting the pneumonia shot as this does help prevent pneumonia.   Health Maintenance, Female Adopting a healthy lifestyle and getting preventive care can go a long way to promote health and wellness. Talk with your health care provider about what schedule of regular examinations is right for you. This is a good chance for you to check in with your provider about disease prevention and staying healthy. In between checkups, there are plenty of things you can do on your own. Experts have done a lot of research about which lifestyle changes and preventive measures are most likely to keep you healthy. Ask your health care provider for more information. Weight and diet Eat a healthy diet  Be sure to include plenty of vegetables, fruits, low-fat dairy products, and lean protein.  Do not eat a lot of foods high in solid fats, added sugars, or salt.  Get regular exercise. This is one of the most important things you can do for your health. ? Most adults should exercise for at least 150 minutes each week. The exercise should increase your heart rate and make you sweat (moderate-intensity exercise). ? Most adults should also do strengthening exercises at least twice a week. This is in addition to the moderate-intensity exercise. Maintain a healthy weight  Body mass index (BMI) is a measurement that can be used to identify possible weight problems. It estimates body fat based on height and weight. Your health care provider can help determine your BMI and help you achieve or maintain a healthy weight.  For females 6 years of age and older: ? A BMI below 18.5 is considered underweight. ? A BMI of 18.5 to 24.9 is normal. ? A BMI of 25 to 29.9 is considered overweight. ? A BMI of 30 and above is considered obese. Watch levels of cholesterol and blood lipids  You should start having your blood tested for lipids and cholesterol at 67 years of age, then have this test every 5 years.  You may  need to have your cholesterol levels checked more often if: ? Your lipid or cholesterol levels are high. ? You are older than 67 years of age. ? You are at high risk for heart disease. Cancer screening Lung Cancer  Lung cancer screening is recommended for adults 64-45 years old who are at high risk for lung cancer because of a history of smoking.  A yearly low-dose CT scan of the lungs is recommended for people who: ? Currently smoke. ? Have quit within the past 15 years. ? Have at least a 30-pack-year history of smoking. A pack year is smoking an average of one pack of cigarettes a day for 1 year.  Yearly screening should continue until it has been 15 years since you quit.  Yearly screening should stop if you develop a health problem that would prevent you from having lung cancer treatment. Breast Cancer  Practice breast self-awareness. This means understanding how your breasts normally appear and feel.  It also means doing regular breast self-exams. Let your health care provider know about any changes, no matter how small.  If you are in your 20s or 30s, you should have a clinical breast exam (CBE) by a health care provider every 1-3 years as part of a regular health exam.  If you are 102 or older, have a CBE every year. Also consider having a breast X-ray (mammogram) every year.  If you have a family history of breast cancer, talk to your health care provider  about genetic screening.  If you are at high risk for breast cancer, talk to your health care provider about having an MRI and a mammogram every year.  Breast cancer gene (BRCA) assessment is recommended for women who have family members with BRCA-related cancers. BRCA-related cancers include: ? Breast. ? Ovarian. ? Tubal. ? Peritoneal cancers.  Results of the assessment will determine the need for genetic counseling and BRCA1 and BRCA2 testing. Cervical Cancer Your health care provider may recommend that you be screened  regularly for cancer of the pelvic organs (ovaries, uterus, and vagina). This screening involves a pelvic examination, including checking for microscopic changes to the surface of your cervix (Pap test). You may be encouraged to have this screening done every 3 years, beginning at age 25.  For women ages 33-65, health care providers may recommend pelvic exams and Pap testing every 3 years, or they may recommend the Pap and pelvic exam, combined with testing for human papilloma virus (HPV), every 5 years. Some types of HPV increase your risk of cervical cancer. Testing for HPV may also be done on women of any age with unclear Pap test results.  Other health care providers may not recommend any screening for nonpregnant women who are considered low risk for pelvic cancer and who do not have symptoms. Ask your health care provider if a screening pelvic exam is right for you.  If you have had past treatment for cervical cancer or a condition that could lead to cancer, you need Pap tests and screening for cancer for at least 20 years after your treatment. If Pap tests have been discontinued, your risk factors (such as having a new sexual partner) need to be reassessed to determine if screening should resume. Some women have medical problems that increase the chance of getting cervical cancer. In these cases, your health care provider may recommend more frequent screening and Pap tests. Colorectal Cancer  This type of cancer can be detected and often prevented.  Routine colorectal cancer screening usually begins at 67 years of age and continues through 67 years of age.  Your health care provider may recommend screening at an earlier age if you have risk factors for colon cancer.  Your health care provider may also recommend using home test kits to check for hidden blood in the stool.  A small camera at the end of a tube can be used to examine your colon directly (sigmoidoscopy or colonoscopy). This is  done to check for the earliest forms of colorectal cancer.  Routine screening usually begins at age 78.  Direct examination of the colon should be repeated every 5-10 years through 67 years of age. However, you may need to be screened more often if early forms of precancerous polyps or small growths are found. Skin Cancer  Check your skin from head to toe regularly.  Tell your health care provider about any new moles or changes in moles, especially if there is a change in a mole's shape or color.  Also tell your health care provider if you have a mole that is larger than the size of a pencil eraser.  Always use sunscreen. Apply sunscreen liberally and repeatedly throughout the day.  Protect yourself by wearing long sleeves, pants, a wide-brimmed hat, and sunglasses whenever you are outside. Heart disease, diabetes, and high blood pressure  High blood pressure causes heart disease and increases the risk of stroke. High blood pressure is more likely to develop in: ? People who have blood  pressure in the high end of the normal range (130-139/85-89 mm Hg). ? People who are overweight or obese. ? People who are African American.  If you are 34-3 years of age, have your blood pressure checked every 3-5 years. If you are 45 years of age or older, have your blood pressure checked every year. You should have your blood pressure measured twice-once when you are at a hospital or clinic, and once when you are not at a hospital or clinic. Record the average of the two measurements. To check your blood pressure when you are not at a hospital or clinic, you can use: ? An automated blood pressure machine at a pharmacy. ? A home blood pressure monitor.  If you are between 1 years and 17 years old, ask your health care provider if you should take aspirin to prevent strokes.  Have regular diabetes screenings. This involves taking a blood sample to check your fasting blood sugar level. ? If you are at a  normal weight and have a low risk for diabetes, have this test once every three years after 67 years of age. ? If you are overweight and have a high risk for diabetes, consider being tested at a younger age or more often. Preventing infection Hepatitis B  If you have a higher risk for hepatitis B, you should be screened for this virus. You are considered at high risk for hepatitis B if: ? You were born in a country where hepatitis B is common. Ask your health care provider which countries are considered high risk. ? Your parents were born in a high-risk country, and you have not been immunized against hepatitis B (hepatitis B vaccine). ? You have HIV or AIDS. ? You use needles to inject street drugs. ? You live with someone who has hepatitis B. ? You have had sex with someone who has hepatitis B. ? You get hemodialysis treatment. ? You take certain medicines for conditions, including cancer, organ transplantation, and autoimmune conditions. Hepatitis C  Blood testing is recommended for: ? Everyone born from [birth year] through 1964. ? Anyone with known risk factors for hepatitis C. Sexually transmitted infections (STIs)  You should be screened for sexually transmitted infections (STIs) including gonorrhea and chlamydia if: ? You are sexually active and are younger than 67 years of age. ? You are older than 67 years of age and your health care provider tells you that you are at risk for this type of infection. ? Your sexual activity has changed since you were last screened and you are at an increased risk for chlamydia or gonorrhea. Ask your health care provider if you are at risk.  If you do not have HIV, but are at risk, it may be recommended that you take a prescription medicine daily to prevent HIV infection. This is called pre-exposure prophylaxis (PrEP). You are considered at risk if: ? You are sexually active and do not regularly use condoms or know the HIV status of your partner(s). ? You  take drugs by injection. ? You are sexually active with a partner who has HIV. Talk with your health care provider about whether you are at high risk of being infected with HIV. If you choose to begin PrEP, you should first be tested for HIV. You should then be tested every 3 months for as long as you are taking PrEP. Pregnancy  If you are premenopausal and you may become pregnant, ask your health care provider about preconception counseling.  If  you may become pregnant, take 400 to 800 micrograms (mcg) of folic acid every day.  If you want to prevent pregnancy, talk to your health care provider about birth control (contraception). Osteoporosis and menopause  Osteoporosis is a disease in which the bones lose minerals and strength with aging. This can result in serious bone fractures. Your risk for osteoporosis can be identified using a bone density scan.  If you are 61 years of age or older, or if you are at risk for osteoporosis and fractures, ask your health care provider if you should be screened.  Ask your health care provider whether you should take a calcium or vitamin D supplement to lower your risk for osteoporosis.  Menopause may have certain physical symptoms and risks.  Hormone replacement therapy may reduce some of these symptoms and risks. Talk to your health care provider about whether hormone replacement therapy is right for you. Follow these instructions at home:  Schedule regular health, dental, and eye exams.  Stay current with your immunizations.  Do not use any tobacco products including cigarettes, chewing tobacco, or electronic cigarettes.  If you are pregnant, do not drink alcohol.  If you are breastfeeding, limit how much and how often you drink alcohol.  Limit alcohol intake to no more than 1 drink per day for nonpregnant women. One drink equals 12 ounces of beer, 5 ounces of wine, or 1 ounces of hard liquor.  Do not use street drugs.  Do not share  needles.  Ask your health care provider for help if you need support or information about quitting drugs.  Tell your health care provider if you often feel depressed.  Tell your health care provider if you have ever been abused or do not feel safe at home. This information is not intended to replace advice given to you by your health care provider. Make sure you discuss any questions you have with your health care provider. Document Released: 07/10/2010 Document Revised: 06/02/2015 Document Reviewed: 09/28/2014 Elsevier Interactive Patient Education  2019 [employer].

## 2018-02-11 NOTE — Telephone Encounter (Addendum)
Called [facility] to check status of Detrol LA PA, spoke with Arbie Cookey who stated it was denied. I advised her a denial letter wasn't received and asked for denial reason. She stated a diagnosis wasn't listed. I advised her I faxed the clinical questionnaire on 02/04/18 that had diagnosis listed. She stated she could not find it in the system, so she did PA on phone.  She stated it will be sent to the Clinical review dept for final review, decision will be faxed within 3 business days.  Call Ref #- [id number]

## 2018-02-11 NOTE — Progress Notes (Signed)
   Subjective:   Patient ID: Toni Hicks, female    DOB: 10/13/1950, 68 y.o.   MRN: [id number]  HPI Here for medicare wellness and physical, no new complaints. Please see A/P for status and treatment of chronic medical problems.   Diet: heart healthy Physical activity: sedentary Depression/mood screen: negative Hearing: intact to whispered voice Visual acuity: grossly normal, performs annual eye exam  ADLs: capable Fall risk: none Home safety: good Cognitive evaluation: intact to orientation, naming, recall and repetition EOL planning: adv directives discussed    Office Visit from 02/11/2018 in [place]  PHQ-2 Total Score  1      I have personally reviewed and have noted 1. The patient's medical and social history - reviewed today no changes 2. Their use of alcohol, tobacco or illicit drugs 3. Their current medications and supplements 4. The patient's functional ability including ADL's, fall risks, home safety risks and hearing or visual impairment. 5. Diet and physical activities 6. Evidence for depression or mood disorders 7. Care team reviewed and updated (available in snapshot)  Review of Systems  Constitutional: Negative.   HENT: Negative.   Eyes: Negative.   Respiratory: Negative for cough, chest tightness and shortness of breath.   Cardiovascular: Negative for chest pain, palpitations and leg swelling.  Gastrointestinal: Negative for abdominal distention, abdominal pain, constipation, diarrhea, nausea and vomiting.  Musculoskeletal: Negative.   Skin: Negative.   Neurological: Negative.   Psychiatric/Behavioral: Negative.     Objective:  Physical Exam Constitutional:      Appearance: She is well-developed. She is obese.  HENT:     Head: Normocephalic and atraumatic.  Neck:     Musculoskeletal: Normal range of motion.  Cardiovascular:     Rate and Rhythm: Normal rate and regular rhythm.  Pulmonary:     Effort: Pulmonary effort  is normal. No respiratory distress.     Breath sounds: Normal breath sounds. No wheezing or rales.  Abdominal:     General: Bowel sounds are normal. There is no distension.     Palpations: Abdomen is soft.     Tenderness: There is no abdominal tenderness. There is no rebound.  Skin:    General: Skin is warm and dry.  Neurological:     Mental Status: She is alert and oriented to person, place, and time.     Coordination: Coordination normal.     Vitals:   02/11/18 1322  BP: 138/80  Pulse: 91  Temp: 98 F (36.7 C)  TempSrc: Oral  SpO2: 95%  Weight: 180 lb (81.6 kg)  Height: 5' (1.524 m)    Assessment & Plan:

## 2018-02-12 DIAGNOSIS — Z8673 Personal history of transient ischemic attack (TIA), and cerebral infarction without residual deficits: Secondary | ICD-10-CM | POA: Insufficient documentation

## 2018-02-12 MED ORDER — AMLODIPINE BESYLATE 5 MG PO TABS: 5.0000 mg | ORAL_TABLET | Freq: Every day | ORAL | 3 refills | Status: DC

## 2018-02-12 NOTE — Assessment & Plan Note (Signed)
Checking HgA1c and adjust as needed.  

## 2018-02-12 NOTE — Assessment & Plan Note (Signed)
Does not appear to be taking aspirin daily and reminded of the importance of this. BP at goal. Checking HgA1c today. No new stroke symptoms.

## 2018-02-12 NOTE — Assessment & Plan Note (Signed)
Flu shot declines. Pneumonia declines. Shingrix declines. Tetanus up to date. Cologuard ordered. Mammogram reminded, pap smear aged out and dexa needs done. Counseled about sun safety and mole surveillance. Counseled about the dangers of distracted driving. Given 10 year screening recommendations.

## 2018-02-12 NOTE — Assessment & Plan Note (Signed)
BP at goal on amlodipine 5 mg daily. Refilled today. Checking CMP and adjust as needed.

## 2018-02-13 ENCOUNTER — Ambulatory Visit: Payer: Medicare Other | Admitting: Family Medicine

## 2018-02-13 ENCOUNTER — Encounter: Payer: Self-pay | Admitting: Family Medicine

## 2018-02-13 VITALS — BP 120/72 | HR 77 | Ht 60.0 in | Wt 180.0 lb

## 2018-02-13 DIAGNOSIS — M25552 Pain in left hip: Secondary | ICD-10-CM | POA: Diagnosis not present

## 2018-02-13 DIAGNOSIS — M1612 Unilateral primary osteoarthritis, left hip: Secondary | ICD-10-CM

## 2018-02-13 NOTE — Patient Instructions (Signed)
Good to see you  Dr Rhona Raider at [facility]  I am here if you have questoins Vitamin D 2000 IU daily

## 2018-02-13 NOTE — Progress Notes (Signed)
[facility] [place], [place] [address] Phone: [phone] Subjective:    I Toni Hicks am serving as a Education administrator for Dr. Hulan Saas.   CC: Left hip arthritis   Toni Hicks       Updated 02/12/2018 Toni Hicks is a 68 y.o. female coming in with complaint of left hip pain. States that it is hard to tell if the hip is getting better. If she steps wrong she gets excruciating pain. Taking supplements and exercising.  Patient continues to have difficulty with the left groin pain.  Patient's x-ray showed severe bone-on-bone osteoarthritic changes.  Was independently visualized today and went over with her in great detail today.  Patient is wanting to know anything else that she can possibly do the surgery.  Has been doing aquatic therapy.     Past Medical History:  Diagnosis Date  . ADD (attention deficit disorder)   . Adult acne   . Alcohol abuse   . Allergic rhinitis   . Anxiety and depression   . Asthma   . Congenital pneumonia   . Eczema   . Hypoglycemia    no longer per pt   . Hypothyroidism   . Impaired fasting blood sugar   . Mycoplasma pneumonia   . Restless leg syndrome   . Seasonal allergies   . Stroke ([place])   . Urticaria    Past Surgical History:  Procedure Laterality Date  . DILATION AND CURETTAGE OF UTERUS     @ 68 years old  . WISDOM TOOTH EXTRACTION     Social History   Socioeconomic History  . Marital status: Widowed    Spouse name: has boyfriend Sam  . Number of children: 2  . Years of education: 69  . Highest education level: Some college, no degree  Occupational History  . Occupation: Clinical biochemist    Comment: Semi-retired  Social Needs  . Financial resource strain: Not on file  . Food insecurity:    Worry: Not on file    Inability: Not on file  . Transportation needs:    Medical: Not on file    Non-medical: Not on file  Tobacco Use  . Smoking status: Former Smoker    Types:  Cigarettes    Last attempt to quit: 1980    Years since quitting: 40.1  . Smokeless tobacco: Never Used  Substance and Sexual Activity  . Alcohol use: Yes    Alcohol/week: 0.0 standard drinks    Comment: 3-5 glasses of wine day   . Drug use: No  . Sexual activity: Not on file  Lifestyle  . Physical activity:    Days per week: Not on file    Minutes per session: Not on file  . Stress: Not on file  Relationships  . Social connections:    Talks on phone: Not on file    Gets together: Not on file    Attends religious service: Not on file    Active member of club or organization: Not on file    Attends meetings of clubs or organizations: Not on file    Relationship status: Not on file  Other Topics Concern  . Not on file  Social History Narrative   [employer], [employer] [place] MA- photography   Married '73- 10 years divorced, married '86- 46 years divorced, Married '96- 70 years- widowed.   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   Caffeine occasional   Allergies  Allergen Reactions  . Other     Can not eat carbohydrates without protein, is allergic to certain foods but can take in certain doses   Family History  Problem Relation Age of Onset  . COPD Father   . Heart failure Father   . Coronary artery disease Father   . Cancer Mother        breast  . Hypertension Mother   . Dementia Mother   . Stroke Mother   . Breast cancer Mother      Current Outpatient Medications (Cardiovascular):  .  amLODipine (NORVASC) 5 MG tablet, Take 1 tablet (5 mg total) by mouth daily.  Current Outpatient Medications (Respiratory):  .  albuterol (PROVENTIL HFA;VENTOLIN HFA) 108 (90 Base) MCG/ACT inhaler, INHALE 2 PUFFS EVERY 6 HOURS AS NEEDED FOR WHEEZING OR SHORTNESS OF BREATH .  azelastine (ASTELIN) 0.1 % nasal spray, Place 2 sprays into both nostrils 2 (two) times daily. .  Fluticasone  Propionate (XHANCE) 93 MCG/ACT EXHU, Place 2 sprays into the nose 2 (two) times daily. .  Fluticasone-Salmeterol (WIXELA INHUB) 250-50 MCG/DOSE AEPB, Inhale 1 puff into the lungs 2 (two) times daily. Need annual appointment for further refills .  montelukast (SINGULAIR) 10 MG tablet, Take 1 tablet (10 mg total) by mouth at bedtime.    Current Outpatient Medications (Other):  [name] Kitchen  ALPRAZolam (XANAX) 0.5 MG tablet, Take 0.5-1 tablets (0.25-0.5 mg total) by mouth at bedtime as needed for anxiety. [name] Kitchen  b complex vitamins tablet, Take 1 tablet by mouth as needed.   .  fluocinonide cream (LIDEX) 0.05 %, APPLY TOPICALLY 3 (THREE) TIMES DAILY AS NEEDED (SKIN IRRITATION). [name] Kitchen  tolterodine (DETROL LA) 4 MG 24 hr capsule, Take 4 mg by mouth daily.    Past medical history, social, surgical and family history all reviewed in electronic medical record.  No pertanent information unless stated regarding to the chief complaint.   Review of Systems:  No headache, visual changes, nausea, vomiting, diarrhea, constipation, dizziness, abdominal pain, skin rash, fevers, chills, night sweats, weight loss, swollen lymph nodes, body aches, joint swelling,  chest pain, shortness of breath, mood changes.  Positive muscle aches  Objective  Blood pressure 120/72, pulse 77, height 5' (1.524 m), weight 180 lb (81.6 kg), SpO2 97 %.  General: No apparent distress alert and oriented x3 mood and affect normal, dressed appropriately.  HEENT: Pupils equal, extraocular movements intact  Respiratory: Patient's speak in full sentences and does not appear short of breath  Cardiovascular: No lower extremity edema, non tender, no erythema  Skin: Warm dry intact with no signs of infection or rash on extremities or on axial skeleton.  Abdomen: Soft nontender  Neuro: Cranial nerves II through XII are intact, neurovascularly intact in all extremities with 2+ DTRs and 2+ pulses.  Lymph: No lymphadenopathy of posterior or anterior cervical  chain or axillae bilaterally.  Gait antalgic gait Left hip-decreased range of motion severely in all planes.  Patient ambulates with the external rotation.  Severe crepitus noted.  Patient does have what appears to be now an anatomical short leg as well.     Impression and Recommendations:    . The above documentation has been reviewed and is accurate and complete Lyndal Pulley, DO       Note: This dictation was prepared with Dragon dictation along with smaller phrase technology. Any transcriptional errors that result from this process  are unintentional.

## 2018-02-13 NOTE — Assessment & Plan Note (Signed)
Discussed with patient in great length.  We discussed that with patient's severe osteoarthritic changes that I did not feel that any type of injection will be beneficial.  Patient is doing aquatic therapy and feels like she is doing well and encouraged her to continue this while she rehabs but the only thing that will actually help her ambulate without wearing out her contralateral hip or her back will be actually surgical intervention and a hip replacement.  Patient was referred today.  Patient's many questions were answered today as well as had her review her x-rays and the entirety.  Patient had many questions about natural approach which I did answer as much as possible.  It was my recommendation though that the only thing that would be curative at this point would be a hip replacement.  Patient voiced understanding and can follow-up with me as needed.  Spent  25 minutes with patient face-to-face and had greater than 50% of counseling including as described above in assessment and plan.

## 2018-02-14 NOTE — Telephone Encounter (Signed)
Form has been complete, signed, and faxed to [facility] with the last office note. Form has been labeled and placed in bulk scanning.

## 2018-02-17 ENCOUNTER — Ambulatory Visit: Payer: Medicare Other | Admitting: Psychology

## 2018-02-18 ENCOUNTER — Telehealth: Payer: Self-pay

## 2018-02-18 NOTE — Telephone Encounter (Signed)
Prior authorization requested for [facility]. This has been approved and sent to the pharmacy. A copy has been sent to be scanned to the patient's chart as well.

## 2018-02-20 ENCOUNTER — Encounter: Payer: Self-pay | Admitting: *Deleted

## 2018-02-20 NOTE — Telephone Encounter (Signed)
Mailed patient a letter advising her of Detrol [place] denial. I sent Good Rx discount card, advised she call if she is having difficulty getting medication.

## 2018-02-21 DIAGNOSIS — G4733 Obstructive sleep apnea (adult) (pediatric): Secondary | ICD-10-CM | POA: Diagnosis not present

## 2018-03-01 DIAGNOSIS — G4733 Obstructive sleep apnea (adult) (pediatric): Secondary | ICD-10-CM | POA: Diagnosis not present

## 2018-03-03 DIAGNOSIS — M1612 Unilateral primary osteoarthritis, left hip: Secondary | ICD-10-CM | POA: Diagnosis not present

## 2018-03-06 ENCOUNTER — Other Ambulatory Visit: Payer: Self-pay

## 2018-03-06 MED ORDER — FLUTICASONE-SALMETEROL 250-50 MCG/DOSE IN AEPB: 1.0000 | INHALATION_SPRAY | Freq: Two times a day (BID) | RESPIRATORY_TRACT | 4 refills | Status: DC

## 2018-03-12 ENCOUNTER — Ambulatory Visit: Payer: Medicare Other | Admitting: Psychology

## 2018-03-12 DIAGNOSIS — F3181 Bipolar II disorder: Secondary | ICD-10-CM | POA: Diagnosis not present

## 2018-03-17 ENCOUNTER — Telehealth: Payer: Self-pay

## 2018-03-17 NOTE — Telephone Encounter (Signed)
Can you please schedule patient for an office visit. Thank you

## 2018-03-17 NOTE — Telephone Encounter (Signed)
Needs visit as no recent EKG

## 2018-03-17 NOTE — Telephone Encounter (Signed)
Copied from [place] [id number]. Topic: Referral - Request for Referral >> Mar 17, 2018  8:30 AM Lennox Solders wrote: Has patient seen PCP for this complaint? Yes. Pt saw dr Tamala Julian in 02-13-2018. Pt will be having left hip replacement. Pt will have dr Rhona Raider office refax medical clearance form for dr crawford to sign. Pt needs dr Sharlet Salina to put in  referrals  to pulmonologist and cardiologist for medical clearance for hip surgery. Pt saw dr Sharlet Salina on 02-11-2018

## 2018-03-18 NOTE — Telephone Encounter (Signed)
I cannot sign off on her surgical clearance without it. If she is getting done at cardiology let us know when done and can reassess form.

## 2018-03-18 NOTE — Telephone Encounter (Signed)
Contacted patient. She is going to see if the surgeon can send the referral the cardiology and pulmonary.

## 2018-03-18 NOTE — Telephone Encounter (Signed)
Called patient to schedule. She asked if this could be done at the Cardiologist when she goes to see them for surgical clearance?

## 2018-03-20 ENCOUNTER — Encounter: Payer: Self-pay | Admitting: Allergy

## 2018-03-20 ENCOUNTER — Ambulatory Visit: Payer: Medicare Other | Admitting: Allergy

## 2018-03-20 ENCOUNTER — Other Ambulatory Visit: Payer: Self-pay

## 2018-03-20 ENCOUNTER — Telehealth: Payer: Self-pay | Admitting: Internal Medicine

## 2018-03-20 ENCOUNTER — Telehealth: Payer: Self-pay

## 2018-03-20 ENCOUNTER — Telehealth: Payer: Self-pay | Admitting: Neurology

## 2018-03-20 VITALS — BP 144/82 | HR 78 | Resp 20

## 2018-03-20 DIAGNOSIS — J339 Nasal polyp, unspecified: Secondary | ICD-10-CM | POA: Diagnosis not present

## 2018-03-20 DIAGNOSIS — G4733 Obstructive sleep apnea (adult) (pediatric): Secondary | ICD-10-CM

## 2018-03-20 DIAGNOSIS — J4540 Moderate persistent asthma, uncomplicated: Secondary | ICD-10-CM

## 2018-03-20 DIAGNOSIS — J3089 Other allergic rhinitis: Secondary | ICD-10-CM | POA: Diagnosis not present

## 2018-03-20 DIAGNOSIS — J3489 Other specified disorders of nose and nasal sinuses: Secondary | ICD-10-CM

## 2018-03-20 NOTE — Progress Notes (Signed)
Follow-up Note  RE: Toni Hicks MRN: [id number] DOB: 02-Feb-1950 Date of Office Visit: 03/20/2018   History of present illness: Toni Hicks is a 68 y.o. female presenting today for follow-up of asthma, allergic rhinitis, nasal polyps with nasal perforation.   She was last seen in the office on 01/29/2018 by NP Gareth Morgan and myself.  At this visit [name] was discussed to help with congestion and polyp management.  She had previously tolerated Flonase but states that it "stopped working".  She states the [place] increased "psych issues" where she states she felt more "depressed, anxiious and combative."  She stopped Xhance.  Nasacort she reports caused similar symptoms.  The only nasal spray she has been able to tolerate up to this point is Rhinocort.  She also states that Singulair worsens symptoms as well but she states splitting in up in 5mg  in Am and 5mg  in PM is better than taking the dose all at once.  She also states he does not want to stop Singulair as she can tell it is working for her allergies and asthma control.  She states she wants to continue to split the dose as she has been doing despite recommending she can stop the medication completely.  She also states her asthma is better controlled with use of Wixela.  She was able to get 3 devices for about $100 which she states is reasonable for her.  She is uses 1 puff twice a day.  She as rare use of albuterol now with Wixela and sinuglair on board.   In regards to her nasal polyps and perforation she states Dr. Ernesto Rutherford told her she no longer needed to keep seeing him.   She continues to use her CPAP machine however no one at this time is following her settings and symptoms and she asked for a pulmonology referral today.     Review of systems: Review of Systems  Constitutional: Negative for chills, fever and malaise/fatigue.  HENT: Positive for congestion. Negative for ear discharge, nosebleeds, sinus pain and sore throat.   Eyes:  Negative for pain, discharge and redness.  Respiratory: Negative for cough, shortness of breath and wheezing.   Cardiovascular: Negative for chest pain.  Gastrointestinal: Negative for abdominal pain, constipation, diarrhea, heartburn, nausea and vomiting.  Musculoskeletal: Negative for joint pain.  Skin: Negative for itching and rash.  Neurological: Negative for headaches.    All other systems negative unless noted above in HPI  Past medical/social/surgical/family history have been reviewed and are unchanged unless specifically indicated below.  No changes  Medication List: Allergies as of 03/20/2018      Reactions   Other    Can not eat carbohydrates without protein, is allergic to certain foods but can take in certain doses      Medication List       Accurate as of March 20, 2018  2:15 PM. Always use your most recent med list.        albuterol 108 (90 Base) MCG/ACT inhaler Commonly known as:  PROVENTIL HFA;VENTOLIN HFA INHALE 2 PUFFS EVERY 6 HOURS AS NEEDED FOR WHEEZING OR SHORTNESS OF BREATH   ALPRAZolam 0.5 MG tablet Commonly known as:  XANAX Take 0.5-1 tablets (0.25-0.5 mg total) by mouth at bedtime as needed for anxiety.   amLODipine 5 MG tablet Commonly known as:  NORVASC Take 1 tablet (5 mg total) by mouth daily.   azelastine 0.1 % nasal spray Commonly known as:  ASTELIN Place 2 sprays  into both nostrils 2 (two) times daily.   b complex vitamins tablet Take 1 tablet by mouth as needed.   fluocinonide cream 0.05 % Commonly known as:  LIDEX APPLY TOPICALLY 3 (THREE) TIMES DAILY AS NEEDED (SKIN IRRITATION).   Fluticasone Propionate 93 MCG/ACT Exhu Commonly known as:  Xhance Place 2 sprays into the nose 2 (two) times daily.   Fluticasone-Salmeterol 250-50 MCG/DOSE Aepb Commonly known as:  Wixela Inhub Inhale 1 puff into the lungs 2 (two) times daily. Need annual appointment for further refills   montelukast 10 MG tablet Commonly known as:  SINGULAIR  Take 1 tablet (10 mg total) by mouth at bedtime.   tolterodine 4 MG 24 hr capsule Commonly known as:  DETROL LA Take 4 mg by mouth daily.       Known medication allergies: Allergies  Allergen Reactions  . Other     Can not eat carbohydrates without protein, is allergic to certain foods but can take in certain doses     Physical examination: Blood pressure (!) 144/82, pulse 78, resp. rate 20, SpO2 95 %.  General: Alert, interactive, in no acute distress. HEENT: PERRLA, TMs pearly gray, turbinates Erythematous bilaterally with pale yellow crusty drainage and macerated areas with scattered scabs.  Bilateral nasal polyps visible.  Pharynx normal without discharge, post-pharynx non erythematous. Neck: Supple without lymphadenopathy. Lungs: Clear to auscultation without wheezing, rhonchi or rales. {no increased work of breathing. CV: Normal S1, S2 without murmurs. Abdomen: Nondistended, nontender. Skin: Warm and dry, without lesions or rashes. Extremities:  No clubbing, cyanosis or edema. Neuro:   Grossly intact.  Diagnositics/Labs: Labs:  Component     Latest Ref Rng & Units 11/28/2017  IgE (Immunoglobulin E), Serum     6 - 495 IU/mL 663 (H)  D Pteronyssinus IgE     Class VI kU/L >100 (A)  D Farinae IgE     Class VI kU/L >100 (A)  Cat Dander IgE     Class II kU/L 0.83 (A)  Dog Dander IgE     Class IV kU/L 10.90 (A)  [place] Grass IgE     Class 0 kU/L <0.10  Timothy Grass IgE     Class 0 kU/L <0.10  Johnson Grass IgE     Class 0 kU/L <0.10  Cockroach, German IgE     Class 0 kU/L <0.10  Penicillium Chrysogen IgE     Class 0/I kU/L 0.24 (A)  Cladosporium Herbarum IgE     Class 0 kU/L <0.10  Aspergillus Fumigatus IgE     Class 0 kU/L <0.10  Alternaria Alternata IgE     Class 0 kU/L <0.10  Maple/Box Elder IgE     Class 0 kU/L <0.10  Common Silver Wendee Copp IgE     Class 0 kU/L <0.10  Cedar, Georgia IgE     Class 0/I kU/L 0.10 (A)  Oak, White IgE     Class 0 kU/L  <0.10  Elm, American IgE     Class 0 kU/L <0.10  Cottonwood IgE     Class 0 kU/L <0.10  Pecan, Hickory IgE     Class 0 kU/L <0.10  White Mulberry IgE     Class 0 kU/L <0.10  Ragweed, Short IgE     Class 0 kU/L <0.10  Pigweed, Rough IgE     Class 0 kU/L <0.10  Sheep Sorrel IgE Qn     Class 0 kU/L <0.10  Mouse Urine IgE     Class 0 kU/L <  0.10    Spirometry: FEV1: 1.09L 54%, FVC: 1.84L 70%, ratio consistent with Obstructive with restrictive pattern  Assessment and plan:   Allergic rhinitis  - continue avoidance measures for dust mites, cat, dog, mold and tree pollen  - stop Xhance due to side effects and resume use of Rhinocort 2 sprays each nostril daily  - advised to perform nasal saline rinses routinely (at least 2-3 times a week if not daily)  - continue nasal antihistamine, Astelin 1-2 sprays each nostril twice a day for better control of nasal drainage.   - Continue Singulair 10mg  daily (5mg  in AM and 5mg  in PM).  Have advised to discontinue due to side effects related to mood/depression/anxiety however she notes improvement in symptoms and does not want to discontinue at this time.    Nasal polyps - resume Rhincort as above - Dupixent injections has been discussed previously as a non-surgical add-on therapy for control of nasal polyps.  At this time she would like to wait until long-term data is available.    Asthma, mod persistent  - have access to albuterol inhaler 2 puffs every 4-6 hours as needed for cough/wheeze/shortness of breath/chest tightness.  May use 15-20 minutes prior to activity.   Monitor frequency of use.    - continue Wixela 1 puff twice a day  - singulair as above  Asthma control goals:   Full participation in all desired activities (may need albuterol before activity)  Albuterol use two time or less a week on average (not counting use with activity)  Cough interfering with sleep two time or less a month  Oral steroids no more than once a year   No hospitalizations  Nasal perforation with septal deviation  - encouraged to perform measures as above as well as nasal saline rinses routinely  Sleep Apnea  - continue CPAP use and recommend pulmonology to manage this for you.  Will place referral for pulmonology.   Follow-up 3-4 months or sooner if needed  I appreciate the opportunity to take part in [place] care. Please do not hesitate to contact me with questions.  Sincerely,   Prudy Feeler, MD Allergy/Immunology [facility]

## 2018-03-20 NOTE — Telephone Encounter (Signed)
Do you mind getting this patient in to see Pulmonary? She has a CPAP but no one is following her on it.  Dr. Nelva Bush does not have preference as to who.

## 2018-03-20 NOTE — Telephone Encounter (Signed)
Is she having problems, why does she need to see pulmonary?

## 2018-03-20 NOTE — Telephone Encounter (Signed)
LVM informing patient of MD response and to call back

## 2018-03-20 NOTE — Patient Instructions (Addendum)
Allergies  - continue avoidance measures for dust mites, cat, dog, mold and tree pollen  - stop Xhance due to side effects and resume use of Rhinocort 2 sprays each nostril daily  - advised to perform nasal saline rinses routinely (at least 2-3 times a week if not daily)  - continue nasal antihistamine, Astelin 1-2 sprays each nostril twice a day for better control of nasal drainage.   - Continue Singulair 10mg  daily (5mg  in AM and 5mg  in PM)    Nasal polyps - resume Rhincort as above - Dupixent injections has been discussed previously as a non-surgical add-on therapy for control of nasal polyps.  At this time she would like to wait until long-term data is available.    Asthma  - have access to albuterol inhaler 2 puffs every 4-6 hours as needed for cough/wheeze/shortness of breath/chest tightness.  May use 15-20 minutes prior to activity.   Monitor frequency of use.    - continue Wixela 1 puff twice a day  - singulair as above  Asthma control goals:   Full participation in all desired activities (may need albuterol before activity)  Albuterol use two time or less a week on average (not counting use with activity)  Cough interfering with sleep two time or less a month  Oral steroids no more than once a year  No hospitalizations  Nasal perforation with septal deviation  - encouraged to perform measures as above as well as nasal saline rinses routinely  Sleep Apnea  - continue CPAP use and recommend pulmonology to manage this for you.  Will place referral for pulmonology.     Follow-up 3-4 months or sooner if needed

## 2018-03-20 NOTE — Telephone Encounter (Signed)
   Dear Dr. Rhona Raider, Our mutual patient, Toni Hicks. Toni Hicks, is currently not treated with any anticoagulation or antiplatelet medication, according to my chart . She has a history of small vessel disease.  She does have significant pulmonary disease and clearance in that regard must come from her primary care physician.  I have no neurologic reason to consider the patient unfit for surgery.  She would be neurologically cleared to have surgery, and she is taking alprazolam regularly and should be able to take that medication with water at midnight prior to surgery. She has a HISTORY OF NON COMPLIANCE.   She should use her CPAP the night before surgery and may need to bring for use post surgery.  Sincerely, Larey Seat, MD

## 2018-03-20 NOTE — Telephone Encounter (Signed)
Copied from [place] [id number]. Topic: Referral - Request for Referral >> Mar 20, 2018 11:10 AM Marin Olp L wrote: Has patient seen PCP for this complaint? Yes.   *If NO, is insurance requiring patient see PCP for this issue before PCP can refer them? Referral for which specialty: pulmonary Preferred provider/office: [facility] Reason for referral: asthma, chronic pneumonia  Would like a call once referral is completed.

## 2018-03-20 NOTE — Telephone Encounter (Signed)
Pt is having hip replacement surgery on around the 1st week in June. She states Dr Dalldorf/[facility] is requesting medical clearance fax to [phone].

## 2018-03-21 ENCOUNTER — Encounter: Payer: Self-pay | Admitting: *Deleted

## 2018-03-21 ENCOUNTER — Telehealth: Payer: Self-pay

## 2018-03-21 NOTE — Telephone Encounter (Signed)
Letter written, printed & ready for MD signature.

## 2018-03-21 NOTE — Telephone Encounter (Signed)
Letter signed & faxed to Dr. Rhona Raider @ [facility] @ [phone]. Received a receipt of confirmation.

## 2018-03-21 NOTE — Telephone Encounter (Signed)
-----   Message from [place], MD sent at 03/20/2018  7:11 PM EDT ----- Please place a pulmonary referral for management of obstructive sleep apnea on CPAP

## 2018-03-21 NOTE — Telephone Encounter (Signed)
Referral has been placed. 

## 2018-03-21 NOTE — Telephone Encounter (Signed)
Referral has been placed. Thanks!

## 2018-04-04 ENCOUNTER — Ambulatory Visit: Payer: Medicare Other | Admitting: Allergy

## 2018-04-07 ENCOUNTER — Other Ambulatory Visit: Payer: Self-pay

## 2018-04-07 ENCOUNTER — Ambulatory Visit: Payer: Medicare Other | Admitting: Psychology

## 2018-04-07 MED ORDER — MONTELUKAST SODIUM 10 MG PO TABS: 10.0000 mg | ORAL_TABLET | Freq: Every day | ORAL | 5 refills | Status: DC

## 2018-04-10 ENCOUNTER — Telehealth: Payer: Self-pay

## 2018-04-10 NOTE — Telephone Encounter (Signed)
Copied from [place] [id number]. Topic: Referral - Request for Referral >> Mar 17, 2018  8:30 AM Lennox Solders wrote: Has patient seen PCP for this complaint? Yes. Pt saw dr Tamala Julian in 02-13-2018. Pt will be having left hip replacement. Pt will have dr Rhona Raider office refax medical clearance form for dr crawford to sign. Pt needs dr Sharlet Salina to put in  referrals  to pulmonologist and cardiologist for medical clearance for hip surgery. Pt saw dr Sharlet Salina on 02-11-2018 >> Apr 10, 2018  9:59 AM Scherrie Gerlach wrote: Wells Guiles at [facility] orth[facility] following up on surgical clearance.  They are going to refax now. Allergy and asthma referred pt to pulmonary. She did ask if we referred pt to cardiology. No we did not.

## 2018-04-10 NOTE — Telephone Encounter (Signed)
Tried calling patient and call would not go through routing to [place] patient calls back

## 2018-04-10 NOTE — Telephone Encounter (Signed)
There is a separate phone note from 03/20/18 about this. Is there a reason she needs to see pulmonary and cardiology? She needs apt for surgical clearance and was informed about this months ago. We cannot fax surgical clearance form without this. We are not doing in person visits at this time.

## 2018-04-16 ENCOUNTER — Telehealth: Payer: Self-pay | Admitting: *Deleted

## 2018-04-16 ENCOUNTER — Encounter: Payer: Self-pay | Admitting: Family Medicine

## 2018-04-16 ENCOUNTER — Ambulatory Visit (INDEPENDENT_AMBULATORY_CARE_PROVIDER_SITE_OTHER): Payer: Medicare Other | Admitting: Family Medicine

## 2018-04-16 ENCOUNTER — Encounter: Payer: Self-pay | Admitting: *Deleted

## 2018-04-16 DIAGNOSIS — M1612 Unilateral primary osteoarthritis, left hip: Secondary | ICD-10-CM | POA: Diagnosis not present

## 2018-04-16 MED ORDER — TOLTERODINE TARTRATE ER 4 MG PO CP24: 4.0000 mg | ORAL_CAPSULE | Freq: Every day | ORAL | 0 refills | Status: DC

## 2018-04-16 MED ORDER — MELOXICAM 15 MG PO TABS: 15.0000 mg | ORAL_TABLET | Freq: Every day | ORAL | 0 refills | Status: DC

## 2018-04-16 MED ORDER — GABAPENTIN 100 MG PO CAPS: 200.0000 mg | ORAL_CAPSULE | Freq: Every day | ORAL | 3 refills | Status: DC

## 2018-04-16 NOTE — Telephone Encounter (Signed)
If you would like to call her.  Could do virtual visit today if needed, send in a prescription may be and in enough pain could see in office for injection to help short-term.  Could do that today or Monday

## 2018-04-16 NOTE — Telephone Encounter (Signed)
Spoke with pt. She would like to do a virtual visit today with Dr. Tamala Julian.

## 2018-04-16 NOTE — Telephone Encounter (Signed)
Pt left msg stating that she is in a lot of pain with her left hip & now right knee. She was scheduled for a hip replacement but now that has been put on hold due to covid. Pt would like to know what her options are.  Do you want her to come in for an OV or virtal?

## 2018-04-16 NOTE — Telephone Encounter (Signed)
Received fax from [facility] about requesting alternative for tolterodine tart ER 4mg  caps.  I called pt and she stated that would like to try this (generic for detrol).  I relayed that from previous message, a letter was mailed to her with goodrx card. From looking on line, I see 30 day supply is $37.52.  She would like to proceed and will go to [facility]. I relayed I will send script for 30 days.  Also relayed that letter for her upcoming surgery when is scheduled has been faxed 03-21-18.  She verbalized understanding.

## 2018-04-16 NOTE — Addendum Note (Signed)
Addended by: Brandon Melnick on: 04/16/2018 02:03 PM   Modules accepted: Orders

## 2018-04-16 NOTE — Progress Notes (Signed)
[name] [address] [place], [place] [address] Phone: [phone] Subjective:    Virtual Visit via Video Note  I connected with Toni Hicks on 04/16/18 at  1:30 PM EDT by a video enabled telemedicine application and verified that I am speaking with the correct person using two identifiers.   I discussed the limitations of evaluation and management by telemedicine and the availability of in person appointments. The patient expressed understanding and agreed to proceed.    I discussed the assessment and treatment plan with the patient. The patient was provided an opportunity to ask questions and all were answered. The patient agreed with the plan and demonstrated an understanding of the instructions.   The patient was advised to call back or seek an in-person evaluation if the symptoms worsen or if the condition fails to improve as anticipated.  I provided 31 minutes of non-face-to-face time during this encounter.   Lyndal Pulley, DO    CC: Left hip pain follow-up  [id number]  Toni Hicks is a 68 y.o. female coming in with complaint of left hip pain.  Patient has had hip pain for some time and was found to have severe osteoarthritic changes of the hip.  Patient had elected to have a surgical replacement done but unfortunately secondary to coronavirus outbreak if this is been postponed.  Patient is wondering what she can do with the pain at this moment.  Patient states pain is on a daily basis at this time, affecting daily activities, was working out in a pool on a regular basis but now the pool is closed as well.  Patient states that even daily activities such as walking around her house has become more difficult.  Wanting to know her options.  At this point may be all the way till August till she has surgical intervention.     Past Medical History:  Diagnosis Date  . ADD (attention deficit disorder)   . Adult acne   . Alcohol abuse   .  Allergic rhinitis   . Anxiety and depression   . Asthma   . Congenital pneumonia   . Eczema   . Hypoglycemia    no longer per pt   . Hypothyroidism   . Impaired fasting blood sugar   . Mycoplasma pneumonia   . Restless leg syndrome   . Seasonal allergies   . Stroke ([place])   . Urticaria    Past Surgical History:  Procedure Laterality Date  . DILATION AND CURETTAGE OF UTERUS     @ 67 years old  . WISDOM TOOTH EXTRACTION     Social History   Socioeconomic History  . Marital status: Widowed    Spouse name: has boyfriend Sam  . Number of children: 2  . Years of education: 63  . Highest education level: Some college, no degree  Occupational History  . Occupation: Clinical biochemist    Comment: Semi-retired  Social Needs  . Financial resource strain: Not on file  . Food insecurity:    Worry: Not on file    Inability: Not on file  . Transportation needs:    Medical: Not on file    Non-medical: Not on file  Tobacco Use  . Smoking status: Former Smoker    Types: Cigarettes    Last attempt to quit: 1980    Years since quitting: 40.2  . Smokeless tobacco: Never Used  Substance and Sexual Activity  . Alcohol use: Yes  Alcohol/week: 0.0 standard drinks    Comment: 3-5 glasses of wine day   . Drug use: No  . Sexual activity: Not on file  Lifestyle  . Physical activity:    Days per week: Not on file    Minutes per session: Not on file  . Stress: Not on file  Relationships  . Social connections:    Talks on phone: Not on file    Gets together: Not on file    Attends religious service: Not on file    Active member of club or organization: Not on file    Attends meetings of clubs or organizations: Not on file    Relationship status: Not on file  Other Topics Concern  . Not on file  Social History Narrative   HS[employer], [employer] MA- photography   Married '73- 10 years divorced, married '86- 62 years divorced, Married '96- 57 years- widowed.   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   Caffeine occasional   Allergies  Allergen Reactions  . Other     Can not eat carbohydrates without protein, is allergic to certain foods but can take in certain doses   Family History  Problem Relation Age of Onset  . COPD Father   . Heart failure Father   . Coronary artery disease Father   . Cancer Mother        breast  . Hypertension Mother   . Dementia Mother   . Stroke Mother   . Breast cancer Mother      Current Outpatient Medications (Cardiovascular):  .  amLODipine (NORVASC) 5 MG tablet, Take 1 tablet (5 mg total) by mouth daily.  Current Outpatient Medications (Respiratory):  .  albuterol (PROVENTIL HFA;VENTOLIN HFA) 108 (90 Base) MCG/ACT inhaler, INHALE 2 PUFFS EVERY 6 HOURS AS NEEDED FOR WHEEZING OR SHORTNESS OF BREATH .  azelastine (ASTELIN) 0.1 % nasal spray, Place 2 sprays into both nostrils 2 (two) times daily. .  Fluticasone Propionate (XHANCE) 93 MCG/ACT EXHU, Place 2 sprays into the nose 2 (two) times daily. (Patient not taking: Reported on 03/20/2018) .  Fluticasone-Salmeterol (WIXELA INHUB) 250-50 MCG/DOSE AEPB, Inhale 1 puff into the lungs 2 (two) times daily. Need annual appointment for further refills .  montelukast (SINGULAIR) 10 MG tablet, Take 1 tablet (10 mg total) by mouth at bedtime.    Current Outpatient Medications (Other):  [name] Kitchen  ALPRAZolam (XANAX) 0.5 MG tablet, Take 0.5-1 tablets (0.25-0.5 mg total) by mouth at bedtime as needed for anxiety. [name] Kitchen  b complex vitamins tablet, Take 1 tablet by mouth as needed.   .  fluocinonide cream (LIDEX) 0.05 %, APPLY TOPICALLY 3 (THREE) TIMES DAILY AS NEEDED (SKIN IRRITATION). [name] Kitchen  tolterodine (DETROL LA) 4 MG 24 hr capsule, Take 4 mg by mouth daily.    Past medical history, social, surgical and family history all reviewed in electronic medical record.  No pertanent information unless stated  regarding to the chief complaint.   Review of Systems:  No headache, visual changes, nausea, vomiting, diarrhea, constipation, dizziness, abdominal pain, skin rash, fevers, chills, night sweats, weight loss, swollen lymph nodes, body aches, joint swelling, chest pain, shortness of breath, mood changes.  Positive muscle aches  Objective  No vitals virtual visit done today   General: No apparent distress alert and oriented x3 mood and affect normal, dressed appropriately.      Impression and  Recommendations:    . The above documentation has been reviewed and is accurate and complete Lyndal Pulley, DO       Note: This dictation was prepared with Dragon dictation along with smaller phrase technology. Any transcriptional errors that result from this process are unintentional.

## 2018-04-16 NOTE — Patient Instructions (Addendum)
Good to "see" you  Meloxicam daily stop all other anti-inflammatories  Gabapentin 200mg  at night Try to stay active but lower impact the better Ice 20 minutes 2 times daily. Usually after activity and before bed. See me again in virtual visit in 2 weeks and if no improvement may need to discuss injection

## 2018-04-16 NOTE — Assessment & Plan Note (Signed)
Discussed with patient in great length.  We discussed different treatment options.  Patient is elected to try the oral anti-inflammatories as well as the gabapentin.  We discussed the prognosis of this hip.  We discussed the importance of her to continue to be active if possible.  Encourage potential weight loss as well.  Patient will try the meloxicam and gabapentin.  Worsening symptoms we would need to consider an intra-articular injection to try to buy some more time.

## 2018-04-17 ENCOUNTER — Other Ambulatory Visit: Payer: Self-pay | Admitting: Internal Medicine

## 2018-04-21 ENCOUNTER — Ambulatory Visit: Payer: Medicare Other | Admitting: Psychology

## 2018-05-13 ENCOUNTER — Ambulatory Visit (INDEPENDENT_AMBULATORY_CARE_PROVIDER_SITE_OTHER): Payer: Medicare Other | Admitting: Family Medicine

## 2018-05-13 ENCOUNTER — Encounter: Payer: Self-pay | Admitting: Family Medicine

## 2018-05-13 DIAGNOSIS — M1612 Unilateral primary osteoarthritis, left hip: Secondary | ICD-10-CM

## 2018-05-13 NOTE — Assessment & Plan Note (Signed)
Doing much better with the medications.  Discussed with patient about icing regimen, home exercise, which activities to do which wants to avoid.  Patient will continue the meloxicam and gabapentin.  Hopefully patient will have surgical intervention in the next 1 to 2 months.  Encourage patient to potentially consider increasing aquatic therapy if she gets up possibility as well.

## 2018-05-13 NOTE — Progress Notes (Signed)
[name] [address], [place] [address] Phone: [phone] Subjective:    Virtual Visit via Video Note  I connected with Gates Rigg on 05/13/18 at  2:45 PM EDT by a video enabled telemedicine application and verified that I am speaking with the correct person using two identifiers.  Location: Patient: In her home residence Provider: I am in my office setting   I discussed the limitations of evaluation and management by telemedicine and the availability of in person appointments. The patient expressed understanding and agreed to proceed.     I discussed the assessment and treatment plan with the patient. The patient was provided an opportunity to ask questions and all were answered. The patient agreed with the plan and demonstrated an understanding of the instructions.   The patient was advised to call back or seek an in-person evaluation if the symptoms worsen or if the condition fails to improve as anticipated.  I provided 23 minutes of non-face-to-face time during this encounter.   Lyndal Pulley, DO    CC: Hip pain follow-up  OFB:PZWCHENIDP  BEENA CATANO is a 68 y.o. female coming in with complaint of severe hip arthritis.  Patient has had it for some time.  Awaiting surgical intervention.  Continued to have pain.  Started meloxicam and gabapentin.  States that this is been the most pain relief as well as sleep aid that she has had in quite some time.  Has noticed maybe some mild increase in swelling once in the morning.     Past Medical History:  Diagnosis Date  . ADD (attention deficit disorder)   . Adult acne   . Alcohol abuse   . Allergic rhinitis   . Anxiety and depression   . Asthma   . Congenital pneumonia   . Eczema   . Hypoglycemia    no longer per pt   . Hypothyroidism   . Impaired fasting blood sugar   . Mycoplasma pneumonia   . Restless leg syndrome   . Seasonal allergies   . Stroke ([place])   .  Urticaria    Past Surgical History:  Procedure Laterality Date  . DILATION AND CURETTAGE OF UTERUS     @ 68 years old  . WISDOM TOOTH EXTRACTION     Social History   Socioeconomic History  . Marital status: Widowed    Spouse name: has boyfriend Sam  . Number of children: 2  . Years of education: 39  . Highest education level: Some college, no degree  Occupational History  . Occupation: Clinical biochemist    Comment: Semi-retired  Social Needs  . Financial resource strain: Not on file  . Food insecurity:    Worry: Not on file    Inability: Not on file  . Transportation needs:    Medical: Not on file    Non-medical: Not on file  Tobacco Use  . Smoking status: Former Smoker    Types: Cigarettes    Last attempt to quit: 1980    Years since quitting: 40.3  . Smokeless tobacco: Never Used  Substance and Sexual Activity  . Alcohol use: Yes    Alcohol/week: 0.0 standard drinks    Comment: 3-5 glasses of wine day   . Drug use: No  . Sexual activity: Not on file  Lifestyle  . Physical activity:    Days per week: Not on file    Minutes per session: Not on file  . Stress:  Not on file  Relationships  . Social connections:    Talks on phone: Not on file    Gets together: Not on file    Attends religious service: Not on file    Active member of club or organization: Not on file    Attends meetings of clubs or organizations: Not on file    Relationship status: Not on file  Other Topics Concern  . Not on file  Social History Narrative   HS[employer], [employer] MA- photography   Married '73- 10 years divorced, married '86- 91 years divorced, Married '96- 69 years- widowed.   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   Caffeine occasional   Allergies  Allergen Reactions  . Other     Can not eat carbohydrates without protein, is allergic to certain foods but can take in  certain doses   Family History  Problem Relation Age of Onset  . COPD Father   . Heart failure Father   . Coronary artery disease Father   . Cancer Mother        breast  . Hypertension Mother   . Dementia Mother   . Stroke Mother   . Breast cancer Mother      Current Outpatient Medications (Cardiovascular):  .  amLODipine (NORVASC) 5 MG tablet, Take 1 tablet (5 mg total) by mouth daily.  Current Outpatient Medications (Respiratory):  .  albuterol (PROVENTIL HFA;VENTOLIN HFA) 108 (90 Base) MCG/ACT inhaler, INHALE 2 PUFFS EVERY 6 HOURS AS NEEDED FOR WHEEZING OR SHORTNESS OF BREATH .  azelastine (ASTELIN) 0.1 % nasal spray, Place 2 sprays into both nostrils 2 (two) times daily. .  Fluticasone Propionate (XHANCE) 93 MCG/ACT EXHU, Place 2 sprays into the nose 2 (two) times daily. (Patient not taking: Reported on 03/20/2018) .  Fluticasone-Salmeterol (WIXELA INHUB) 250-50 MCG/DOSE AEPB, Inhale 1 puff into the lungs 2 (two) times daily. Need annual appointment for further refills .  montelukast (SINGULAIR) 10 MG tablet, Take 1 tablet (10 mg total) by mouth at bedtime.  Current Outpatient Medications (Analgesics):  .  meloxicam (MOBIC) 15 MG tablet, Take 1 tablet (15 mg total) by mouth daily.   Current Outpatient Medications (Other):  [name] Kitchen  ALPRAZolam (XANAX) 0.5 MG tablet, Take 0.5-1 tablets (0.25-0.5 mg total) by mouth at bedtime as needed for anxiety. [name] Kitchen  b complex vitamins tablet, Take 1 tablet by mouth as needed.   .  fluocinonide cream (LIDEX) 0.05 %, APPLY TOPICALLY 3 (THREE) TIMES DAILY AS NEEDED (SKIN IRRITATION). [name] Kitchen  gabapentin (NEURONTIN) 100 MG capsule, Take 2 capsules (200 mg total) by mouth at bedtime. .  tolterodine (DETROL LA) 4 MG 24 hr capsule, Take 1 capsule (4 mg total) by mouth daily.    Past medical history, social, surgical and family history all reviewed in electronic medical record.  No pertanent information unless stated regarding to the chief complaint.   Review  of Systems:  No headache, visual changes, nausea, vomiting, diarrhea, constipation, dizziness, abdominal pain, skin rash, fevers, chills, night sweats, weight loss, swollen lymph nodes, body aches, joint swelling,  chest pain, shortness of breath, mood changes.  Positive muscle aches  Objective     General: No apparent distress alert and oriented x3 mood and affect normal, dressed appropriately.  HEENT: Pupils equal, extraocular movements intact      Impression and Recommendations:    y. The above documentation has  been reviewed and is accurate and complete Lyndal Pulley, DO       Note: This dictation was prepared with Dragon dictation along with smaller phrase technology. Any transcriptional errors that result from this process are unintentional.

## 2018-05-14 ENCOUNTER — Telehealth: Payer: Self-pay | Admitting: *Deleted

## 2018-05-14 DIAGNOSIS — Z01818 Encounter for other preprocedural examination: Secondary | ICD-10-CM

## 2018-05-14 NOTE — Telephone Encounter (Signed)
Copied from [place] [phone]. Topic: Referral - Request for Referral >> May 14, 2018  3:15 PM Rainey Pines A wrote: Has patient seen PCP for this complaint? Yes *If NO, is insurance requiring patient see PCP for this issue before PCP can refer them? Referral for which specialty: Cardiology Preferred provider/office: Patient refers a [facility] provider Reason for referral: Patient stated Dr. Sharlet Salina notified patient she will need to see a cardiologist. Patient requesting a callback.

## 2018-05-15 ENCOUNTER — Other Ambulatory Visit: Payer: Self-pay | Admitting: Family Medicine

## 2018-05-15 NOTE — Telephone Encounter (Signed)
Spoke with patient today. She is trying to reschedule her total hip surgery for next month. Previously before she was r/s she stated she was told she would have to have cardiac clearance prior to surgery. She said she had spoken with you before and you told her once she saw cardiology they could do an EKG on her and you will be able to sign off once this was completed. She said she needs you to refer her to a cardiologist for clearance for her total hip.

## 2018-05-15 NOTE — Telephone Encounter (Signed)
I have advised her before that we would be happy to do a pre-op with ekg for her (this was before pandemic). I am not sure what reason she thinks she needs to see cardiology or who advised her that she needed to see them pre-op. I will leave in my box and review tomorrow if a pre-op apt at the office is appropriate for the near future.

## 2018-05-15 NOTE — Telephone Encounter (Signed)
For what reason does she feel she needs to see cardiology? Is she having a problem?

## 2018-05-16 ENCOUNTER — Encounter: Payer: Self-pay | Admitting: Internal Medicine

## 2018-05-16 NOTE — Telephone Encounter (Signed)
Can you make patient an appointment Monday morning to get the EKG for pre-op we do those in our office. Not sure why patient needs to see cardiology pre-op

## 2018-05-16 NOTE — Telephone Encounter (Signed)
Can you call and see if she wants to come Monday morning 8:20 for physical visit with EKG for pre-op.

## 2018-05-16 NOTE — Telephone Encounter (Signed)
LVM for patient to call back and to make an appointment.

## 2018-05-19 ENCOUNTER — Other Ambulatory Visit: Payer: Self-pay | Admitting: Family Medicine

## 2018-05-26 ENCOUNTER — Ambulatory Visit: Payer: Medicare Other | Admitting: Internal Medicine

## 2018-05-26 ENCOUNTER — Encounter: Payer: Self-pay | Admitting: Internal Medicine

## 2018-05-26 ENCOUNTER — Ambulatory Visit (INDEPENDENT_AMBULATORY_CARE_PROVIDER_SITE_OTHER): Payer: Medicare Other

## 2018-05-26 VITALS — BP 140/82 | HR 77 | Ht 61.0 in | Wt 186.6 lb

## 2018-05-26 DIAGNOSIS — G4733 Obstructive sleep apnea (adult) (pediatric): Secondary | ICD-10-CM | POA: Diagnosis not present

## 2018-05-26 DIAGNOSIS — J449 Chronic obstructive pulmonary disease, unspecified: Secondary | ICD-10-CM

## 2018-05-26 DIAGNOSIS — Z9989 Dependence on other enabling machines and devices: Secondary | ICD-10-CM

## 2018-05-26 DIAGNOSIS — J9811 Atelectasis: Secondary | ICD-10-CM | POA: Diagnosis not present

## 2018-05-26 NOTE — Telephone Encounter (Signed)
Would recommend this is done at our office, she does have have a reason to see cardiologist that I know of and I can clear her medically and cardiac if her EKG and exam is normal.

## 2018-05-26 NOTE — Progress Notes (Signed)
05/26/2018- 68 yoF former smoker who says she is here for Preop clearance, anticipating L THR. Our intake indicated she was here for sleep problems, but she is actively following with Dr Brett Fairy and has a f/u appointment at that office scheduled  Medical problem list includes OSA/ CPAP, Restless Legs, ETOH abuse, ETOH induced insomnia, Allergic rhinitis/ conjunctivitis, Nasal polyps, anxiety, depression, HBP, CVA, Hypothyroid, Asthma, Urticaria, DM NPSG Dohmeier 04/08/2017-  AHI 11.25/ hr, desaturation to 53%, 174 lbs [facility] and allergic rhinitis.  She says she has had pneumonia "65 times", since childhood. Little reflux. Now using Wyxela, wih rescue inhaler used 1-2 x daily. No recent acute respiratory trouble, but daily productive ecough, clear ucus, DOE on hilss, stairs and hurrying. She feels current respiratory meds are working really well and she has no acute needs.  Reports good compliance and control with CPAP 9, and sleeps with less waking using gabapentin. Lives wih boyfriend and denies snoring through CPAP. Office Spirometry 03/20/2018 ([facility]) Moderate obstruction, with FVC 1.84/ 70%, FEV1 1.09/ 54%, ratio 0.59/ 77%, FEF25-75% 0.44/ 24%. She thinks she had pneumonia vaccine, but not documented in this EMR.  Prior to Admission medications   Medication Sig Start Date End Date Taking? Authorizing Provider  albuterol (PROVENTIL HFA;VENTOLIN HFA) 108 (90 Base) MCG/ACT inhaler INHALE 2 PUFFS EVERY 6 HOURS AS NEEDED FOR WHEEZING OR SHORTNESS OF BREATH 04/17/18  Yes Hoyt Koch, MD  ALPRAZolam Duanne Moron) 0.5 MG tablet Take 0.5-1 tablets (0.25-0.5 mg total) by mouth at bedtime as needed for anxiety. 01/21/18  Yes Dohmeier, Asencion Partridge, MD  amLODipine (NORVASC) 5 MG tablet Take 1 tablet (5 mg total) by mouth daily. 02/12/18  Yes Hoyt Koch, MD  azelastine (ASTELIN) 0.1 % nasal spray Place 2 sprays into both nostrils 2 (two) times daily.  12/10/17  Yes Kennith Gain, MD  b complex vitamins tablet Take 1 tablet by mouth as needed.     Yes [provider]  fluocinonide cream (LIDEX) 0.05 % APPLY TOPICALLY 3 (THREE) TIMES DAILY AS NEEDED (SKIN IRRITATION). 08/05/17  Yes Hoyt Koch, MD  Fluticasone Propionate [name]) 93 MCG/ACT EXHU Place 2 sprays into the nose 2 (two) times daily. 01/29/18  Yes Ambs, Kathrine Cords, FNP  Fluticasone-Salmeterol [facility] INHUB) 250-50 MCG/DOSE AEPB Inhale 1 puff into the lungs 2 (two) times daily. Need annual appointment for further refills 03/06/18  Yes Padgett, Rae Halsted, MD  gabapentin (NEURONTIN) 100 MG capsule TAKE 2 CAPSULES (200 MG TOTAL) BY MOUTH AT BEDTIME. 05/15/18  Yes Hulan Saas M, DO  meloxicam (MOBIC) 15 MG tablet TAKE 1 TABLET BY MOUTH EVERY DAY 05/20/18  Yes Hulan Saas M, DO  montelukast (SINGULAIR) 10 MG tablet Take 1 tablet (10 mg total) by mouth at bedtime. 04/07/18  Yes Padgett, Rae Halsted, MD  tolterodine (DETROL LA) 4 MG 24 hr capsule Take 1 capsule (4 mg total) by mouth daily. 04/16/18  Yes Dohmeier, Asencion Partridge, MD   Past Medical History:  Diagnosis Date  . ADD (attention deficit disorder)   . Adult acne   . Alcohol abuse   . Allergic rhinitis   . Anxiety and depression   . Asthma   . Congenital pneumonia   . Eczema   . Hypoglycemia    no longer per pt   . Hypothyroidism   . Impaired fasting blood sugar   . Mycoplasma pneumonia   . Restless leg syndrome   . Seasonal allergies   .  Stroke ([place])   . Urticaria    Past Surgical History:  Procedure Laterality Date  . DILATION AND CURETTAGE OF UTERUS     @ 68 years old  . WISDOM TOOTH EXTRACTION     ROS-see HPI   + = positive Constitutional:    weight loss, night sweats, fevers, chills, fatigue, lassitude. HEENT:    headaches, difficulty swallowing, tooth/dental problems, sore throat,       +sneezing,+ itching, ear ache, +nasal congestion, post nasal drip, snoring CV:    chest pain,  orthopnea, PND, swelling in lower extremities, anasarca,                                  dizziness, +palpitations Resp:   +shortness of breath with exertion or at rest.               + productive cough,   non-productive cough, coughing up of blood.              change in color of mucus.  wheezing.   Skin:    +rash or lesions. GI:    heartburn, +indigestion, abdominal pain, nausea, vomiting, diarrhea,                 change in bowel habits, loss of appetite GU: dysuria, change in color of urine, no urgency or frequency.   flank pain. MS:   joint pain, +stiffness, decreased range of motion, back pain. Neuro-     nothing unusual Psych:  change in mood or affect.  depression or +anxiety.   memory loss.  OBJ- Physical Exam General- Alert, Oriented, Affect-appropriate, Distress- none acute Skin- rash-none, lesions- none, excoriation- none Lymphadenopathy- none Head- atraumatic            Eyes- Gross vision intact, PERRLA, conjunctivae and secretions clear            Ears- Hearing, canals-normal            Nose- Clear, no-Septal dev, mucus, polyps, erosion, perforation             Throat- Mallampati II , mucosa clear , drainage- none, tonsils- atrophic Neck- flexible , trachea midline, no stridor , thyroid nl, carotid no bruit Chest - symmetrical excursion , unlabored           Heart/CV- RRR , no murmur , no gallop  , no rub, nl s1 s2                           - JVD- none , edema- none, stasis changes- none, varices- none           Lung- clear to P&A, wheeze- none, cough- none , dullness-none, rub- none           Chest wall-  Abd-  Br/ Gen/ Rectal- Not done, not indicated Extrem- cyanosis- none, clubbing, none, atrophy- none, strength- nl Neuro- grossly intact to observation

## 2018-05-26 NOTE — Telephone Encounter (Signed)
Patient requesting referral to cardiologist.  States surgeon wants a cardiologist to perform the EKG.  Patient states she does not have heart issues that she knows of.  Patient has anxiety in regard to the urgency of this matter however does not want to have EKG done here at our office.

## 2018-05-26 NOTE — Patient Instructions (Addendum)
Order- CXR  Ok to continue present meds  I encourage you to keep your lungs as clear as you can from smokes and irritants, especially ahead of your hip surgery.  Keep your planned follow-up appointment with Dr Brett Fairy for your sleep management.   If you need help for your breathing issues we will be here to help.

## 2018-05-27 NOTE — Addendum Note (Signed)
Addended by: Pricilla Holm A on: 05/27/2018 10:46 AM   Modules accepted: Orders

## 2018-05-27 NOTE — Telephone Encounter (Signed)
Referral to cardiology done but I am not sure if they are seeing people in the office. Also I did recommend her to have the EKG done at her physical in February and she refused at that time.

## 2018-05-27 NOTE — Telephone Encounter (Signed)
Patient states that she was told since she had a stroke and  Her age surgeon told her to get an EKG from a cardiologist. she states that she would be here to get the EKG if she was not told she needed to see an actual cardiologist to get it done. States that she was told by you that she could go to cardiology previously saying that if she goes to cardiologist it would save her a trip of seeing [place] and cardiologist since we just seen her for physical. States she knows she does not have heart issues but since her history of stroke the surgeon states that is a red flag and needs to be done at a cardiologist. Really apologizes about all this but it does have to be cleared by a cardiologist. Needs a referral please cannot get her surgery unless this is done. She knows it can be done in our office but is being told every time she calls the surgeon that it has to be done at cardiologist.

## 2018-05-27 NOTE — Telephone Encounter (Signed)
Not sure how long it takes to see them, but I called pt to let her know the referral is in and she can call them if she wants.

## 2018-05-27 NOTE — Telephone Encounter (Signed)
Do you know how long this will take if they are seeing patients. Patient is willing to even go on a wait list if need be

## 2018-05-27 NOTE — Telephone Encounter (Signed)
Noted thank you

## 2018-05-28 DIAGNOSIS — J449 Chronic obstructive pulmonary disease, unspecified: Secondary | ICD-10-CM | POA: Insufficient documentation

## 2018-05-28 DIAGNOSIS — J441 Chronic obstructive pulmonary disease with (acute) exacerbation: Secondary | ICD-10-CM | POA: Insufficient documentation

## 2018-05-28 NOTE — Assessment & Plan Note (Signed)
Volume-time spirometry curve from her allergy office looks like emphysema, consistent with her smoking history. She seems well-controlled on current meds, without recent exacerbation. When Covid restrictions ease, we will want to get full PFT. She appears to be clinically stable from a pulmonary standpoint to clear for anticipated hip replacement surgery. Pulmonary consultation will be available if needed during her surgical course.

## 2018-05-28 NOTE — Assessment & Plan Note (Signed)
She indicates good compliance and control on CPAP 9/ [employer], managed by Dr Brett Fairy.

## 2018-05-29 NOTE — Progress Notes (Signed)
Cardiology Office Note   Date:  05/30/2018   ID:  Toni Hicks, Toni Hicks 20-Mar-1950, MRN [id number]  PCP:  Hoyt Koch, MD  Cardiologist:   No primary care provider on file. Referring:  Hoyt Koch, MD  Chief Complaint  Patient presents with  . Pre-op Exam      History of Present Illness: Toni Hicks is a 68 y.o. female who is referred by Hoyt Koch, MD for preop evaluation.  She is in need of a left hip replacement.  She has had no past cardiac history but she does have risk factors and she is had recurrent pneumonias in the past.  Because of this she was referred for preop clearance.   Despite having orthopedic limitations the patient is active.  She denies any cardiovascular symptoms such as chest pressure, neck or arm discomfort.  She has no shortness of breath, PND or orthopnea.  She does have sleep apnea and does have this managed with CPAP.  She has had recurrent pneumonias and is apparently going to have pulmonary function testing because of this.  Of note the patient did have a stroke in 2016.  I reviewed these records.  She had no carotid stenosis.  Echocardiography demonstrated normal left ventricular function and no significant abnormalities.   Past Medical History:  Diagnosis Date  . ADD (attention deficit disorder)   . Adult acne   . Alcohol abuse   . Allergic rhinitis   . Anxiety and depression   . Asthma   . Congenital pneumonia   . Eczema   . Hypoglycemia    no longer per pt   . Hypothyroidism   . Impaired fasting blood sugar   . Mycoplasma pneumonia   . Restless leg syndrome   . Seasonal allergies   . Stroke ([place])   . Urticaria     Past Surgical History:  Procedure Laterality Date  . DILATION AND CURETTAGE OF UTERUS     @ 68 years old  . WISDOM TOOTH EXTRACTION       Current Outpatient Medications  Medication Sig Dispense Refill  . albuterol (PROVENTIL HFA;VENTOLIN HFA) 108 (90 Base) MCG/ACT inhaler INHALE 2  PUFFS EVERY 6 HOURS AS NEEDED FOR WHEEZING OR SHORTNESS OF BREATH 18 Inhaler 3  . ALPRAZolam (XANAX) 0.5 MG tablet Take 0.5-1 tablets (0.25-0.5 mg total) by mouth at bedtime as needed for anxiety. 45 tablet 0  . amLODipine (NORVASC) 5 MG tablet Take 1 tablet (5 mg total) by mouth daily. 90 tablet 3  . azelastine (ASTELIN) 0.1 % nasal spray Place 2 sprays into both nostrils 2 (two) times daily. 30 mL 5  . b complex vitamins tablet Take 1 tablet by mouth as needed.      . fluocinonide cream (LIDEX) 0.05 % APPLY TOPICALLY 3 (THREE) TIMES DAILY AS NEEDED (SKIN IRRITATION). 120 g 0  . Fluticasone Propionate (XHANCE) 93 MCG/ACT EXHU Place 2 sprays into the nose 2 (two) times daily. 16 mL 5  . Fluticasone-Salmeterol (WIXELA INHUB) 250-50 MCG/DOSE AEPB Inhale 1 puff into the lungs 2 (two) times daily. Need annual appointment for further refills 60 each 4  . gabapentin (NEURONTIN) 100 MG capsule TAKE 2 CAPSULES (200 MG TOTAL) BY MOUTH AT BEDTIME. 180 capsule 2  . meloxicam (MOBIC) 15 MG tablet TAKE 1 TABLET BY MOUTH EVERY DAY 30 tablet 0  . montelukast (SINGULAIR) 10 MG tablet Take 1 tablet (10 mg total) by mouth at bedtime. 30 tablet 5  .  tolterodine (DETROL LA) 4 MG 24 hr capsule Take 1 capsule (4 mg total) by mouth daily. 30 capsule 0   No current facility-administered medications for this visit.     Allergies:   Other    Social History:  The patient  reports that she quit smoking about 40 years ago. Her smoking use included cigarettes. She has a 30.00 pack-year smoking history. She has never used smokeless tobacco. She reports current alcohol use. She reports that she does not use drugs.   Family History:  The patient's family history includes Breast cancer in her mother; COPD in her father; Cancer in her mother; Coronary artery disease in her father; Dementia in her mother; Heart failure in her father; Hypertension in her mother; Stroke in her mother.    ROS:  Please see the history of present  illness.   Otherwise, review of systems are positive for none.   All other systems are reviewed and negative.    PHYSICAL EXAM: VS:  BP 135/89   Pulse 84   Ht 5\' 1"  (1.549 m)   Wt 186 lb 3.2 oz (84.5 kg)   BMI 35.18 kg/m  , BMI Body mass index is 35.18 kg/m. GENERAL:  Well appearing HEENT:  Pupils equal round and reactive, fundi not visualized, oral mucosa unremarkable NECK:  No jugular venous distention, waveform within normal limits, carotid upstroke brisk and symmetric, no bruits, no thyromegaly LYMPHATICS:  No cervical, inguinal adenopathy LUNGS:  Clear to auscultation bilaterally BACK:  No CVA tenderness CHEST:  Unremarkable HEART:  PMI not displaced or sustained,S1 and S2 within normal limits, no S3, no S4, no clicks, no rubs, no murmurs ABD:  Flat, positive bowel sounds normal in frequency in pitch, no bruits, no rebound, no guarding, no midline pulsatile mass, no hepatomegaly, no splenomegaly EXT:  2 plus pulses throughout, no edema, no cyanosis no clubbing SKIN:  No rashes no nodules NEURO:  Cranial nerves II through XII grossly intact, motor grossly intact throughout PSYCH:  Cognitively intact, oriented to person place and time    EKG:  EKG is ordered today. The ekg ordered today demonstrates sinus rhythm, rate 84, axis within normal limits, intervals within normal limits, poor anterior R wave progression, no acute ST-T wave changes.   Recent Labs: 11/28/2017: Hemoglobin 15.7; Platelets 289 02/11/2018: ALT 22; BUN 15; Creatinine, Ser 0.66; Potassium 4.1; Sodium 139    Lipid Panel    Component Value Date/Time   CHOL 162 02/11/2018 1411   TRIG 142.0 02/11/2018 1411   HDL 61.20 02/11/2018 1411   CHOLHDL 3 02/11/2018 1411   VLDL 28.4 02/11/2018 1411   LDLCALC 72 02/11/2018 1411      Wt Readings from Last 3 Encounters:  05/30/18 186 lb 3.2 oz (84.5 kg)  05/26/18 186 lb 9.6 oz (84.6 kg)  02/13/18 180 lb (81.6 kg)      Other studies Reviewed: Additional  studies/ records that were reviewed today include: Echo.. Review of the above records demonstrates:  Please see elsewhere in the note.     ASSESSMENT AND PLAN:  PREOP:    The patient has no high risk findings.  She has a high functional level.  No further cardiovascular testing is suggested.  The patient is not going for high risk surgery from a cardiovascular standpoint.  According to ACC/AHA guidelines the patient is at acceptable risk for the planned surgery without further testing.  CVA:    She reports some residual right-sided numbness.  I did review records  and cannot find a clear etiology for this.  She had a thorough work-up.  Dr. Percival Spanish knows      Current medicines are reviewed at length with the patient today.  The patient does not have concerns regarding medicines.  The following changes have been made:  no change  Labs/ tests ordered today include:   Orders Placed This Encounter  Procedures  . EKG 12-Lead     Disposition:   FU with me as needed.     Signed, Minus Breeding, MD  05/30/2018 4:45 PM    [facility]

## 2018-05-30 ENCOUNTER — Ambulatory Visit: Payer: Medicare Other | Admitting: Cardiology

## 2018-05-30 ENCOUNTER — Encounter: Payer: Self-pay | Admitting: Cardiology

## 2018-05-30 ENCOUNTER — Other Ambulatory Visit: Payer: Self-pay

## 2018-05-30 VITALS — BP 135/89 | HR 84 | Ht 61.0 in | Wt 186.2 lb

## 2018-05-30 DIAGNOSIS — I639 Cerebral infarction, unspecified: Secondary | ICD-10-CM | POA: Diagnosis not present

## 2018-05-30 DIAGNOSIS — Z01818 Encounter for other preprocedural examination: Secondary | ICD-10-CM

## 2018-05-30 NOTE — Patient Instructions (Signed)
Medication Instructions:  Continue current medications  If you need a refill on your cardiac medications before your next appointment, please call your pharmacy.  Labwork: None ordered   Testing/Procedures: None Ordered  Follow-Up: . Your physician recommends that you schedule a follow-up appointment in: As Needed   At [facility], you and your health needs are our priority.  As part of our continuing mission to provide you with exceptional heart care, we have created designated Provider Care Teams.  These Care Teams include your primary Cardiologist (physician) and Advanced Practice Providers (APPs -  Physician Assistants and Nurse Practitioners) who all work together to provide you with the care you need, when you need it.  Thank you for choosing [facility] at [facility]!!

## 2018-06-05 ENCOUNTER — Telehealth: Payer: Self-pay

## 2018-06-05 ENCOUNTER — Other Ambulatory Visit: Payer: Self-pay | Admitting: Neurology

## 2018-06-05 ENCOUNTER — Telehealth: Payer: Self-pay | Admitting: Cardiology

## 2018-06-05 NOTE — Telephone Encounter (Signed)
I believe that these were already filled out and faxed

## 2018-06-05 NOTE — Telephone Encounter (Signed)
Copied from [place] [id number]. Topic: General - Other >> Jun 03, 2018  5:03 PM Rutherford Nail, NT wrote: Reason for CRM: Patient calling and states that Dr Sharlet Salina told her that she did not need to see her in the office, but to let her know when the EKG was in MyChart for review. States that she is needing her clearance/approval to get her hip surgery. Please advise.  Wells Guiles, surgery coordinator for Dr Rhona [phone]  CB#: [phone]

## 2018-06-05 NOTE — Telephone Encounter (Signed)
° °  [facility] Pre-operative Risk Assessment    Request for surgical clearance:  1. What type of surgery is being performed?  A left Hip Replacement   2. When is this surgery scheduled?  07-29-18   3. What type of clearance is required (medical clearance vs. Pharmacy clearance to hold med vs. Both)? Medical  4. Are there any medications that need to be held prior to surgery and how long   5. Practice name and name of physician performing surgery? Dr Melrose Nakayama    6. What is your office phone number [phone]    7.   What is your office fax number [phone]  8.   Anesthesia type (None, local, MAC, general) ? Spinal  Toni Hicks 06/05/2018, 10:53 AM  _________________________________________________________________   (provider comments below)

## 2018-06-05 NOTE — Telephone Encounter (Signed)
Noted thanks °

## 2018-06-05 NOTE — Telephone Encounter (Signed)
   Primary Cardiologist: Minus Breeding, MD  Chart reviewed as part of pre-operative protocol coverage. Patient was contacted 06/05/2018 in reference to pre-operative risk assessment for pending surgery as outlined below.  Toni Hicks was last seen on 05/30/18 by Dr. Percival Spanish.    Per Dr. Percival Spanish: The patient has no high risk findings.  She has a high functional level.  No further cardiovascular testing is suggested.  The patient is not going for high risk surgery from a cardiovascular standpoint.  According to ACC/AHA guidelines the patient is at acceptable risk for the planned surgery without further testing.  Therefore, based on ACC/AHA guidelines, the patient would be at acceptable risk for the planned procedure without further cardiovascular testing.   I will route this recommendation to the requesting party via Epic fax function and remove from pre-op pool.  Please call with questions.  Tami Lin Ammie Warrick, PA 06/05/2018, 11:18 AM

## 2018-06-11 ENCOUNTER — Telehealth: Payer: Self-pay | Admitting: Neurology

## 2018-06-11 NOTE — Telephone Encounter (Signed)
Pt is needing a refill on her ALPRAZolam Duanne Moron) 0.5 MG tablet [facility] [address]

## 2018-06-11 NOTE — Telephone Encounter (Signed)
This is not a permanent treatment. She has COPD - the continued use of benzodiazepines is contraindicated.

## 2018-06-11 NOTE — Telephone Encounter (Signed)
Per previous office visit with Dr Brett Fairy it was mentioned the patient used this for insomnia but then plan indicated that Dr Dohmeier would not need to continue refills for the patient. I will address with Dr Brett Fairy and see if she would like to provide the refill for the patient.

## 2018-06-12 ENCOUNTER — Other Ambulatory Visit: Payer: Self-pay | Admitting: Internal Medicine

## 2018-06-12 ENCOUNTER — Other Ambulatory Visit: Payer: Self-pay | Admitting: Family Medicine

## 2018-06-12 ENCOUNTER — Encounter: Payer: Self-pay | Admitting: Neurology

## 2018-06-12 ENCOUNTER — Telehealth: Payer: Self-pay | Admitting: *Deleted

## 2018-06-12 MED ORDER — NORTRIPTYLINE HCL 25 MG PO CAPS: 25.0000 mg | ORAL_CAPSULE | Freq: Every day | ORAL | 1 refills | Status: DC

## 2018-06-12 MED ORDER — ALPRAZOLAM 0.5 MG PO TABS: 0.2500 mg | ORAL_TABLET | Freq: Every evening | ORAL | 0 refills | Status: DC | PRN

## 2018-06-12 NOTE — Telephone Encounter (Signed)
Pt called stating that she tried taking the gabapentin but it has caused her to have gas/diarrhea every night that she takes it. Pt states that she is still unable to sleep more than 2-3 hours a night. Is there anything else that she can take to get her by until surgery?

## 2018-06-12 NOTE — Telephone Encounter (Signed)
   Cc Dr Sharlet Salina  Dear Mrs Calvillo,  Chronic insomnia is not treated with medication , the medications will cause side effects in memory and balance, sometimes early dementia has been attributed to the long term use.  The diagnosis of COPD is part of your medical chart- please discuss with PCP, Dr Sharlet Salina.  I will provide 30 days of Xanax to give you and Dr Sharlet Salina time to refer to anxiety treatment/ CBT. Cognitive Behavior therapist are part of the [place] group.

## 2018-06-12 NOTE — Telephone Encounter (Signed)
lmovm letting pt know rx has been sent to pharmacy.

## 2018-06-12 NOTE — Telephone Encounter (Signed)
We will try nortriptyline.  Discontinue the gabapentin prescription sent in already

## 2018-06-12 NOTE — Telephone Encounter (Signed)
Toni Hicks  Sent: 06/12/2018  9:36 AM EDT  To: Darleen Crocker, RN  Subject: [id number]                    Hello and no not at all helpful. I have not been told I have COPD. What I hAve is anxiety that limits my sleep. Other medications have been tried, side effects too great and not totally effective. I have hip replacement surgery and have been told sleep is most important. I slept 2 hours last nite. Usually 4-5 hours but not enough to heal. The therapist wasn't really helpful and didn't keep our last appointment. It's been well over a year now we have been working on my sleep. The CPAP gives me an extra hour or so. I am at a complete loss. The Berenice Primas is the only meds to help without so many side effects. I believe you are an amazing and dedicated Dr. but I have not gotten the help I do desperately need. Please help me find my health, sleep is the key.  ----- Message -----  From: Nurse Payton Spark  Sent: 06/12/2018 8:42 AM EDT  To: Toni Hicks  Subject: medication  Hey [name],  [place] you are doing well! I did receive your message in reference to refilling the xanax. I was reviewing the previous office visit with Dr Brett Fairy and she had mentioned that this was initially ordered for the sleep study and to get you used to using the CPAP. She discussed that this would not be a medication to continue using and given the history with COPD the continued use of benzodiazepines (xanax) is contraindicated.   She had mentioned that you were established with a psychiatrist that helps with anxiety, I would defer discussing with psychiatrist about what is the safest option for you!  Im sorry but hope this is helpful in explaining why the refill has not been sent in.   Thanks  [employer]

## 2018-06-13 NOTE — Telephone Encounter (Signed)
Does she have a skin problem? This is not really supposed to be used long term due to the high potency of the medicine.

## 2018-06-14 ENCOUNTER — Other Ambulatory Visit: Payer: Self-pay | Admitting: Allergy

## 2018-06-25 ENCOUNTER — Telehealth: Payer: Self-pay | Admitting: Internal Medicine

## 2018-06-25 NOTE — Telephone Encounter (Signed)
PT Is calling and would like a refill on fluocinonide cream . Pt uses the cream when she breaks out from rash, allergies .  [facility] [address]

## 2018-06-26 ENCOUNTER — Other Ambulatory Visit: Payer: Self-pay | Admitting: Orthopaedic Surgery

## 2018-06-26 MED ORDER — TRIAMCINOLONE ACETONIDE 0.1 % EX CREA: 1.0000 "application " | TOPICAL_CREAM | Freq: Two times a day (BID) | CUTANEOUS | 1 refills | Status: DC

## 2018-06-26 NOTE — Telephone Encounter (Signed)
Okay to refill? 

## 2018-06-26 NOTE — Telephone Encounter (Signed)
I have sent in a different cream as that one is very potent and should not be used unless needed. This one is milder and would be okay to use prn.

## 2018-07-06 NOTE — Progress Notes (Signed)
[name] [address] [place], [place] [address] Phone: [phone] Subjective:   Toni Hicks, am serving as a scribe for Dr. Hulan Saas.  I'm seeing this patient by the request  of:    CC: Right knee pain  [id number]  Toni Hicks is a 68 y.o. female coming in with complaint of right knee pain. Patient has had increase in knee pain. Is suppose to have left hip surgery in one month. Feels swelling in knee. Pain is intense but intermittent. Pain can occur at night and when sitting. Has been using topical cream to reduce pain. Pain did get bad enough that she had to use a walker. Pain occurs on medial aspect of knee.   Was using gabapentin but had gastro issues from being on it. Has discontinued medication. Is not using nortriptyline and states that this medication had side effects that she did not like.      Past Medical History:  Diagnosis Date  . ADD (attention deficit disorder)   . Adult acne   . Alcohol abuse   . Allergic rhinitis   . Anxiety and depression   . Asthma   . Congenital pneumonia   . Eczema   . Hypoglycemia    Hicks longer per pt   . Hypothyroidism   . Impaired fasting blood sugar   . Mycoplasma pneumonia   . Restless leg syndrome   . Seasonal allergies   . Stroke ([place])   . Urticaria    Past Surgical History:  Procedure Laterality Date  . DILATION AND CURETTAGE OF UTERUS     @ 68 years old  . WISDOM TOOTH EXTRACTION     Social History   Socioeconomic History  . Marital status: Widowed    Spouse name: has boyfriend Sam  . Number of children: 2  . Years of education: 51  . Highest education level: Some college, Hicks degree  Occupational History  . Occupation: Clinical biochemist    Comment: Semi-retired  Social Needs  . Financial resource strain: Not on file  . Food insecurity    Worry: Not on file    Inability: Not on file  . Transportation needs    Medical: Not on file    Non-medical: Not on file   Tobacco Use  . Smoking status: Former Smoker    Packs/day: 2.00    Years: 15.00    Pack years: 30.00    Types: Cigarettes    Quit date: 1980    Years since quitting: 40.5  . Smokeless tobacco: Never Used  Substance and Sexual Activity  . Alcohol use: Yes    Alcohol/week: 0.0 standard drinks    Comment: 3-5 glasses of wine day   . Drug use: Hicks  . Sexual activity: Not on file  Lifestyle  . Physical activity    Days per week: Not on file    Minutes per session: Not on file  . Stress: Not on file  Relationships  . Social Herbalist on phone: Not on file    Gets together: Not on file    Attends religious service: Not on file    Active member of club or organization: Not on file    Attends meetings of clubs or organizations: Not on file    Relationship status: Not on file  Other Topics Concern  . Not on file  Social History Narrative   HS[employer], [employer] MA- photography   Married '  72- 10 years divorced, married '86- 66 years divorced, Married '96- 65 years- widowed.   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 Other     Can not eat carbohydrates without protein, is allergic to certain foods but can take in certain doses   Family History  Problem Relation Age of Onset  . COPD Father   . Heart failure Father   . Coronary artery disease Father   . Cancer Mother        breast  . Hypertension Mother   . Dementia Mother   . Stroke Mother   . Breast cancer Mother      Current Outpatient Medications (Cardiovascular):  .  amLODipine (NORVASC) 5 MG tablet, Take 1 tablet (5 mg total) by mouth daily.  Current Outpatient Medications (Respiratory):  .  albuterol (PROVENTIL HFA;VENTOLIN HFA) 108 (90 Base) MCG/ACT inhaler, INHALE 2 PUFFS EVERY 6 HOURS AS NEEDED FOR WHEEZING OR SHORTNESS OF BREATH .  azelastine (ASTELIN) 0.1 % nasal spray, Place 2 sprays into both  nostrils 2 (two) times daily. .  Fluticasone Propionate (XHANCE) 93 MCG/ACT EXHU, Place 2 sprays into the nose 2 (two) times daily. .  montelukast (SINGULAIR) 10 MG tablet, Take 1 tablet (10 mg total) by mouth at bedtime. [place]uts INHUB 250-50 MCG/DOSE AEPB, INHALE 1 PUFF INTO THE LUNGS 2 (TWO) TIMES DAILY. NEED ANNUAL APPOINTMENT FOR FURTHER REFILLS  Current Outpatient Medications (Analgesics):  .  meloxicam (MOBIC) 15 MG tablet, TAKE 1 TABLET BY MOUTH EVERY DAY   Current Outpatient Medications (Other):  [name] Kitchen  ALPRAZolam (XANAX) 0.5 MG tablet, Take 0.5-1 tablets (0.25-0.5 mg total) by mouth at bedtime as needed for anxiety. [name] Kitchen  b complex vitamins tablet, Take 1 tablet by mouth as needed.   .  nortriptyline (PAMELOR) 25 MG capsule, Take 1 capsule (25 mg total) by mouth at bedtime. .  tolterodine (DETROL LA) 4 MG 24 hr capsule, Take 1 capsule (4 mg total) by mouth daily. [name] Kitchen  triamcinolone cream (KENALOG) 0.1 %, Apply 1 application topically 2 (two) times daily.    Past medical history, social, surgical and family history all reviewed in electronic medical record.  Hicks pertanent information unless stated regarding to the chief complaint.   Review of Systems:  Hicks headache, visual changes, nausea, vomiting, diarrhea, constipation, dizziness, abdominal pain, skin rash, fevers, chills, night sweats, weight loss, swollen lymph nodes, body aches, joint swelling, chest pain, shortness of breath, mood changes.  Positive muscle aches  Objective  Blood pressure 110/72, pulse 84, height 5\' 1"  (1.549 m), SpO2 95 %.   General: Hicks apparent distress alert and oriented x3 mood and affect normal, dressed appropriately.  HEENT: Pupils equal, extraocular movements intact  Respiratory: Patient's speak in full sentences and does not appear short of breath  Cardiovascular: Trace lower extremity edema, non tender, Hicks erythema  Skin: Warm dry intact with Hicks signs of infection or rash on extremities or on axial  skeleton.  Abdomen: Soft nontender  Neuro: Cranial nerves II through XII are intact, neurovascularly intact in all extremities with 2+ DTRs and 2+ pulses.  Lymph: Hicks lymphadenopathy of posterior or anterior cervical chain or axillae bilaterally.  Gait severely antalgic walking with the aid of a cane MSK:  Non tender with full range of motion and good stability and symmetric strength and tone of shoulders, elbows, wrist, and ankles bilaterally.  Knee: Right Patient  does have some arthritic changes noted on inspection.  Patient does have a positive grind.  Trace effusion noted. Mild instability with valgus and varus force. Neurovascular intact distally.  Patient's left knee also has some arthritic changes.  Did not do full range of motion due to the irritation into his left hip.  After informed written and verbal consent, patient was seated on exam table. Right knee was prepped with alcohol swab and utilizing anterolateral approach, patient's right knee space was injected with 4:1  marcaine 0.5%: Kenalog 40mg /dL. Patient tolerated the procedure well without immediate complications..   Impression and Recommendations:     This case required medical decision making of moderate complexity. The above documentation has been reviewed and is accurate and complete Lyndal Pulley, DO       Note: This dictation was prepared with Dragon dictation along with smaller phrase technology. Any transcriptional errors that result from this process are unintentional.

## 2018-07-07 ENCOUNTER — Encounter: Payer: Self-pay | Admitting: Family Medicine

## 2018-07-07 ENCOUNTER — Other Ambulatory Visit: Payer: Self-pay

## 2018-07-07 ENCOUNTER — Ambulatory Visit (INDEPENDENT_AMBULATORY_CARE_PROVIDER_SITE_OTHER): Payer: Medicare Other | Admitting: Family Medicine

## 2018-07-07 VITALS — BP 110/72 | HR 84 | Ht 61.0 in

## 2018-07-07 DIAGNOSIS — M1711 Unilateral primary osteoarthritis, right knee: Secondary | ICD-10-CM | POA: Diagnosis not present

## 2018-07-07 DIAGNOSIS — M1612 Unilateral primary osteoarthritis, left hip: Secondary | ICD-10-CM | POA: Diagnosis not present

## 2018-07-07 NOTE — Assessment & Plan Note (Addendum)
Patient given injection today and tolerated the procedure well.  We discussed icing regimen and home exercise.  Discussed the patient could do viscosupplementation if having worsening discomfort.  I do believe that patient himself unfortunately worsening of the left hip and needs to have that replaced which he was scheduled in 1 month.  Patient will see me again 1 month after surgical intervention for the hip

## 2018-07-08 ENCOUNTER — Other Ambulatory Visit: Payer: Self-pay | Admitting: Family Medicine

## 2018-07-09 ENCOUNTER — Other Ambulatory Visit: Payer: Self-pay | Admitting: Family Medicine

## 2018-07-14 ENCOUNTER — Telehealth: Payer: Self-pay | Admitting: Allergy

## 2018-07-14 NOTE — Telephone Encounter (Signed)
Patient is requesting fluocinonide.05% 2 big tubes. She was prescribed this by another doctor, but she said that now that she is seeing Dr. Nelva Bush, could she call this in for her. [facility] [address]

## 2018-07-15 MED ORDER — FLUOCINONIDE 0.05 % EX CREA: 1.0000 "application " | TOPICAL_CREAM | Freq: Two times a day (BID) | CUTANEOUS | 0 refills | Status: DC | PRN

## 2018-07-15 NOTE — Telephone Encounter (Signed)
Pt PCP is not wanting to refill that med for her, she wants her to try something else. Pt is planning to change PCPs. She scheduled an apt with Dr. Nelva Bush next week for her eczema issues that she has behind her knees and on hands- it is a itchy rash that she gets and has had since a child. This cream works very well for her and does not want to try anything else- when she uses it her rash is gone by day 3.

## 2018-07-15 NOTE — Telephone Encounter (Signed)
What is she using this for?  We have not discussed any skin related issues at previous visits.  Thus would not prescribe until we have discussed this at visit as to need for continued use.    She should have whoever prescribes this for her to refill at this time.

## 2018-07-15 NOTE — Telephone Encounter (Signed)
Ok thanks.    That is fine to refill with no refills until she is seen in clinic.

## 2018-07-15 NOTE — Telephone Encounter (Signed)
Left message for pt to return my call.

## 2018-07-15 NOTE — Addendum Note (Signed)
Addended by: Katherina Right D on: 07/15/2018 11:43 AM   Modules accepted: Orders

## 2018-07-15 NOTE — Telephone Encounter (Signed)
Pt informed 1 refill sent

## 2018-07-22 ENCOUNTER — Other Ambulatory Visit: Payer: Self-pay

## 2018-07-22 ENCOUNTER — Ambulatory Visit (INDEPENDENT_AMBULATORY_CARE_PROVIDER_SITE_OTHER): Payer: Medicare Other | Admitting: Neurology

## 2018-07-22 ENCOUNTER — Encounter: Payer: Self-pay | Admitting: Neurology

## 2018-07-22 VITALS — BP 146/82 | HR 70 | Temp 98.0°F | Ht 61.0 in | Wt 189.0 lb

## 2018-07-22 DIAGNOSIS — Z9989 Dependence on other enabling machines and devices: Secondary | ICD-10-CM | POA: Diagnosis not present

## 2018-07-22 DIAGNOSIS — G4733 Obstructive sleep apnea (adult) (pediatric): Secondary | ICD-10-CM | POA: Diagnosis not present

## 2018-07-22 DIAGNOSIS — F5104 Psychophysiologic insomnia: Secondary | ICD-10-CM

## 2018-07-22 MED ORDER — TRAZODONE HCL 50 MG PO TABS: 50.0000 mg | ORAL_TABLET | Freq: Every day | ORAL | 3 refills | Status: DC

## 2018-07-22 NOTE — Patient Instructions (Signed)
Insomnia  Insomnia is a sleep disorder that makes it difficult to fall asleep or stay asleep. Insomnia can cause fatigue, low energy, difficulty concentrating, mood swings, and poor performance at work or school. There are three different ways to classify insomnia:  Difficulty falling asleep.  Difficulty staying asleep.  Waking up too early in the morning. Any type of insomnia can be long-term (chronic) or short-term (acute). Both are common. Short-term insomnia usually lasts for three months or less. Chronic insomnia occurs at least three times a week for longer than three months. What are the causes? Insomnia may be caused by another condition, situation, or substance, such as:  Anxiety.  Certain medicines.  Gastroesophageal reflux disease (GERD) or other gastrointestinal conditions.  Asthma or other breathing conditions.  Restless legs syndrome, sleep apnea, or other sleep disorders.  Chronic pain.  Menopause.  Stroke.  Abuse of alcohol, tobacco, or illegal drugs.  Mental health conditions, such as depression.  Caffeine.  Neurological disorders, such as Alzheimer's disease.  An overactive thyroid (hyperthyroidism). Sometimes, the cause of insomnia may not be known. What increases the risk? Risk factors for insomnia include:  Gender. Women are affected more often than men.  Age. Insomnia is more common as you get older.  Stress.  Lack of exercise.  Irregular work schedule or working night shifts.  Traveling between different time zones.  Certain medical and mental health conditions. What are the signs or symptoms? If you have insomnia, the main symptom is having trouble falling asleep or having trouble staying asleep. This may lead to other symptoms, such as:  Feeling fatigued or having low energy.  Feeling nervous about going to sleep.  Not feeling rested in the morning.  Having trouble concentrating.  Feeling irritable, anxious, or  depressed. How is this diagnosed? This condition may be diagnosed based on:  Your symptoms and medical history. Your health care provider may ask about: ? Your sleep habits. ? Any medical conditions you have. ? Your mental health.  A physical exam. How is this treated? Treatment for insomnia depends on the cause. Treatment may focus on treating an underlying condition that is causing insomnia. Treatment may also include:  Medicines to help you sleep.  Counseling or therapy.  Lifestyle adjustments to help you sleep better. Follow these instructions at home: Eating and drinking   Limit or avoid alcohol, caffeinated beverages, and cigarettes, especially close to bedtime. These can disrupt your sleep.  Do not eat a large meal or eat spicy foods right before bedtime. This can lead to digestive discomfort that can make it hard for you to sleep. Sleep habits   Keep a sleep diary to help you and your health care provider figure out what could be causing your insomnia. Write down: ? When you sleep. ? When you wake up during the night. ? How well you sleep. ? How rested you feel the next day. ? Any side effects of medicines you are taking. ? What you eat and drink.  Make your bedroom a dark, comfortable place where it is easy to fall asleep. ? Put up shades or blackout curtains to block light from outside. ? Use a white noise machine to block noise. ? Keep the temperature cool.  Limit screen use before bedtime. This includes: ? Watching TV. ? Using your smartphone, tablet, or computer.  Stick to a routine that includes going to bed and waking up at the same times every day and night. This can help you fall asleep faster.  Consider making a quiet activity, such as reading, part of your nighttime routine.  Try to avoid taking naps during the day so that you sleep better at night.  Get out of bed if you are still awake after 15 minutes of trying to sleep. Keep the lights down, but  try reading or doing a quiet activity. When you feel sleepy, go back to bed. General instructions  Take over-the-counter and prescription medicines only as told by your health care provider.  Exercise regularly, as told by your health care provider. Avoid exercise starting several hours before bedtime.  Use relaxation techniques to manage stress. Ask your health care provider to suggest some techniques that may work well for you. These may include: ? Breathing exercises. ? Routines to release muscle tension. ? Visualizing peaceful scenes.  Make sure that you drive carefully. Avoid driving if you feel very sleepy.  Keep all follow-up visits as told by your health care provider. This is important. Contact a health care provider if:  You are tired throughout the day.  You have trouble in your daily routine due to sleepiness.  You continue to have sleep problems, or your sleep problems get worse. Get help right away if:  You have serious thoughts about hurting yourself or someone else. If you ever feel like you may hurt yourself or others, or have thoughts about taking your own life, get help right away. You can go to your nearest emergency department or call:  Your local emergency services (911 in the [place]  A suicide crisis helpline, such as the [facility] at (806)792-3599. This is open 24 hours a day. Summary  Insomnia is a sleep disorder that makes it difficult to fall asleep or stay asleep.  Insomnia can be long-term (chronic) or short-term (acute).  Treatment for insomnia depends on the cause. Treatment may focus on treating an underlying condition that is causing insomnia.  Keep a sleep diary to help you and your health care provider figure out what could be causing your insomnia. This information is not intended to replace advice given to you by your health care provider. Make sure you discuss any questions you have with your health care  provider. Document Released: 12/23/1999 Document Revised: 12/07/2016 Document Reviewed: 10/04/2016 Elsevier Patient Education  2020 [employer].   Somatic Symptom Disorder Somatic symptom disorder is a disorder with physical (somatic) symptoms that cause distress or affect daily living. However, no other medical condition can be found to explain these symptoms. Somatic symptom disorder may interfere with relationships, work, school, or other daily activities. It may lead to frequent medical visits and many medical tests to try to find the cause of symptoms. It may also lead to surgical procedures that do not help and can cause serious problems. People with somatic symptom disorder are also at risk for alcohol or drug addiction, suicide attempts, and divorce. Somatic symptom disorder may start in childhood but is most common in young adults. The disorder may be triggered by stressful life events. It may last for several years or may come and go throughout life. What are the causes? The exact cause of this condition is often unknown. What increases the risk? The following factors may make you more likely to develop this condition:  Being female. The disorder is more common in females than males.  Having a history of childhood abuse.  Having a history of alcohol or substance abuse.  Having family members with the disorder.  Having other mental  health conditions, including personality disorders. What are the signs or symptoms? You may have somatic symptoms that cannot be explained by a medical condition. Examples of somatic symptoms may include:  Pain. This may be the only physical symptom. Pain may involve any part of the body.  Stomach or intestinal symptoms. These may include nausea, indigestion, diarrhea, or abdominal pain.  Other non-specific symptoms such as fatigue, trouble sleeping, headaches, or dizziness. How is this diagnosed? You may be diagnosed with this condition  if:  You have one or more somatic symptoms that cause you distress or affect your daily living.  You react to the somatic symptoms in a way that is out of proportion to the symptoms. The reaction may include: ? Thinking all the time about the severity of the symptoms. ? Feeling very anxious all the time about the symptoms or your general health. ? Spending a lot of time and energy dealing with the symptoms or health concerns.  You have somatic symptoms either continuously or on and off for at least 6 months. Exams and tests will be done to rule out serious physical health problems. After that, your health care provider may refer you to a mental health specialist for psychological evaluation. Somatic symptoms can be related to a number of mental health conditions. How is this treated? This condition is treated with one or more of the following:  Regular follow-up visits with your health care provider for evaluation and reassurance.  Counseling or talk therapy. This involves working with a mental health specialist to: ? Help you understand what triggers your symptoms. ? Help you learn some coping skills to deal with your symptoms.  Medicine. Certain medicines can help with severe anxiety or depression caused by this disorder.  Healthy lifestyle. A balanced diet and regular exercise can reduce stress and somatic symptoms. Follow these instructions at home: Medicines  Take over-the-counter and prescription medicines only as told by your health care provider. Eating and drinking  Eat a healthy diet that includes plenty of vegetables, fruits, whole grains, low-fat dairy products, and lean protein. Do not eat a lot of foods that are high in solid fats, added sugars, or salt. General instructions  Do not use any products that contain nicotine or tobacco, such as cigarettes and e-cigarettes. If you need help quitting, ask your health care provider.  Do not use illegal drugs. Do not misuse  prescription medicines.  Do not drink alcohol if your health care provider tells you not to.  Get regular exercise. Most adults should exercise for at least 150 minutes each week. Check with your health care provider before starting an exercise program.  Get the right amount and quality of sleep. Most adults need 7-9 hours of sleep each night.  Keep all follow-up visits as told by your health care provider. This is important. Contact a health care provider if:  Your pain or symptoms do not go away or they become severe.  You develop new symptoms. Get help right away if:  You have serious thoughts about hurting yourself or someone else. If you ever feel like you may hurt yourself or others, or have thoughts about taking your own life, get help right away. You can go to your nearest emergency department or call:  Your local emergency services (911 in the [place]  A suicide crisis helpline, such as the [facility] at (639) 706-1818. This is open 24 hours a day. Summary  People with somatic symptom disorder have physical symptoms  that cause distress or affect daily living. However, no other medical condition can be found to explain these symptoms.  Somatic symptom disorder may interfere with relationships, work, school, or other daily activities. It may lead to frequent medical visits and many medical tests to try to find the cause of symptoms.  Exams and tests will be done to rule out serious physical health problems. After that, your health care provider may refer you to a mental health specialist for psychological evaluation. This information is not intended to replace advice given to you by your health care provider. Make sure you discuss any questions you have with your health care provider. Document Released: 01/27/2010 Document Revised: 02/05/2017 Document Reviewed: 02/05/2017 Elsevier Patient Education  2020 [employer].

## 2018-07-22 NOTE — Progress Notes (Signed)
SLEEP MEDICINE CLINIC   Provider:  Larey Seat, M D  Primary Care Physician:  Hoyt Koch, MD   Referring Provider:  Sarina Ill, MD and Marcial Pacas, MD at [facility]      HPI:  Toni Hicks is a 68 y.o. female , interval history on 07-22-2018,  The patient endorsed 10/ 24 points on her ESS, Geriatric  Depression score - 3/ 15.    See has intermittently to accept her CPAP machine also sleeps better with it, she likes to keep the covers of her bed over her face which allows her to sleep even when the bedroom is not fully dog.  Her compliance report looks excellent 100% compliance for the last 30 days with the final date of 20 July 2018.  Average of 7 hours 24 minutes, CPAP is set at 9 cmH2O pressure was 1 cm expiratory pressure today.  AHI is 0.6/h.  No central apneas emerging at this time.  Moderate air leakage. She still goes to the  bathroom every 1-2 hours at night. She still uses a fit bit-  She is approaching 6 hours of nightly sleep, but still reports insomnia.  She is having a hip surgery next week. Gabapentin was prescribed by her orthopedist and she feels " it messes her up". She had seen cognitive behaviour therapy- 2 times a month with [place]. She is wanting to continue on zoom.     She was last seen on 09-23-2017 in a RV.  She was seen here as in a referral after she suffered a stroke, and has seen Dr Krista Blue and later Dr. Jaynee Eagles, who iniated this referral. Toni Hicks had suffered a stroke but not within the last 6 months.  She has originally seen pulmonologist Dr. Annamaria Boots, and then most recently Dr. Sarina Ill.    RV 01-21-2018,  I will today reviewed the changing sleep habits with Mrs. Toni Hicks. [place], meanwhile 68 year old Caucasian female who has stated that she has improved her sleep hygiene.  She soaks in a jacuzzi before she goes to bed, And she will be able to initiate sleep but she still wakes up earlier when she desires and sometimes nocturnal sleep may end at  1-2 AM more. She feels anxious when awake, was asked by her therapist read and do boring things. She has adjusted to CPAP and she feels her sleep improved, her AHi is 1.6, using it for 5 hours on average. 9 cm water with 1 cm EPR. 77% compliance by time, 93% by days. She sleeps well in a king size bed.    I had the pleasure of seeing Toni Hicks here today on 16 September in a revisit after she underwent a split-night polysomnography. This took place on 08 April 2017, referral by Dr. Sarina Ill.  She had very mild REM dependent obstructive sleep apnea with an AHI under 11/h but during REM sleep her apnea accentuated to 63/h of sleep.   Hypoxemia was borderline there were just under 30 minutes of total time and hypoxia measured, but it amounted to almost 20% given her brief sleep time overall.  We had recommended changes in sleep hygiene which were discussed at the last visit with Toni Hicks, and a follow-up for an attended CPAP titration whole night.  Also discussed with the avoidance of caffeine-containing beverages, alcohol and nicotine, and sleep psychology-cognitive behavioral referral.  The CPAP titration was then repeated on 14 May 2017 and begun at 5 cm water pressure which was step-by-step  increased to 9 cm water pressure.  I met 9 cm CPAP there was no longer any apnea noted, she used a DreamWear nasal pillow and small size.  There was no REM sleep rebounding at 21.6% of the total sleep architecture, EKG was a normal sinus rhythm, there were some periodic limb movements and some of them woke the patient.  This time at 7.6/h.  I ordered an auto titration capable device.  CPAP compliance was performed 80% on 24 out of 30 days,  but only 11 of these 24 days over 4 hours.   Average use of time currently is 2 hours 48 minutes which may be partially due to the patient's tendency to have upper airway infections and congestion.  CPAP is an auto titratable device but is set at 9 cmH2O pressure was 1 cm  EPR, residual AHI was 4.6 which leads me to believe that the actually need to increase the pressure.  She did have some moderate air leaks, these may have been more related to displacing the mask and actually having trouble with oral Ventolin.  There were no central apneas emerging.  The patient states that she has gained about 1 hour of nocturnal sleep by using CPAP, but she has not had the desired effect on nocturia.  For this reason nurse practitioner [name] had prescribed her Detrol after Myrbetriq had no effect and was very expensive.   She has not tried the [employer] yet, she also has not tried the trazodone yet which is a non-addictive medication that can help initiating and maintaining sleep.   The patient states that she had severe psychiatric side effects just from using Nasacort ( Nasonex in the past) "once a week " and became actually suicidal.  Given this I would think that she is exquisitely sensitive to steroids, prednisone while she can tolerate marihuana and alcohol. She would like to have an ENT opnion on her sinusitis.  She is using a fit bit - and that documented about 6 hours of sleep.    She carries a diagnosis of allergic rhinitis, asthma, history of alcohol abuse, chronic pneumonia or recurrent pneumonias.  Impaired fasting blood sugar, mycoplasma pneumonia, restless legs and hypothyroidism.   The patient is currently using albuterol as needed, amlodipine, Advair, baby aspirin daily, Myrbetriq at night,  and has been ordered an AeroChamber plus inhaler ( not received yet ) . She is drinking alcohol daily and smokes marihuana, and is clearly not willing to change that, but is frustrated about the results of CPAP therapy and gets 6 hours of sleep-  and requests Xanax.  She is here because in her last visit with [name] she was prescribed Detrol, and Trazodone- and she looked up all side effects and was told by friends not to even consider taking the mediations. She has not used it,  not tried it, she asked to discuss this with me, not the NP.          Consult: Chief complaint according to patient : "I cannot get enough sleep- anxiety " Sleep habits are as follows: The patient usually watches television or is reading in her living room before she retreats to her bedroom.  Her significant other and her family have known that she falls asleep while watching TV, and snoring. The patient presented with excessive daytime sleepiness reflected and an Epworth sleepiness score of 18 points. There was a report of a dry mouth in the morning witnessed l ud snoring and witnessed apneas per  spouse. The patient's regular sleep routine is as follows; the patient normally goes to bed around 11 and falls asleep within 10-15 minutes he is awoken 3 times at night and nocturia and is woken by his alarm clock at 6 in the morning. 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   She is " reading" on an electronic device- and does play on face book, on her cell phone.  She retreats to bed at 10.30- 11.30 Pm, following her boyfriend by an hour.  They don't sleep in the same room.  They hear one another snoring. She sleeps for 2 hours through and wakes up for nocturia, and sometimes by her own snoring. She gets up at 7. 30 and has breakfast, returns to bed - tries again and again to get extra sleep. She has 3-5 nocturias.  She naps every day. She sleeps in increments amounting to 8-12 hours per 24 hours. There is no structure. She is entangled in her sheets, restlessly sleeping. She kicks a lot.    Sleep medical history and family sleep history: deminished lung capacity- almost yearly pneumonia, frequent bronchitis, COPD ?, apnea and snoring, RLS, audible breathing, frequent breathing.  JALAILA CARADONNA is a 68 y.o. female here as a referral from Dr. Sharlet Salina , and was seen by Sarina Ill. MD at [facility] on [phone] "for review of MRI of the brain". Patient here today with daughter who provides  much information. She is noncompliant with medication and has stopped taking her aspirin and not taking recommended meds such as statin for HDL. Smoked for 20 years, diabetes under control now but was not at one time, smokes marijuana, long history of alcohol use now maybe a bottle a day. She has moments of complete confusion. She has memory loss.Short term memory is worsening. She has untreated depression and anxiety. She snores and wakes herself up snoring, she struggles to go back to sleep, she snorts, breathing irregularities when sleeping. She has irregular breathing, "snorting, stopping, escalating noise" Spent extended time with patient and daughter discussing her moderately advanced microvascular ischemic changes in the brain which puts her at risk for increased strokes, her previous stroke due to small-vessel disease, her non-complaince with medication is increasing her risk for another stroke and multiple other health problems"  Quoted from Dr Jaynee Eagles.    Social history:  Worked as a Chief Operating Officer, delivered newspapers, irregular hours late night , early mornings. Her daughter is here with her. Lives with her boyfriend.  Used alcohol to excess, marihuana. Smoker with COPD- not asthma. She has quit alcohol, she lives with her boyfriend of 2 decades. She quit drinking, she quit smoking, she sleeps better on CPAP now) 01-21-2018).     Review of Systems: Out of a complete 14 system review, the patient complains of only the following symptoms, and all other reviewed systems are negative.  sinusitis,  Wheezing, insomnia. RLS  Epworth score 8 with naps ! , Fatigue severity score 42   , depression score - she has not endorsed any.    Social History   Socioeconomic History   Marital status: Widowed    Spouse name: has boyfriend Sam   Number of children: 2   Years of education: 17   Highest education level: Some college, no degree  Occupational History   Occupation: Clinical biochemist     Comment: Semi-retired  Scientist, product/process development strain: Not on file   Food insecurity    Worry:  Not on file    Inability: Not on file   Transportation needs    Medical: Not on file    Non-medical: Not on file  Tobacco Use   Smoking status: Former Smoker    Packs/day: 2.00    Years: 15.00    Pack years: 30.00    Types: Cigarettes    Quit date: 1980    Years since quitting: 40.5   Smokeless tobacco: Never Used  Substance and Sexual Activity   Alcohol use: Yes    Alcohol/week: 0.0 standard drinks    Comment: 3-5 glasses of wine day    Drug use: No   Sexual activity: Not on file  Lifestyle   Physical activity    Days per week: Not on file    Minutes per session: Not on file   Stress: Not on file  Relationships   Social connections    Talks on phone: Not on file    Gets together: Not on file    Attends religious service: Not on file    Active member of club or organization: Not on file    Attends meetings of clubs or organizations: Not on file    Relationship status: Not on file   Intimate partner violence    Fear of current or ex partner: Not on file    Emotionally abused: Not on file    Physically abused: Not on file    Forced sexual activity: Not on file  Other Topics Concern   Not on file  Social History Narrative   HS[employer], [employer] MA- photography   Married '73- 30 years divorced, married '86- 45 years divorced, Married '96- 68 years- widowed.   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    Family History  Problem Relation Age of Onset   COPD Father    Heart failure Father    Coronary artery disease Father    Cancer Mother        breast   Hypertension Mother    Dementia Mother    Stroke Mother    Breast cancer Mother     Past Medical History:  Diagnosis Date   ADD (attention deficit disorder)    Adult acne    Alcohol abuse    Allergic rhinitis     Anxiety and depression    Asthma    Congenital pneumonia    Eczema    Hypoglycemia    no longer per pt    Hypothyroidism    Impaired fasting blood sugar    Mycoplasma pneumonia    Restless leg syndrome    Seasonal allergies    Stroke ([place])    Urticaria     Past Surgical History:  Procedure Laterality Date   DILATION AND CURETTAGE OF UTERUS     @ 68 years old   WISDOM TOOTH EXTRACTION      Current Outpatient Medications  Medication Sig Dispense Refill   acetaminophen (TYLENOL) 500 MG tablet Take 500 mg by mouth 3 (three) times daily.     albuterol (PROVENTIL HFA;VENTOLIN HFA) 108 (90 Base) MCG/ACT inhaler INHALE 2 PUFFS EVERY 6 HOURS AS NEEDED FOR WHEEZING OR SHORTNESS OF BREATH (Patient taking differently: Inhale 2 puffs into the lungs every 6 (six) hours as needed for wheezing or shortness of breath. ) 18 Inhaler 3   ALPRAZolam (XANAX) 0.5 MG tablet Take 0.5-1 tablets (0.25-0.5 mg total) by mouth at bedtime as needed for  anxiety. 45 tablet 0   amLODipine (NORVASC) 5 MG tablet Take 1 tablet (5 mg total) by mouth daily. (Patient taking differently: Take 5 mg by mouth at bedtime. ) 90 tablet 3   b complex vitamins tablet Take 1 tablet by mouth 3 (three) times a week.      fluocinonide cream (LIDEX) 7.51 % Apply 1 application topically 2 (two) times daily as needed. (Patient taking differently: Apply 1 application topically 2 (two) times daily as needed (allergic reactions/skin irritation.). ) 30 g 0   meloxicam (MOBIC) 15 MG tablet TAKE 1 TABLET BY MOUTH EVERY DAY (Patient taking differently: Take 15 mg by mouth daily. ) 30 tablet 0   montelukast (SINGULAIR) 10 MG tablet Take 1 tablet (10 mg total) by mouth at bedtime. (Patient taking differently: Take 10 mg by mouth daily. ) 30 tablet 5   WIXELA INHUB 250-50 MCG/DOSE AEPB INHALE 1 PUFF INTO THE LUNGS 2 (TWO) TIMES DAILY. NEED ANNUAL APPOINTMENT FOR FURTHER REFILLS 180 each 1   No current  facility-administered medications for this visit.     Allergies as of 07/22/2018 - Review Complete 07/22/2018  Allergen Reaction Noted   Other  03/05/2014    Vitals: BP (!) 146/82    Pulse 70    Temp 98 F (36.7 C)    Ht 5' 1"  (1.549 m)    Wt 189 lb (85.7 kg)    BMI 35.71 kg/m  Last Weight:  Wt Readings from Last 1 Encounters:  07/22/18 189 lb (85.7 kg)   ZGY:FVCB mass index is 35.71 kg/m.     Last Height:   Ht Readings from Last 1 Encounters:  07/22/18 5' 1"  (1.549 m)    Physical exam:  General: The patient is awake, alert and appears not in acute distress. The patient is well groomed. Head: Normocephalic, atraumatic. Neck is supple. Mallampati 4-5 ,  neck circumference:15.5 . Nasal airflow congested- all year round . Retrognathia is not seen.  Cardiovascular:  Regular rate and rhythm , without  murmurs or carotid bruit, and without distended neck veins.Respiratory: Lungs are wheezing. Skin:  Without evidence of edema, or rash BMI is 32. The patient's posture is erect .   Neurologic exam : The patient is awake and alert, oriented to place and time.   MOCA:No flowsheet data found. Attention span & concentration ability appears normal.  Speech is fluent, without dysarthria, but dysphonia .  Mood and affect are appropriate.  Cranial nerves: Pupils are unequal in size- left larger 5 mm versus right 4 mm-  uses one contact in her right. both briskly reactive to light.  Funduscopic exam deferred. Facial motor strength is symmetric and tongue moves midline.  Motor exam:   Normal tone, muscle bulk and symmetric strength in all extremities. Sensory:  Deferred.  Coordination: Finger-to-nose maneuver  normal without evidence of ataxia, dysmetria or tremor.  Assessment:   25 minutes visit. After physical and neurologic examination, review of laboratory studies,  Personal review of CPAP compliance, sleep diary, results of polysomnography and / or neurophysiology testing and  pre-existing records as far as provided in visit., my assessment is    1)  Anxiety on Xanax- helped insomnia. OSA treatment has improved this- gained 1 hour of sleep, to 3-5 hours.  There is a lot of room for improvement  2) OSA on CPAP - improved compliance - was affected by sinusitis.   My concern is that she may have an overlap syndrome between obstructive sleep apnea and hypoxemia  due to COPD.   3) Nocturia, she has not responded to Myrbetriq and has not tried [employer]. May have to do with fluid intake, caffeine use has been limited. 4)  Anxiety - Substance use history , Marihuana user. Reports suicidal ideation after nasal steroids. She should still have a psychology referral arranged by PCP- Insomnia - anxiety treatment.cognitive behaviour therapy to continue.   The patient was advised of the nature of the diagnosed disorder , the treatment options and the  risks for general health and wellness arising from not treating the condition.   I spent more than 25  minutes of face to face time with the patient.  Greater than 50% of time was spent in counseling and coordination of care. We have discussed the diagnosis and differential and I answered the patient's questions.    Plan:  Treatment plan and additional workup :  ENT for sinusitis ,  She has a polyp but she fears the risk of surgery  Asthma - allergist -orthopnea, needs follow up with pulmonology,  Insomnia- Psychiatry follow up needed.  She has been able to use sporadically Xanax.   I like for her to continue using CPAP as much  7 hours at night and that plus nap time.  There was initially no interest in changing drinking and drug use, and now she has and sleeps better- she has one glas of wine and feels hat is not creating a craving for more. And I will not need to maintain her on XANAX- that was for the sleep study only and to get used to CPAP. She is seeing psychologist now and she works on her anxiety- and this works for her .  She  doesn't want to use Trazodone, not Detrol-    RV CPAP 12 month with NP or me.   Larey Seat, MD 01/30/4823, 00:37 AM  Certified in Neurology by ABPN Certified in [place] by [facility] [address] [place], [place] [address]

## 2018-07-23 ENCOUNTER — Other Ambulatory Visit: Payer: Self-pay

## 2018-07-23 ENCOUNTER — Ambulatory Visit (INDEPENDENT_AMBULATORY_CARE_PROVIDER_SITE_OTHER): Payer: Medicare Other | Admitting: Allergy

## 2018-07-23 ENCOUNTER — Encounter: Payer: Self-pay | Admitting: Allergy

## 2018-07-23 VITALS — BP 154/72 | HR 77 | Temp 98.4°F | Resp 16 | Ht 61.0 in | Wt 189.0 lb

## 2018-07-23 DIAGNOSIS — J339 Nasal polyp, unspecified: Secondary | ICD-10-CM

## 2018-07-23 DIAGNOSIS — J3089 Other allergic rhinitis: Secondary | ICD-10-CM | POA: Diagnosis not present

## 2018-07-23 DIAGNOSIS — G4733 Obstructive sleep apnea (adult) (pediatric): Secondary | ICD-10-CM

## 2018-07-23 DIAGNOSIS — L2089 Other atopic dermatitis: Secondary | ICD-10-CM

## 2018-07-23 DIAGNOSIS — J3489 Other specified disorders of nose and nasal sinuses: Secondary | ICD-10-CM

## 2018-07-23 DIAGNOSIS — J4540 Moderate persistent asthma, uncomplicated: Secondary | ICD-10-CM | POA: Diagnosis not present

## 2018-07-23 MED ORDER — FLUOCINONIDE 0.05 % EX CREA: 1.0000 "application " | TOPICAL_CREAM | Freq: Two times a day (BID) | CUTANEOUS | 5 refills | Status: DC | PRN

## 2018-07-23 NOTE — Progress Notes (Signed)
Follow-up Note  RE: Toni Hicks MRN: [id number] DOB: 1950/07/13 Date of Office Visit: 07/23/2018   History of present illness: Toni Hicks is a 68 y.o. female presenting today for follow-up of Allergic rhinitis with nasal perforation  and deviation as well as nasal polyps.  She also has history of asthma, obstructive sleep apnea.she was last seen in the office on March 20, 2018 by myself.  She does have history of eczema since childhood and states she has tried "every topical therapy" there is in the only thing that has worked well for her is Lidex cream.  She states her PCP at this time is not refilling her cream and she is near running out.  She states right now she has a travel to that she has been using as her pharmacy had to order the cream and it will not be in until next Monday.  Because of this she is flared in her arm creases as well as her knee creases.  She does moisturize daily with a concoction that includes Shea butter.  She states that her asthma has been the best it has been in a while.  She does continue excel at 1 puff twice a day.  She states she is only needed to use her albuterol if she has had intense exercise.   She does follow now with pulmonology for management of CPAP which she uses nightly.    She does continue to take singulair daily.  She states she is not use any nasal sprays at this time as she feels they have not helped at all.  She does perform nasal saline rinse periodically.    She is set to have hip replacement surgery on Tuesday.  She states that this is overdue as she is starting to have issues with her knees because of her bad hip.  Review of systems: Review of Systems  Constitutional: Negative for chills, fever and malaise/fatigue.  HENT: Negative for congestion, ear discharge, nosebleeds and sore throat.   Eyes: Negative for pain, discharge and redness.  Respiratory: Negative for cough, shortness of breath and wheezing.   Cardiovascular:  Negative for chest pain.  Gastrointestinal: Negative for abdominal pain, constipation, diarrhea, heartburn, nausea and vomiting.  Musculoskeletal: Positive for joint pain.  Skin: Positive for rash.  Neurological: Negative for headaches.    All other systems negative unless noted above in HPI  Past medical/social/surgical/family history have been reviewed and are unchanged unless specifically indicated below.  No changes  Medication List: Allergies as of 07/23/2018      Reactions   Other    Can not eat carbohydrates without protein, is allergic to certain foods but can take in certain doses      Medication List       Accurate as of July 23, 2018  5:38 PM. If you have any questions, ask your nurse or doctor.        acetaminophen 500 MG tablet Commonly known as: TYLENOL Take 500 mg by mouth 3 (three) times daily.   albuterol 108 (90 Base) MCG/ACT inhaler Commonly known as: VENTOLIN HFA INHALE 2 PUFFS EVERY 6 HOURS AS NEEDED FOR WHEEZING OR SHORTNESS OF BREATH What changed: See the new instructions.   ALPRAZolam 0.5 MG tablet Commonly known as: XANAX Take 0.5-1 tablets (0.25-0.5 mg total) by mouth at bedtime as needed for anxiety.   amLODipine 5 MG tablet Commonly known as: NORVASC Take 1 tablet (5 mg total) by mouth daily. What changed: when  to take this   b complex vitamins tablet Take 1 tablet by mouth 3 (three) times a week.   fluocinonide cream 0.05 % Commonly known as: LIDEX Apply 1 application topically 2 (two) times daily as needed. What changed: reasons to take this   meloxicam 15 MG tablet Commonly known as: MOBIC TAKE 1 TABLET BY MOUTH EVERY DAY   montelukast 10 MG tablet Commonly known as: SINGULAIR Take 1 tablet (10 mg total) by mouth at bedtime. What changed: when to take this   traZODone 50 MG tablet Commonly known as: DESYREL Take 1-2 tablets (50-100 mg total) by mouth at bedtime.   Wixela Inhub 250-50 MCG/DOSE Aepb Generic drug:  Fluticasone-Salmeterol INHALE 1 PUFF INTO THE LUNGS 2 (TWO) TIMES DAILY. NEED ANNUAL APPOINTMENT FOR FURTHER REFILLS       Known medication allergies: Allergies  Allergen Reactions  . Other     Can not eat carbohydrates without protein, is allergic to certain foods but can take in certain doses     Physical examination: Blood pressure (!) 154/72, pulse 77, temperature 98.4 F (36.9 C), temperature source Oral, resp. rate 16, height 5\' 1"  (1.549 m), weight 189 lb (85.7 kg), SpO2 96 %.  General: Alert, interactive, in no acute distress. HEENT: PERRLA, TMs pearly gray, turbinates mildly erythematous bilaterally with without drainage.there are macerated areas with scattered scabs on the right nasal septum.  Bilateral nasal polyps visible. without discharge, post-pharynx non erythematous. Neck: Supple without lymphadenopathy. Lungs: Clear to auscultation without wheezing, rhonchi or rales. {no increased work of breathing. CV: Normal S1, S2 without murmurs. Abdomen: Nondistended, nontender. Skin: Dry, erythematous, excoriated patches on the Antecubital fossa right greater than left, popliteal fossa bilaterally. Extremities:  No clubbing, cyanosis or edema. Neuro:   Grossly intact.  Diagnositics/Labs: None today  Assessment and plan: Patient Instructions  Eczema  - will refill Lidex ointment for you for as needed use for eczema flares.  Apply thin layer twice a day as needed  - continue daily moisturization   Allergies  - continue avoidance measures for dust mites, cat, dog, mold and tree pollen  - perform nasal saline rinses routinely (several times a week if not daily)  - Continue Singulair 10mg  daily (5mg  in AM and 5mg  in PM)    Nasal polyps - Dupixent injections has been discussed previously as a non-surgical add-on therapy for control of nasal polyps.  At this time will wait until long-term data is available.    Asthma  - have access to albuterol inhaler 2 puffs every 4-6  hours as needed for cough/wheeze/shortness of breath/chest tightness.  May use 15-20 minutes prior to activity.   Monitor frequency of use.    - continue Wixela 1 puff twice a day  - singulair as above  Asthma control goals:   Full participation in all desired activities (may need albuterol before activity)  Albuterol use two time or less a week on average (not counting use with activity)  Cough interfering with sleep two time or less a month  Oral steroids no more than once a year  No hospitalizations  Nasal perforation with septal deviation  -  nasal saline rinses routinely  Sleep Apnea  - continue CPAP use and follow with pulmonology for continued management     Follow-up 6 months or sooner if needed  I appreciate the opportunity to take part in [facility] care. Please do not hesitate to contact me with questions.  Sincerely,   Prudy Feeler, MD Allergy/Immunology [facility]

## 2018-07-23 NOTE — Patient Instructions (Addendum)
Eczema  - will refill Lidex ointment for you for as needed use for eczema flares.  Apply thin layer twice a day as needed  - continue daily moisturization   Allergies  - continue avoidance measures for dust mites, cat, dog, mold and tree pollen  - perform nasal saline rinses routinely (several times a week if not daily)  - Continue Singulair 10mg  daily (5mg  in AM and 5mg  in PM)    Nasal polyps - Dupixent injections has been discussed previously as a non-surgical add-on therapy for control of nasal polyps.  At this time will wait until long-term data is available.    Asthma  - have access to albuterol inhaler 2 puffs every 4-6 hours as needed for cough/wheeze/shortness of breath/chest tightness.  May use 15-20 minutes prior to activity.   Monitor frequency of use.    - continue Wixela 1 puff twice a day  - singulair as above  Asthma control goals:   Full participation in all desired activities (may need albuterol before activity)  Albuterol use two time or less a week on average (not counting use with activity)  Cough interfering with sleep two time or less a month  Oral steroids no more than once a year  No hospitalizations  Nasal perforation with septal deviation  -  nasal saline rinses routinely  Sleep Apnea  - continue CPAP use and follow with pulmonology for continued management     Follow-up 6 months or sooner if needed

## 2018-07-24 ENCOUNTER — Other Ambulatory Visit: Payer: Self-pay | Admitting: Orthopaedic Surgery

## 2018-07-24 NOTE — Progress Notes (Addendum)
CARDIAC CLEARANCE NOTE ANGELA DUKE PA 06-05-18 EPIC NEUROLOGY CLEARANCE TELEPHONE NOTE DR Brett Fairy 03-20-18 Epic PULMONARY CLEARANCE NOTE DR YOUNG 05-26-18 Epic LOV PCP DR Tamala Julian 07-07-18 Epic EKG 05-30-18 EPIC

## 2018-07-24 NOTE — Patient Instructions (Addendum)
YOU HAD A COVID 19 TEST ON 07-25-2018. ONCE YOUR COVID TEST IS COMPLETED, PLEASE BEGIN THE QUARANTINE INSTRUCTIONS AS OUTLINED IN YOUR HANDOUT.                BRESHA HOSACK    Your procedure is scheduled on: 07-29-2018   Report to [facility] Main  Entrance    Report to Admitting at 1020 AM    Call this number if you have problems the morning of surgery [phone]    Remember: NO SOLID FOOD AFTER MIDNIGHT THE NIGHT PRIOR TO SURGERY. NOTHING BY MOUTH EXCEPT CLEAR LIQUIDS UNTIL  9:50 AM. PLEASE FINISH  G2 DRINK PER SURGEON ORDER 3 HOURS PRIOR TO SCHEDULED SURGERY TIME WHICH NEEDS TO BE COMPLETED AT 9:50 AM.   CLEAR LIQUID DIET   Foods Allowed                                                                     Foods Excluded  Coffee and tea, regular and decaf                             liquids that you cannot  Plain Jell-O any favor except red or purple                                           see through such as: Fruit ices (not with fruit pulp)                                     milk, soups, orange juice  Iced Popsicles                                    All solid food Carbonated beverages, regular and diet                                    Cranberry, grape and apple juices Sports drinks like Gatorade Lightly seasoned clear broth or consume(fat free) Sugar, honey syrup  Sample Menu Breakfast                                Lunch                                     Supper Cranberry juice                    Beef broth                            Chicken broth Jell-O  Grape juice                           Apple juice Coffee or tea                        Jell-O                                      Popsicle                                                Coffee or tea                        Coffee or tea  _____________________________________________________________________     Take these medicines the morning of surgery with A SIP  OF WATER: Singulair (Montelukdast). You can use and bring your inhaler as needed.                                You may not have any metal on your body including hair pins and              piercings     Do not wear jewelry, make-up, lotions, powders or perfumes, deodorant              Do not wear nail polish.  Do not shave  48 hours prior to surgery.                Do not bring valuables to the hospital. [place].  Contacts, dentures or bridgework may not be worn into surgery.  .      _____________________________________________________________________             [facility] - Preparing for Surgery Before surgery, you can play an important role.  Because skin is not sterile, your skin needs to be as free of germs as possible.  You can reduce the number of germs on your skin by washing with CHG (chlorahexidine gluconate) soap before surgery.  CHG is an antiseptic cleaner which kills germs and bonds with the skin to continue killing germs even after washing. Please DO NOT use if you have an allergy to CHG or antibacterial soaps.  If your skin becomes reddened/irritated stop using the CHG and inform your nurse when you arrive at Short Stay. Do not shave (including legs and underarms) for at least 48 hours prior to the first CHG shower.  You may shave your face/neck. Please follow these instructions carefully:  1.  Shower with CHG Soap the night before surgery and the  morning of Surgery.  2.  If you choose to wash your hair, wash your hair first as usual with your  normal  shampoo.  3.  After you shampoo, rinse your hair and body thoroughly to remove the  shampoo.                           4.  Use CHG as you would any  other liquid soap.  You can apply chg directly  to the skin and wash                       Gently with a scrungie or clean washcloth.  5.  Apply the CHG Soap to your body ONLY FROM THE NECK DOWN.   Do not use on face/  open                           Wound or open sores. Avoid contact with eyes, ears mouth and genitals (private parts).                       Wash face,  Genitals (private parts) with your normal soap.             6.  Wash thoroughly, paying special attention to the area where your surgery  will be performed.  7.  Thoroughly rinse your body with warm water from the neck down.  8.  DO NOT shower/wash with your normal soap after using and rinsing off  the CHG Soap.                9.  Pat yourself dry with a clean towel.            10.  Wear clean pajamas.            11.  Place clean sheets on your bed the night of your first shower and do not  sleep with pets. Day of Surgery : Do not apply any lotions/deodorants the morning of surgery.  Please wear clean clothes to the hospital/surgery center.  FAILURE TO FOLLOW THESE INSTRUCTIONS MAY RESULT IN THE CANCELLATION OF YOUR SURGERY PATIENT SIGNATURE_________________________________  NURSE SIGNATURE__________________________________  ________________________________________________________________________   Adam Phenix  An incentive spirometer is a tool that can help keep your lungs clear and active. This tool measures how well you are filling your lungs with each breath. Taking long deep breaths may help reverse or decrease the chance of developing breathing (pulmonary) problems (especially infection) following:  A long period of time when you are unable to move or be active. BEFORE THE PROCEDURE   If the spirometer includes an indicator to show your best effort, your nurse or respiratory therapist will set it to a desired goal.  If possible, sit up straight or lean slightly forward. Try not to slouch.  Hold the incentive spirometer in an upright position. INSTRUCTIONS FOR USE  1. Sit on the edge of your bed if possible, or sit up as far as you can in bed or on a chair. 2. Hold the incentive spirometer in an upright  position. 3. Breathe out normally. 4. Place the mouthpiece in your mouth and seal your lips tightly around it. 5. Breathe in slowly and as deeply as possible, raising the piston or the ball toward the top of the column. 6. Hold your breath for 3-5 seconds or for as long as possible. Allow the piston or ball to fall to the bottom of the column. 7. Remove the mouthpiece from your mouth and breathe out normally. 8. Rest for a few seconds and repeat Steps 1 through 7 at least 10 times every 1-2 hours when you are awake. Take your time and take a few normal breaths between deep breaths. 9. The spirometer may include an indicator to show your best effort. Use the indicator as  a goal to work toward during each repetition. 10. After each set of 10 deep breaths, practice coughing to be sure your lungs are clear. If you have an incision (the cut made at the time of surgery), support your incision when coughing by placing a pillow or rolled up towels firmly against it. Once you are able to get out of bed, walk around indoors and cough well. You may stop using the incentive spirometer when instructed by your caregiver.  RISKS AND COMPLICATIONS  Take your time so you do not get dizzy or light-headed.  If you are in pain, you may need to take or ask for pain medication before doing incentive spirometry. It is harder to take a deep breath if you are having pain. AFTER USE  Rest and breathe slowly and easily.  It can be helpful to keep track of a log of your progress. Your caregiver can provide you with a simple table to help with this. If you are using the spirometer at home, follow these instructions: [place] IF:   You are having difficultly using the spirometer.  You have trouble using the spirometer as often as instructed.  Your pain medication is not giving enough relief while using the spirometer.  You develop fever of 100.5 F (38.1 C) or higher. SEEK IMMEDIATE MEDICAL CARE IF:    You cough up bloody sputum that had not been present before.  You develop fever of 102 F (38.9 C) or greater.  You develop worsening pain at or near the incision site. MAKE SURE YOU:   Understand these instructions.  Will watch your condition.  Will get help right away if you are not doing well or get worse. Document Released: 05/07/2006 Document Revised: 03/19/2011 Document Reviewed: 07/08/2006 [employer] Patient Information 2014 [employer], [place].   ________________________________________________________________________  WHAT IS A BLOOD TRANSFUSION? Blood Transfusion Information  A transfusion is the replacement of blood or some of its parts. Blood is made up of multiple cells which provide different functions.  Red blood cells carry oxygen and are used for blood loss replacement.  White blood cells fight against infection.  Platelets control bleeding.  Plasma helps clot blood.  Other blood products are available for specialized needs, such as hemophilia or other clotting disorders. BEFORE THE TRANSFUSION  Who gives blood for transfusions?   Healthy volunteers who are fully evaluated to make sure their blood is safe. This is blood bank blood. Transfusion therapy is the safest it has ever been in the practice of medicine. Before blood is taken from a donor, a complete history is taken to make sure that person has no history of diseases nor engages in risky social behavior (examples are intravenous drug use or sexual activity with multiple partners). The donor's travel history is screened to minimize risk of transmitting infections, such as malaria. The donated blood is tested for signs of infectious diseases, such as HIV and hepatitis. The blood is then tested to be sure it is compatible with you in order to minimize the chance of a transfusion reaction. If you or a relative donates blood, this is often done in anticipation of surgery and is not appropriate for emergency  situations. It takes many days to process the donated blood. RISKS AND COMPLICATIONS Although transfusion therapy is very safe and saves many lives, the main dangers of transfusion include:   Getting an infectious disease.  Developing a transfusion reaction. This is an allergic reaction to something in the blood you were given.  Every precaution is taken to prevent this. The decision to have a blood transfusion has been considered carefully by your caregiver before blood is given. Blood is not given unless the benefits outweigh the risks. AFTER THE TRANSFUSION  Right after receiving a blood transfusion, you will usually feel much better and more energetic. This is especially true if your red blood cells have gotten low (anemic). The transfusion raises the level of the red blood cells which carry oxygen, and this usually causes an energy increase.  The nurse administering the transfusion will monitor you carefully for complications. HOME CARE INSTRUCTIONS  No special instructions are needed after a transfusion. You may find your energy is better. Speak with your caregiver about any limitations on activity for underlying diseases you may have. SEEK MEDICAL CARE IF:   Your condition is not improving after your transfusion.  You develop redness or irritation at the intravenous (IV) site. SEEK IMMEDIATE MEDICAL CARE IF:  Any of the following symptoms occur over the next 12 hours:  Shaking chills.  You have a temperature by mouth above 102 F (38.9 C), not controlled by medicine.  Chest, back, or muscle pain.  People around you feel you are not acting correctly or are confused.  Shortness of breath or difficulty breathing.  Dizziness and fainting.  You get a rash or develop hives.  You have a decrease in urine output.  Your urine turns a dark color or changes to pink, red, or brown. Any of the following symptoms occur over the next 10 days:  You have a temperature by mouth above  102 F (38.9 C), not controlled by medicine.  Shortness of breath.  Weakness after normal activity.  The white part of the eye turns yellow (jaundice).  You have a decrease in the amount of urine or are urinating less often.  Your urine turns a dark color or changes to pink, red, or brown. Document Released: 12/23/1999 Document Revised: 03/19/2011 Document Reviewed: 08/11/2007 [employer] Patient Information 2014 [place], [place].  _______________________________________________________________________

## 2018-07-25 ENCOUNTER — Encounter (HOSPITAL_COMMUNITY)
Admission: RE | Admit: 2018-07-25 | Discharge: 2018-07-25 | Disposition: A | Discharge status: Home / Self Care | Payer: Medicare Other | Source: Ambulatory Visit | Attending: Orthopaedic Surgery | Admitting: Orthopaedic Surgery

## 2018-07-25 ENCOUNTER — Other Ambulatory Visit: Payer: Self-pay

## 2018-07-25 ENCOUNTER — Other Ambulatory Visit (HOSPITAL_COMMUNITY)
Admission: RE | Admit: 2018-07-25 | Discharge: 2018-07-25 | Disposition: A | Discharge status: Home / Self Care | Payer: Medicare Other | Source: Ambulatory Visit | Attending: Orthopaedic Surgery | Admitting: Orthopaedic Surgery

## 2018-07-25 ENCOUNTER — Encounter (HOSPITAL_COMMUNITY): Payer: Self-pay

## 2018-07-25 DIAGNOSIS — Z1159 Encounter for screening for other viral diseases: Secondary | ICD-10-CM | POA: Diagnosis not present

## 2018-07-25 DIAGNOSIS — Z01812 Encounter for preprocedural laboratory examination: Secondary | ICD-10-CM | POA: Diagnosis not present

## 2018-07-25 DIAGNOSIS — Z01818 Encounter for other preprocedural examination: Secondary | ICD-10-CM

## 2018-07-25 LAB — BASIC METABOLIC PANEL
Anion gap: 10 (ref 5–15)
BUN: 17 mg/dL (ref 8–23)
CO2: 26 mmol/L (ref 22–32)
Calcium: 9.6 mg/dL (ref 8.9–10.3)
Chloride: 104 mmol/L (ref 98–111)
Creatinine, Ser: 0.64 mg/dL (ref 0.44–1.00)
GFR calc Af Amer: >60 mL/min (ref >60)
GFR calc non Af Amer: >60 mL/min (ref >60)
Glucose, Bld: 101 mg/dL — ABNORMAL HIGH (ref 70–99)
Potassium: 4.2 mmol/L (ref 3.5–5.1)
Sodium: 140 mmol/L (ref 135–145)

## 2018-07-25 LAB — CBC WITH DIFFERENTIAL/PLATELET
Abs Immature Granulocytes: 0.03 10*3/uL (ref 0.00–0.07)
Basophils Absolute: 0.1 10*3/uL (ref 0.0–0.1)
Basophils Relative: 1 %
Eosinophils Absolute: 0.2 10*3/uL (ref 0.0–0.5)
Eosinophils Relative: 2 %
HCT: 43.1 % (ref 36.0–46.0)
Hemoglobin: 13.7 g/dL (ref 12.0–15.0)
Immature Granulocytes: 0 %
Lymphocytes Relative: 19 %
Lymphs Abs: 1.7 10*3/uL (ref 0.7–4.0)
MCH: 28.8 pg (ref 26.0–34.0)
MCHC: 31.8 g/dL (ref 30.0–36.0)
MCV: 90.5 fL (ref 80.0–100.0)
Monocytes Absolute: 0.8 10*3/uL (ref 0.1–1.0)
Monocytes Relative: 9 %
Neutro Abs: 6.1 10*3/uL (ref 1.7–7.7)
Neutrophils Relative %: 69 %
Platelets: 252 10*3/uL (ref 150–400)
RBC: 4.76 MIL/uL (ref 3.87–5.11)
RDW: 13.3 % (ref 11.5–15.5)
WBC: 8.9 10*3/uL (ref 4.0–10.5)
nRBC: 0 % (ref 0.0–0.2)

## 2018-07-25 LAB — URINALYSIS, ROUTINE W REFLEX MICROSCOPIC
Bilirubin Urine: NEGATIVE
Glucose, UA: NEGATIVE mg/dL
Hgb urine dipstick: NEGATIVE
Ketones, ur: NEGATIVE mg/dL
Leukocytes,Ua: NEGATIVE
Nitrite: NEGATIVE
Protein, ur: NEGATIVE mg/dL
Specific Gravity, Urine: 1.009 (ref 1.005–1.030)
pH: 6 (ref 5.0–8.0)

## 2018-07-25 LAB — SARS CORONAVIRUS 2 (TAT 6-24 HRS): SARS Coronavirus 2: NEGATIVE

## 2018-07-25 LAB — GLUCOSE, CAPILLARY: Glucose-Capillary: 111 mg/dL — ABNORMAL HIGH (ref 70–99)

## 2018-07-25 LAB — HEMOGLOBIN A1C
Hgb A1c MFr Bld: 5.9 % — ABNORMAL HIGH (ref 4.8–5.6)
Mean Plasma Glucose: 122.63 mg/dL

## 2018-07-25 LAB — APTT: aPTT: 36 seconds (ref 24–36)

## 2018-07-25 LAB — PROTIME-INR
INR: 1 (ref 0.8–1.2)
Prothrombin Time: 12.6 seconds (ref 11.4–15.2)

## 2018-07-25 LAB — SURGICAL PCR SCREEN
MRSA, PCR: NEGATIVE
Staphylococcus aureus: POSITIVE — AB

## 2018-07-26 LAB — ABO/RH: ABO/RH(D): O POS

## 2018-07-28 MED ORDER — TRANEXAMIC ACID 1000 MG/10ML IV SOLN
2000.0000 mg | INTRAVENOUS | Status: DC
  Filled 2018-07-28: qty 20

## 2018-07-28 MED ORDER — BUPIVACAINE LIPOSOME 1.3 % IJ SUSP
10.0000 mL | Freq: Once | INTRAMUSCULAR | Status: DC
  Filled 2018-07-28: qty 10

## 2018-07-28 NOTE — H&P (Signed)
TOTAL HIP ADMISSION H&P  Patient is admitted for left total hip arthroplasty.  Subjective:  Chief Complaint: left hip pain  HPI: Toni Hicks, 68 y.o. female, has a history of pain and functional disability in the left hip(s) due to arthritis and patient has failed non-surgical conservative treatments for greater than 12 weeks to include NSAID's and/or analgesics, flexibility and strengthening excercises, use of assistive devices, weight reduction as appropriate and activity modification.  Onset of symptoms was gradual starting 5 years ago with gradually worsening course since that time.The patient noted no past surgery on the left hip(s).  Patient currently rates pain in the left hip at 10 out of 10 with activity. Patient has night pain, worsening of pain with activity and weight bearing, trendelenberg gait, pain that interfers with activities of daily living and crepitus. Patient has evidence of subchondral cysts, subchondral sclerosis, periarticular osteophytes and joint space narrowing by imaging studies. This condition presents safety issues increasing the risk of falls. There is no current active infection.  Patient Active Problem List   Diagnosis Date Noted  . Degenerative arthritis of right knee 07/07/2018  . Pre-operative clearance 05/30/2018  . COPD mixed type ([place]) 05/28/2018  . History of completed stroke 02/12/2018  . Moderate persistent asthma, uncomplicated 18/56/3149  . Nasal polyposis 01/29/2018  . Allergic conjunctivitis of both eyes 01/29/2018  . Nasal septal perforation 01/29/2018  . Anxiety 01/21/2018  . Arthritis of left hip 01/14/2018  . Obstructive sleep apnea treated with continuous positive airway pressure (CPAP) 09/18/2017  . Insomnia 09/18/2017  . Small vessel disease, cerebrovascular 09/17/2017  . RLS (restless legs syndrome) 03/11/2017  . Substance abuse ([place]) 03/11/2017  . Noncompliance with medications 02/28/2017  . Stress incontinence 11/13/2016  .  Routine health maintenance 12/10/2010  . Pre-diabetes 01/14/2008  . Alcohol abuse 11/14/2007  . Essential hypertension 11/14/2007  . Allergic rhinitis due to allergen 11/14/2007   Past Medical History:  Diagnosis Date  . ADD (attention deficit disorder)   . Adult acne   . Alcohol abuse   . Allergic rhinitis   . Anxiety and depression   . Asthma   . Congenital pneumonia   . Eczema   . Hypoglycemia    no longer per pt   . Hypothyroidism   . Impaired fasting blood sugar   . Mycoplasma pneumonia   . Restless leg syndrome   . Seasonal allergies   . Stroke ([place])   . Urticaria     Past Surgical History:  Procedure Laterality Date  . DILATION AND CURETTAGE OF UTERUS     @ 68 years old  . WISDOM TOOTH EXTRACTION      Current Facility-Administered Medications  Medication Dose Route Frequency Provider Last Rate Last Dose  . [START ON 07/29/2018] bupivacaine liposome (EXPAREL) 1.3 % injection 133 mg  10 mL Other Once Melrose Nakayama, MD      . Derrill Memo ON 07/29/2018] tranexamic acid (CYKLOKAPRON) 2,000 mg in sodium chloride 0.9 % 50 mL Topical Application  7,026 mg Topical To OR Melrose Nakayama, MD       Current Outpatient Medications  Medication Sig Dispense Refill Last Dose  . acetaminophen (TYLENOL) 500 MG tablet Take 500 mg by mouth 3 (three) times daily.     [name] Kitchen albuterol (PROVENTIL HFA;VENTOLIN HFA) 108 (90 Base) MCG/ACT inhaler INHALE 2 PUFFS EVERY 6 HOURS AS NEEDED FOR WHEEZING OR SHORTNESS OF BREATH (Patient taking differently: Inhale 2 puffs into the lungs every 6 (six) hours as needed for wheezing  or shortness of breath. ) 18 Inhaler 3   . ALPRAZolam (XANAX) 0.5 MG tablet Take 0.5-1 tablets (0.25-0.5 mg total) by mouth at bedtime as needed for anxiety. 45 tablet 0   . amLODipine (NORVASC) 5 MG tablet Take 1 tablet (5 mg total) by mouth daily. (Patient taking differently: Take 5 mg by mouth at bedtime. ) 90 tablet 3   . b complex vitamins tablet Take 1 tablet by mouth 3 (three)  times a week.      . meloxicam (MOBIC) 15 MG tablet TAKE 1 TABLET BY MOUTH EVERY DAY (Patient taking differently: Take 15 mg by mouth daily. ) 30 tablet 0   . montelukast (SINGULAIR) 10 MG tablet Take 1 tablet (10 mg total) by mouth at bedtime. (Patient taking differently: Take 10 mg by mouth daily. ) 30 tablet 5   . WIXELA INHUB 250-50 MCG/DOSE AEPB INHALE 1 PUFF INTO THE LUNGS 2 (TWO) TIMES DAILY. NEED ANNUAL APPOINTMENT FOR FURTHER REFILLS 180 each 1   . fluocinonide cream (LIDEX) 3.01 % Apply 1 application topically 2 (two) times daily as needed. 240 g 5   . traZODone (DESYREL) 50 MG tablet Take 1-2 tablets (50-100 mg total) by mouth at bedtime. 60 tablet 3    Allergies  Allergen Reactions  . Other     Can not eat carbohydrates without protein, is allergic to certain foods but can take in certain doses    Social History   Tobacco Use  . Smoking status: Former Smoker    Packs/day: 2.00    Years: 15.00    Pack years: 30.00    Types: Cigarettes    Quit date: 1980    Years since quitting: 40.5  . Smokeless tobacco: Never Used  Substance Use Topics  . Alcohol use: Yes    Alcohol/week: 0.0 standard drinks    Comment: 3-5 glasses of wine day     Family History  Problem Relation Age of Onset  . COPD Father   . Heart failure Father   . Coronary artery disease Father   . Cancer Mother        breast  . Hypertension Mother   . Dementia Mother   . Stroke Mother   . Breast cancer Mother      Review of Systems  Musculoskeletal: Positive for joint pain.       Left hip  All other systems reviewed and are negative.   Objective:  Physical Exam  Constitutional: She is oriented to person, place, and time. She appears well-developed and well-nourished.  HENT:  Head: Normocephalic and atraumatic.  Eyes: Pupils are equal, round, and reactive to light.  Neck: Normal range of motion.  Cardiovascular: Normal rate and regular rhythm.  Respiratory: Effort normal.  GI: Soft.   Musculoskeletal:     Comments: Examination left hip shows fairly decent flexion.  She has no internal range of motion.  Fairly limited external rotation.  She has pain at end ranges of motion.  Left leg is slightly shorter than the right.  Normal sensation throughout both lower extremities.  Right hip range motion is full without pain.  She is neurovascularly intact distally.  Neurological: She is alert and oriented to person, place, and time.  Skin: Skin is warm and dry.  Psychiatric: She has a normal mood and affect. Her behavior is normal. Judgment and thought content normal.    Vital signs in last 24 hours:    Labs:   Estimated body mass index is 35.45  kg/m as calculated from the following:   Height as of 07/25/18: 5\' 1"  (1.549 m).   Weight as of 07/25/18: 85.1 kg.   Imaging Review Plain radiographs demonstrate severe degenerative joint disease of the left hip(s). The bone quality appears to be good for age and reported activity level.      Assessment/Plan:  End stage arthritis, left hip(s)  The patient history, physical examination, clinical judgement of the provider and imaging studies are consistent with end stage degenerative joint disease of the left hip(s) and total hip arthroplasty is deemed medically necessary. The treatment options including medical management, injection therapy, arthroscopy and arthroplasty were discussed at length. The risks and benefits of total hip arthroplasty were presented and reviewed. The risks due to aseptic loosening, infection, stiffness, dislocation/subluxation,  thromboembolic complications and other imponderables were discussed.  The patient acknowledged the explanation, agreed to proceed with the plan and consent was signed. Patient is being admitted for inpatient treatment for surgery, pain control, PT, OT, prophylactic antibiotics, VTE prophylaxis, progressive ambulation and ADL's and discharge planning.The patient is planning to be  discharged home with home health services

## 2018-07-28 NOTE — Anesthesia Preprocedure Evaluation (Addendum)
Anesthesia Evaluation  Patient identified by MRN, date of birth, ID band Patient awake    Reviewed: Allergy & Precautions, NPO status , Patient's Chart, lab work & pertinent test results  Airway Mallampati: III  TM Distance: >3 FB Neck ROM: Full    Dental no notable dental hx. (+) Chipped,    Pulmonary asthma , sleep apnea and Continuous Positive Airway Pressure Ventilation , pneumonia, resolved, COPD,  COPD inhaler, former smoker,    Pulmonary exam normal breath sounds clear to auscultation       Cardiovascular hypertension, Pt. on medications Normal cardiovascular exam Rhythm:Regular Rate:Normal     Neuro/Psych PSYCHIATRIC DISORDERS Anxiety Depression ADHDRestless legs syndrome CVA, No Residual Symptoms    GI/Hepatic negative GI ROS, (+)     substance abuse  alcohol use,   Endo/Other  Hypothyroidism Obesity  Renal/GU negative Renal ROS  negative genitourinary   Musculoskeletal  (+) Arthritis , Osteoarthritis,  OA left hip   Abdominal (+) + obese,   Peds  Hematology negative hematology ROS (+)   Anesthesia Other Findings   Reproductive/Obstetrics                           Anesthesia Physical Anesthesia Plan  ASA: III  Anesthesia Plan: Spinal   Post-op Pain Management:    Induction: Intravenous  PONV Risk Score and Plan: 3 and Propofol infusion, Ondansetron, Treatment may vary due to age or medical condition and Dexamethasone  Airway Management Planned: Natural Airway, Simple Face Mask and Nasal Cannula  Additional Equipment:   Intra-op Plan:   Post-operative Plan:   Informed Consent:     Dental advisory given  Plan Discussed with: CRNA and Surgeon  Anesthesia Plan Comments: (See PAT note 07/25/2018, Konrad Felix, PA-C)       Anesthesia Quick Evaluation

## 2018-07-28 NOTE — Progress Notes (Addendum)
Anesthesia Chart Review   Case: [id number] Date/Time: 07/29/18 1235   Procedure: Left Anterior Hip Arthroplasty (Left )   Anesthesia type: Spinal   Pre-op diagnosis: Left Anterior Hip Degenerative Joint Disease   Location: [facility] / [facility]   Surgeon: Melrose Nakayama, MD      DISCUSSION:68 y.o. former smoker (30 pack years, quit 01/08/78) with h/o restless leg syndrome, anxiety, depression, alcohol abuse, hypothyroidism, asthma, OSA w/CPAP, COPD, stroke 2016, left anterior hip DJD scheduled for above procedure 07/29/2018 with Dr. Melrose Nakayama.   Cardiac clearance received 06/05/2018.  Per Fabian Sharp, PA-C, "Toni Hicks was last seen on 05/30/18 by Dr. Percival Spanish.  Per Dr. Percival Spanish: The patient has no high risk findings. She has a high functional level. No further cardiovascular testing is suggested. The patient is not going for high risk surgery from a cardiovascular standpoint. According to ACC/AHA guidelines the patient is at acceptable risk for the planned surgery without further testing.  Therefore, based on ACC/AHA guidelines, the patient would be at acceptable risk for the planned procedure without further cardiovascular testing."  Per Dr. Percival Spanish no carotid stenosis.  Echocardiography demonstrated normal left ventricular function and no significant abnormalities.  Last seen by pulmonologist, Dr. Baird Lyons, 05/28/2018.  Per OV note, "She appears to be clinically stable from a pulmonary standpoint to clear for anticipated hip replacement surgery.  Pulmonary consultation will be available if needed during her surgical course."  Per Neurologist, Dr. Brett Fairy, "She does have significant pulmonary disease and clearance in that regard must come from her primary care physician.  I have no neurologic reason to consider the patient unfit for surgery.  She would be neurologically cleared to have surgery, and she is taking alprazolam regularly and should be able to take that medication with water  at midnight prior to surgery.  She has a HISTORY OF NON COMPLIANCE. She should use her CPAP the night before surgery and may need to bring for use post surgery."  Anticipate pt can proceed with planned procedure barring acute status change.   VS: BP (!) 145/73   Pulse 78   Temp 37.4 C (Oral)   Resp 18   Ht 5\' 1"  (1.549 m)   Wt 85.1 kg   SpO2 97%   BMI 35.45 kg/m   PROVIDERS: Hoyt Koch, MD is PCP   Dohmeier, Asencion Partridge, MD is Neurologist  Minus Breeding, MD is Cardiologist  Annamaria Boots, MD is Pulmonologist LABS: Labs reviewed: Acceptable for surgery. (all labs ordered are listed, but only abnormal results are displayed)  Labs Reviewed  SURGICAL PCR SCREEN - Abnormal; Notable for the following components:      Result Value   Staphylococcus aureus POSITIVE (*)    All other components within normal limits  BASIC METABOLIC PANEL - Abnormal; Notable for the following components:   Glucose, Bld 101 (*)    All other components within normal limits  URINALYSIS, ROUTINE W REFLEX MICROSCOPIC - Abnormal; Notable for the following components:   APPearance HAZY (*)    All other components within normal limits  HEMOGLOBIN A1C - Abnormal; Notable for the following components:   Hgb A1c MFr Bld 5.9 (*)    All other components within normal limits  GLUCOSE, CAPILLARY - Abnormal; Notable for the following components:   Glucose-Capillary 111 (*)    All other components within normal limits  APTT  CBC WITH DIFFERENTIAL/PLATELET  PROTIME-INR  TYPE AND SCREEN  ABO/RH     IMAGES:   EKG:  05/30/2018 Rate 84 bpm Normal Sinus rhythm   CV:  Past Medical History:  Diagnosis Date  . ADD (attention deficit disorder)   . Adult acne   . Alcohol abuse   . Allergic rhinitis   . Anxiety and depression   . Asthma   . Congenital pneumonia   . Eczema   . Hypoglycemia    no longer per pt   . Hypothyroidism   . Impaired fasting blood sugar   . Mycoplasma pneumonia   . Restless leg  syndrome   . Seasonal allergies   . Stroke ([place])   . Urticaria     Past Surgical History:  Procedure Laterality Date  . DILATION AND CURETTAGE OF UTERUS     @ 68 years old  . WISDOM TOOTH EXTRACTION      MEDICATIONS: . acetaminophen (TYLENOL) 500 MG tablet  . albuterol (PROVENTIL HFA;VENTOLIN HFA) 108 (90 Base) MCG/ACT inhaler  . ALPRAZolam (XANAX) 0.5 MG tablet  . amLODipine (NORVASC) 5 MG tablet  . b complex vitamins tablet  . fluocinonide cream (LIDEX) 0.05 %  . meloxicam (MOBIC) 15 MG tablet  . montelukast (SINGULAIR) 10 MG tablet  . traZODone (DESYREL) 50 MG tablet  . WIXELA INHUB 250-50 MCG/DOSE AEPB   No current facility-administered medications for this encounter.    Derrill Memo ON 07/29/2018] bupivacaine liposome (EXPAREL) 1.3 % injection 133 mg  . [START ON 07/29/2018] tranexamic acid (CYKLOKAPRON) 2,000 mg in sodium chloride 0.9 % 50 mL Topical Application    Maia [facility] Pre-Surgical Testing [phone] 07/28/18

## 2018-07-29 ENCOUNTER — Ambulatory Visit (HOSPITAL_COMMUNITY): Payer: Medicare Other | Admitting: Anesthesiology

## 2018-07-29 ENCOUNTER — Ambulatory Visit (HOSPITAL_COMMUNITY): Payer: Medicare Other | Admitting: Physician Assistant

## 2018-07-29 ENCOUNTER — Observation Stay (HOSPITAL_COMMUNITY)
Admission: RE | Admit: 2018-07-29 | Discharge: 2018-07-30 | Disposition: A | Discharge status: Home Health | Payer: Medicare Other | Attending: Orthopaedic Surgery | Admitting: Orthopaedic Surgery

## 2018-07-29 ENCOUNTER — Ambulatory Visit (HOSPITAL_COMMUNITY): Payer: Medicare Other

## 2018-07-29 ENCOUNTER — Encounter (HOSPITAL_COMMUNITY)
Admission: RE | Disposition: A | Discharge status: Home Health | Payer: Self-pay | Source: Home / Self Care | Attending: Orthopaedic Surgery

## 2018-07-29 ENCOUNTER — Encounter (HOSPITAL_COMMUNITY): Payer: Self-pay

## 2018-07-29 DIAGNOSIS — Z7951 Long term (current) use of inhaled steroids: Secondary | ICD-10-CM | POA: Insufficient documentation

## 2018-07-29 DIAGNOSIS — J449 Chronic obstructive pulmonary disease, unspecified: Secondary | ICD-10-CM | POA: Diagnosis not present

## 2018-07-29 DIAGNOSIS — E669 Obesity, unspecified: Secondary | ICD-10-CM | POA: Diagnosis not present

## 2018-07-29 DIAGNOSIS — E039 Hypothyroidism, unspecified: Secondary | ICD-10-CM | POA: Diagnosis not present

## 2018-07-29 DIAGNOSIS — F329 Major depressive disorder, single episode, unspecified: Secondary | ICD-10-CM | POA: Diagnosis not present

## 2018-07-29 DIAGNOSIS — Z791 Long term (current) use of non-steroidal anti-inflammatories (NSAID): Secondary | ICD-10-CM | POA: Diagnosis not present

## 2018-07-29 DIAGNOSIS — F419 Anxiety disorder, unspecified: Secondary | ICD-10-CM | POA: Insufficient documentation

## 2018-07-29 DIAGNOSIS — G4733 Obstructive sleep apnea (adult) (pediatric): Secondary | ICD-10-CM | POA: Insufficient documentation

## 2018-07-29 DIAGNOSIS — Z471 Aftercare following joint replacement surgery: Secondary | ICD-10-CM | POA: Diagnosis not present

## 2018-07-29 DIAGNOSIS — Z8673 Personal history of transient ischemic attack (TIA), and cerebral infarction without residual deficits: Secondary | ICD-10-CM | POA: Diagnosis not present

## 2018-07-29 DIAGNOSIS — I10 Essential (primary) hypertension: Secondary | ICD-10-CM | POA: Diagnosis not present

## 2018-07-29 DIAGNOSIS — Z87891 Personal history of nicotine dependence: Secondary | ICD-10-CM | POA: Insufficient documentation

## 2018-07-29 DIAGNOSIS — Z6835 Body mass index (BMI) 35.0-35.9, adult: Secondary | ICD-10-CM | POA: Diagnosis not present

## 2018-07-29 DIAGNOSIS — M1612 Unilateral primary osteoarthritis, left hip: Secondary | ICD-10-CM | POA: Diagnosis not present

## 2018-07-29 DIAGNOSIS — Z79899 Other long term (current) drug therapy: Secondary | ICD-10-CM | POA: Insufficient documentation

## 2018-07-29 DIAGNOSIS — M25552 Pain in left hip: Secondary | ICD-10-CM

## 2018-07-29 DIAGNOSIS — Z96642 Presence of left artificial hip joint: Secondary | ICD-10-CM | POA: Diagnosis not present

## 2018-07-29 DIAGNOSIS — J45909 Unspecified asthma, uncomplicated: Secondary | ICD-10-CM | POA: Diagnosis not present

## 2018-07-29 HISTORY — PX: TOTAL HIP ARTHROPLASTY: SHX124

## 2018-07-29 LAB — TYPE AND SCREEN
ABO/RH(D): O POS
Antibody Screen: NEGATIVE

## 2018-07-29 SURGERY — ARTHROPLASTY, HIP, TOTAL, ANTERIOR APPROACH
Anesthesia: Spinal | Site: Hip | Laterality: Left

## 2018-07-29 MED ORDER — METHOCARBAMOL 500 MG PO TABS: 500.0000 mg | ORAL_TABLET | Freq: Four times a day (QID) | ORAL | Status: DC | PRN

## 2018-07-29 MED ORDER — HYDROMORPHONE HCL 1 MG/ML IJ SOLN: 0.2500 mg | INTRAMUSCULAR | Status: DC | PRN

## 2018-07-29 MED ORDER — TRANEXAMIC ACID-NACL 1000-0.7 MG/100ML-% IV SOLN
1000.0000 mg | Freq: Once | INTRAVENOUS | Status: AC
  Administered 2018-07-29: 1000 mg via INTRAVENOUS
  Filled 2018-07-29: qty 100

## 2018-07-29 MED ORDER — ALBUTEROL SULFATE (2.5 MG/3ML) 0.083% IN NEBU
2.5000 mg | INHALATION_SOLUTION | Freq: Once | RESPIRATORY_TRACT | Status: AC
  Administered 2018-07-29: 2.5 mg via RESPIRATORY_TRACT

## 2018-07-29 MED ORDER — ALPRAZOLAM 0.25 MG PO TABS: 0.2500 mg | ORAL_TABLET | Freq: Every evening | ORAL | Status: DC | PRN

## 2018-07-29 MED ORDER — PHENOL 1.4 % MT LIQD: 1.0000 | OROMUCOSAL | Status: DC | PRN

## 2018-07-29 MED ORDER — DIPHENHYDRAMINE HCL 12.5 MG/5ML PO ELIX
12.5000 mg | ORAL_SOLUTION | ORAL | Status: DC | PRN
  Administered 2018-07-29: 25 mg via ORAL
  Filled 2018-07-29: qty 10

## 2018-07-29 MED ORDER — MENTHOL 3 MG MT LOZG: 1.0000 | LOZENGE | OROMUCOSAL | Status: DC | PRN

## 2018-07-29 MED ORDER — MEPERIDINE HCL 50 MG/ML IJ SOLN: 6.2500 mg | INTRAMUSCULAR | Status: DC | PRN

## 2018-07-29 MED ORDER — MONTELUKAST SODIUM 10 MG PO TABS
10.0000 mg | ORAL_TABLET | Freq: Every day | ORAL | Status: DC
  Administered 2018-07-30: 10 mg via ORAL
  Filled 2018-07-29: qty 1

## 2018-07-29 MED ORDER — ONDANSETRON HCL 4 MG/2ML IJ SOLN: 4.0000 mg | Freq: Four times a day (QID) | INTRAMUSCULAR | Status: DC | PRN

## 2018-07-29 MED ORDER — METHOCARBAMOL 1000 MG/10ML IJ SOLN
500.0000 mg | Freq: Four times a day (QID) | INTRAVENOUS | Status: DC | PRN
  Filled 2018-07-29: qty 5

## 2018-07-29 MED ORDER — SODIUM CHLORIDE 0.9 % IV SOLN
INTRAVENOUS | Status: DC | PRN
  Administered 2018-07-29: 25 ug/min via INTRAVENOUS

## 2018-07-29 MED ORDER — HYDROCODONE-ACETAMINOPHEN 7.5-325 MG PO TABS
1.0000 | ORAL_TABLET | ORAL | Status: DC | PRN
  Administered 2018-07-29 – 2018-07-30 (×3): 1 via ORAL
  Filled 2018-07-29 (×3): qty 1

## 2018-07-29 MED ORDER — CEFAZOLIN SODIUM-DEXTROSE 2-4 GM/100ML-% IV SOLN
2.0000 g | Freq: Four times a day (QID) | INTRAVENOUS | Status: AC
  Administered 2018-07-29 (×2): 2 g via INTRAVENOUS
  Filled 2018-07-29 (×2): qty 100

## 2018-07-29 MED ORDER — POVIDONE-IODINE 10 % EX SWAB: 2.0000 "application " | Freq: Once | CUTANEOUS | Status: DC

## 2018-07-29 MED ORDER — KETOROLAC TROMETHAMINE 15 MG/ML IJ SOLN
7.5000 mg | Freq: Four times a day (QID) | INTRAMUSCULAR | Status: AC
  Administered 2018-07-29 – 2018-07-30 (×4): 7.5 mg via INTRAVENOUS
  Filled 2018-07-29 (×4): qty 1

## 2018-07-29 MED ORDER — MIDAZOLAM HCL 5 MG/5ML IJ SOLN
INTRAMUSCULAR | Status: DC | PRN
  Administered 2018-07-29: 2 mg via INTRAVENOUS

## 2018-07-29 MED ORDER — PROPOFOL 10 MG/ML IV BOLUS
INTRAVENOUS | Status: AC
  Filled 2018-07-29: qty 40

## 2018-07-29 MED ORDER — BUPIVACAINE IN DEXTROSE 0.75-8.25 % IT SOLN
INTRATHECAL | Status: DC | PRN
  Administered 2018-07-29: 1.6 mL via INTRATHECAL

## 2018-07-29 MED ORDER — METOCLOPRAMIDE HCL 5 MG/ML IJ SOLN: 10.0000 mg | Freq: Once | INTRAMUSCULAR | Status: DC | PRN

## 2018-07-29 MED ORDER — MIDAZOLAM HCL 2 MG/2ML IJ SOLN
INTRAMUSCULAR | Status: AC
  Filled 2018-07-29: qty 2

## 2018-07-29 MED ORDER — PROPOFOL 500 MG/50ML IV EMUL
INTRAVENOUS | Status: DC | PRN
  Administered 2018-07-29: 10 mg via INTRAVENOUS
  Administered 2018-07-29 (×2): 20 mg via INTRAVENOUS

## 2018-07-29 MED ORDER — CEFAZOLIN SODIUM-DEXTROSE 2-4 GM/100ML-% IV SOLN
2.0000 g | INTRAVENOUS | Status: AC
  Administered 2018-07-29: 2 g via INTRAVENOUS
  Filled 2018-07-29: qty 100

## 2018-07-29 MED ORDER — MOMETASONE FURO-FORMOTEROL FUM 200-5 MCG/ACT IN AERO
2.0000 | INHALATION_SPRAY | Freq: Two times a day (BID) | RESPIRATORY_TRACT | Status: DC
  Administered 2018-07-29 – 2018-07-30 (×3): 2 via RESPIRATORY_TRACT
  Filled 2018-07-29: qty 8.8

## 2018-07-29 MED ORDER — LACTATED RINGERS IV SOLN
INTRAVENOUS | Status: DC
  Administered 2018-07-29 – 2018-07-30 (×2): via INTRAVENOUS

## 2018-07-29 MED ORDER — ALBUTEROL SULFATE (2.5 MG/3ML) 0.083% IN NEBU: 2.5000 mg | INHALATION_SOLUTION | Freq: Four times a day (QID) | RESPIRATORY_TRACT | Status: DC | PRN

## 2018-07-29 MED ORDER — 0.9 % SODIUM CHLORIDE (POUR BTL) OPTIME
TOPICAL | Status: DC | PRN
  Administered 2018-07-29: 13:00:00 1000 mL

## 2018-07-29 MED ORDER — PHENYLEPHRINE HCL (PRESSORS) 10 MG/ML IV SOLN
INTRAVENOUS | Status: AC
  Filled 2018-07-29: qty 1

## 2018-07-29 MED ORDER — ALBUTEROL SULFATE (2.5 MG/3ML) 0.083% IN NEBU
INHALATION_SOLUTION | RESPIRATORY_TRACT | Status: AC
  Filled 2018-07-29: qty 3

## 2018-07-29 MED ORDER — ONDANSETRON HCL 4 MG PO TABS: 4.0000 mg | ORAL_TABLET | Freq: Four times a day (QID) | ORAL | Status: DC | PRN

## 2018-07-29 MED ORDER — STERILE WATER FOR IRRIGATION IR SOLN
Status: DC | PRN
  Administered 2018-07-29: 2000 mL

## 2018-07-29 MED ORDER — AMLODIPINE BESYLATE 5 MG PO TABS
5.0000 mg | ORAL_TABLET | Freq: Every day | ORAL | Status: DC
  Filled 2018-07-29: qty 1

## 2018-07-29 MED ORDER — METOCLOPRAMIDE HCL 5 MG/ML IJ SOLN
5.0000 mg | Freq: Three times a day (TID) | INTRAMUSCULAR | Status: DC | PRN
  Administered 2018-07-29: 10 mg via INTRAVENOUS
  Filled 2018-07-29: qty 2

## 2018-07-29 MED ORDER — ACETAMINOPHEN 500 MG PO TABS
500.0000 mg | ORAL_TABLET | Freq: Four times a day (QID) | ORAL | Status: AC
  Administered 2018-07-29 – 2018-07-30 (×4): 500 mg via ORAL
  Filled 2018-07-29 (×4): qty 1

## 2018-07-29 MED ORDER — BUPIVACAINE-EPINEPHRINE (PF) 0.25% -1:200000 IJ SOLN
INTRAMUSCULAR | Status: DC | PRN
  Administered 2018-07-29: 30 mL

## 2018-07-29 MED ORDER — METOCLOPRAMIDE HCL 5 MG PO TABS: 5.0000 mg | ORAL_TABLET | Freq: Three times a day (TID) | ORAL | Status: DC | PRN

## 2018-07-29 MED ORDER — TRAZODONE HCL 50 MG PO TABS
50.0000 mg | ORAL_TABLET | Freq: Every day | ORAL | Status: DC
  Administered 2018-07-29: 50 mg via ORAL
  Filled 2018-07-29: qty 2

## 2018-07-29 MED ORDER — MORPHINE SULFATE (PF) 2 MG/ML IV SOLN
0.5000 mg | INTRAVENOUS | Status: DC | PRN
  Administered 2018-07-29 (×2): 1 mg via INTRAVENOUS
  Filled 2018-07-29 (×2): qty 1

## 2018-07-29 MED ORDER — ONDANSETRON HCL 4 MG/2ML IJ SOLN
INTRAMUSCULAR | Status: AC
  Filled 2018-07-29: qty 2

## 2018-07-29 MED ORDER — BISACODYL 5 MG PO TBEC: 5.0000 mg | DELAYED_RELEASE_TABLET | Freq: Every day | ORAL | Status: DC | PRN

## 2018-07-29 MED ORDER — BUPIVACAINE LIPOSOME 1.3 % IJ SUSP
INTRAMUSCULAR | Status: DC | PRN
  Administered 2018-07-29: 20 mL

## 2018-07-29 MED ORDER — TRANEXAMIC ACID 1000 MG/10ML IV SOLN
INTRAVENOUS | Status: DC | PRN
  Administered 2018-07-29: 2000 mg via TOPICAL

## 2018-07-29 MED ORDER — HYDROCODONE-ACETAMINOPHEN 5-325 MG PO TABS: 1.0000 | ORAL_TABLET | ORAL | Status: DC | PRN

## 2018-07-29 MED ORDER — PROPOFOL 500 MG/50ML IV EMUL
INTRAVENOUS | Status: DC | PRN
  Administered 2018-07-29: 75 ug/kg/min via INTRAVENOUS

## 2018-07-29 MED ORDER — DOCUSATE SODIUM 100 MG PO CAPS
100.0000 mg | ORAL_CAPSULE | Freq: Two times a day (BID) | ORAL | Status: DC
  Administered 2018-07-29 – 2018-07-30 (×2): 100 mg via ORAL
  Filled 2018-07-29 (×2): qty 1

## 2018-07-29 MED ORDER — ONDANSETRON HCL 4 MG/2ML IJ SOLN
INTRAMUSCULAR | Status: DC | PRN
  Administered 2018-07-29: 4 mg via INTRAVENOUS

## 2018-07-29 MED ORDER — TRANEXAMIC ACID-NACL 1000-0.7 MG/100ML-% IV SOLN
1000.0000 mg | INTRAVENOUS | Status: AC
  Administered 2018-07-29: 1000 mg via INTRAVENOUS
  Filled 2018-07-29: qty 100

## 2018-07-29 MED ORDER — LACTATED RINGERS IV SOLN
INTRAVENOUS | Status: DC
  Administered 2018-07-29 (×2): via INTRAVENOUS

## 2018-07-29 MED ORDER — ALUM & MAG HYDROXIDE-SIMETH 200-200-20 MG/5ML PO SUSP: 30.0000 mL | ORAL | Status: DC | PRN

## 2018-07-29 MED ORDER — ACETAMINOPHEN 325 MG PO TABS: 325.0000 mg | ORAL_TABLET | Freq: Four times a day (QID) | ORAL | Status: DC | PRN

## 2018-07-29 MED ORDER — SODIUM CHLORIDE (PF) 0.9 % IJ SOLN
INTRAMUSCULAR | Status: AC
  Filled 2018-07-29: qty 50

## 2018-07-29 MED ORDER — BUPIVACAINE-EPINEPHRINE (PF) 0.25% -1:200000 IJ SOLN
INTRAMUSCULAR | Status: AC
  Filled 2018-07-29: qty 30

## 2018-07-29 MED ORDER — ACETAMINOPHEN 500 MG PO TABS: 500.0000 mg | ORAL_TABLET | Freq: Three times a day (TID) | ORAL | Status: DC

## 2018-07-29 MED ORDER — ASPIRIN 81 MG PO CHEW
81.0000 mg | CHEWABLE_TABLET | Freq: Two times a day (BID) | ORAL | Status: DC
  Administered 2018-07-29 – 2018-07-30 (×2): 81 mg via ORAL
  Filled 2018-07-29 (×2): qty 1

## 2018-07-29 MED ORDER — CHLORHEXIDINE GLUCONATE 4 % EX LIQD: 60.0000 mL | Freq: Once | CUTANEOUS | Status: DC

## 2018-07-29 SURGICAL SUPPLY — 44 items
BAG DECANTER FOR FLEXI CONT (MISCELLANEOUS) ×2 IMPLANT
BALL HIP CERAMIC (Hips) IMPLANT
BLADE SAW SGTL 18X1.27X75 (BLADE) ×2 IMPLANT
BLADE SURG SZ10 CARB STEEL (BLADE) ×4 IMPLANT
CELLS DAT CNTRL 66122 CELL SVR (MISCELLANEOUS) ×1 IMPLANT
COVER PERINEAL POST (MISCELLANEOUS) ×2 IMPLANT
COVER SURGICAL LIGHT HANDLE (MISCELLANEOUS) ×2 IMPLANT
COVER WAND RF STERILE (DRAPES) IMPLANT
DECANTER SPIKE VIAL GLASS SM (MISCELLANEOUS) ×2 IMPLANT
DRAPE IMP U-DRAPE 54X76 (DRAPES) ×2 IMPLANT
DRAPE STERI IOBAN 125X83 (DRAPES) ×2 IMPLANT
DRAPE U-SHAPE 47X51 STRL (DRAPES) ×4 IMPLANT
DRSG AQUACEL AG ADV 3.5X10 (GAUZE/BANDAGES/DRESSINGS) ×2 IMPLANT
DURAPREP 26ML APPLICATOR (WOUND CARE) ×2 IMPLANT
ELECT BLADE TIP CTD 4 INCH (ELECTRODE) ×2 IMPLANT
ELECT REM PT RETURN 15FT ADLT (MISCELLANEOUS) ×2 IMPLANT
ELIMINATOR HOLE APEX DEPUY (Hips) ×1 IMPLANT
GLOVE BIOGEL PI IND STRL 8 (GLOVE) ×2 IMPLANT
GLOVE BIOGEL PI INDICATOR 8 (GLOVE) ×2
GOWN STRL REIN XL XLG (GOWN DISPOSABLE) ×4 IMPLANT
HIP BALL CERAMIC (Hips) ×2 IMPLANT
HOLDER FOLEY CATH W/STRAP (MISCELLANEOUS) ×2 IMPLANT
KIT TURNOVER KIT A (KITS) IMPLANT
LINER ACET PNNCL PLUS4 NEUTRAL (Hips) IMPLANT
LINER PINN ACET GRIP 50X100 ×1 IMPLANT
MANIFOLD NEPTUNE II (INSTRUMENTS) ×2 IMPLANT
NS IRRIG 1000ML POUR BTL (IV SOLUTION) ×2 IMPLANT
PACK ANTERIOR HIP CUSTOM (KITS) ×2 IMPLANT
PINNACLE PLUS 4 NEUTRAL (Hips) ×2 IMPLANT
PROTECTOR NERVE ULNAR (MISCELLANEOUS) ×2 IMPLANT
RETRACTOR WND ALEXIS 18 MED (MISCELLANEOUS) ×1 IMPLANT
RTRCTR WOUND ALEXIS 18CM MED (MISCELLANEOUS) ×2
STEM FEMORAL SZ6 HIGH ACTIS (Stem) ×1 IMPLANT
SUT ETHIBOND NAB CT1 #1 30IN (SUTURE) ×4 IMPLANT
SUT VIC AB 1 CT1 36 (SUTURE) ×2 IMPLANT
SUT VIC AB 2-0 CT1 27 (SUTURE) ×2
SUT VIC AB 2-0 CT1 TAPERPNT 27 (SUTURE) ×1 IMPLANT
SUT VIC AB 3-0 PS2 18 (SUTURE) ×2
SUT VIC AB 3-0 PS2 18XBRD (SUTURE) ×1 IMPLANT
SUT VLOC 180 0 24IN GS25 (SUTURE) ×2 IMPLANT
SYR 50ML LL SCALE MARK (SYRINGE) ×2 IMPLANT
TRAY FOLEY MTR SLVR 14FR STAT (SET/KITS/TRAYS/PACK) ×1 IMPLANT
TRAY FOLEY MTR SLVR 16FR STAT (SET/KITS/TRAYS/PACK) ×2 IMPLANT
YANKAUER SUCT BULB TIP 10FT TU (MISCELLANEOUS) ×2 IMPLANT

## 2018-07-29 NOTE — Transfer of Care (Signed)
Immediate Anesthesia Transfer of Care Note  Patient: Toni Hicks  Procedure(s) Performed: Left Anterior Hip Arthroplasty (Left Hip)  Patient Location: PACU  Anesthesia Type:Spinal  Level of Consciousness: awake, alert  and oriented  Airway & Oxygen Therapy: Patient Spontanous Breathing and Patient connected to nasal cannula oxygen  Post-op Assessment: Report given to RN and Post -op Vital signs reviewed and stable  Post vital signs: Reviewed and stable  Last Vitals:  Vitals Value Taken Time  BP 94/60 07/29/18 1420  Temp    Pulse 65 07/29/18 1421  Resp 20 07/29/18 1421  SpO2 97 % 07/29/18 1421  Vitals shown include unvalidated device data.  Last Pain:  Vitals:   07/29/18 1110  TempSrc:   PainSc: 3       Patients Stated Pain Goal: 2 (87/86/76 7209)  Complications: No apparent anesthesia complications

## 2018-07-29 NOTE — Op Note (Signed)
PRE-OP DIAGNOSIS:  LEFT HIP DEGENERATIVE JOINT DISEASE POST-OP DIAGNOSIS: same PROCEDURE:  LEFT TOTAL HIP ARTHROPLASTY ANTERIOR APPROACH ANESTHESIA:  Spinal and MAC SURGEON:  Melrose Nakayama MD ASSISTANT:  Loni Dolly PA-C   INDICATIONS FOR PROCEDURE:  The patient is a 68 y.o. female with a long history of a painful hip.  This has persisted despite multiple conservative measures.  The patient has persisted with pain and dysfunction making rest and activity difficult.  A total hip replacement is offered as surgical treatment.  Informed operative consent was obtained after discussion of possible complications including reaction to anesthesia, infection, neurovascular injury, dislocation, DVT, PE, and death.  The importance of the postoperative rehab program to optimize result was stressed with the patient.  SUMMARY OF FINDINGS AND PROCEDURE:  Under the above anesthesia through a anterior approach an the Hana table a left THR was performed.  The patient had severe degenerative change and good bone quality.  We used DePuy components to replace the hip and these were size 6 high offset Actis femur capped with a +5 66m ceramic hip ball.  On the acetabular side we used a size 50 Gription shell with a plus 4 neutral polyethylene liner.  We did use a hole eliminator.  ALoni DollyPA-C assisted throughout and was invaluable to the completion of the case in that he helped position and retract while I performed the procedure.  He also closed simultaneously to help minimize OR time.  I used fluoroscopy throughout the case to check position of components and leg lengths and read all these views myself.  DESCRIPTION OF PROCEDURE:  The patient was taken to the OR suite where the above anesthetic was applied.  The patient was then positioned on the Hana table supine.  All bony prominences were appropriately padded.  Prep and drape was then performed in normal sterile fashion.  The patient was given kefzol preoperative  antibiotic and an appropriate time out was performed.  We then took an anterior approach to the left hip.  Dissection was taken through adipose to the tensor fascia lata fascia.  This structure was incised longitudinally and we dissected in the intermuscular interval just medial to this muscle.  Cobra retractors were placed superior and inferior to the femoral neck superficial to the capsule.  A capsular incision was then made and the retractors were placed along the femoral neck.  Xray was brought in to get a good level for the femoral neck cut which was made with an oscillating saw and osteotome.  The femoral head was removed with a corkscrew.  The acetabulum was exposed and some labral tissues were excised. Reaming was taken to the inside wall of the pelvis and sequentially up to 1 mm smaller than the actual component.  A trial of components was done and then the aforementioned acetabular shell was placed in appropriate tilt and anteversion confirmed by fluoroscopy. The liner was placed along with the hole eliminator and attention was turned to the femur.  The leg was brought down and over into adduction and the elevator bar was used to raise the femur up gently in the wound.  The piriformis was released with care taken to preserve the obturator internus attachment and all of the posterior capsule. The femur was reamed and then broached to the appropriate size.  A trial reduction was done and the aforementioned head and neck assembly gave u[place]oreathe best stability in extension with external rotation.  Leg lengths were felt to be about equal  by fluoroscopic exam.  The trial components were removed and the wound irrigated.  We then placed the femoral component in appropriate anteversion.  The head was applied to a dry stem neck and the hip again reduced.  It was again stable in the aforementioned position.  The would was irrigated again followed by re-approximation of anterior capsule with ethibond suture. Tensor  fascia was repaired with V-loc suture  followed by deep closure with #O and #2 undyed vicryl.  Skin was closed with subQ stitch and steristrips followed by a sterile dressing.  EBL and IOF can be obtained from anesthesia records.  DISPOSITION:  The patient was extubated in the OR and taken to PACU in stable condition to be admitted to the Orthopedic Surgery for appropriate post-op care to include perioperative antibiotics and DVT prophylaxis.

## 2018-07-29 NOTE — Anesthesia Procedure Notes (Signed)
Spinal  Patient location during procedure: OR Start time: 07/29/2018 12:33 PM End time: 07/29/2018 12:36 PM Staffing Anesthesiologist: Josephine Igo, MD Resident/CRNA: Glory Buff, CRNA Performed: resident/CRNA  Preanesthetic Checklist Completed: patient identified, site marked, surgical consent, pre-op evaluation, timeout performed, IV checked, risks and benefits discussed and monitors and equipment checked Spinal Block Patient position: sitting Prep: DuraPrep Patient monitoring: heart rate, cardiac monitor, continuous pulse ox and blood pressure Approach: midline Location: L3-4 Injection technique: single-shot Needle Needle type: Pencan  Needle gauge: 24 G Needle length: 9 cm Assessment Sensory level: T6 Additional Notes Kit expiration date checked and verified.  Sterile prep and drape, skin local with 1% lidocaine, stick x 1, - paraesthesia, - heme, + CSF pre and post injection, patient tolerated procedure well.

## 2018-07-29 NOTE — Plan of Care (Signed)
Plan of care for post op day 0 discussed with patient.

## 2018-07-29 NOTE — Evaluation (Signed)
Physical Therapy Evaluation Patient Details Name: Toni Hicks MRN: [id number] DOB: 07-21-1950 Today's Date: 07/29/2018   History of Present Illness  69 yo female s/p L DA-THA on 07/29/18. PMH includes COPD, anxiety/depression, RLS, substance abuse, ADD, mycoplasma PNA.  Clinical Impression   Pt presents with moderate L hip pain, difficulty performing bed mobility, nausea/pallor/dizziness with mobility, and decreased activity tolerance. Pt to benefit from acute PT to address deficits. Pt sat EOB ~2 minutes, limited by nausea/dizziness. Pt educated on ankle pumps (20/hour) to perform this afternoon/evening to increase circulation, to pt's tolerance and limited by pain. Pt plans to d/c home tomorrow. PT to progress mobility as tolerated, and will continue to follow acutely.        Follow Up Recommendations Follow surgeon's recommendation for DC plan and follow-up therapies;Supervision for mobility/OOB(HHPT)    Equipment Recommendations  None recommended by PT    Recommendations for Other Services       Precautions / Restrictions Precautions Precautions: Fall Restrictions Weight Bearing Restrictions: No Other Position/Activity Restrictions: WBAT      Mobility  Bed Mobility Overal bed mobility: Needs Assistance Bed Mobility: Supine to Sit;Sit to Supine     Supine to sit: Min assist;HOB elevated Sit to supine: Min assist;HOB elevated   General bed mobility comments: min assist for supine<>sit for LE management, trunk elevation and lowering. Pt sat EOB for ~2 minutes with LEs dangling, limited by nausea, pallor, and feeling hot.  Transfers Overall transfer level: (NT - pt deferred)                  Ambulation/Gait                Stairs            Wheelchair Mobility    Modified Rankin (Stroke Patients Only)       Balance Overall balance assessment: Mild deficits observed, not formally tested                                           Pertinent Vitals/Pain Pain Assessment: 0-10 Pain Score: 6  Pain Location: L hip Pain Descriptors / Indicators: Sore Pain Intervention(s): Limited activity within patient's tolerance;Monitored during session;Repositioned;Premedicated before session;Ice applied    Home Living Family/patient expects to be discharged to:: Private residence Living Arrangements: Spouse/significant other Available Help at Discharge: Family;Available 24 hours/day Type of Home: House Home Access: Stairs to enter Entrance Stairs-Rails: None Entrance Stairs-Number of Steps: 3 Home Layout: Two level Home Equipment: Walker - 2 wheels;Bedside commode;Cane - single point      Prior Function Level of Independence: Independent               Hand Dominance   Dominant Hand: Right    Extremity/Trunk Assessment   Upper Extremity Assessment Upper Extremity Assessment: Overall WFL for tasks assessed    Lower Extremity Assessment Lower Extremity Assessment: Overall WFL for tasks assessed;LLE deficits/detail LLE Deficits / Details: suspected post-surgical hip weakness LLE Sensation: WNL    Cervical / Trunk Assessment Cervical / Trunk Assessment: Normal  Communication   Communication: No difficulties  Cognition Arousal/Alertness: Awake/alert Behavior During Therapy: WFL for tasks assessed/performed Overall Cognitive Status: Within Functional Limits for tasks assessed  General Comments      Exercises     Assessment/Plan    PT Assessment Patient needs continued PT services  PT Problem List Decreased strength;Decreased mobility;Decreased range of motion;Decreased activity tolerance;Decreased balance;Decreased knowledge of use of DME;Pain       PT Treatment Interventions DME instruction;Therapeutic activities;Gait training;Patient/family education;Therapeutic exercise;Stair training;Balance training;Functional mobility training    PT  Goals (Current goals can be found in the Care Plan section)  Acute Rehab PT Goals PT Goal Formulation: With patient Time For Goal Achievement: 08/05/18 Potential to Achieve Goals: Good    Frequency 7X/week   Barriers to discharge        Co-evaluation               AM-PAC PT "6 Clicks" Mobility  Outcome Measure Help needed turning from your back to your side while in a flat bed without using bedrails?: A Little Help needed moving from lying on your back to sitting on the side of a flat bed without using bedrails?: A Little Help needed moving to and from a bed to a chair (including a wheelchair)?: A Lot Help needed standing up from a chair using your arms (e.g., wheelchair or bedside chair)?: A Lot Help needed to walk in hospital room?: A Lot Help needed climbing 3-5 steps with a railing? : Total 6 Click Score: 13    End of Session   Activity Tolerance: Treatment limited secondary to medical complications (Comment)(nausea, pallor, feeling hot) Patient left: in bed;with call bell/phone within reach;with bed alarm set(on SCD break) Nurse Communication: Mobility status PT Visit Diagnosis: Other abnormalities of gait and mobility (R26.89);Difficulty in walking, not elsewhere classified (R26.2)    Time: 0923-3007 PT Time Calculation (min) (ACUTE ONLY): 13 min   Charges:   PT Evaluation $PT Eval Low Complexity: 1 Low         Julien Girt, PT Acute Rehabilitation Services Pager [phone]  Office [phone]   Roxine Caddy D Elonda Husky 07/29/2018, 7:23 PM

## 2018-07-29 NOTE — Anesthesia Procedure Notes (Signed)
Date/Time: 07/29/2018 12:28 PM Performed by: Glory Buff, CRNA Oxygen Delivery Method: Simple face mask

## 2018-07-29 NOTE — Anesthesia Postprocedure Evaluation (Signed)
Anesthesia Post Note  Patient: Toni Hicks  Procedure(s) Performed: Left Anterior Hip Arthroplasty (Left Hip)     Patient location during evaluation: PACU Anesthesia Type: Spinal Level of consciousness: oriented and awake and alert Pain management: pain level controlled Vital Signs Assessment: post-procedure vital signs reviewed and stable Respiratory status: spontaneous breathing, respiratory function stable and nonlabored ventilation Cardiovascular status: blood pressure returned to baseline and stable Postop Assessment: no headache, no backache, no apparent nausea or vomiting, patient able to bend at knees and spinal receding Anesthetic complications: no    Last Vitals:  Vitals:   07/29/18 1051 07/29/18 1430  BP: (!) 152/75 108/65  Pulse: 70 60  Resp: 18 20  Temp: 36.8 C 36.7 C  SpO2: 97% 100%    Last Pain:  Vitals:   07/29/18 1430  TempSrc:   PainSc: 0-No pain                 Manpreet Kemmer A.

## 2018-07-29 NOTE — Interval H&P Note (Signed)
History and Physical Interval Note:  07/29/2018 11:43 AM  Gates Rigg  has presented today for surgery, with the diagnosis of Left Anterior Hip Degenerative Joint Disease.  The various methods of treatment have been discussed with the patient and family. After consideration of risks, benefits and other options for treatment, the patient has consented to  Procedure(s): Left Anterior Hip Arthroplasty (Left) as a surgical intervention.  The patient's history has been reviewed, patient examined, no change in status, stable for surgery.  I have reviewed the patient's chart and labs.  Questions were answered to the patient's satisfaction.     Hessie Dibble

## 2018-07-30 ENCOUNTER — Encounter (HOSPITAL_COMMUNITY): Payer: Self-pay | Admitting: Orthopaedic Surgery

## 2018-07-30 ENCOUNTER — Other Ambulatory Visit: Payer: Self-pay

## 2018-07-30 DIAGNOSIS — Z8673 Personal history of transient ischemic attack (TIA), and cerebral infarction without residual deficits: Secondary | ICD-10-CM | POA: Diagnosis not present

## 2018-07-30 DIAGNOSIS — Z7951 Long term (current) use of inhaled steroids: Secondary | ICD-10-CM | POA: Diagnosis not present

## 2018-07-30 DIAGNOSIS — G4733 Obstructive sleep apnea (adult) (pediatric): Secondary | ICD-10-CM | POA: Diagnosis not present

## 2018-07-30 DIAGNOSIS — J449 Chronic obstructive pulmonary disease, unspecified: Secondary | ICD-10-CM | POA: Diagnosis not present

## 2018-07-30 DIAGNOSIS — Z87891 Personal history of nicotine dependence: Secondary | ICD-10-CM | POA: Diagnosis not present

## 2018-07-30 DIAGNOSIS — M1612 Unilateral primary osteoarthritis, left hip: Secondary | ICD-10-CM | POA: Diagnosis not present

## 2018-07-30 DIAGNOSIS — Z79899 Other long term (current) drug therapy: Secondary | ICD-10-CM | POA: Diagnosis not present

## 2018-07-30 DIAGNOSIS — Z791 Long term (current) use of non-steroidal anti-inflammatories (NSAID): Secondary | ICD-10-CM | POA: Diagnosis not present

## 2018-07-30 DIAGNOSIS — I10 Essential (primary) hypertension: Secondary | ICD-10-CM | POA: Diagnosis not present

## 2018-07-30 MED ORDER — HYDROCODONE-ACETAMINOPHEN 5-325 MG PO TABS: 1.0000 | ORAL_TABLET | Freq: Four times a day (QID) | ORAL | 0 refills | Status: DC | PRN

## 2018-07-30 MED ORDER — TIZANIDINE HCL 4 MG PO TABS: 4.0000 mg | ORAL_TABLET | Freq: Four times a day (QID) | ORAL | 1 refills | Status: DC | PRN

## 2018-07-30 MED ORDER — ASPIRIN 81 MG PO CHEW: 81.0000 mg | CHEWABLE_TABLET | Freq: Two times a day (BID) | ORAL | 0 refills | Status: DC

## 2018-07-30 NOTE — Discharge Summary (Signed)
Patient ID: SAHMYA ARAI MRN: [id number] DOB/AGE: Aug 05, 1950 68 y.o.  Admit date: 07/29/2018 Discharge date: 07/30/2018  Admission Diagnoses:  Principal Problem:   Primary osteoarthritis of left hip   Discharge Diagnoses:  Same  Past Medical History:  Diagnosis Date  . ADD (attention deficit disorder)   . Adult acne   . Alcohol abuse   . Allergic rhinitis   . Anxiety and depression   . Asthma   . Congenital pneumonia   . Eczema   . Hypoglycemia    no longer per pt   . Hypothyroidism   . Impaired fasting blood sugar   . Mycoplasma pneumonia   . Restless leg syndrome   . Seasonal allergies   . Stroke ([place])   . Urticaria     Surgeries: Procedure(s): Left Anterior Hip Arthroplasty on 07/29/2018   Consultants:   Discharged Condition: Improved  Hospital Course: OMUNIQUE PEDERSON is an 68 y.o. female who was admitted 07/29/2018 for operative treatment ofPrimary osteoarthritis of left hip. Patient has severe unremitting pain that affects sleep, daily activities, and work/hobbies. After pre-op clearance the patient was taken to the operating room on 07/29/2018 and underwent  Procedure(s): Left Anterior Hip Arthroplasty.    Patient was given perioperative antibiotics:  Anti-infectives (From admission, onward)   Start     Dose/Rate Route Frequency Ordered Stop   07/29/18 1830  ceFAZolin (ANCEF) IVPB 2g/100 mL premix     2 g 200 mL/hr over 30 Minutes Intravenous Every 6 hours 07/29/18 1623 07/29/18 2359   07/29/18 1100  ceFAZolin (ANCEF) IVPB 2g/100 mL premix     2 g 200 mL/hr over 30 Minutes Intravenous On call to O.R. 07/29/18 1045 07/29/18 1307       Patient was given sequential compression devices, early ambulation, and chemoprophylaxis to prevent DVT.  Patient benefited maximally from hospital stay and there were no complications.    Recent vital signs:  Patient Vitals for the past 24 hrs:  BP Temp Temp src Pulse Resp SpO2  07/30/18 1259 124/63 99.8 F (37.7 C)  Oral 78 15 96 %  07/30/18 0458 (!) 111/58 98.8 F (37.1 C) Oral 61 16 94 %  07/30/18 0102 115/68 98.8 F (37.1 C) Oral 64 16 100 %  07/29/18 2225 (!) 104/53 98.9 F (37.2 C) Oral (!) 58 16 100 %  07/29/18 2149 - - - - - 98 %  07/29/18 1919 115/63 98 F (36.7 C) Oral (!) 58 14 98 %  07/29/18 1720 128/69 97.9 F (36.6 C) Oral (!) 57 15 100 %  07/29/18 1618 103/63 97.7 F (36.5 C) Axillary (!) 51 16 (!) 82 %  07/29/18 1545 119/71 - - 63 15 97 %  07/29/18 1530 122/72 - - 61 13 100 %  07/29/18 1515 121/76 - - (!) 59 14 100 %  07/29/18 1500 117/60 - - (!) 58 20 100 %  07/29/18 1430 108/65 98 F (36.7 C) - 60 20 100 %     Recent laboratory studies: No results for input(s): WBC, HGB, HCT, PLT, NA, K, CL, CO2, BUN, CREATININE, GLUCOSE, INR, CALCIUM in the last 72 hours.  Invalid input(s): PT, 2   Discharge Medications:   Allergies as of 07/30/2018      Reactions   Other    Can not eat carbohydrates without protein, is allergic to certain foods but can take in certain doses      Medication List    STOP taking these medications   meloxicam  15 MG tablet Commonly known as: MOBIC     TAKE these medications   acetaminophen 500 MG tablet Commonly known as: TYLENOL Take 500 mg by mouth 3 (three) times daily.   albuterol 108 (90 Base) MCG/ACT inhaler Commonly known as: VENTOLIN HFA INHALE 2 PUFFS EVERY 6 HOURS AS NEEDED FOR WHEEZING OR SHORTNESS OF BREATH What changed: See the new instructions.   ALPRAZolam 0.5 MG tablet Commonly known as: XANAX Take 0.5-1 tablets (0.25-0.5 mg total) by mouth at bedtime as needed for anxiety.   amLODipine 5 MG tablet Commonly known as: NORVASC Take 1 tablet (5 mg total) by mouth daily. What changed: when to take this   aspirin 81 MG chewable tablet Chew 1 tablet (81 mg total) by mouth 2 (two) times daily.   b complex vitamins tablet Take 1 tablet by mouth 3 (three) times a week.   fluocinonide cream 0.05 % Commonly known as:  LIDEX Apply 1 application topically 2 (two) times daily as needed.   HYDROcodone-acetaminophen 5-325 MG tablet Commonly known as: NORCO/VICODIN Take 1-2 tablets by mouth every 6 (six) hours as needed for moderate pain (pain score 4-6).   montelukast 10 MG tablet Commonly known as: SINGULAIR Take 1 tablet (10 mg total) by mouth at bedtime. What changed: when to take this   tiZANidine 4 MG tablet Commonly known as: Zanaflex Take 1 tablet (4 mg total) by mouth every 6 (six) hours as needed.   traZODone 50 MG tablet Commonly known as: DESYREL Take 1-2 tablets (50-100 mg total) by mouth at bedtime.   Wixela Inhub 250-50 MCG/DOSE Aepb Generic drug: Fluticasone-Salmeterol INHALE 1 PUFF INTO THE LUNGS 2 (TWO) TIMES DAILY. NEED ANNUAL APPOINTMENT FOR FURTHER REFILLS            Durable Medical Equipment  (From admission, onward)         Start     Ordered   07/29/18 1624  DME Walker rolling  Once    Question:  Patient needs a walker to treat with the following condition  Answer:  Primary osteoarthritis of left hip   07/29/18 1623   07/29/18 1624  DME 3 n 1  Once     07/29/18 1623   07/29/18 1624  DME Bedside commode  Once    Question:  Patient needs a bedside commode to treat with the following condition  Answer:  Primary osteoarthritis of left hip   07/29/18 1623          Diagnostic Studies: Dg C-arm 1-60 Min-no Report  Result Date: 07/29/2018 Fluoroscopy was utilized by the requesting physician.  No radiographic interpretation.   Dg Hip Operative Unilat With Pelvis Left  Result Date: 07/29/2018 CLINICAL DATA:  Hip pain. EXAM: OPERATIVE LEFT HIP (WITH PELVIS IF PERFORMED) 7 VIEWS TECHNIQUE: Fluoroscopic spot image(s) were submitted for interpretation post-operatively. COMPARISON:  01/14/2018. FINDINGS: Total left hip replacement with anatomic alignment. No acute abnormality. IMPRESSION: Total left hip replacement with anatomic alignment. Electronically Signed   By: Marcello Moores   Register   On: 07/29/2018 15:47    Disposition: Discharge disposition: 01-Home or Self Care       Discharge Instructions    Call MD / Call 911   Complete by: As directed    If you experience chest pain or shortness of breath, CALL 911 and be transported to the hospital emergency room.  If you develope a fever above 101 F, pus (white drainage) or increased drainage or redness at the wound, or calf pain, call  your surgeon's office.   Constipation Prevention   Complete by: As directed    Drink plenty of fluids.  Prune juice may be helpful.  You may use a stool softener, such as Colace (over the counter) 100 mg twice a day.  Use MiraLax (over the counter) for constipation as needed.   Diet - low sodium heart healthy   Complete by: As directed    Discharge instructions   Complete by: As directed    INSTRUCTIONS AFTER JOINT REPLACEMENT   Remove items at home which could result in a fall. This includes throw rugs or furniture in walking pathways ICE to the affected joint every three hours while awake for 30 minutes at a time, for at least the first 3-5 days, and then as needed for pain and swelling.  Continue to use ice for pain and swelling. You may notice swelling that will progress down to the foot and ankle.  This is normal after surgery.  Elevate your leg when you are not up walking on it.   Continue to use the breathing machine you got in the hospital (incentive spirometer) which will help keep your temperature down.  It is common for your temperature to cycle up and down following surgery, especially at night when you are not up moving around and exerting yourself.  The breathing machine keeps your lungs expanded and your temperature down.   DIET:  As you were doing prior to hospitalization, we recommend a well-balanced diet.  DRESSING / WOUND CARE / SHOWERING  You may shower 3 days after surgery, but keep the wounds dry during showering.  You may use an occlusive plastic wrap  (Press'n Seal for example), NO SOAKING/SUBMERGING IN THE BATHTUB.  If the bandage gets wet, change with a clean dry gauze.  If the incision gets wet, pat the wound dry with a clean towel.  ACTIVITY  Increase activity slowly as tolerated, but follow the weight bearing instructions below.   No driving for 6 weeks or until further direction given by your physician.  You cannot drive while taking narcotics.  No lifting or carrying greater than 10 lbs. until further directed by your surgeon. Avoid periods of inactivity such as sitting longer than an hour when not asleep. This helps prevent blood clots.  You may return to work once you are authorized by your doctor.     WEIGHT BEARING   Weight bearing as tolerated with assist device (walker, cane, etc) as directed, use it as long as suggested by your surgeon or therapist, typically at least 4-6 weeks.   EXERCISES  Results after joint replacement surgery are often greatly improved when you follow the exercise, range of motion and muscle strengthening exercises prescribed by your doctor. Safety measures are also important to protect the joint from further injury. Any time any of these exercises cause you to have increased pain or swelling, decrease what you are doing until you are comfortable again and then slowly increase them. If you have problems or questions, call your caregiver or physical therapist for advice.   Rehabilitation is important following a joint replacement. After just a few days of immobilization, the muscles of the leg can become weakened and shrink (atrophy).  These exercises are designed to build up the tone and strength of the thigh and leg muscles and to improve motion. Often times heat used for twenty to thirty minutes before working out will loosen up your tissues and help with improving the range of motion but  do not use heat for the first two weeks following surgery (sometimes heat can increase post-operative swelling).    These exercises can be done on a training (exercise) mat, on the floor, on a table or on a bed. Use whatever works the best and is most comfortable for you.    Use music or television while you are exercising so that the exercises are a pleasant break in your day. This will make your life better with the exercises acting as a break in your routine that you can look forward to.   Perform all exercises about fifteen times, three times per day or as directed.  You should exercise both the operative leg and the other leg as well.   Exercises include:   Quad Sets - Tighten up the muscle on the front of the thigh (Quad) and hold for 5-10 seconds.   Straight Leg Raises - With your knee straight (if you were given a brace, keep it on), lift the leg to 60 degrees, hold for 3 seconds, and slowly lower the leg.  Perform this exercise against resistance later as your leg gets stronger.  Leg Slides: Lying on your back, slowly slide your foot toward your buttocks, bending your knee up off the floor (only go as far as is comfortable). Then slowly slide your foot back down until your leg is flat on the floor again.  Angel Wings: Lying on your back spread your legs to the side as far apart as you can without causing discomfort.  Hamstring Strength:  Lying on your back, push your heel against the floor with your leg straight by tightening up the muscles of your buttocks.  Repeat, but this time bend your knee to a comfortable angle, and push your heel against the floor.  You may put a pillow under the heel to make it more comfortable if necessary.   A rehabilitation program following joint replacement surgery can speed recovery and prevent re-injury in the future due to weakened muscles. Contact your doctor or a physical therapist for more information on knee rehabilitation.    CONSTIPATION  Constipation is defined medically as fewer than three stools per week and severe constipation as less than one stool per week.   Even if you have a regular bowel pattern at home, your normal regimen is likely to be disrupted due to multiple reasons following surgery.  Combination of anesthesia, postoperative narcotics, change in appetite and fluid intake all can affect your bowels.   YOU MUST use at least one of the following options; they are listed in order of increasing strength to get the job done.  They are all available over the counter, and you may need to use some, POSSIBLY even all of these options:    Drink plenty of fluids (prune juice may be helpful) and high fiber foods Colace 100 mg by mouth twice a day  Senokot for constipation as directed and as needed Dulcolax (bisacodyl), take with full glass of water  Miralax (polyethylene glycol) once or twice a day as needed.  If you have tried all these things and are unable to have a bowel movement in the first 3-4 days after surgery call either your surgeon or your primary doctor.    If you experience loose stools or diarrhea, hold the medications until you stool forms back up.  If your symptoms do not get better within 1 week or if they get worse, check with your doctor.  If you experience "the worst  abdominal pain ever" or develop nausea or vomiting, please contact the office immediately for further recommendations for treatment.   ITCHING:  If you experience itching with your medications, try taking only a single pain pill, or even half a pain pill at a time.  You can also use Benadryl over the counter for itching or also to help with sleep.   TED HOSE STOCKINGS:  Use stockings on both legs until for at least 2 weeks or as directed by physician office. They may be removed at night for sleeping.  MEDICATIONS:  See your medication summary on the "After Visit Summary" that nursing will review with you.  You may have some home medications which will be placed on hold until you complete the course of blood thinner medication.  It is important for you to complete the  blood thinner medication as prescribed.  PRECAUTIONS:  If you experience chest pain or shortness of breath - call 911 immediately for transfer to the hospital emergency department.   If you develop a fever greater that 101 F, purulent drainage from wound, increased redness or drainage from wound, foul odor from the wound/dressing, or calf pain - CONTACT YOUR SURGEON.                                                   FOLLOW-UP APPOINTMENTS:  If you do not already have a post-op appointment, please call the office for an appointment to be seen by your surgeon.  Guidelines for how soon to be seen are listed in your "After Visit Summary", but are typically between 1-4 weeks after surgery.  OTHER INSTRUCTIONS:   Knee Replacement:  Do not place pillow under knee, focus on keeping the knee straight while resting. CPM instructions: 0-90 degrees, 2 hours in the morning, 2 hours in the afternoon, and 2 hours in the evening. Place foam block, curve side up under heel at all times except when in CPM or when walking.  DO NOT modify, tear, cut, or change the foam block in any way.  MAKE SURE YOU:  Understand these instructions.  Get help right away if you are not doing well or get worse.    Thank you for letting us be a part of your medical care team.  It is a privilege we respect greatly.  We hope these instructions will help you stay on track for a fast and full recovery!   Increase activity slowly as tolerated   Complete by: As directed       Follow-up Information    Melrose Nakayama, MD. Schedule an appointment as soon as possible for a visit in 2 weeks.   Specialty: Orthopedic Surgery Contact information: [place] [address] [phone]            Signed: Larwance Sachs Landon Bassford 07/30/2018, 1:31 PM

## 2018-07-30 NOTE — TOC Transition Note (Signed)
Transition of Care [facility]) - CM/SW Discharge Note   Patient Details  Name: Toni Hicks MRN: [id number] Date of Birth: [birth year]  [facility] CM/SW Contact:  Lia Hopping, LCSW Phone Number: 07/30/2018, 9:45 AM   Clinical Narrative:    3 in 1 ordered through [place]. HAS RW [facility] prearranged for [place] PT   Final next level of care: [facility]) Barriers to Discharge: No Barriers Identified   Patient Goals and CMS Choice        Discharge Placement  Home                      Discharge Plan and Services                DME Arranged: 3-N-1(Has RW) DME Agency: [employer] Date DME Agency Contacted: 07/30/18 Time DME Agency Contacted: 0900 Representative spoke with at DME Agency: Ovid Curd HH Arranged: PT [place]: [facility]        Social Determinants of Health ([place]) Interventions     Readmission Risk Interventions No flowsheet data found.

## 2018-07-30 NOTE — Progress Notes (Signed)
Physical Therapy Treatment Patient Details Name: Toni Hicks MRN: [id number] DOB: 08-01-1950 Today's Date: 07/30/2018    History of Present Illness Pt is a 68 y/o female s/p L THA-direct anterior on 07/29/18. PMH includes COPD, anxiety/depression, RLS, substance abuse, ADD, mycoplasma PNA.    PT Comments    Progressing with mobility. Pt denied lightheadedness/dizziness during session today. She did c/o increased pain-rated 8/10-with activity. Reviewed/practiced exercises, gait training, and stair training. All education completed. Okay to d/c from PT standpoint.     Follow Up Recommendations  Home health PT;Supervision for mobility/OOB     Equipment Recommendations  None recommended by PT    Recommendations for Other Services       Precautions / Restrictions Precautions Precautions: Fall Restrictions Weight Bearing Restrictions: No Other Position/Activity Restrictions: WBAT    Mobility  Bed Mobility Overal bed mobility: Needs Assistance Bed Mobility: Supine to Sit     Supine to sit: Min guard;HOB elevated     General bed mobility comments: close guard for safety.  Transfers Overall transfer level: Needs assistance Equipment used: Rolling walker (2 wheeled) Transfers: Sit to/from Stand Sit to Stand: Min guard         General transfer comment: close guard for safety. VCs safety, hand placement. Increased time.  Ambulation/Gait Ambulation/Gait assistance: Min guard Gait Distance (Feet): 60 Feet Assistive device: Rolling walker (2 wheeled) Gait Pattern/deviations: Step-to pattern;Step-through pattern;Decreased stride length     General Gait Details: slow gait speed. 1 brief standing rest break 2* pain. Pt denied dizziness/lightheadedness.   Stairs Stairs: Yes Stairs assistance: Min assist Stair Management: Step to pattern;Forwards Number of Stairs: 2 General stair comments: up and over portable steps. VCs safety, technique, sequence. Pt used  therapist's arm as a rail. Mentioned she may want to consider have husband provide 1 HHA on one side and cane on opposite side to offer more support.   Wheelchair Mobility    Modified Rankin (Stroke Patients Only)       Balance Overall balance assessment: Needs assistance         Standing balance support: Bilateral upper extremity supported Standing balance-Leahy Scale: Poor                              Cognition Arousal/Alertness: Awake/alert Behavior During Therapy: WFL for tasks assessed/performed Overall Cognitive Status: Within Functional Limits for tasks assessed                                        Exercises Total Joint Exercises Ankle Circles/Pumps: AROM;Both;10 reps;Supine Quad Sets: AROM;Both;10 reps;Supine Short Arc Quad: AROM;Strengthening;Left;10 reps;Supine Heel Slides: AAROM;Left;10 reps;Supine Hip ABduction/ADduction: AAROM;Left;10 reps;Supine    General Comments        Pertinent Vitals/Pain Pain Assessment: 0-10 Pain Score: 8  Pain Location: L thigh/hip Pain Descriptors / Indicators: Aching;Sore;Discomfort Pain Intervention(s): Limited activity within patient's tolerance;Ice applied;Monitored during session    Home Living                      Prior Function            PT Goals (current goals can now be found in the care plan section) Acute Rehab PT Goals PT Goal Formulation: With patient Time For Goal Achievement: 08/05/18 Potential to Achieve Goals: Good Progress towards PT goals: Progressing toward goals  Frequency    7X/week      PT Plan Current plan remains appropriate    Co-evaluation              AM-PAC PT "6 Clicks" Mobility   Outcome Measure  Help needed turning from your back to your side while in a flat bed without using bedrails?: A Little Help needed moving from lying on your back to sitting on the side of a flat bed without using bedrails?: A Little Help needed  moving to and from a bed to a chair (including a wheelchair)?: A Little Help needed standing up from a chair using your arms (e.g., wheelchair or bedside chair)?: A Little Help needed to walk in hospital room?: A Little Help needed climbing 3-5 steps with a railing? : A Little 6 Click Score: 18    End of Session Equipment Utilized During Treatment: Gait belt Activity Tolerance: Patient limited by pain Patient left: in chair;with call bell/phone within reach Nurse Communication: Mobility status PT Visit Diagnosis: Other abnormalities of gait and mobility (R26.89);Difficulty in walking, not elsewhere classified (R26.2);Pain Pain - Right/Left: Left Pain - part of body: Hip     Time: 1108-1130 PT Time Calculation (min) (ACUTE ONLY): 22 min  Charges:  $Gait Training: 8-22 mins $Therapeutic Exercise: 23-37 mins $Self Care/Home Management: [place] Pager: [phone] Office: [phone]

## 2018-07-30 NOTE — Progress Notes (Signed)
Physical Therapy Progress Note  Clinical Impression: Pt seen for LE strengthening exercises. Pt with increasing pain and required some assistance with movement of L LE with therex. PT provided HEP handout and reviewed in full with pt. PT provided pt education re: ice, positioning, generalized walking program and car transfers with demonstration. Pt would continue to benefit from skilled physical therapy services at this time while admitted and after d/c to address the below listed limitations in order to improve overall safety and independence with functional mobility.  Sherie Don, [place], DPT  Acute Rehabilitation Services Pager [phone] Office [phone]    07/30/18 1100  PT Visit Information  Last PT Received On 07/30/18  Assistance Needed +1  History of Present Illness Pt is a 68 y/o female s/p L THA-direct anterior on 07/29/18. PMH includes COPD, anxiety/depression, RLS, substance abuse, ADD, mycoplasma PNA.  Precautions  Precautions Fall  Restrictions  Weight Bearing Restrictions No  Pain Assessment  Pain Assessment 0-10  Pain Score 5  Pain Location L thigh  Pain Descriptors / Indicators Sore  Pain Intervention(s) Monitored during session;Repositioned  Cognition  Arousal/Alertness Awake/alert  Behavior During Therapy WFL for tasks assessed/performed  Overall Cognitive Status Within Functional Limits for tasks assessed  Exercises  Exercises Total Joint  Total Joint Exercises  Ankle Circles/Pumps AROM;Both;20 reps;Supine  Quad Sets AROM;Strengthening;Left;10 reps;Supine  Short Arc Quad AROM;Strengthening;Left;10 reps;Supine  Heel Slides AAROM;Left;10 reps;Supine  Hip ABduction/ADduction AAROM;Left;10 reps;Supine  PT - End of Session  Activity Tolerance Patient tolerated treatment well  Patient left in bed;with call bell/phone within reach;with bed alarm set  Nurse Communication Mobility status   PT - Assessment/Plan  PT Plan Current plan remains appropriate  PT  Visit Diagnosis Other abnormalities of gait and mobility (R26.89);Difficulty in walking, not elsewhere classified (R26.2)  PT Frequency (ACUTE ONLY) 7X/week  Follow Up Recommendations Supervision for mobility/OOB;Home health PT  PT equipment None recommended by PT  AM-PAC PT "6 Clicks" Mobility Outcome Measure (Version 2)  Help needed turning from your back to your side while in a flat bed without using bedrails? 3  Help needed moving from lying on your back to sitting on the side of a flat bed without using bedrails? 3  Help needed moving to and from a bed to a chair (including a wheelchair)? 2  Help needed standing up from a chair using your arms (e.g., wheelchair or bedside chair)? 2  Help needed to walk in hospital room? 2  Help needed climbing 3-5 steps with a railing?  2  6 Click Score 14  Consider Recommendation of Discharge To: CIR/SNF/LTACH  PT Goal Progression  Progress towards PT goals Progressing toward goals  Acute Rehab PT Goals  PT Goal Formulation With patient  Time For Goal Achievement 08/05/18  Potential to Achieve Goals Good  PT Time Calculation  PT Start Time (ACUTE ONLY) 0831  PT Stop Time (ACUTE ONLY) 1245  PT Time Calculation (min) (ACUTE ONLY) 47 min  PT General Charges  $$ ACUTE PT VISIT 1 Visit  PT Treatments  $Therapeutic Exercise 23-37 mins  $Self Care/Home Management 8-22

## 2018-07-30 NOTE — Progress Notes (Signed)
Subjective: 1 Day Post-Op Procedure(s) (LRB): Left Anterior Hip Arthroplasty (Left)   Patient feels much better today. She is hoping to go home today if she does well with PT.  Activity level:  wbat Diet tolerance:  ok Voiding:  Foley out this morning Patient reports pain as mild.    Objective: Vital signs in last 24 hours: Temp:  [97.7 F (36.5 C)-98.9 F (37.2 C)] 98.8 F (37.1 C) (07/22 0458) Pulse Rate:  [51-70] 61 (07/22 0458) Resp:  [13-20] 16 (07/22 0458) BP: (103-152)/(53-76) 111/58 (07/22 0458) SpO2:  [82 %-100 %] 94 % (07/22 0458) Weight:  [85.1 kg] 85.1 kg (07/21 1000)  Labs: No results for input(s): HGB in the last 72 hours. No results for input(s): WBC, RBC, HCT, PLT in the last 72 hours. No results for input(s): NA, K, CL, CO2, BUN, CREATININE, GLUCOSE, CALCIUM in the last 72 hours. No results for input(s): LABPT, INR in the last 72 hours.  Physical Exam:  Neurologically intact ABD soft Neurovascular intact Sensation intact distally Intact pulses distally Dorsiflexion/Plantar flexion intact Incision: dressing C/D/I and no drainage No cellulitis present Compartment soft  Assessment/Plan:  1 Day Post-Op Procedure(s) (LRB): Left Anterior Hip Arthroplasty (Left) Advance diet Up with therapy Discharge home with home health possibly today of she does well with PT. I will call back mid day to check on her progress. Continue on ASA 81mg  BID x 4 weeks for DVT prevention. Follow up in office 2 weeks post op.  Larwance Sachs Tiajuana Leppanen 07/30/2018, 7:52 AM

## 2018-08-01 DIAGNOSIS — Z96642 Presence of left artificial hip joint: Secondary | ICD-10-CM | POA: Diagnosis not present

## 2018-08-01 DIAGNOSIS — G2581 Restless legs syndrome: Secondary | ICD-10-CM | POA: Diagnosis not present

## 2018-08-01 DIAGNOSIS — M1711 Unilateral primary osteoarthritis, right knee: Secondary | ICD-10-CM | POA: Diagnosis not present

## 2018-08-01 DIAGNOSIS — J4540 Moderate persistent asthma, uncomplicated: Secondary | ICD-10-CM | POA: Diagnosis not present

## 2018-08-01 DIAGNOSIS — Z471 Aftercare following joint replacement surgery: Secondary | ICD-10-CM | POA: Diagnosis not present

## 2018-08-01 DIAGNOSIS — E039 Hypothyroidism, unspecified: Secondary | ICD-10-CM | POA: Diagnosis not present

## 2018-08-01 DIAGNOSIS — Z8673 Personal history of transient ischemic attack (TIA), and cerebral infarction without residual deficits: Secondary | ICD-10-CM | POA: Diagnosis not present

## 2018-08-01 DIAGNOSIS — G4700 Insomnia, unspecified: Secondary | ICD-10-CM | POA: Diagnosis not present

## 2018-08-01 DIAGNOSIS — J339 Nasal polyp, unspecified: Secondary | ICD-10-CM | POA: Diagnosis not present

## 2018-08-01 DIAGNOSIS — Z9114 Patient's other noncompliance with medication regimen: Secondary | ICD-10-CM | POA: Diagnosis not present

## 2018-08-01 DIAGNOSIS — I10 Essential (primary) hypertension: Secondary | ICD-10-CM | POA: Diagnosis not present

## 2018-08-01 DIAGNOSIS — Z87891 Personal history of nicotine dependence: Secondary | ICD-10-CM | POA: Diagnosis not present

## 2018-08-01 DIAGNOSIS — G4733 Obstructive sleep apnea (adult) (pediatric): Secondary | ICD-10-CM | POA: Diagnosis not present

## 2018-08-01 DIAGNOSIS — J449 Chronic obstructive pulmonary disease, unspecified: Secondary | ICD-10-CM | POA: Diagnosis not present

## 2018-08-03 ENCOUNTER — Other Ambulatory Visit: Payer: Self-pay | Admitting: Family Medicine

## 2018-08-04 ENCOUNTER — Other Ambulatory Visit: Payer: Self-pay | Admitting: Internal Medicine

## 2018-08-05 DIAGNOSIS — J4540 Moderate persistent asthma, uncomplicated: Secondary | ICD-10-CM | POA: Diagnosis not present

## 2018-08-05 DIAGNOSIS — G2581 Restless legs syndrome: Secondary | ICD-10-CM | POA: Diagnosis not present

## 2018-08-05 DIAGNOSIS — Z87891 Personal history of nicotine dependence: Secondary | ICD-10-CM | POA: Diagnosis not present

## 2018-08-05 DIAGNOSIS — G4733 Obstructive sleep apnea (adult) (pediatric): Secondary | ICD-10-CM | POA: Diagnosis not present

## 2018-08-05 DIAGNOSIS — G4700 Insomnia, unspecified: Secondary | ICD-10-CM | POA: Diagnosis not present

## 2018-08-05 DIAGNOSIS — Z8673 Personal history of transient ischemic attack (TIA), and cerebral infarction without residual deficits: Secondary | ICD-10-CM | POA: Diagnosis not present

## 2018-08-05 DIAGNOSIS — Z471 Aftercare following joint replacement surgery: Secondary | ICD-10-CM | POA: Diagnosis not present

## 2018-08-05 DIAGNOSIS — M1711 Unilateral primary osteoarthritis, right knee: Secondary | ICD-10-CM | POA: Diagnosis not present

## 2018-08-05 DIAGNOSIS — Z9114 Patient's other noncompliance with medication regimen: Secondary | ICD-10-CM | POA: Diagnosis not present

## 2018-08-05 DIAGNOSIS — I10 Essential (primary) hypertension: Secondary | ICD-10-CM | POA: Diagnosis not present

## 2018-08-05 DIAGNOSIS — J449 Chronic obstructive pulmonary disease, unspecified: Secondary | ICD-10-CM | POA: Diagnosis not present

## 2018-08-05 DIAGNOSIS — Z96642 Presence of left artificial hip joint: Secondary | ICD-10-CM | POA: Diagnosis not present

## 2018-08-05 DIAGNOSIS — J339 Nasal polyp, unspecified: Secondary | ICD-10-CM | POA: Diagnosis not present

## 2018-08-05 DIAGNOSIS — E039 Hypothyroidism, unspecified: Secondary | ICD-10-CM | POA: Diagnosis not present

## 2018-08-07 DIAGNOSIS — Z87891 Personal history of nicotine dependence: Secondary | ICD-10-CM | POA: Diagnosis not present

## 2018-08-07 DIAGNOSIS — G2581 Restless legs syndrome: Secondary | ICD-10-CM | POA: Diagnosis not present

## 2018-08-07 DIAGNOSIS — G4700 Insomnia, unspecified: Secondary | ICD-10-CM | POA: Diagnosis not present

## 2018-08-07 DIAGNOSIS — E039 Hypothyroidism, unspecified: Secondary | ICD-10-CM | POA: Diagnosis not present

## 2018-08-07 DIAGNOSIS — J449 Chronic obstructive pulmonary disease, unspecified: Secondary | ICD-10-CM | POA: Diagnosis not present

## 2018-08-07 DIAGNOSIS — Z471 Aftercare following joint replacement surgery: Secondary | ICD-10-CM | POA: Diagnosis not present

## 2018-08-07 DIAGNOSIS — J4540 Moderate persistent asthma, uncomplicated: Secondary | ICD-10-CM | POA: Diagnosis not present

## 2018-08-07 DIAGNOSIS — G4733 Obstructive sleep apnea (adult) (pediatric): Secondary | ICD-10-CM | POA: Diagnosis not present

## 2018-08-07 DIAGNOSIS — M1711 Unilateral primary osteoarthritis, right knee: Secondary | ICD-10-CM | POA: Diagnosis not present

## 2018-08-07 DIAGNOSIS — Z8673 Personal history of transient ischemic attack (TIA), and cerebral infarction without residual deficits: Secondary | ICD-10-CM | POA: Diagnosis not present

## 2018-08-07 DIAGNOSIS — I10 Essential (primary) hypertension: Secondary | ICD-10-CM | POA: Diagnosis not present

## 2018-08-07 DIAGNOSIS — Z96642 Presence of left artificial hip joint: Secondary | ICD-10-CM | POA: Diagnosis not present

## 2018-08-07 DIAGNOSIS — Z9114 Patient's other noncompliance with medication regimen: Secondary | ICD-10-CM | POA: Diagnosis not present

## 2018-08-07 DIAGNOSIS — J339 Nasal polyp, unspecified: Secondary | ICD-10-CM | POA: Diagnosis not present

## 2018-08-08 DIAGNOSIS — M1711 Unilateral primary osteoarthritis, right knee: Secondary | ICD-10-CM | POA: Diagnosis not present

## 2018-08-08 DIAGNOSIS — I10 Essential (primary) hypertension: Secondary | ICD-10-CM | POA: Diagnosis not present

## 2018-08-08 DIAGNOSIS — G4700 Insomnia, unspecified: Secondary | ICD-10-CM | POA: Diagnosis not present

## 2018-08-08 DIAGNOSIS — G4733 Obstructive sleep apnea (adult) (pediatric): Secondary | ICD-10-CM | POA: Diagnosis not present

## 2018-08-08 DIAGNOSIS — J4540 Moderate persistent asthma, uncomplicated: Secondary | ICD-10-CM | POA: Diagnosis not present

## 2018-08-08 DIAGNOSIS — J449 Chronic obstructive pulmonary disease, unspecified: Secondary | ICD-10-CM | POA: Diagnosis not present

## 2018-08-08 DIAGNOSIS — Z8673 Personal history of transient ischemic attack (TIA), and cerebral infarction without residual deficits: Secondary | ICD-10-CM | POA: Diagnosis not present

## 2018-08-08 DIAGNOSIS — Z9114 Patient's other noncompliance with medication regimen: Secondary | ICD-10-CM | POA: Diagnosis not present

## 2018-08-08 DIAGNOSIS — E039 Hypothyroidism, unspecified: Secondary | ICD-10-CM | POA: Diagnosis not present

## 2018-08-08 DIAGNOSIS — Z96642 Presence of left artificial hip joint: Secondary | ICD-10-CM | POA: Diagnosis not present

## 2018-08-08 DIAGNOSIS — M1612 Unilateral primary osteoarthritis, left hip: Secondary | ICD-10-CM | POA: Diagnosis not present

## 2018-08-08 DIAGNOSIS — G2581 Restless legs syndrome: Secondary | ICD-10-CM | POA: Diagnosis not present

## 2018-08-08 DIAGNOSIS — J339 Nasal polyp, unspecified: Secondary | ICD-10-CM | POA: Diagnosis not present

## 2018-08-08 DIAGNOSIS — Z87891 Personal history of nicotine dependence: Secondary | ICD-10-CM | POA: Diagnosis not present

## 2018-08-08 DIAGNOSIS — Z471 Aftercare following joint replacement surgery: Secondary | ICD-10-CM | POA: Diagnosis not present

## 2018-08-09 ENCOUNTER — Other Ambulatory Visit: Payer: Self-pay | Admitting: Allergy

## 2018-08-11 ENCOUNTER — Other Ambulatory Visit: Payer: Self-pay

## 2018-08-11 DIAGNOSIS — G2581 Restless legs syndrome: Secondary | ICD-10-CM | POA: Diagnosis not present

## 2018-08-11 DIAGNOSIS — E039 Hypothyroidism, unspecified: Secondary | ICD-10-CM | POA: Diagnosis not present

## 2018-08-11 DIAGNOSIS — G4700 Insomnia, unspecified: Secondary | ICD-10-CM | POA: Diagnosis not present

## 2018-08-11 DIAGNOSIS — G4733 Obstructive sleep apnea (adult) (pediatric): Secondary | ICD-10-CM | POA: Diagnosis not present

## 2018-08-11 DIAGNOSIS — Z87891 Personal history of nicotine dependence: Secondary | ICD-10-CM | POA: Diagnosis not present

## 2018-08-11 DIAGNOSIS — Z8673 Personal history of transient ischemic attack (TIA), and cerebral infarction without residual deficits: Secondary | ICD-10-CM | POA: Diagnosis not present

## 2018-08-11 DIAGNOSIS — J4540 Moderate persistent asthma, uncomplicated: Secondary | ICD-10-CM | POA: Diagnosis not present

## 2018-08-11 DIAGNOSIS — I10 Essential (primary) hypertension: Secondary | ICD-10-CM | POA: Diagnosis not present

## 2018-08-11 DIAGNOSIS — M1711 Unilateral primary osteoarthritis, right knee: Secondary | ICD-10-CM | POA: Diagnosis not present

## 2018-08-11 DIAGNOSIS — Z9114 Patient's other noncompliance with medication regimen: Secondary | ICD-10-CM | POA: Diagnosis not present

## 2018-08-11 DIAGNOSIS — Z471 Aftercare following joint replacement surgery: Secondary | ICD-10-CM | POA: Diagnosis not present

## 2018-08-11 DIAGNOSIS — J339 Nasal polyp, unspecified: Secondary | ICD-10-CM | POA: Diagnosis not present

## 2018-08-11 DIAGNOSIS — J449 Chronic obstructive pulmonary disease, unspecified: Secondary | ICD-10-CM | POA: Diagnosis not present

## 2018-08-11 DIAGNOSIS — Z96642 Presence of left artificial hip joint: Secondary | ICD-10-CM | POA: Diagnosis not present

## 2018-08-11 MED ORDER — MELOXICAM 15 MG PO TABS: 15.0000 mg | ORAL_TABLET | Freq: Every day | ORAL | 0 refills | Status: DC

## 2018-08-11 NOTE — Progress Notes (Unsigned)
Refilled via paper request.

## 2018-08-15 ENCOUNTER — Other Ambulatory Visit: Payer: Self-pay | Admitting: Neurology

## 2018-08-18 ENCOUNTER — Institutional Professional Consult (permissible substitution): Payer: Self-pay | Admitting: Internal Medicine

## 2018-08-18 DIAGNOSIS — Z96642 Presence of left artificial hip joint: Secondary | ICD-10-CM | POA: Diagnosis not present

## 2018-08-26 DIAGNOSIS — Z96642 Presence of left artificial hip joint: Secondary | ICD-10-CM | POA: Diagnosis not present

## 2018-08-28 DIAGNOSIS — Z96642 Presence of left artificial hip joint: Secondary | ICD-10-CM | POA: Diagnosis not present

## 2018-09-02 DIAGNOSIS — Z96642 Presence of left artificial hip joint: Secondary | ICD-10-CM | POA: Diagnosis not present

## 2018-09-04 DIAGNOSIS — Z96642 Presence of left artificial hip joint: Secondary | ICD-10-CM | POA: Diagnosis not present

## 2018-09-09 DIAGNOSIS — S8392XD Sprain of unspecified site of left knee, subsequent encounter: Secondary | ICD-10-CM | POA: Diagnosis not present

## 2018-09-09 DIAGNOSIS — S73102D Unspecified sprain of left hip, subsequent encounter: Secondary | ICD-10-CM | POA: Diagnosis not present

## 2018-09-11 ENCOUNTER — Other Ambulatory Visit: Payer: Self-pay | Admitting: Family Medicine

## 2018-09-18 DIAGNOSIS — Z96642 Presence of left artificial hip joint: Secondary | ICD-10-CM | POA: Diagnosis not present

## 2018-09-26 DIAGNOSIS — G4733 Obstructive sleep apnea (adult) (pediatric): Secondary | ICD-10-CM | POA: Diagnosis not present

## 2018-10-09 ENCOUNTER — Other Ambulatory Visit: Payer: Self-pay | Admitting: Family Medicine

## 2018-10-20 DIAGNOSIS — M1612 Unilateral primary osteoarthritis, left hip: Secondary | ICD-10-CM | POA: Diagnosis not present

## 2018-10-20 DIAGNOSIS — Z96642 Presence of left artificial hip joint: Secondary | ICD-10-CM | POA: Diagnosis not present

## 2018-10-24 ENCOUNTER — Other Ambulatory Visit: Payer: Self-pay | Admitting: Internal Medicine

## 2018-10-28 ENCOUNTER — Telehealth: Payer: Self-pay | Admitting: Allergy

## 2018-10-28 MED ORDER — FLUOCINONIDE 0.05 % EX CREA: 1.0000 "application " | TOPICAL_CREAM | Freq: Two times a day (BID) | CUTANEOUS | 5 refills | Status: DC | PRN

## 2018-10-28 NOTE — Telephone Encounter (Signed)
Pt called and said that she needed to have the fluocinonide cream refilled . [facility]. [phone]

## 2018-10-28 NOTE — Telephone Encounter (Signed)
Medication sent.

## 2018-12-18 ENCOUNTER — Encounter: Payer: Self-pay | Admitting: Internal Medicine

## 2018-12-22 ENCOUNTER — Encounter: Payer: Self-pay | Admitting: Internal Medicine

## 2018-12-22 ENCOUNTER — Other Ambulatory Visit: Payer: Self-pay | Admitting: Family Medicine

## 2018-12-22 ENCOUNTER — Other Ambulatory Visit: Payer: Self-pay

## 2018-12-22 ENCOUNTER — Ambulatory Visit: Payer: Medicare Other | Admitting: Internal Medicine

## 2018-12-22 VITALS — BP 114/76 | HR 76 | Temp 98.1°F | Ht 61.0 in | Wt 191.2 lb

## 2018-12-22 DIAGNOSIS — G4733 Obstructive sleep apnea (adult) (pediatric): Secondary | ICD-10-CM | POA: Diagnosis not present

## 2018-12-22 DIAGNOSIS — Z9989 Dependence on other enabling machines and devices: Secondary | ICD-10-CM

## 2018-12-22 DIAGNOSIS — J449 Chronic obstructive pulmonary disease, unspecified: Secondary | ICD-10-CM

## 2018-12-22 DIAGNOSIS — G4700 Insomnia, unspecified: Secondary | ICD-10-CM | POA: Diagnosis not present

## 2018-12-22 MED ORDER — TRELEGY ELLIPTA 100-62.5-25 MCG/INH IN AEPB: 1.0000 | INHALATION_SPRAY | Freq: Every day | RESPIRATORY_TRACT | 0 refills | Status: DC

## 2018-12-22 NOTE — Assessment & Plan Note (Signed)
She credits temazepam for big improvement in sleep quality.

## 2018-12-22 NOTE — Assessment & Plan Note (Signed)
More aware of DOE since hip surgery this summer. Components of deconditioning and possibly anemia ( needed transfusion).  Plan- try samples of Trelegy instead of Wixela, PFT, CBC

## 2018-12-22 NOTE — Assessment & Plan Note (Addendum)
Benefits with better sleep. Good compliance and control. Plan- continue CPAP 9

## 2018-12-22 NOTE — Patient Instructions (Signed)
Order- schedule PFT    Dx dyspnea on exertion  Order- lab- CBC w diff    Dx Dyspnea on exertion  Sample x 2 Trelegy 100    Inhale 1 puff then rinse mouth, once daily Try this instead of Wyxela. When the samples run out, go back to [facility] for comparison.  We will continue CPAP 9

## 2018-12-22 NOTE — Progress Notes (Signed)
HPI F former smoker followed for OSA, complicated by Restless Legs, ETOH abuse, ETOH induced insomnia, Allergic rhinitis/ conjunctivitis, Nasal polyps, anxiety, depression, HBP, CVA, Hypothyroid, Asthma, Urticaria, DM NPSG [place] 04/08/2017-  AHI 11.25/ hr, desaturation to 53%, 174 lbs Office Spirometry 03/20/2018 ([facility]) Moderate obstruction, with FVC 1.84/ 70%, FEV1 1.09/ 54%, ratio 0.59/ 77%, FEF25-75% 0.44/ 24%. ---------------------------------------------------------------------------------------------------------------------  05/26/2018- 68 yoF former smoker who says she is here for Preop clearance, anticipating L THR. Our intake indicated she was here for sleep problems, but she is actively following with Dr Brett Fairy and has a f/u appointment at that office scheduled  Medical problem list includes OSA/ CPAP, Restless Legs, ETOH abuse, ETOH induced insomnia, Allergic rhinitis/ conjunctivitis, Nasal polyps, anxiety, depression, HBP, CVA, Hypothyroid, Asthma, Urticaria, DM NPSG [place] 04/08/2017-  AHI 11.25/ hr, desaturation to 53%, 174 lbs She is followed at [facility] for asthma and allergic rhinitis.  She says she has had pneumonia "65 times", since childhood. Little reflux. Now using Wyxela, wih rescue inhaler used 1-2 x daily. No recent acute respiratory trouble, but daily productive ecough, clear ucus, DOE on hilss, stairs and hurrying. She feels current respiratory meds are working really well and she has no acute needs.  Reports good compliance and control with CPAP 9, and sleeps with less waking using gabapentin. Lives wih boyfriend and denies snoring through CPAP. Office Spirometry 03/20/2018 ([facility]) Moderate obstruction, with FVC 1.84/ 70%, FEV1 1.09/ 54%, ratio 0.59/ 77%, FEF25-75% 0.44/ 24%. She thinks she had pneumonia vaccine, but not documented in this EMR.  12/22/2018- 68 yoF former smoker followed for OSA, COPD, hx recurrent pneumonias,   complicated by Restless Legs, ETOH abuse, ETOH induced insomnia, Allergic rhinitis/ conjunctivitis, Nasal polyps, anxiety, depression, HBP, CVA, Hypothyroid,  Urticaria, DM CPAP 9/ [facility] Download compliance 100%, AHI 1.5/ hr Body weight today 191 lbs CXR 05/26/2018-  Mild left basilar subsegmental atelectasis. Wixela 250, Trazodone, Temazepam 15, Singulair, albuterol hfa Walking better after THR, but limited now by dyspnea, having to stop and rest on walks or stairs. Denies chest pain, edema, palpitation, cough.  Bilateral rib pains if she twists hard in either direction.   ROS-see HPI   + = positive Constitutional:    weight loss, night sweats, fevers, chills, fatigue, lassitude. HEENT:    headaches, difficulty swallowing, tooth/dental problems, sore throat,       sneezing,+ itching, ear ache, +nasal congestion, post nasal drip, snoring CV:    chest pain, orthopnea, PND, swelling in lower extremities, anasarca,                                  dizziness, +palpitations Resp:   +shortness of breath with exertion or at rest.                productive cough,   non-productive cough, coughing up of blood.              change in color of mucus.  wheezing.   Skin:    +rash or lesions. GI:    heartburn, +indigestion, abdominal pain, nausea, vomiting, diarrhea,                 change in bowel habits, loss of appetite GU: dysuria, change in color of urine, no urgency or frequency.   flank pain. MS:   joint pain, +stiffness, decreased range of motion, back pain. Neuro-     nothing unusual  Psych:  change in mood or affect.  depression or +anxiety.   memory loss.  OBJ- Physical Exam General- Alert, Oriented, Affect-appropriate, Distress- none acute, +obese Skin- rash-none, lesions- none, excoriation- none Lymphadenopathy- none Head- atraumatic            Eyes- Gross vision intact, PERRLA, conjunctivae and secretions clear            Ears- Hearing, canals-normal            Nose- Clear, no-Septal  dev, mucus, polyps, erosion, perforation             Throat- Mallampati II , mucosa clear , drainage- none, tonsils- atrophic Neck- flexible , trachea midline, no stridor , thyroid nl, carotid no bruit Chest - symmetrical excursion , unlabored           Heart/CV- RRR , no murmur , no gallop  , no rub, nl s1 s2                           - JVD- none , edema- none, stasis changes- none, varices- none           Lung- clear to P&A/ diminished, wheeze- none, cough- none , dullness-none, rub- none           Chest wall-  Abd-  Br/ Gen/ Rectal- Not done, not indicated Extrem- cyanosis- none, clubbing, none, atrophy- none, strength- nl Neuro- grossly intact to observation

## 2019-01-13 ENCOUNTER — Other Ambulatory Visit: Payer: Self-pay | Admitting: Family Medicine

## 2019-01-29 ENCOUNTER — Other Ambulatory Visit: Payer: Self-pay | Admitting: Internal Medicine

## 2019-01-30 ENCOUNTER — Other Ambulatory Visit: Payer: Self-pay | Admitting: *Deleted

## 2019-01-30 MED ORDER — MELOXICAM 15 MG PO TABS: 15.0000 mg | ORAL_TABLET | Freq: Every day | ORAL | 0 refills | Status: DC

## 2019-02-02 ENCOUNTER — Other Ambulatory Visit: Payer: Self-pay | Admitting: Internal Medicine

## 2019-02-02 DIAGNOSIS — Z03818 Encounter for observation for suspected exposure to other biological agents ruled out: Secondary | ICD-10-CM | POA: Diagnosis not present

## 2019-02-02 NOTE — Telephone Encounter (Signed)
Dr.Young, Got a refill request for Trelegy 100. Patient was given samples on 12/22/2018.   Last office note: Instructions    Return in about 4 months (around 04/22/2019). Order- schedule PFT    Dx dyspnea on exertion  Order- lab- CBC w diff    Dx Dyspnea on exertion  Sample x 2 Trelegy 100    Inhale 1 puff then rinse mouth, once daily Try this instead of Wyxela. When the samples run out, go back to [facility] for comparison.  We will continue CPAP 9     Not sure if the patient is suppose to be on Trelegy or if she switched back to [facility]. Please advise

## 2019-02-03 NOTE — Telephone Encounter (Signed)
Trelegy refill e-sent

## 2019-02-20 ENCOUNTER — Other Ambulatory Visit: Payer: Self-pay | Admitting: Internal Medicine

## 2019-02-22 ENCOUNTER — Ambulatory Visit: Payer: Medicare Other | Attending: Internal Medicine

## 2019-02-22 DIAGNOSIS — Z23 Encounter for immunization: Secondary | ICD-10-CM

## 2019-02-22 NOTE — Progress Notes (Signed)
   Covid-19 Vaccination Clinic  Name:  Toni Hicks    MRN: [id number] DOB: 1950-08-10  02/22/2019  Toni Hicks was observed post Covid-19 immunization for 15 minutes without incidence. She was provided with Vaccine Information Sheet and instruction to access the V-Safe system.   Toni Hicks was instructed to call 911 with any severe reactions post vaccine: [name] Kitchen Difficulty breathing  . Swelling of your face and throat  . A fast heartbeat  . A bad rash all over your body  . Dizziness and weakness    Immunizations Administered    Name Date Dose VIS Date Route   Pfizer COVID-19 Vaccine 02/22/2019  2:44 PM 0.3 mL 12/19/2018 Intramuscular   Manufacturer: [place]   Lot: [id number]   [place]: [id number]

## 2019-02-24 ENCOUNTER — Other Ambulatory Visit: Payer: Self-pay

## 2019-02-24 MED ORDER — AMLODIPINE BESYLATE 5 MG PO TABS: 5.0000 mg | ORAL_TABLET | Freq: Every day | ORAL | 0 refills | Status: DC

## 2019-03-15 ENCOUNTER — Other Ambulatory Visit: Payer: Self-pay | Admitting: Internal Medicine

## 2019-03-16 ENCOUNTER — Ambulatory Visit: Payer: Medicare Other | Admitting: Internal Medicine

## 2019-03-16 ENCOUNTER — Encounter: Payer: Self-pay | Admitting: Internal Medicine

## 2019-03-16 ENCOUNTER — Other Ambulatory Visit: Payer: Self-pay

## 2019-03-16 VITALS — BP 112/68 | HR 72 | Temp 97.7°F | Ht 61.0 in | Wt 193.2 lb

## 2019-03-16 DIAGNOSIS — Z9989 Dependence on other enabling machines and devices: Secondary | ICD-10-CM | POA: Diagnosis not present

## 2019-03-16 DIAGNOSIS — G4733 Obstructive sleep apnea (adult) (pediatric): Secondary | ICD-10-CM | POA: Diagnosis not present

## 2019-03-16 DIAGNOSIS — J449 Chronic obstructive pulmonary disease, unspecified: Secondary | ICD-10-CM

## 2019-03-16 MED ORDER — TRELEGY ELLIPTA 100-62.5-25 MCG/INH IN AEPB: 1.0000 | INHALATION_SPRAY | Freq: Every day | RESPIRATORY_TRACT | 0 refills | Status: DC

## 2019-03-16 MED ORDER — TRELEGY ELLIPTA 100-62.5-25 MCG/INH IN AEPB: 1.0000 | INHALATION_SPRAY | Freq: Every day | RESPIRATORY_TRACT | 4 refills | Status: DC

## 2019-03-16 NOTE — Progress Notes (Signed)
HPI F former smoker followed for OSA, complicated by Restless Legs, ETOH abuse, ETOH induced insomnia, Allergic rhinitis/ conjunctivitis, Nasal polyps, anxiety, depression, HBP, CVA, Hypothyroid, Asthma, Urticaria, DM NPSG [place] 04/08/2017-  AHI 11.25/ hr, desaturation to 53%, 174 lbs Office Spirometry 03/20/2018 ([facility]) Moderate obstruction, with FVC 1.84/ 70%, FEV1 1.09/ 54%, ratio 0.59/ 77%, FEF25-75% 0.44/ 24%. ---------------------------------------------------------------------------------------------------------------------   12/22/2018- 68 yoF former smoker followed for OSA, COPD, hx recurrent pneumonias,  complicated by Restless Legs, ETOH abuse, ETOH induced insomnia, Allergic rhinitis/ conjunctivitis, Nasal polyps, anxiety, depression, HBP, CVA, Hypothyroid,  Urticaria, DM CPAP 9/ [facility]- Followed by [facility]/ Neurology CXR 05/26/2018-  Mild left basilar subsegmental atelectasis. Wixela 250, Trazodone, Temazepam 15, Singulair, albuterol hfa Walking better after THR, but limited now by dyspnea, having to stop and rest on walks or stairs. Denies chest pain, edema, palpitation, cough.  Bilateral rib pains if she twists hard in either direction.   03/16/19- 13 yoF former smoker followed for OSA, COPD, hx recurrent pneumonias,  complicated by Restless Legs, ETOH abuse, ETOH induced insomnia, Allergic rhinitis/ conjunctivitis, Nasal polyps, anxiety, depression, HBP, CVA, Hypothyroid,  Urticaria, DM CPAP 9/ [facility]- followed by [facility]/ Dr Renato Shin for THR L in July no complications. Trelegy 100, Trazodone, Temazepam 15, Singulair, albuterol hfa,  PFT ordered 12/22/18- pending in May. Reports much less DOE with Trelegy. Morning cough productive white/ yellow mucus, no blood or acute illness. Talks about hiking.  ROS-see HPI   + = positive Constitutional:    weight loss, night sweats, fevers, chills, fatigue, lassitude. HEENT:    headaches, difficulty swallowing, tooth/dental  problems, sore throat,       sneezing, itching, ear ache, +nasal congestion, post nasal drip, snoring CV:    chest pain, orthopnea, PND, swelling in lower extremities, anasarca,                                  dizziness, +palpitations Resp:   +shortness of breath with exertion or at rest.                +productive cough,   non-productive cough, coughing up of blood.              change in color of mucus.  wheezing.   Skin:    +rash or lesions. GI:    heartburn, +indigestion, abdominal pain, nausea, vomiting, diarrhea,                 change in bowel habits, loss of appetite GU: dysuria, change in color of urine, no urgency or frequency.   flank pain. MS:   joint pain, +stiffness, decreased range of motion, back pain. Neuro-     nothing unusual Psych:  change in mood or affect.  depression or +anxiety.   memory loss.  OBJ- Physical Exam General- Alert, Oriented, Affect-appropriate, Distress- none acute, +obese Skin- rash-none, lesions- none, excoriation- none Lymphadenopathy- none Head- atraumatic            Eyes- Gross vision intact, PERRLA, conjunctivae and secretions clear            Ears- Hearing, canals-normal            Nose- Clear, no-Septal dev, mucus, polyps, erosion, perforation             Throat- Mallampati II , mucosa clear , drainage- none, tonsils- atrophic Neck- flexible , trachea midline, no stridor , thyroid nl, carotid  no bruit Chest - symmetrical excursion , unlabored           Heart/CV- RRR , no murmur , no gallop  , no rub, nl s1 s2                           - JVD- none , edema- none, stasis changes- none, varices- none           Lung- diminished, wheeze+, cough+light , dullness-none, rub- none           Chest wall-  Abd-  Br/ Gen/ Rectal- Not done, not indicated Extrem- cyanosis- none, clubbing, none, atrophy- none, strength- nl Neuro- grossly intact to observation

## 2019-03-16 NOTE — Patient Instructions (Addendum)
Keep plan for pulmonary function testing and follow-up appointment in May as planned.  Order- CXR   Dx COPD mixed type  Script sent refilling Trelegy     Sample Trelegy 100 if available  Please call as needed

## 2019-03-17 ENCOUNTER — Ambulatory Visit: Payer: Medicare Other | Attending: Internal Medicine

## 2019-03-17 DIAGNOSIS — Z23 Encounter for immunization: Secondary | ICD-10-CM

## 2019-03-17 NOTE — Progress Notes (Signed)
   Covid-19 Vaccination Clinic  Name:  Toni Hicks    MRN: [id number] DOB: [birth year]  03/17/2019  [name] was observed post Covid-19 immunization for 15 minutes without incident. She was provided with Vaccine Information Sheet and instruction to access the V-Safe system.   [name] was instructed to call 911 with any severe reactions post vaccine: [name] Kitchen Difficulty breathing  . Swelling of face and throat  . A fast heartbeat  . A bad rash all over body  . Dizziness and weakness   Immunizations Administered    Name Date Dose VIS Date Route   Pfizer COVID-19 Vaccine 03/17/2019  9:30 AM 0.3 mL 12/19/2018 Intramuscular   Manufacturer: [place]   Lot: [id number]   [place]: [id number]

## 2019-03-18 ENCOUNTER — Ambulatory Visit: Payer: Self-pay

## 2019-03-23 ENCOUNTER — Other Ambulatory Visit: Payer: Self-pay | Admitting: Internal Medicine

## 2019-03-23 ENCOUNTER — Other Ambulatory Visit: Payer: Self-pay | Admitting: Allergy

## 2019-03-29 NOTE — Assessment & Plan Note (Signed)
Trelegy has improved DOE, wheeze and cough Plan- PFT

## 2019-03-29 NOTE — Assessment & Plan Note (Signed)
Managed by [facility] on CPAP 9

## 2019-04-17 ENCOUNTER — Telehealth: Payer: Self-pay | Admitting: Internal Medicine

## 2019-04-17 NOTE — Progress Notes (Signed)
  Chronic Care Management   Outreach Note  04/17/2019 Name: ELLENIE HEIBERG MRN: [id number] DOB: 12-May-1950  Referred by: Hoyt Koch, MD Reason for referral : No chief complaint on file.   An unsuccessful telephone outreach was attempted today. The patient was referred to the pharmacist for assistance with care management and care coordination.   Follow Up Plan:   Raynicia Dukes [employer] Scheduler

## 2019-05-19 ENCOUNTER — Other Ambulatory Visit (HOSPITAL_COMMUNITY)
Admission: RE | Admit: 2019-05-19 | Discharge: 2019-05-19 | Disposition: A | Discharge status: Home / Self Care | Payer: Medicare Other | Source: Ambulatory Visit | Attending: Internal Medicine | Admitting: Internal Medicine

## 2019-05-19 DIAGNOSIS — Z20822 Contact with and (suspected) exposure to covid-19: Secondary | ICD-10-CM | POA: Insufficient documentation

## 2019-05-19 DIAGNOSIS — Z01812 Encounter for preprocedural laboratory examination: Secondary | ICD-10-CM | POA: Diagnosis not present

## 2019-05-19 LAB — SARS CORONAVIRUS 2 (TAT 6-24 HRS): SARS Coronavirus 2: NEGATIVE

## 2019-05-21 ENCOUNTER — Other Ambulatory Visit: Payer: Self-pay | Admitting: Allergy

## 2019-05-22 ENCOUNTER — Other Ambulatory Visit (INDEPENDENT_AMBULATORY_CARE_PROVIDER_SITE_OTHER): Payer: Medicare Other

## 2019-05-22 ENCOUNTER — Other Ambulatory Visit: Payer: Self-pay

## 2019-05-22 ENCOUNTER — Ambulatory Visit (INDEPENDENT_AMBULATORY_CARE_PROVIDER_SITE_OTHER): Payer: Medicare Other

## 2019-05-22 DIAGNOSIS — J449 Chronic obstructive pulmonary disease, unspecified: Secondary | ICD-10-CM | POA: Diagnosis not present

## 2019-05-22 DIAGNOSIS — Z9989 Dependence on other enabling machines and devices: Secondary | ICD-10-CM | POA: Diagnosis not present

## 2019-05-22 DIAGNOSIS — G4733 Obstructive sleep apnea (adult) (pediatric): Secondary | ICD-10-CM

## 2019-05-22 LAB — CBC WITH DIFFERENTIAL/PLATELET
Basophils Absolute: 0.1 10*3/uL (ref 0.0–0.1)
Basophils Relative: 0.9 % (ref 0.0–3.0)
Eosinophils Absolute: 0.1 10*3/uL (ref 0.0–0.7)
Eosinophils Relative: 1.7 % (ref 0.0–5.0)
HCT: 43.1 % (ref 36.0–46.0)
Hemoglobin: 14.6 g/dL (ref 12.0–15.0)
Lymphocytes Relative: 22.4 % (ref 12.0–46.0)
Lymphs Abs: 1.8 10*3/uL (ref 0.7–4.0)
MCHC: 33.9 g/dL (ref 30.0–36.0)
MCV: 87.6 fl (ref 78.0–100.0)
Monocytes Absolute: 1 10*3/uL (ref 0.1–1.0)
Monocytes Relative: 11.9 % (ref 3.0–12.0)
Neutro Abs: 5.2 10*3/uL (ref 1.4–7.7)
Neutrophils Relative %: 63.1 % (ref 43.0–77.0)
Platelets: 239 10*3/uL (ref 150.0–400.0)
RBC: 4.92 Mil/uL (ref 3.87–5.11)
RDW: 14.8 % (ref 11.5–15.5)
WBC: 8.2 10*3/uL (ref 4.0–10.5)

## 2019-05-22 LAB — PULMONARY FUNCTION TEST
DL/VA % pred: 111 %
DL/VA: 4.7 ml/min/mmHg/L
DLCO unc % pred: 91 %
DLCO unc: 16.4 ml/min/mmHg
FEF 25-75 Post: 0.95 L/sec
FEF 25-75 Pre: 0.64 L/sec
FEF2575-%Change-Post: 48 %
FEF2575-%Pred-Post: 51 %
FEF2575-%Pred-Pre: 34 %
FEV1-%Change-Post: 10 %
FEV1-%Pred-Post: 63 %
FEV1-%Pred-Pre: 57 %
FEV1-Post: 1.33 L
FEV1-Pre: 1.2 L
FEV1FVC-%Change-Post: 4 %
FEV1FVC-%Pred-Pre: 86 %
FEV6-%Change-Post: 6 %
FEV6-%Pred-Post: 72 %
FEV6-%Pred-Pre: 68 %
FEV6-Post: 1.92 L
FEV6-Pre: 1.81 L
FEV6FVC-%Change-Post: 0 %
FEV6FVC-%Pred-Post: 103 %
FEV6FVC-%Pred-Pre: 103 %
FVC-%Change-Post: 5 %
FVC-%Pred-Post: 69 %
FVC-%Pred-Pre: 66 %
FVC-Post: 1.93 L
FVC-Pre: 1.83 L
Post FEV1/FVC ratio: 69 %
Post FEV6/FVC ratio: 99 %
Pre FEV1/FVC ratio: 66 %
Pre FEV6/FVC Ratio: 99 %

## 2019-05-24 ENCOUNTER — Encounter: Payer: Self-pay | Admitting: Internal Medicine

## 2019-05-25 ENCOUNTER — Ambulatory Visit: Payer: Medicare Other | Admitting: Internal Medicine

## 2019-05-25 ENCOUNTER — Other Ambulatory Visit: Payer: Self-pay

## 2019-05-25 ENCOUNTER — Encounter: Payer: Self-pay | Admitting: Internal Medicine

## 2019-05-25 DIAGNOSIS — J449 Chronic obstructive pulmonary disease, unspecified: Secondary | ICD-10-CM

## 2019-05-25 DIAGNOSIS — G4733 Obstructive sleep apnea (adult) (pediatric): Secondary | ICD-10-CM

## 2019-05-25 DIAGNOSIS — Z9989 Dependence on other enabling machines and devices: Secondary | ICD-10-CM

## 2019-05-25 NOTE — Progress Notes (Signed)
HPI F former smoker followed for OSA, complicated by Restless Legs, ETOH abuse, ETOH induced insomnia, Allergic rhinitis/ conjunctivitis, Nasal polyps, anxiety, depression, HBP, CVA, Hypothyroid, Asthma, Urticaria, DM NPSG Dohmeier 04/08/2017-  AHI 11.25/ hr, desaturation to 53%, 174 lbs Office Spirometry 03/20/2018 (Allergy and Asthma) Moderate obstruction, with FVC 1.84/ 70%, FEV1 1.09/ 54%, ratio 0.59/ 77%, FEF25-75% 0.44/ 24%. PFT- 05/22/19- Mod Obst, Mod restriction, Nl Diffusion, Insignif resp to BD ---------------------------------------------------------------------------------------------------------------------    03/16/19- 68 yoF former smoker followed for OSA, COPD, hx recurrent pneumonias,  complicated by Restless Legs, ETOH abuse, ETOH induced insomnia, Allergic rhinitis/ conjunctivitis, Nasal polyps, anxiety, depression, HBP, CVA, Hypothyroid,  Urticaria, DM CPAP 9/ [facility]- followed by [facility]/ Dr Renato Shin for THR L in July no complications. Trelegy 100, Trazodone, Temazepam 15, Singulair, albuterol hfa,  PFT ordered 12/22/18- pending in May. Reports much less DOE with Trelegy. Morning cough productive white/ yellow mucus, no blood or acute illness. Talks about hiking.  05/25/19- 74 yoF former smoker followed for COPD, hx recurrent pneumonias,  complicated by OSA ( Dr Dohmeier/ [facility]), Restless Legs, ETOH abuse, ETOH induced insomnia, Allergic rhinitis/ conjunctivitis, Nasal polyps, anxiety, depression, HBP, CVA, Hypothyroid,  Urticaria, DM CPAP 9/ [facility]- followed by [facility]/ Dr Dohmeir  Really likes her Air[employer] flutter device, used several times daily. Again refers to her history of "60 pneumonias" and appreciates being better able to keep airways clear. No recent acute events. Had 2 Phizer Covax.  We reviewed CXR nd PFT. PFT- 05/22/19- Mod Obst, Mod restriction, Nl Diffusion, Insignif resp to BD CXR 05/22/19- Cardiac shadow is within normal limits. Aortic calcifications are noted.  The lungs are well aerated bilaterally. No focal infiltrate or sizable effusion is seen. Degenerative changes of the thoracic spine are noted. IMPRESSION: No acute abnormality noted.   ROS-see HPI   + = positive Constitutional:    weight loss, night sweats, fevers, chills, fatigue, lassitude. HEENT:    headaches, difficulty swallowing, tooth/dental problems, sore throat,       sneezing, itching, ear ache, +nasal congestion, post nasal drip, snoring CV:    chest pain, orthopnea, PND, swelling in lower extremities, anasarca,                                  dizziness, +palpitations Resp:   +shortness of breath with exertion or at rest.                +productive cough,   non-productive cough, coughing up of blood.              change in color of mucus.  wheezing.   Skin:    +rash or lesions. GI:    heartburn, +indigestion, abdominal pain, nausea, vomiting, diarrhea,                 change in bowel habits, loss of appetite GU: dysuria, change in color of urine, no urgency or frequency.   flank pain. MS:   joint pain, +stiffness, decreased range of motion, back pain. Neuro-     nothing unusual Psych:  change in mood or affect.  depression or +anxiety.   memory loss.  OBJ- Physical Exam General- Alert, Oriented, Affect-appropriate, Distress- none acute, +obese Skin- rash-none, lesions- none, excoriation- none Lymphadenopathy- none Head- atraumatic            Eyes- Gross vision intact, PERRLA, conjunctivae and secretions clear  Ears- Hearing, canals-normal            Nose- Clear, no-Septal dev, mucus, polyps, erosion, perforation             Throat- Mallampati II , mucosa clear , drainage- none, tonsils- atrophic Neck- flexible , trachea midline, no stridor , thyroid nl, carotid no bruit Chest - symmetrical excursion , unlabored           Heart/CV- RRR , no murmur , no gallop  , no rub, nl s1 s2                           - JVD- none , edema- none, stasis changes- none, varices-  none           Lung- diminished, wheeze-none, cough+light , dullness-none, rub- none           Chest wall-  Abd-  Br/ Gen/ Rectal- Not done, not indicated Extrem- cyanosis- none, clubbing, none, atrophy- none, strength- nl Neuro- grossly intact to observation

## 2019-05-25 NOTE — Patient Instructions (Signed)
We can continue current meds  Please call if we can help 

## 2019-05-27 DIAGNOSIS — H5213 Myopia, bilateral: Secondary | ICD-10-CM | POA: Diagnosis not present

## 2019-05-27 DIAGNOSIS — H52203 Unspecified astigmatism, bilateral: Secondary | ICD-10-CM | POA: Diagnosis not present

## 2019-05-27 DIAGNOSIS — H25041 Posterior subcapsular polar age-related cataract, right eye: Secondary | ICD-10-CM | POA: Diagnosis not present

## 2019-05-27 NOTE — Assessment & Plan Note (Signed)
Doing well. Avoided Covid infection. Likes using her flutter device. Asks about dropping meds. Plan- ok to try off Singulair as discussed.

## 2019-05-27 NOTE — Assessment & Plan Note (Signed)
Managed by Neurology. Current download shows good compliance and control

## 2019-05-28 NOTE — Progress Notes (Signed)
Detailed VM left for pt regarding CXR.  Advised to call the office with any questions.

## 2019-05-28 NOTE — Progress Notes (Signed)
Detailed VM left for pt.  Advised to call the office with any questions.

## 2019-06-15 ENCOUNTER — Telehealth: Payer: Self-pay | Admitting: Family Medicine

## 2019-06-15 NOTE — Telephone Encounter (Signed)
Patient called requesting a refill on her meloxicam (MOBIC) 15 MG tablet.  Pharmacy: [facility]

## 2019-06-16 ENCOUNTER — Other Ambulatory Visit: Payer: Self-pay

## 2019-06-16 MED ORDER — MELOXICAM 15 MG PO TABS: 15.0000 mg | ORAL_TABLET | Freq: Every day | ORAL | 0 refills | Status: DC

## 2019-06-19 ENCOUNTER — Telehealth: Payer: Self-pay | Admitting: Internal Medicine

## 2019-06-19 NOTE — Progress Notes (Signed)
  Chronic Care Management   Outreach Note  06/19/2019 Name: LACI FRENKEL MRN: [id number] DOB: 1950/05/03  Referred by: Hoyt Koch, MD Reason for referral : No chief complaint on file.   An unsuccessful telephone outreach was attempted today. The patient was referred to the pharmacist for assistance with care management and care coordination. This note is not being shared with the patient for the following reason: To respect privacy (The patient or proxy has requested that the information not be shared).  Follow Up Plan:   Earney [place] [facility]stream Scheduler

## 2019-06-29 ENCOUNTER — Other Ambulatory Visit: Payer: Self-pay

## 2019-06-29 ENCOUNTER — Other Ambulatory Visit: Payer: Self-pay | Admitting: Allergy

## 2019-06-29 NOTE — Telephone Encounter (Signed)
Left message for pt. That we sent in a courtesy refill for montelukast 10 mg. No more refills until you are seen. Please call office to schedule your yearly appointment.

## 2019-07-02 ENCOUNTER — Ambulatory Visit: Payer: Medicare Other | Admitting: Family Medicine

## 2019-07-03 NOTE — Telephone Encounter (Signed)
Left pt. A message that we did send in a courtesy refill for her montelukast 10 mg and no more refills until appointment is made and kept.

## 2019-07-11 ENCOUNTER — Other Ambulatory Visit: Payer: Self-pay | Admitting: Internal Medicine

## 2019-07-14 ENCOUNTER — Ambulatory Visit: Payer: Medicare Other | Admitting: Family Medicine

## 2019-07-14 ENCOUNTER — Telehealth: Payer: Self-pay | Admitting: Internal Medicine

## 2019-07-14 ENCOUNTER — Other Ambulatory Visit: Payer: Self-pay | Admitting: Family Medicine

## 2019-07-14 MED ORDER — DOXYCYCLINE HYCLATE 100 MG PO TABS: ORAL_TABLET | ORAL | 0 refills | Status: DC

## 2019-07-14 NOTE — Telephone Encounter (Signed)
This may be a viral bronchitis, in which case it should clear- rest and adequate fluids. With her history, it would be ok to offer a script for doxycycline 100 mg, # 8, 2 today then one daily, to have on hand in case needed.

## 2019-07-14 NOTE — Telephone Encounter (Signed)
Patient aware of recommendations nothing further needed at this time.

## 2019-07-14 NOTE — Telephone Encounter (Signed)
Spoke with patient, she reports one week of cough with brown mucus.  Yesterday was her worst day, she spent all day in bed.  She has been using her incentive spirometer since her oxygen level dropped to 90%.  Her sats are now up to 93-94%.  She states she feels better today and wants to know if she is contagious and if she needs to be seen in the office.  Dr. Annamaria Boots, please advise.  Thank you.

## 2019-07-14 NOTE — Progress Notes (Deleted)
[facility] [place] Phone: [phone] Subjective:    I'm seeing this patient by the request  of:  Hoyt Koch, MD  CC:   [id number]   05/13/2018 Doing much better with the medications.  Discussed with patient about icing regimen, home exercise, which activities to do which wants to avoid.  Patient will continue the meloxicam and gabapentin.  Hopefully patient will have surgical intervention in the next 1 to 2 months.  Encourage patient to potentially consider increasing aquatic therapy if she gets up possibility as well.  Update 07/14/2019 Toni Hicks is a 69 y.o. female coming in with complaint of left hip pain. Ask if she needs refill to be submitted for meloxicam. Patient states  Onset-  Location Duration-  Character- Aggravating factors- Reliving factors-  Therapies tried-  Severity-     Past Medical History:  Diagnosis Date  . ADD (attention deficit disorder)   . Adult acne   . Alcohol abuse   . Allergic rhinitis   . Anxiety and depression   . Asthma   . Congenital pneumonia   . Eczema   . Hypoglycemia    no longer per pt   . Hypothyroidism   . Impaired fasting blood sugar   . Mycoplasma pneumonia   . Restless leg syndrome   . Seasonal allergies   . Stroke ([place])   . Urticaria    Past Surgical History:  Procedure Laterality Date  . DILATION AND CURETTAGE OF UTERUS     @ 69 years old  . TOTAL HIP ARTHROPLASTY Left 07/29/2018   Procedure: Left Anterior Hip Arthroplasty;  Surgeon: Melrose Nakayama, MD;  Location: [facility];  Service: Orthopedics;  Laterality: Left;  . WISDOM TOOTH EXTRACTION     Social History   Socioeconomic History  . Marital status: Widowed    Spouse name: has boyfriend Sam  . Number of children: 2  . Years of education: 56  . Highest education level: Some college, no degree  Occupational History  . Occupation: Clinical biochemist    Comment: Semi-retired    Tobacco Use  . Smoking status: Former Smoker    Packs/day: 2.00    Years: 15.00    Pack years: 30.00    Types: Cigarettes    Quit date: 1980    Years since quitting: 41.5  . Smokeless tobacco: Never Used  Vaping Use  . Vaping Use: Former  Substance and Sexual Activity  . Alcohol use: Yes    Alcohol/week: 0.0 standard drinks    Comment: 3-5 glasses of wine day   . Drug use: No  . Sexual activity: Not on file  Other Topics Concern  . Not on file  Social History Narrative   HSG, [employer] MA- photography   Married '73- 10 years divorced, married '86- 45 years divorced, Married '96- 63 years- widowed.   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 Difficulty of Paying Living Expenses:   Food Insecurity:   . Worried About Charity fundraiser in the Last Year:   . Arboriculturist in the Last Year:   Transportation Needs:   . Film/video editor (Medical):   [name] Kitchen Lack of Transportation (Non-Medical):   Physical Activity:   . Days of Exercise per Week:   . Minutes of Exercise  per Session:   Stress:   . Feeling of Stress :   Social Connections:   . Frequency of Communication with Friends and Family:   . Frequency of Social Gatherings with Friends and Family:   . Attends Religious Services:   . Active Member of Clubs or Organizations:   . Attends Archivist Meetings:   [name] Kitchen Marital Status:    Allergies  Allergen Reactions  . Other     Can not eat carbohydrates without protein, is allergic to certain foods but can take in certain doses   Family History  Problem Relation Age of Onset  . COPD Father   . Heart failure Father   . Coronary artery disease Father   . Cancer Mother        breast  . Hypertension Mother   . Dementia Mother   . Stroke Mother   . Breast cancer Mother      Current Outpatient Medications  (Cardiovascular):  .  amLODipine (NORVASC) 5 MG tablet, Take 1 tablet (5 mg total) by mouth daily. **APPOINTMENT NEEDED FOR ADDITIONAL REFILLS**  Current Outpatient Medications (Respiratory):  .  albuterol (VENTOLIN HFA) 108 (90 Base) MCG/ACT inhaler, INHALE 2 PUFFS EVERY 6 HOURS AS NEEDED FOR WHEEZING OR SHORTNESS OF BREATH. [name] Kitchen  Fluticasone-Umeclidin-Vilant (TRELEGY ELLIPTA) 100-62.5-25 MCG/INH AEPB, Take 1 puff by mouth daily. Rinse mouth .  montelukast (SINGULAIR) 10 MG tablet, TAKE 1 TABLET BY MOUTH EVERYDAY AT BEDTIME  Current Outpatient Medications (Analgesics):  .  acetaminophen (TYLENOL) 500 MG tablet, Take 500 mg by mouth 3 (three) times daily. .  meloxicam (MOBIC) 15 MG tablet, Take 1 tablet (15 mg total) by mouth daily.   Current Outpatient Medications (Other):  .  b complex vitamins tablet, Take 1 tablet by mouth 3 (three) times a week.  .  escitalopram (LEXAPRO) 20 MG tablet, Take 20 mg by mouth daily. .  temazepam (RESTORIL) 15 MG capsule, Take 15 mg by mouth at bedtime as needed.   Reviewed prior external information including notes and imaging from  primary care provider As well as notes that were available from care everywhere and other healthcare systems.  Past medical history, social, surgical and family history all reviewed in electronic medical record.  No pertanent information unless stated regarding to the chief complaint.   Review of Systems:  No headache, visual changes, nausea, vomiting, diarrhea, constipation, dizziness, abdominal pain, skin rash, fevers, chills, night sweats, weight loss, swollen lymph nodes, body aches, joint swelling, chest pain, shortness of breath, mood changes. POSITIVE muscle aches  Objective  There were no vitals taken for this visit.   General: No apparent distress alert and oriented x3 mood and affect normal, dressed appropriately.  HEENT: Pupils equal, extraocular movements intact  Respiratory: Patient's speak in full sentences  and does not appear short of breath  Cardiovascular: No lower extremity edema, non tender, no erythema  Neuro: Cranial nerves II through XII are intact, neurovascularly intact in all extremities with 2+ DTRs and 2+ pulses.  Gait normal with good balance and coordination.  MSK:  Non tender with full range of motion and good stability and symmetric strength and tone of shoulders, elbows, wrist, hip, knee and ankles bilaterally.     Impression and Recommendations:     The above documentation has been reviewed and is accurate and complete Toni Pulley, DO       Note: This dictation was prepared with Dragon dictation along with smaller phrase technology. Any transcriptional errors that  result from this process are unintentional.

## 2019-07-22 ENCOUNTER — Other Ambulatory Visit: Payer: Self-pay | Admitting: Allergy

## 2019-07-24 ENCOUNTER — Telehealth: Payer: Self-pay | Admitting: Internal Medicine

## 2019-07-24 ENCOUNTER — Other Ambulatory Visit: Payer: Self-pay | Admitting: Internal Medicine

## 2019-07-24 MED ORDER — ALBUTEROL SULFATE HFA 108 (90 BASE) MCG/ACT IN AERS: INHALATION_SPRAY | RESPIRATORY_TRACT | 3 refills | Status: DC

## 2019-07-24 NOTE — Telephone Encounter (Signed)
Rx for ventolin inhaler has been sent to pharmacy for pt. Attempted to call pt to let her know this had been done but unable to reach. Left pt a detailed message letting her know that the Rx was sent. Nothing further needed.

## 2019-07-27 ENCOUNTER — Ambulatory Visit: Payer: Medicare Other | Admitting: Adult Health

## 2019-07-27 ENCOUNTER — Encounter: Payer: Self-pay | Admitting: Adult Health

## 2019-07-27 VITALS — BP 137/85 | HR 81 | Ht 61.5 in | Wt 196.2 lb

## 2019-07-27 DIAGNOSIS — G4733 Obstructive sleep apnea (adult) (pediatric): Secondary | ICD-10-CM

## 2019-07-27 DIAGNOSIS — Z9989 Dependence on other enabling machines and devices: Secondary | ICD-10-CM | POA: Diagnosis not present

## 2019-07-27 NOTE — Patient Instructions (Signed)
Continue using CPAP nightly and greater than 4 hours each night °If your symptoms worsen or you develop new symptoms please let us know.  ° °

## 2019-07-27 NOTE — Progress Notes (Signed)
PATIENT: Toni Hicks DOB: 1950/03/27  REASON FOR VISIT: follow up HISTORY FROM: patient  HISTORY OF PRESENT ILLNESS: Today 07/27/19:  Toni Hicks is a 69 year old female with a history of obstructive sleep apnea on CPAP.  Her download indicates that she use her machine nightly for compliance of 100%.  She use her machine greater than 4 hours 29 days for compliance of 97%.  On average she uses her machine 9 hours and 29 minutes.  Her residual AHI is 1.8 on 9 cm of water with EPR of 1.  Leak in the 95th percentile is 9.4 L/min.  Reports that she is seeing a psychiatrist who has been managing her sleep.  She was recently switched to lorazepam which she feels has been beneficial but does give her hangover effect the next morning.  She also reports that she sometimes gets muscle spasms that starts in the feet and travels up the body.  She states that this has been ongoing for quite some time.  She states that it tends to get worse when she is around her boyfriend.  She has never had a formal work-up.  She returns today for an evaluation.  HISTORY (Copied from Dr.Dohmeier's note)  Toni Hicks is a 69 y.o. female , interval history on 07-22-2018,  The patient endorsed 10/ 24 points on her ESS, Geriatric  Depression score - 3/ 15.    See has intermittently to accept her CPAP machine also sleeps better with it, she likes to keep the covers of her bed over her face which allows her to sleep even when the bedroom is not fully dog.  Her compliance report looks excellent 100% compliance for the last 30 days with the final date of 20 July 2018.  Average of 7 hours 24 minutes, CPAP is set at 9 cmH2O pressure was 1 cm expiratory pressure today.  AHI is 0.6/h.  No central apneas emerging at this time.  Moderate air leakage. She still goes to the  bathroom every 1-2 hours at night. She still uses a fit bit-  She is approaching 6 hours of nightly sleep, but still reports insomnia.  She is having a hip  surgery next week. Gabapentin was prescribed by her orthopedist and she feels " it messes her up". She had seen cognitive behaviour therapy- 2 times a month with [facility]. She is wanting to continue on zoom.     She was last seen on 09-23-2017 in a RV.  She was seen here as in a referral after she suffered a stroke, and has seen Dr Krista Blue and later Dr. Jaynee Eagles, who iniated this referral. Mrs. Kawahara had suffered a stroke but not within the last 6 months.  She has originally seen pulmonologist Dr. Annamaria Boots, and then most recently Dr. Sarina Ill REVIEW OF SYSTEMS: Out of a complete 14 system review of symptoms, the patient complains only of the following symptoms, and all other reviewed systems are negative.  FSS 49 ESS 5  ALLERGIES: Allergies  Allergen Reactions  . Other     Can not eat carbohydrates without protein, is allergic to certain foods but can take in certain doses    HOME MEDICATIONS: Outpatient Medications Prior to Visit  Medication Sig Dispense Refill  . acetaminophen (TYLENOL) 500 MG tablet Take 500 mg by mouth 3 (three) times daily.    [name] Kitchen albuterol (VENTOLIN HFA) 108 (90 Base) MCG/ACT inhaler INHALE 2 PUFFS EVERY 6 HOURS AS NEEDED FOR WHEEZING OR SHORTNESS OF BREATH. 18  g 3  . amLODipine (NORVASC) 5 MG tablet Take 1 tablet (5 mg total) by mouth daily. **APPOINTMENT NEEDED FOR ADDITIONAL REFILLS** 90 tablet 0  . b complex vitamins tablet Take 1 tablet by mouth 3 (three) times a week.     . escitalopram (LEXAPRO) 20 MG tablet Take 20 mg by mouth daily.    . Fluticasone-Umeclidin-Vilant (TRELEGY ELLIPTA) 100-62.5-25 MCG/INH AEPB Take 1 puff by mouth daily. Rinse mouth 180 each 4  . LORazepam (ATIVAN) 0.5 MG tablet Take 0.25 mg by mouth at bedtime as needed.    . meloxicam (MOBIC) 15 MG tablet TAKE 1 TABLET BY MOUTH EVERY DAY 30 tablet 0  . montelukast (SINGULAIR) 10 MG tablet TAKE 1 TABLET BY MOUTH EVERYDAY AT BEDTIME 30 tablet 0  . doxycycline (VIBRA-TABS) 100 MG tablet Take 2  tabs by mouth  Today. Then one tablet daily till finished. 8 tablet 0  . temazepam (RESTORIL) 15 MG capsule Take 15 mg by mouth at bedtime as needed.     No facility-administered medications prior to visit.    PAST MEDICAL HISTORY: Past Medical History:  Diagnosis Date  . ADD (attention deficit disorder)   . Adult acne   . Alcohol abuse   . Allergic rhinitis   . Anxiety and depression   . Asthma   . Congenital pneumonia   . Eczema   . Hypoglycemia    no longer per pt   . Hypothyroidism   . Impaired fasting blood sugar   . Mycoplasma pneumonia   . Restless leg syndrome   . Seasonal allergies   . Stroke ([place])   . Urticaria     PAST SURGICAL HISTORY: Past Surgical History:  Procedure Laterality Date  . DILATION AND CURETTAGE OF UTERUS     @ 69 years old  . TOTAL HIP ARTHROPLASTY Left 07/29/2018   Procedure: Left Anterior Hip Arthroplasty;  Surgeon: Melrose Nakayama, MD;  Location: [facility];  Service: Orthopedics;  Laterality: Left;  . WISDOM TOOTH EXTRACTION      FAMILY HISTORY: Family History  Problem Relation Age of Onset  . COPD Father   . Heart failure Father   . Coronary artery disease Father   . Cancer Mother        breast  . Hypertension Mother   . Dementia Mother   . Stroke Mother   . Breast cancer Mother     SOCIAL HISTORY: Social History   Socioeconomic History  . Marital status: Widowed    Spouse name: has boyfriend Sam  . Number of children: 2  . Years of education: 22  . Highest education level: Some college, no degree  Occupational History  . Occupation: Clinical biochemist    Comment: Semi-retired  Tobacco Use  . Smoking status: Former Smoker    Packs/day: 2.00    Years: 15.00    Pack years: 30.00    Types: Cigarettes    Quit date: 1980    Years since quitting: 41.5  . Smokeless tobacco: Never Used  Vaping Use  . Vaping Use: Former  Substance and Sexual Activity  . Alcohol use: Yes    Alcohol/week: 0.0 standard drinks    Comment:  3-5 glasses of wine day   . Drug use: No  . Sexual activity: Not on file  Other Topics Concern  . Not on file  Social History Narrative   HSG, [employer] MA- photography   Married '73- 10 years divorced, married '86- 25 years divorced, Married '96-  10 years- widowed.   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 Difficulty of Paying Living Expenses:   Food Insecurity:   . Worried About Charity fundraiser in the Last Year:   . Arboriculturist in the Last Year:   Transportation Needs:   . Film/video editor (Medical):   [name] Kitchen Lack of Transportation (Non-Medical):   Physical Activity:   . Days of Exercise per Week:   . Minutes of Exercise per Session:   Stress:   . Feeling of Stress :   Social Connections:   . Frequency of Communication with Friends and Family:   . Frequency of Social Gatherings with Friends and Family:   . Attends Religious Services:   . Active Member of Clubs or Organizations:   . Attends Archivist Meetings:   [name] Kitchen Marital Status:   Intimate Partner Violence:   . Fear of Current or Ex-Partner:   . Emotionally Abused:   [name] Kitchen Physically Abused:   . Sexually Abused:       PHYSICAL EXAM  Vitals:   07/27/19 1135  BP: 137/85  Pulse: 81  Weight: 196 lb 3.2 oz (89 kg)  Height: 5' 1.5" (1.562 m)   Body mass index is 36.47 kg/m.  Generalized: Well developed, in no acute distress  Chest: Lungs clear to auscultation bilaterally  Neurological examination  Mentation: Alert oriented to time, place, history taking. Follows all commands speech and language fluent Cranial nerve II-XII: Extraocular movements were full, visual field were full on confrontational test Head turning and shoulder shrug  were normal and symmetric. Motor: The motor testing reveals 5 over 5 strength of all 4 extremities. Good symmetric motor  tone is noted throughout.  Sensory: Sensory testing is intact to soft touch on all 4 extremities. No evidence of extinction is noted.  Gait and station: Gait is normal.    DIAGNOSTIC DATA (LABS, IMAGING, TESTING) - I reviewed patient records, labs, notes, testing and imaging myself where available.  Lab Results  Component Value Date   WBC 8.2 05/22/2019   HGB 14.6 05/22/2019   HCT 43.1 05/22/2019   MCV 87.6 05/22/2019   PLT 239.0 05/22/2019      Component Value Date/Time   NA 140 07/25/2018 1627   NA 140 11/28/2017 1037   K 4.2 07/25/2018 1627   CL 104 07/25/2018 1627   CO2 26 07/25/2018 1627   GLUCOSE 101 (H) 07/25/2018 1627   BUN 17 07/25/2018 1627   BUN 10 11/28/2017 1037   CREATININE 0.64 07/25/2018 1627   CALCIUM 9.6 07/25/2018 1627   PROT 7.6 02/11/2018 1411   PROT 7.4 11/28/2017 1037   ALBUMIN 4.6 02/11/2018 1411   ALBUMIN 4.9 (H) 11/28/2017 1037   AST 24 02/11/2018 1411   ALT 22 02/11/2018 1411   ALKPHOS 89 02/11/2018 1411   BILITOT 0.5 02/11/2018 1411   BILITOT 0.4 11/28/2017 1037   GFRNONAA >60 07/25/2018 1627   GFRAA >60 07/25/2018 1627   Lab Results  Component Value Date   CHOL 162 02/11/2018   HDL 61.20 02/11/2018   LDLCALC 72 02/11/2018   TRIG 142.0 02/11/2018   CHOLHDL 3 02/11/2018   Lab Results  Component Value Date   HGBA1C 5.9 (H) 07/25/2018   Lab Results  Component Value Date   VITAMINB12 517 12/16/2015   Lab Results  Component  Value Date   TSH 1.03 12/07/2010      ASSESSMENT AND PLAN 69 y.o. year old female  has a past medical history of ADD (attention deficit disorder), Adult acne, Alcohol abuse, Allergic rhinitis, Anxiety and depression, Asthma, Congenital pneumonia, Eczema, Hypoglycemia, Hypothyroidism, Impaired fasting blood sugar, Mycoplasma pneumonia, Restless leg syndrome, Seasonal allergies, Stroke ([place]), and Urticaria. here with:  1. OSA on CPAP  - CPAP compliance excellent - Good treatment of AHI  - Encourage patient  to use CPAP nightly and > 4 hours each night -In regards to muscle spasms advised that she should start with a work-up with her PCP. - F/U in 1 year or sooner if needed   I spent 20 minutes of face-to-face and non-face-to-face time with patient.  This included previsit chart review, lab review, study review, order entry, electronic health record documentation, patient education.  Ward Givens, MSN, NP-C 07/27/2019, 12:44 PM [facility] [address] [place], [place] [address] [phone]

## 2019-07-28 ENCOUNTER — Telehealth: Payer: Self-pay | Admitting: Internal Medicine

## 2019-07-28 NOTE — Telephone Encounter (Signed)
Offer preednisone 10 mg, # 20, 4 X 2 DAYS, 3 X 2 DAYS, 2 X 2 DAYS, 1 X 2 DAYS  Ok to go ahead and take her antibiotic  She was to have made an appointment for 6 months after our last ov, but none is listed. Suggest we see her in next month or so.

## 2019-07-28 NOTE — Telephone Encounter (Signed)
Spoke with the pt  She is c/o increased SOB, wheezing, chest tightness, cough with brown to off white sputum x 1 month  She had called on 07/14/19 and was advised that she could have doxy 100 #8 2 today then 1 today  She states that she did not take this b/c she was told not to take unless she started having fever and she has not had this  I do not see where this was documented  Please advise thanks  Allergies  Allergen Reactions  . Other     Can not eat carbohydrates without protein, is allergic to certain foods but can take in certain doses   Current Outpatient Medications on File Prior to Visit  Medication Sig Dispense Refill  . acetaminophen (TYLENOL) 500 MG tablet Take 500 mg by mouth 3 (three) times daily.    [name] Kitchen albuterol (VENTOLIN HFA) 108 (90 Base) MCG/ACT inhaler INHALE 2 PUFFS EVERY 6 HOURS AS NEEDED FOR WHEEZING OR SHORTNESS OF BREATH. 18 g 3  . amLODipine (NORVASC) 5 MG tablet Take 1 tablet (5 mg total) by mouth daily. **APPOINTMENT NEEDED FOR ADDITIONAL REFILLS** 90 tablet 0  . b complex vitamins tablet Take 1 tablet by mouth 3 (three) times a week.     . escitalopram (LEXAPRO) 20 MG tablet Take 20 mg by mouth daily.    . Fluticasone-Umeclidin-Vilant (TRELEGY ELLIPTA) 100-62.5-25 MCG/INH AEPB Take 1 puff by mouth daily. Rinse mouth 180 each 4  . LORazepam (ATIVAN) 0.5 MG tablet Take 0.25 mg by mouth at bedtime as needed.    . meloxicam (MOBIC) 15 MG tablet TAKE 1 TABLET BY MOUTH EVERY DAY 30 tablet 0  . montelukast (SINGULAIR) 10 MG tablet TAKE 1 TABLET BY MOUTH EVERYDAY AT BEDTIME 30 tablet 0   No current facility-administered medications on file prior to visit.

## 2019-07-29 MED ORDER — PREDNISONE 10 MG PO TABS
ORAL_TABLET | ORAL | 0 refills | Status: DC
Start: 2019-07-29 — End: 2019-09-16

## 2019-07-29 MED ORDER — PREDNISONE 10 MG PO TABS
ORAL_TABLET | ORAL | 0 refills | Status: DC
Start: 2019-07-29 — End: 2019-07-29

## 2019-07-29 NOTE — Telephone Encounter (Signed)
Patient aware of recommendations and order sent and Appointment made nothing further is needed at this time.

## 2019-08-05 ENCOUNTER — Ambulatory Visit: Payer: Medicare Other | Admitting: Family Medicine

## 2019-08-05 NOTE — Progress Notes (Deleted)
[place] Phone: [phone] Subjective:    I'm seeing this patient by the request  of:  Hoyt Koch, MD  CC:   [id number]   07/07/2019 Patient given injection today and tolerated the procedure well.  We discussed icing regimen and home exercise.  Discussed the patient could do viscosupplementation if having worsening discomfort.  I do believe that patient himself unfortunately worsening of the left hip and needs to have that replaced which he was scheduled in 1 month.  Patient will see me again 1 month after surgical intervention for the hip  Update 08/05/2019 Toni Hicks is a 69 y.o. female coming in with complaint of right knee pain. Patient states       Past Medical History:  Diagnosis Date  . ADD (attention deficit disorder)   . Adult acne   . Alcohol abuse   . Allergic rhinitis   . Anxiety and depression   . Asthma   . Congenital pneumonia   . Eczema   . Hypoglycemia    no longer per pt   . Hypothyroidism   . Impaired fasting blood sugar   . Mycoplasma pneumonia   . Restless leg syndrome   . Seasonal allergies   . Stroke ([place])   . Urticaria    Past Surgical History:  Procedure Laterality Date  . DILATION AND CURETTAGE OF UTERUS     @ 69 years old  . TOTAL HIP ARTHROPLASTY Left 07/29/2018   Procedure: Left Anterior Hip Arthroplasty;  Surgeon: Melrose Nakayama, MD;  Location: [facility];  Service: Orthopedics;  Laterality: Left;  . WISDOM TOOTH EXTRACTION     Social History   Socioeconomic History  . Marital status: Widowed    Spouse name: has boyfriend Sam  . Number of children: 2  . Years of education: 21  . Highest education level: Some college, no degree  Occupational History  . Occupation: Clinical biochemist    Comment: Semi-retired  Tobacco Use  . Smoking status: Former Smoker    Packs/day: 2.00    Years: 15.00    Pack years: 30.00    Types: Cigarettes    Quit  date: 1980    Years since quitting: 41.6  . Smokeless tobacco: Never Used  Vaping Use  . Vaping Use: Former  Substance and Sexual Activity  . Alcohol use: Yes    Alcohol/week: 0.0 standard drinks    Comment: 3-5 glasses of wine day   . Drug use: No  . Sexual activity: Not on file  Other Topics Concern  . Not on file  Social History Narrative   HSG, [employer] MA- photography   Married '73- 10 years divorced, married '86- 30 years divorced, Married '96- 30 years- widowed.   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 Difficulty of Paying Living Expenses:   Food Insecurity:   . Worried About Charity fundraiser in the Last Year:   . Arboriculturist in the Last Year:   Transportation Needs:   . Film/video editor (Medical):   [name] Kitchen Lack of Transportation (Non-Medical):   Physical Activity:   . Days of Exercise per Week:   . Minutes of Exercise per Session:   Stress:   . Feeling of Stress :   Social Connections:   .  Frequency of Communication with Friends and Family:   . Frequency of Social Gatherings with Friends and Family:   . Attends Religious Services:   . Active Member of Clubs or Organizations:   . Attends Archivist Meetings:   [name] Kitchen Marital Status:    Allergies  Allergen Reactions  . Other     Can not eat carbohydrates without protein, is allergic to certain foods but can take in certain doses   Family History  Problem Relation Age of Onset  . COPD Father   . Heart failure Father   . Coronary artery disease Father   . Cancer Mother        breast  . Hypertension Mother   . Dementia Mother   . Stroke Mother   . Breast cancer Mother     Current Outpatient Medications (Endocrine & Metabolic):  .  predniSONE (DELTASONE) 10 MG tablet, 4tabs for 2,3 X 2 DAYS,2 X 2 DAYS,1 X 2 DAYS  Current Outpatient Medications  (Cardiovascular):  .  amLODipine (NORVASC) 5 MG tablet, Take 1 tablet (5 mg total) by mouth daily. **APPOINTMENT NEEDED FOR ADDITIONAL REFILLS**  Current Outpatient Medications (Respiratory):  .  albuterol (VENTOLIN HFA) 108 (90 Base) MCG/ACT inhaler, INHALE 2 PUFFS EVERY 6 HOURS AS NEEDED FOR WHEEZING OR SHORTNESS OF BREATH. [name] Kitchen  Fluticasone-Umeclidin-Vilant (TRELEGY ELLIPTA) 100-62.5-25 MCG/INH AEPB, Take 1 puff by mouth daily. Rinse mouth .  montelukast (SINGULAIR) 10 MG tablet, TAKE 1 TABLET BY MOUTH EVERYDAY AT BEDTIME  Current Outpatient Medications (Analgesics):  .  acetaminophen (TYLENOL) 500 MG tablet, Take 500 mg by mouth 3 (three) times daily. .  meloxicam (MOBIC) 15 MG tablet, TAKE 1 TABLET BY MOUTH EVERY DAY   Current Outpatient Medications (Other):  .  b complex vitamins tablet, Take 1 tablet by mouth 3 (three) times a week.  .  escitalopram (LEXAPRO) 20 MG tablet, Take 20 mg by mouth daily. [name] Kitchen  LORazepam (ATIVAN) 0.5 MG tablet, Take 0.25 mg by mouth at bedtime as needed.   Reviewed prior external information including notes and imaging from  primary care provider As well as notes that were available from care everywhere and other healthcare systems.  Past medical history, social, surgical and family history all reviewed in electronic medical record.  No pertanent information unless stated regarding to the chief complaint.   Review of Systems:  No headache, visual changes, nausea, vomiting, diarrhea, constipation, dizziness, abdominal pain, skin rash, fevers, chills, night sweats, weight loss, swollen lymph nodes, body aches, joint swelling, chest pain, shortness of breath, mood changes. POSITIVE muscle aches  Objective  There were no vitals taken for this visit.   General: No apparent distress alert and oriented x3 mood and affect normal, dressed appropriately.  HEENT: Pupils equal, extraocular movements intact  Respiratory: Patient's speak in full sentences and does not  appear short of breath  Cardiovascular: No lower extremity edema, non tender, no erythema  Neuro: Cranial nerves II through XII are intact, neurovascularly intact in all extremities with 2+ DTRs and 2+ pulses.  Gait normal with good balance and coordination.  MSK:  Non tender with full range of motion and good stability and symmetric strength and tone of shoulders, elbows, wrist, hip, knee and ankles bilaterally.     Impression and Recommendations:     The above documentation has been reviewed and is accurate and complete Jacqualin Combes       Note: This dictation was prepared with Dragon dictation along with smaller phrase technology. Any  transcriptional errors that result from this process are unintentional.

## 2019-08-06 ENCOUNTER — Other Ambulatory Visit: Payer: Self-pay

## 2019-08-06 ENCOUNTER — Ambulatory Visit (INDEPENDENT_AMBULATORY_CARE_PROVIDER_SITE_OTHER): Payer: Medicare Other | Admitting: Family Medicine

## 2019-08-06 ENCOUNTER — Encounter: Payer: Self-pay | Admitting: Family Medicine

## 2019-08-06 VITALS — BP 130/80 | HR 78 | Ht 61.0 in | Wt 196.0 lb

## 2019-08-06 DIAGNOSIS — M5136 Other intervertebral disc degeneration, lumbar region: Secondary | ICD-10-CM | POA: Diagnosis not present

## 2019-08-06 DIAGNOSIS — E559 Vitamin D deficiency, unspecified: Secondary | ICD-10-CM | POA: Diagnosis not present

## 2019-08-06 DIAGNOSIS — M2550 Pain in unspecified joint: Secondary | ICD-10-CM

## 2019-08-06 DIAGNOSIS — M51369 Other intervertebral disc degeneration, lumbar region without mention of lumbar back pain or lower extremity pain: Secondary | ICD-10-CM

## 2019-08-06 DIAGNOSIS — R634 Abnormal weight loss: Secondary | ICD-10-CM | POA: Diagnosis not present

## 2019-08-06 DIAGNOSIS — E66812 Obesity, class 2: Secondary | ICD-10-CM

## 2019-08-06 DIAGNOSIS — M170 Bilateral primary osteoarthritis of knee: Secondary | ICD-10-CM | POA: Diagnosis not present

## 2019-08-06 DIAGNOSIS — E669 Obesity, unspecified: Secondary | ICD-10-CM | POA: Insufficient documentation

## 2019-08-06 DIAGNOSIS — K529 Noninfective gastroenteritis and colitis, unspecified: Secondary | ICD-10-CM | POA: Diagnosis not present

## 2019-08-06 DIAGNOSIS — Z6837 Body mass index (BMI) 37.0-37.9, adult: Secondary | ICD-10-CM

## 2019-08-06 NOTE — Assessment & Plan Note (Signed)
Bilateral injections given today.  Tolerated the procedure well.  Due to patient having more of a polyarthralgia we will get laboratory work-up to see if anything else is contributing.  Patient has known degenerative disc disease of the lumbar spine.  Patient feels that gabapentin did cause weight gain so she wants to hold on this.  We will send patient to healthy weight and wellness to help with weight loss.  Follow-up with me again 4 to 8 weeks.  Could potentially be a candidate for viscosupplementation.

## 2019-08-06 NOTE — Progress Notes (Signed)
[place] [address] [place] Phone: [phone] Subjective:   I Toni Hicks am serving as a Education administrator for Dr. Hulan Saas.  This visit occurred during the SARS-CoV-2 public health emergency.  Safety protocols were in place, including screening questions prior to the visit, additional usage of staff PPE, and extensive cleaning of exam room while observing appropriate contact time as indicated for disinfecting solutions.   I'm seeing this patient by the request  of:  Hoyt Koch, MD  CC:  Knee pain and back pain [id number]   07/07/2018 Patient given injection today and tolerated the procedure well.  We discussed icing regimen and home exercise.  Discussed the patient could do viscosupplementation if having worsening discomfort.  I do believe that patient himself unfortunately worsening of the left hip and needs to have that replaced which he was scheduled in 1 month.  Patient will see me again 1 month after surgical intervention for the hip  08/06/2019 Toni Hicks is a 69 y.o. female coming in with complaint of left hip pain. Patient states the hip is doing great. bilateral knee pain. Right is worse with medial pain left is lateral pain. Scoliosis in the back (lower back pain) that is very painful. After lung xray she states she was told she has arthritis in her back. ADLs are effected by back and knee pain. New granddaughter is also causing back pain because she cant lift her. Right hand numbness in 2nd and 3rd finger. States it happened after stroke. Left hand finger stiffness as well. Taking meloxicam. States she has pain in multiple joints and has to take medication every day.  Pain isn't constant. 7-8/10 at its worse. Patient has had bronchitis for the last month.        Past Medical History:  Diagnosis Date  . ADD (attention deficit disorder)   . Adult acne   . Alcohol abuse   . Allergic rhinitis   . Anxiety and  depression   . Asthma   . Congenital pneumonia   . Eczema   . Hypoglycemia    no longer per pt   . Hypothyroidism   . Impaired fasting blood sugar   . Mycoplasma pneumonia   . Restless leg syndrome   . Seasonal allergies   . Stroke ([place])   . Urticaria    Past Surgical History:  Procedure Laterality Date  . DILATION AND CURETTAGE OF UTERUS     @ 69 years old  . TOTAL HIP ARTHROPLASTY Left 07/29/2018   Procedure: Left Anterior Hip Arthroplasty;  Surgeon: Melrose Nakayama, MD;  Location: [facility];  Service: Orthopedics;  Laterality: Left;  . WISDOM TOOTH EXTRACTION     Social History   Socioeconomic History  . Marital status: Widowed    Spouse name: has boyfriend Sam  . Number of children: 2  . Years of education: 88  . Highest education level: Some college, no degree  Occupational History  . Occupation: Clinical biochemist    Comment: Semi-retired  Tobacco Use  . Smoking status: Former Smoker    Packs/day: 2.00    Years: 15.00    Pack years: 30.00    Types: Cigarettes    Quit date: 1980    Years since quitting: 41.6  . Smokeless tobacco: Never Used  Vaping Use  . Vaping Use: Former  Substance and Sexual Activity  . Alcohol use: Yes    Alcohol/week: 0.0 standard drinks    Comment: 3-5  glasses of wine day   . Drug use: No  . Sexual activity: Not on file  Other Topics Concern  . Not on file  Social History Narrative   HSG, [employer] MA- photography   Married '73- 10 years divorced, married '86- 30 years divorced, Married '96- 55 years- widowed.   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 Difficulty of Paying Living Expenses:   Food Insecurity:   . Worried About Charity fundraiser in the Last Year:   . Arboriculturist in the Last Year:   Transportation Needs:   . Film/video editor (Medical):   [name] Kitchen Lack of  Transportation (Non-Medical):   Physical Activity:   . Days of Exercise per Week:   . Minutes of Exercise per Session:   Stress:   . Feeling of Stress :   Social Connections:   . Frequency of Communication with Friends and Family:   . Frequency of Social Gatherings with Friends and Family:   . Attends Religious Services:   . Active Member of Clubs or Organizations:   . Attends Archivist Meetings:   [name] Kitchen Marital Status:    Allergies  Allergen Reactions  . Other     Can not eat carbohydrates without protein, is allergic to certain foods but can take in certain doses   Family History  Problem Relation Age of Onset  . COPD Father   . Heart failure Father   . Coronary artery disease Father   . Cancer Mother        breast  . Hypertension Mother   . Dementia Mother   . Stroke Mother   . Breast cancer Mother     Current Outpatient Medications (Endocrine & Metabolic):  .  predniSONE (DELTASONE) 10 MG tablet, 4tabs for 2,3 X 2 DAYS,2 X 2 DAYS,1 X 2 DAYS  Current Outpatient Medications (Cardiovascular):  .  amLODipine (NORVASC) 5 MG tablet, Take 1 tablet (5 mg total) by mouth daily. **APPOINTMENT NEEDED FOR ADDITIONAL REFILLS**  Current Outpatient Medications (Respiratory):  .  albuterol (VENTOLIN HFA) 108 (90 Base) MCG/ACT inhaler, INHALE 2 PUFFS EVERY 6 HOURS AS NEEDED FOR WHEEZING OR SHORTNESS OF BREATH. [name] Kitchen  Fluticasone-Umeclidin-Vilant (TRELEGY ELLIPTA) 100-62.5-25 MCG/INH AEPB, Take 1 puff by mouth daily. Rinse mouth .  montelukast (SINGULAIR) 10 MG tablet, TAKE 1 TABLET BY MOUTH EVERYDAY AT BEDTIME  Current Outpatient Medications (Analgesics):  .  acetaminophen (TYLENOL) 500 MG tablet, Take 500 mg by mouth 3 (three) times daily. .  meloxicam (MOBIC) 15 MG tablet, TAKE 1 TABLET BY MOUTH EVERY DAY   Current Outpatient Medications (Other):  .  b complex vitamins tablet, Take 1 tablet by mouth 3 (three) times a week.  .  escitalopram (LEXAPRO) 20 MG tablet, Take 20 mg  by mouth daily. [name] Kitchen  LORazepam (ATIVAN) 0.5 MG tablet, Take 0.25 mg by mouth at bedtime as needed.   Reviewed prior external information including notes and imaging from  primary care provider As well as notes that were available from care everywhere and other healthcare systems.  Past medical history, social, surgical and family history all reviewed in electronic medical record.  No pertanent information unless stated regarding to the chief complaint.   Review of Systems:  No  visual changes, nausea, vomiting, diarrhea, constipation, dizziness, abdominal pain, skin rash, fevers, chills, night sweats,  weight loss, swollen lymph nodes,  chest pain, shortness of breath, mood changes. POSITIVE muscle aches, body aches, joint swelling, headaches  Objective  Blood pressure (!) 130/80, pulse 78, height 5\' 1"  (1.549 m), weight 196 lb (88.9 kg), SpO2 92 %.   General: No apparent distress alert and oriented x3 mood and affect normal, dressed appropriately.  Obese HEENT: Pupils equal, extraocular movements intact  Respiratory: Patient's speak in full sentences very mild shortness of breath at baseline Patient does have significant arthritic changes of multiple joints.  Patient has difficulty with ambulation secondary to tightness in the back as well as the knees. Low back exam does show some loss of lordosis.  Tightness with FABER test bilaterally right greater than left.  Negative straight leg test Knee: Bilateral valgus deformity noted.  Abnormal thigh to calf ratio.  Tender to palpation over medial and PF joint line.  ROM full in flexion and extension and lower leg rotation. instability with valgus force.  painful patellar compression. Patellar glide with moderate crepitus. Patellar and quadriceps tendons unremarkable. Hamstring and quadriceps strength is normal.  After informed written and verbal consent, patient was seated on exam table. Right knee was prepped with alcohol swab and utilizing  anterolateral approach, patient's right knee space was injected with 4:1  marcaine 0.5%: Kenalog 40mg /dL. Patient tolerated the procedure well without immediate complications.  After informed written and verbal consent, patient was seated on exam table. Left knee was prepped with alcohol swab and utilizing anterolateral approach, patient's left knee space was injected with 4:1  marcaine 0.5%: Kenalog 40mg /dL. Patient tolerated the procedure well without immediate complications.    Impression and Recommendations:     The above documentation has been reviewed and is accurate and complete Lyndal Pulley, DO       Note: This dictation was prepared with Dragon dictation along with smaller phrase technology. Any transcriptional errors that result from this process are unintentional.

## 2019-08-06 NOTE — Patient Instructions (Addendum)
Labs today Injected both knees Continue other meds for now  Exercises for the back 3 times a week Send a message in 2-3 weeks See me again in 4-6 weeks

## 2019-08-06 NOTE — Assessment & Plan Note (Signed)
Reviewed patient's previous x-rays do have known arthritic changes.  We discussed again gabapentin which patient declined secondary to the side effect of weight gain.  Do believe that the weight could be playing a role and will refer to healthy weight and wellness.  Laboratory work-up for polyarthralgia.  Meloxicam for breakthrough.  Follow-up again 4 to 8 weeks

## 2019-08-07 LAB — CBC WITH DIFFERENTIAL/PLATELET
Absolute Monocytes: 890 cells/uL (ref 200–950)
Basophils Absolute: 60 cells/uL (ref 0–200)
Basophils Relative: 0.6 %
Eosinophils Absolute: 50 cells/uL (ref 15–500)
Eosinophils Relative: 0.5 %
HCT: 43.6 % (ref 35.0–45.0)
Hemoglobin: 14.6 g/dL (ref 11.7–15.5)
Lymphs Abs: 1770 cells/uL (ref 850–3900)
MCH: 29.8 pg (ref 27.0–33.0)
MCHC: 33.5 g/dL (ref 32.0–36.0)
MCV: 89 fL (ref 80.0–100.0)
MPV: 10 fL (ref 7.5–12.5)
Monocytes Relative: 8.9 %
Neutro Abs: 7230 cells/uL (ref 1500–7800)
Neutrophils Relative %: 72.3 %
Platelets: 256 10*3/uL (ref 140–400)
RBC: 4.9 10*6/uL (ref 3.80–5.10)
RDW: 13.9 % (ref 11.0–15.0)
Total Lymphocyte: 17.7 %
WBC: 10 10*3/uL (ref 3.8–10.8)

## 2019-08-07 LAB — COMPREHENSIVE METABOLIC PANEL
AG Ratio: 1.6 (calc) (ref 1.0–2.5)
ALT: 30 U/L — ABNORMAL HIGH (ref 6–29)
AST: 27 U/L (ref 10–35)
Albumin: 4.4 g/dL (ref 3.6–5.1)
Alkaline phosphatase (APISO): 84 U/L (ref 37–153)
BUN: 16 mg/dL (ref 7–25)
CO2: 26 mmol/L (ref 20–32)
Calcium: 9.1 mg/dL (ref 8.6–10.4)
Chloride: 102 mmol/L (ref 98–110)
Creat: 0.65 mg/dL (ref 0.50–0.99)
Globulin: 2.7 g/dL (calc) (ref 1.9–3.7)
Glucose, Bld: 102 mg/dL — ABNORMAL HIGH (ref 65–99)
Potassium: 4.2 mmol/L (ref 3.5–5.3)
Sodium: 139 mmol/L (ref 135–146)
Total Bilirubin: 0.4 mg/dL (ref 0.2–1.2)
Total Protein: 7.1 g/dL (ref 6.1–8.1)

## 2019-08-07 LAB — C-REACTIVE PROTEIN: CRP: 3.9 mg/L (ref <8.0)

## 2019-08-07 LAB — TRANSFERRIN: Transferrin: 353 mg/dL — ABNORMAL HIGH (ref 188–341)

## 2019-08-07 LAB — TSH: TSH: 0.31 mIU/L — ABNORMAL LOW (ref 0.40–4.50)

## 2019-08-07 LAB — VITAMIN D 25 HYDROXY (VIT D DEFICIENCY, FRACTURES): Vit D, 25-Hydroxy: 13 ng/mL — ABNORMAL LOW (ref 30–100)

## 2019-08-07 LAB — SEDIMENTATION RATE: Sed Rate: 6 mm/h (ref 0–30)

## 2019-08-07 LAB — FERRITIN: Ferritin: 51 ng/mL (ref 16–288)

## 2019-08-16 ENCOUNTER — Other Ambulatory Visit: Payer: Self-pay | Admitting: Family Medicine

## 2019-08-18 DIAGNOSIS — R634 Abnormal weight loss: Secondary | ICD-10-CM | POA: Insufficient documentation

## 2019-08-24 DIAGNOSIS — G4733 Obstructive sleep apnea (adult) (pediatric): Secondary | ICD-10-CM | POA: Diagnosis not present

## 2019-09-09 ENCOUNTER — Ambulatory Visit: Payer: Medicare Other | Admitting: Family Medicine

## 2019-09-10 NOTE — Progress Notes (Signed)
[facility] [place] Phone: [phone] Subjective:   Toni Hicks, am serving as a scribe for Dr. Hulan Saas. This visit occurred during the SARS-CoV-2 public health emergency.  Safety protocols were in place, including screening questions prior to the visit, additional usage of staff PPE, and extensive cleaning of exam room while observing appropriate contact time as indicated for disinfecting solutions.   I'm seeing this patient by the request  of:  Hoyt Koch, MD  CC: Back and knee pain follow-up  VEL:[id number]   08/06/2019 Reviewed patient's previous x-rays do have known arthritic changes.  We discussed again gabapentin which patient declined secondary to the side effect of weight gain.  Do believe that the weight could be playing a role and will refer to healthy weight and wellness.  Laboratory work-up for polyarthralgia.  Meloxicam for breakthrough.  Follow-up again 4 to 8 weeks  Bilateral injections given today.  Tolerated the procedure well.  Due to patient having more of a polyarthralgia we will get laboratory work-up to see if anything else is contributing.  Patient has known degenerative disc disease of the lumbar spine.  Patient feels that gabapentin did cause weight gain so she wants to hold on this.  We will send patient to healthy weight and wellness to help with weight loss.  Follow-up with me again 4 to 8 weeks.  Could potentially be a candidate for viscosupplementation.   Update 09/10/2019 Toni Hicks is a 69 y.o. female coming in with complaint of low back and bilateral knee pain. Patient states that did have an increase in knee pain on right side but then pain went away. Injections did help both knees.  Approximately 80% better.  Not having as much swelling  Back pain is doing better since last visit. Has been mindful and doing stretches. Has been using Tylenol and Meloxicam.  Patient is making  some improvement with the mindful stretches.  Discussed icing regimen and home exercise, which activities to do which wants to avoid.  Patient should increase activity slowly.  Increase icing regimen.  Patient still wanting to avoid a lot of different medications, and does feel that the weight gain is likely contributing still at this time       Past Medical History:  Diagnosis Date  . ADD (attention deficit disorder)   . Adult acne   . Alcohol abuse   . Allergic rhinitis   . Anxiety and depression   . Asthma   . Congenital pneumonia   . Eczema   . Hypoglycemia    Hicks longer per pt   . Hypothyroidism   . Impaired fasting blood sugar   . Mycoplasma pneumonia   . Restless leg syndrome   . Seasonal allergies   . Stroke ([place])   . Urticaria    Past Surgical History:  Procedure Laterality Date  . DILATION AND CURETTAGE OF UTERUS     @ 69 years old  . TOTAL HIP ARTHROPLASTY Left 07/29/2018   Procedure: Left Anterior Hip Arthroplasty;  Surgeon: Melrose Nakayama, MD;  Location: [facility];  Service: Orthopedics;  Laterality: Left;  . WISDOM TOOTH EXTRACTION     Social History   Socioeconomic History  . Marital status: Widowed    Spouse name: has boyfriend Sam  . Number of children: 2  . Years of education: 35  . Highest education level: Some college, Hicks degree  Occupational History  . Occupation: Clinical biochemist  Comment: Semi-retired  Tobacco Use  . Smoking status: Former Smoker    Packs/day: 2.00    Years: 15.00    Pack years: 30.00    Types: Cigarettes    Quit date: 1980    Years since quitting: 41.7  . Smokeless tobacco: Never Used  Vaping Use  . Vaping Use: Former  Substance and Sexual Activity  . Alcohol use: Yes    Alcohol/week: 0.0 standard drinks    Comment: 3-5 glasses of wine day   . Drug use: Hicks  . Sexual activity: Not on file  Other Topics Concern  . Not on file  Social History Narrative   HSG, [employer] MA- photography   Married '73-  10 years divorced, married '86- 54 years divorced, Married '96- 62 years- widowed.   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 Difficulty of Paying Living Expenses: Not on file  Food Insecurity:   . Worried About Charity fundraiser in the Last Year: Not on file  . Ran Out of Food in the Last Year: Not on file  Transportation Needs:   . Lack of Transportation (Medical): Not on file  . Lack of Transportation (Non-Medical): Not on file  Physical Activity:   . Days of Exercise per Week: Not on file  . Minutes of Exercise per Session: Not on file  Stress:   . Feeling of Stress : Not on file  Social Connections:   . Frequency of Communication with Friends and Family: Not on file  . Frequency of Social Gatherings with Friends and Family: Not on file  . Attends Religious Services: Not on file  . Active Member of Clubs or Organizations: Not on file  . Attends Archivist Meetings: Not on file  . Marital Status: Not on file   Allergies  Allergen Reactions  . Other     Can not eat carbohydrates without protein, is allergic to certain foods but can take in certain doses   Family History  Problem Relation Age of Onset  . COPD Father   . Heart failure Father   . Coronary artery disease Father   . Cancer Mother        breast  . Hypertension Mother   . Dementia Mother   . Stroke Mother   . Breast cancer Mother     Current Outpatient Medications (Endocrine & Metabolic):  .  predniSONE (DELTASONE) 10 MG tablet, 4tabs for 2,3 X 2 DAYS,2 X 2 DAYS,1 X 2 DAYS  Current Outpatient Medications (Cardiovascular):  .  amLODipine (NORVASC) 5 MG tablet, Take 1 tablet (5 mg total) by mouth daily. **APPOINTMENT NEEDED FOR ADDITIONAL REFILLS**  Current Outpatient Medications (Respiratory):  .  albuterol (VENTOLIN HFA) 108 (90 Base) MCG/ACT inhaler, INHALE 2  PUFFS EVERY 6 HOURS AS NEEDED FOR WHEEZING OR SHORTNESS OF BREATH. [name] Kitchen  Fluticasone-Umeclidin-Vilant (TRELEGY ELLIPTA) 100-62.5-25 MCG/INH AEPB, Take 1 puff by mouth daily. Rinse mouth .  montelukast (SINGULAIR) 10 MG tablet, TAKE 1 TABLET BY MOUTH EVERYDAY AT BEDTIME  Current Outpatient Medications (Analgesics):  .  acetaminophen (TYLENOL) 500 MG tablet, Take 500 mg by mouth 3 (three) times daily. .  meloxicam (MOBIC) 15 MG tablet, TAKE 1 TABLET BY MOUTH EVERY DAY   Current Outpatient Medications (Other):  .  b complex vitamins tablet, Take 1 tablet by mouth 3 (three) times a  week.  .  escitalopram (LEXAPRO) 20 MG tablet, Take 20 mg by mouth daily. [name] Kitchen  LORazepam (ATIVAN) 0.5 MG tablet, Take 0.25 mg by mouth at bedtime as needed. .  Vitamin D, Ergocalciferol, (DRISDOL) 1.25 MG (50000 UNIT) CAPS capsule, Take 1 capsule (50,000 Units total) by mouth every 7 (seven) days.   Reviewed prior external information including notes and imaging from  primary care provider As well as notes that were available from care everywhere and other healthcare systems.  Past medical history, social, surgical and family history all reviewed in electronic medical record.  Hicks pertanent information unless stated regarding to the chief complaint.   Review of Systems:  Hicks headache, visual changes, nausea, vomiting, diarrhea, constipation, dizziness, abdominal pain, skin rash, fevers, chills, night sweats, weight loss, swollen lymph nodes,  chest pain, shortness of breath, mood changes. POSITIVE muscle aches, body aches, joint swelling  Objective  Blood pressure (!) 144/80, pulse 64, height 5\' 1"  (1.549 m), weight 201 lb (91.2 kg), SpO2 94 %.   General: Hicks apparent distress alert and oriented x3 mood and affect normal, dressed appropriately.  HEENT: Pupils equal, extraocular movements intact  Gait mild antalgic Patient is sitting comfortably, he negative straight leg test.  Significant decrease in effusion of the  knees but still some instability of the knees with valgus and varus force.  Tender to palpation of the medial joint line.   Impression and Recommendations:     The above documentation has been reviewed and is accurate and complete Lyndal Pulley, DO       Note: This dictation was prepared with Dragon dictation along with smaller phrase technology. Any transcriptional errors that result from this process are unintentional.

## 2019-09-11 ENCOUNTER — Encounter: Payer: Self-pay | Admitting: Family Medicine

## 2019-09-11 ENCOUNTER — Other Ambulatory Visit: Payer: Self-pay

## 2019-09-11 ENCOUNTER — Ambulatory Visit: Payer: Medicare Other | Admitting: Family Medicine

## 2019-09-11 VITALS — BP 144/80 | HR 64 | Ht 61.0 in | Wt 201.0 lb

## 2019-09-11 DIAGNOSIS — E559 Vitamin D deficiency, unspecified: Secondary | ICD-10-CM | POA: Insufficient documentation

## 2019-09-11 DIAGNOSIS — Z9114 Patient's other noncompliance with medication regimen: Secondary | ICD-10-CM | POA: Diagnosis not present

## 2019-09-11 DIAGNOSIS — M170 Bilateral primary osteoarthritis of knee: Secondary | ICD-10-CM | POA: Diagnosis not present

## 2019-09-11 DIAGNOSIS — Z91148 Patient's other noncompliance with medication regimen for other reason: Secondary | ICD-10-CM

## 2019-09-11 DIAGNOSIS — M51369 Other intervertebral disc degeneration, lumbar region without mention of lumbar back pain or lower extremity pain: Secondary | ICD-10-CM

## 2019-09-11 DIAGNOSIS — M5136 Other intervertebral disc degeneration, lumbar region: Secondary | ICD-10-CM

## 2019-09-11 MED ORDER — VITAMIN D (ERGOCALCIFEROL) 1.25 MG (50000 UNIT) PO CAPS: 50000.0000 [IU] | ORAL_CAPSULE | ORAL | 0 refills | Status: DC

## 2019-09-11 NOTE — Assessment & Plan Note (Signed)
Started prescription supplement

## 2019-09-11 NOTE — Assessment & Plan Note (Signed)
Significant arthritic changes.  Patient still does not want significant number medication secondary to weight gain.  Working with healthy weight and wellness.  Discussed some potential over-the-counter medications that could be beneficial, meloxicam for breakthrough pain.  Laboratory work-up did show vitamin D deficiency start once weekly vitamin D follow-up again in 2 months

## 2019-09-11 NOTE — Assessment & Plan Note (Signed)
Stable overall after the steroid injections.  Discussed the oral anti-inflammatories such as meloxicam we will hold on the viscosupplementation at the moment follow-up with me again in 6 to 8 weeks

## 2019-09-11 NOTE — Patient Instructions (Addendum)
Once weekly Vitamin D DHEA 50mg  for one month [facility] See me in 2 months

## 2019-09-16 ENCOUNTER — Ambulatory Visit (INDEPENDENT_AMBULATORY_CARE_PROVIDER_SITE_OTHER): Payer: Medicare Other

## 2019-09-16 ENCOUNTER — Encounter: Payer: Self-pay | Admitting: Pulmonary Disease

## 2019-09-16 ENCOUNTER — Other Ambulatory Visit: Payer: Self-pay

## 2019-09-16 ENCOUNTER — Ambulatory Visit: Payer: Medicare Other | Admitting: Pulmonary Disease

## 2019-09-16 VITALS — BP 124/64 | HR 75 | Temp 99.1°F | Ht 61.5 in | Wt 200.5 lb

## 2019-09-16 DIAGNOSIS — J339 Nasal polyp, unspecified: Secondary | ICD-10-CM | POA: Diagnosis not present

## 2019-09-16 DIAGNOSIS — Z9989 Dependence on other enabling machines and devices: Secondary | ICD-10-CM

## 2019-09-16 DIAGNOSIS — G4733 Obstructive sleep apnea (adult) (pediatric): Secondary | ICD-10-CM

## 2019-09-16 DIAGNOSIS — J449 Chronic obstructive pulmonary disease, unspecified: Secondary | ICD-10-CM

## 2019-09-16 DIAGNOSIS — R05 Cough: Secondary | ICD-10-CM | POA: Diagnosis not present

## 2019-09-16 DIAGNOSIS — J309 Allergic rhinitis, unspecified: Secondary | ICD-10-CM | POA: Diagnosis not present

## 2019-09-16 LAB — CBC WITH DIFFERENTIAL/PLATELET
Basophils Absolute: 0.1 10*3/uL (ref 0.0–0.1)
Basophils Relative: 0.8 % (ref 0.0–3.0)
Eosinophils Absolute: 0.2 10*3/uL (ref 0.0–0.7)
Eosinophils Relative: 2.4 % (ref 0.0–5.0)
HCT: 43.3 % (ref 36.0–46.0)
Hemoglobin: 14.4 g/dL (ref 12.0–15.0)
Lymphocytes Relative: 16.5 % (ref 12.0–46.0)
Lymphs Abs: 1.4 10*3/uL (ref 0.7–4.0)
MCHC: 33.2 g/dL (ref 30.0–36.0)
MCV: 90.8 fl (ref 78.0–100.0)
Monocytes Absolute: 1 10*3/uL (ref 0.1–1.0)
Monocytes Relative: 11.9 % (ref 3.0–12.0)
Neutro Abs: 5.9 10*3/uL (ref 1.4–7.7)
Neutrophils Relative %: 68.4 % (ref 43.0–77.0)
Platelets: 237 10*3/uL (ref 150.0–400.0)
RBC: 4.77 Mil/uL (ref 3.87–5.11)
RDW: 14.5 % (ref 11.5–15.5)
WBC: 8.6 10*3/uL (ref 4.0–10.5)

## 2019-09-16 MED ORDER — AMOXICILLIN-POT CLAVULANATE 875-125 MG PO TABS: 1.0000 | ORAL_TABLET | Freq: Two times a day (BID) | ORAL | 0 refills | Status: DC

## 2019-09-16 MED ORDER — PREDNISONE 10 MG PO TABS: ORAL_TABLET | ORAL | 0 refills | Status: DC

## 2019-09-16 NOTE — Progress Notes (Signed)
@Patient  ID: Toni Hicks, female    DOB: [birth year], 69 y.o.   MRN: [id number]  Chief Complaint  Patient presents with  . Follow-up    sick x several months.  no energy, productive cough with slightly brown sputum.     Referring provider: Hoyt Koch, *  HPI:  69 year old female former smoker followed in our office for obstructive sleep apnea, restless leg syndrome, asthma/copd  PMH: Restless legs, EtOH abuse, anxiety, depression, high blood pressure, hypothyroidism, status post CVA, aortic cardiac, type 2 diabetes Smoker/ Smoking History: Former smoker. Quit 1980, 30 pack year.  Maintenance: Trelegy Ellipta 100 Pt of: Dr. Annamaria Boots  09/16/2019  - Visit   69 year old female former smoker followed in our office for asthma, obstructive sleep apnea restless leg syndrome.  Patient completing a follow-up with our office today.  Patient was last seen back in May/2021 by Dr. Annamaria Boots.  Plan of care at that office visit was as follows: Continue follow-up with neurology for management of obstructive sleep apnea and restless leg syndrome.  Patient requested to be trialed off of Singulair.  Patient contact her office in July/2021 and was treated with a prednisone taper and doxycycline.  Patient reporting today that she is back on Singulair.  Patient is concerned because she is reporting that her oxygen levels have been ranging between 88 and 92% this is low for her.  Offered walk in office today and patient declined.  Patient reports adherence of using Trelegy Ellipta.  She is having to use her rescue inhaler 3-5 times a day for the last 2 to 3 weeks.  She was previously established with Dr. Ernesto Rutherford was a ear nose and throat provider, she is requesting a referral to establish with a new ear nose and throat provider is Dr. Ernesto Rutherford has retired.  She is also established with Dr. Nelva Bush with allergy asthma, she has not seen Dr. Nelva Bush in quite some time.  She is planning on scheduling  appointment.  Patient has been offered in the past starting biologic agent such as Dupixent.  She has declined them over concerns over there side effects.  Patient is very concerned because she wants her symptoms to be better over the next 2 weeks as she is planning on making a trip to [place] for a motor car race experience.  She continues to utilize her CPAP.  And shows excellent control.  See CPAP compliance were listed below:  08/15/2019-09/13/2019-CPAP compliance report-30 had a last 30 days use, all 30 those days greater than 4 hours, average usage 9 hours and 57 minutes, CPAP set pressure of 9, AHI 2.2  Patient reporting increased shortness of breath, fatigue, productive cough with discolored mucus.  She reports that she has had the symptoms for the last 2 months.  She feels that her breathing has worsened over the last 2 to 3 weeks.  She did not have improvement of symptoms of her mucus production or cough despite prednisone and doxycycline use.  Questionaires / Pulmonary Flowsheets:   ACT:  Asthma Control Test ACT Total Score  05/25/2019 16  01/29/2018 15    MMRC: No flowsheet data found.  Epworth:  Results of the Epworth flowsheet 05/26/2018  Sitting and reading 0  Watching TV 1  Sitting, inactive in a public place (e.g. a theatre or a meeting) 0  As a passenger in a car for an hour without a break 0  Lying down to rest in the afternoon when circumstances permit 3  Sitting  and talking to someone 0  Sitting quietly after a lunch without alcohol 1  In a car, while stopped for a few minutes in traffic 0  Total score 5    Tests:   NPSG Dohmeier 04/08/2017-  AHI 11.25/ hr, desaturation to 53%, 174 lbs Office Spirometry 03/20/2018 (Allergy and Asthma) Moderate obstruction, with FVC 1.84/ 70%, FEV1 1.09/ 54%, ratio 0.59/ 77%, FEF25-75% 0.44/ 24%. PFT- 05/22/19- Mod Obst, Mod restriction, Nl Diffusion, Insignif resp to BD   FENO:  No results found for: NITRICOXIDE  PFT: PFT  Results Latest Ref Rng & Units 05/22/2019  FVC-Pre L 1.83  FVC-Predicted Pre % 66  FVC-Post L 1.93  FVC-Predicted Post % 69  Pre FEV1/FVC % % 66  Post FEV1/FCV % % 69  FEV1-Pre L 1.20  FEV1-Predicted Pre % 57  FEV1-Post L 1.33  DLCO uncorrected ml/min/mmHg 16.40  DLCO UNC% % 91  DLVA Predicted % 111    WALK:  No flowsheet data found.  Imaging: No results found.  Lab Results:  CBC    Component Value Date/Time   WBC 10.0 08/06/2019 1054   RBC 4.90 08/06/2019 1054   HGB 14.6 08/06/2019 1054   HGB 15.7 11/28/2017 1037   HCT 43.6 08/06/2019 1054   HCT 45.5 11/28/2017 1037   PLT 256 08/06/2019 1054   PLT 289 11/28/2017 1037   MCV 89.0 08/06/2019 1054   MCV 88 11/28/2017 1037   MCH 29.8 08/06/2019 1054   MCHC 33.5 08/06/2019 1054   RDW 13.9 08/06/2019 1054   RDW 12.7 11/28/2017 1037   LYMPHSABS 1,770 08/06/2019 1054   LYMPHSABS 1.5 11/28/2017 1037   MONOABS 1.0 05/22/2019 1509   EOSABS 50 08/06/2019 1054   EOSABS 0.5 (H) 11/28/2017 1037   BASOSABS 60 08/06/2019 1054   BASOSABS 0.1 11/28/2017 1037    BMET    Component Value Date/Time   NA 139 08/06/2019 1054   NA 140 11/28/2017 1037   K 4.2 08/06/2019 1054   CL 102 08/06/2019 1054   CO2 26 08/06/2019 1054   GLUCOSE 102 (H) 08/06/2019 1054   BUN 16 08/06/2019 1054   BUN 10 11/28/2017 1037   CREATININE 0.65 08/06/2019 1054   CALCIUM 9.1 08/06/2019 1054   GFRNONAA >60 07/25/2018 1627   GFRAA >60 07/25/2018 1627    BNP No results found for: BNP  ProBNP No results found for: PROBNP  Specialty Problems      Pulmonary Problems   Allergic rhinitis due to allergen    Qualifier: Diagnosis of  By: Linda Hedges MD, Heinz Knuckles       Obstructive sleep apnea treated with continuous positive airway pressure (CPAP)    NPSG Dohmeier 04/08/2017-  AHI 11.25/ hr, desaturation to 53%, 174 lbs      Moderate persistent asthma, uncomplicated   Nasal polyposis   Nasal septal perforation   COPD mixed type ([place])    Office  Spirometry 03/20/2018 (Allergy and Asthma) Moderate obstruction, with FVC 1.84/ 70%, FEV1 1.09/ 54%, ratio 0.59/ 77%, FEF25-75% 0.44/ 24%. PFT- 05/22/19- Mod Obst, Mod restriction, Nl Diffusion, Insignif resp to BD         Allergies  Allergen Reactions  . Astelin [Azelastine] Other (See Comments)    Per patient caused psychiatric illness  . Nasonex [Mometasone] Other (See Comments)    Per patient caused psychiatric illness  . Other     Can not eat carbohydrates without protein, is allergic to certain foods but can take in certain doses  Immunization History  Administered Date(s) Administered  . PFIZER SARS-COV-2 Vaccination 02/22/2019, 03/17/2019  . Tdap 12/15/2015  . Zoster 12/15/2015    Past Medical History:  Diagnosis Date  . ADD (attention deficit disorder)   . Adult acne   . Alcohol abuse   . Allergic rhinitis   . Anxiety and depression   . Asthma   . Congenital pneumonia   . Eczema   . Hypoglycemia    no longer per pt   . Hypothyroidism   . Impaired fasting blood sugar   . Mycoplasma pneumonia   . Restless leg syndrome   . Seasonal allergies   . Stroke ([place])   . Urticaria     Tobacco History: Social History   Tobacco Use  Smoking Status Former Smoker  . Packs/day: 2.00  . Years: 15.00  . Pack years: 30.00  . Types: Cigarettes  . Quit date: 70  . Years since quitting: 41.7  Smokeless Tobacco Never Used   Counseling given: Not Answered   Continue to not smoke  Outpatient Encounter Medications as of 09/16/2019  Medication Sig  . acetaminophen (TYLENOL) 500 MG tablet Take 500 mg by mouth 3 (three) times daily.  [name] Kitchen albuterol (VENTOLIN HFA) 108 (90 Base) MCG/ACT inhaler INHALE 2 PUFFS EVERY 6 HOURS AS NEEDED FOR WHEEZING OR SHORTNESS OF BREATH.  [name] Kitchen amLODipine (NORVASC) 5 MG tablet Take 1 tablet (5 mg total) by mouth daily. **APPOINTMENT NEEDED FOR ADDITIONAL REFILLS**  . b complex vitamins tablet Take 1 tablet by mouth 3 (three) times a week.   .  escitalopram (LEXAPRO) 20 MG tablet Take 20 mg by mouth daily.  . Fluticasone-Umeclidin-Vilant (TRELEGY ELLIPTA) 100-62.5-25 MCG/INH AEPB Take 1 puff by mouth daily. Rinse mouth  . LORazepam (ATIVAN) 0.5 MG tablet Take 0.25 mg by mouth at bedtime as needed.  . meloxicam (MOBIC) 15 MG tablet TAKE 1 TABLET BY MOUTH EVERY DAY  . montelukast (SINGULAIR) 10 MG tablet TAKE 1 TABLET BY MOUTH EVERYDAY AT BEDTIME  . Vitamin D, Ergocalciferol, (DRISDOL) 1.25 MG (50000 UNIT) CAPS capsule Take 1 capsule (50,000 Units total) by mouth every 7 (seven) days.  [name] Kitchen amoxicillin-clavulanate (AUGMENTIN) 875-125 MG tablet Take 1 tablet by mouth 2 (two) times daily.  . predniSONE (DELTASONE) 10 MG tablet 4 tabs for 2 days, then 3 tabs for 2 days, 2 tabs for 2 days, then 1 tab for 2 days, then stop  . [DISCONTINUED] predniSONE (DELTASONE) 10 MG tablet 4tabs for 2,3 X 2 DAYS,2 X 2 DAYS,1 X 2 DAYS (Patient not taking: Reported on 09/16/2019)   No facility-administered encounter medications on file as of 09/16/2019.     Review of Systems  Review of Systems  Constitutional: Positive for fatigue. Negative for activity change and fever.  HENT: Positive for congestion (light brown), postnasal drip and rhinorrhea. Negative for sinus pressure, sinus pain and sore throat.   Respiratory: Positive for cough, shortness of breath and wheezing.   Cardiovascular: Negative for chest pain and palpitations.  Gastrointestinal: Negative for diarrhea, nausea and vomiting.  Musculoskeletal: Negative for arthralgias.  Neurological: Negative for dizziness.  Psychiatric/Behavioral: Negative for sleep disturbance. The patient is not nervous/anxious.      Physical Exam  BP 124/64   Pulse 75   Temp 99.1 F (37.3 C) (Oral)   Ht 5' 1.5" (1.562 m)   Wt 200 lb 8 oz (90.9 kg)   SpO2 94%   BMI 37.27 kg/m   Wt Readings from Last 5 Encounters:  09/16/19 200 lb  8 oz (90.9 kg)  09/11/19 201 lb (91.2 kg)  08/06/19 196 lb (88.9 kg)  07/27/19  196 lb 3.2 oz (89 kg)  05/25/19 195 lb 6.4 oz (88.6 kg)    BMI Readings from Last 5 Encounters:  09/16/19 37.27 kg/m  09/11/19 37.98 kg/m  08/06/19 37.03 kg/m  07/27/19 36.47 kg/m  05/25/19 36.32 kg/m     Physical Exam Vitals and nursing note reviewed.  Constitutional:      General: She is not in acute distress.    Appearance: Normal appearance. She is obese.  HENT:     Head: Normocephalic and atraumatic.     Right Ear: Tympanic membrane, ear canal and external ear normal. There is no impacted cerumen.     Left Ear: Tympanic membrane and ear canal normal. There is impacted cerumen (50%).     Ears:     Comments: Left ear canal with significant cerumen buildup    Nose: Rhinorrhea present. No congestion.     Comments: ?  Left nare nasal polyp    Mouth/Throat:     Mouth: Mucous membranes are moist.     Pharynx: Oropharynx is clear.     Comments: Postnasal drip Eyes:     Pupils: Pupils are equal, round, and reactive to light.  Cardiovascular:     Rate and Rhythm: Normal rate and regular rhythm.     Pulses: Normal pulses.     Heart sounds: Normal heart sounds. No murmur heard.   Pulmonary:     Effort: Pulmonary effort is normal. No respiratory distress.     Breath sounds: No decreased air movement. Wheezing (Expiratory) and rhonchi (Expiratory) present. No decreased breath sounds or rales.  Musculoskeletal:     Cervical back: Normal range of motion.  Skin:    General: Skin is warm and dry.     Capillary Refill: Capillary refill takes less than 2 seconds.  Neurological:     General: No focal deficit present.     Mental Status: She is alert and oriented to person, place, and time. Mental status is at baseline.     Gait: Gait normal.  Psychiatric:        Mood and Affect: Mood normal.        Behavior: Behavior normal.        Thought Content: Thought content normal.        Judgment: Judgment normal.       Assessment & Plan:   Obstructive sleep apnea treated with  continuous positive airway pressure (CPAP) Plan: Continue CPAP use Continue to follow-up with neurology  Nasal polyposis History of nasal polyps  Plan: Prednisone taper today Lab work today Referral to ENT Encourage patient to reestablish with allergy asthma   COPD mixed type ([place]) Plan: Prednisone taper today Augmentin today Continue Singulair Continue Trelegy Ellipta Chest x-ray today Lab work today Sputum cultures three-way's today-fungal, respiratory sputum, AFB  Allergic rhinitis due to allergen Not currently on a daily antihistamine Feels some clinical relief with nasal saline rinses but does not use these regularly Using Singulair Has had issues with nasal medications in the past, some medications like Nasacort or Astelin nasal spray actually causing psychiatric illness, she reports that she did not have acute worsened psychiatric symptoms with Flonase, she felt Flonase did work well for her but then it stopped working well for her so she stopped using it  Plan: Start a daily antihistamine Start nasal saline rinses Would recommend resuming Flonase use as you have had success  with this in the past Added Astelin nasal spray and Nasacort to allergy list Prednisone taper today Referral back to ENT Encourage patient to reestablish with Dr. Nelva Bush with allergy asthma Lab work today Patient may benefit from a biologic agent     Return in about 2 months (around 11/16/2019), or if symptoms worsen or fail to improve, for Follow up with Dr. Annamaria Boots.   Lauraine Rinne, NP 09/16/2019   This appointment required 42 minutes of patient care (this includes precharting, chart review, review of results, face-to-face care, etc.).

## 2019-09-16 NOTE — Assessment & Plan Note (Signed)
History of nasal polyps  Plan: Prednisone taper today Lab work today Referral to ENT Encourage patient to reestablish with allergy asthma

## 2019-09-16 NOTE — Assessment & Plan Note (Addendum)
Plan: Prednisone taper today Augmentin today Continue Singulair Continue Trelegy Ellipta Chest x-ray today Lab work today Sputum cultures three-way's today-fungal, respiratory sputum, AFB

## 2019-09-16 NOTE — Assessment & Plan Note (Signed)
Not currently on a daily antihistamine Feels some clinical relief with nasal saline rinses but does not use these regularly Using Singulair Has had issues with nasal medications in the past, some medications like Nasacort or Astelin nasal spray actually causing psychiatric illness, she reports that she did not have acute worsened psychiatric symptoms with Flonase, she felt Flonase did work well for her but then it stopped working well for her so she stopped using it  Plan: Start a daily antihistamine Start nasal saline rinses Would recommend resuming Flonase use as you have had success with this in the past Added Astelin nasal spray and Nasacort to allergy list Prednisone taper today Referral back to ENT Encourage patient to reestablish with Dr. Nelva Bush with allergy asthma Lab work today Patient may benefit from a biologic agent

## 2019-09-16 NOTE — Patient Instructions (Addendum)
You were seen today by Lauraine Rinne, NP  for:   1. Asthma/ COPD mixed type ([place])  - DG Chest 2 View; Future - amoxicillin-clavulanate (AUGMENTIN) 875-125 MG tablet; Take 1 tablet by mouth 2 (two) times daily.  Dispense: 14 tablet; Refill: 0 - predniSONE (DELTASONE) 10 MG tablet; 4 tabs for 2 days, then 3 tabs for 2 days, 2 tabs for 2 days, then 1 tab for 2 days, then stop  Dispense: 20 tablet; Refill: 0  Sputum cups provided today We will test sputum three-way's Follow the instructions and bring to our office after producing sputum  Trelegy Ellipta  >>> 1 puff daily in the morning >>>rinse mouth out after use  >>> This inhaler contains 3 medications that help manage her respiratory status, contact our office if you cannot afford this medication or unable to remain on this medication  Only use your albuterol as a rescue medication to be used if you can't catch your breath by resting or doing a relaxed purse lip breathing pattern.  - The less you use it, the better it will work when you need it. - Ok to use up to 2 puffs  every 4 hours if you must but call for immediate appointment if use goes up over your usual need - Don't leave home without it !!  (think of it like the spare tire for your car)   We will also send a prescription for a nebulizer as well as albuterol nebulized meds  You can utilize albuterol nebulized meds or your rescue inhaler 1 to the other every 6 hours as needed for shortness of breath or wheezing  Note your daily symptoms > remember "red flags" for COPD:   >>>Increase in cough >>>increase in sputum production >>>increase in shortness of breath or activity  intolerance.   If you notice these symptoms, please call the office to be seen.    2. Allergic rhinitis, unspecified seasonality, unspecified trigger  Please start taking a daily antihistamine:  >>>choose one of: zyrtec, claritin, allegra, or xyzal  >>>these are over the counter medications  >>>can choose  generic option  >>>take daily  >>>this medication helps with allergies, post nasal drip, and cough   Continue Singulair   start nasal saline rinses twice daily Use distilled water Shake well Get bottle lukewarm like a baby bottle  Would also recommend trialing restarting Flonase 1 spray each nostril after nasal saline rinses daily  3. Nasal polyposis  Schedule follow up with allergy asthma - Dr. Nelva Bush   We will refer you to ear nose and throat so you can establish with a new provider  4. Obstructive sleep apnea treated with continuous positive airway pressure (CPAP)  We recommend that you continue using your CPAP daily >>>Keep up the hard work using your device >>> Goal should be wearing this for the entire night that you are sleeping, at least 4 to 6 hours  Remember:  . Do not drive or operate heavy machinery if tired or drowsy.  . Please notify the supply company and office if you are unable to use your device regularly due to missing supplies or machine being broken.  . Work on maintaining a healthy weight and following your recommended nutrition plan  . Maintain proper daily exercise and movement  . Maintaining proper use of your device can also help improve management of other chronic illnesses such as: Blood pressure, blood sugars, and weight management.   BiPAP/ CPAP Cleaning:  >>>Clean weekly, with [facility]  soap, and bottle brush.  Set up to air dry. >>> Wipe mask out daily with wet wipe or towelette     We recommend today:  Orders Placed This Encounter  Procedures  . Respiratory or Resp and Sputum Culture    Standing Status:   Future    Standing Expiration Date:   09/15/2020  . Culture, fungus without smear    Standing Status:   Future    Standing Expiration Date:   09/15/2020  . AFB Culture & Smear    Standing Status:   Future    Standing Expiration Date:   09/15/2020  . DG Chest 2 View    Standing Status:   Future    Standing Expiration Date:   01/16/2020     Order Specific Question:   Reason for Exam (SYMPTOM  OR DIAGNOSIS REQUIRED)    Answer:   cough    Order Specific Question:   Preferred imaging location?    Answer:   Internal    Order Specific Question:   Radiology Contrast Protocol - do NOT remove file path    Answer:   [email or web address]  . CBC with Differential/Platelet    Standing Status:   Future    Standing Expiration Date:   09/15/2020  . IgE    Standing Status:   Future    Standing Expiration Date:   09/15/2020  . Ambulatory referral to ENT    Referral Priority:   Routine    Referral Type:   Consultation    Referral Reason:   Specialty Services Required    Requested Specialty:   Otolaryngology    Number of Visits Requested:   1   Orders Placed This Encounter  Procedures  . Respiratory or Resp and Sputum Culture  . Culture, fungus without smear  . AFB Culture & Smear  . DG Chest 2 View  . CBC with Differential/Platelet  . IgE  . Ambulatory referral to ENT   Meds ordered this encounter  Medications  . amoxicillin-clavulanate (AUGMENTIN) 875-125 MG tablet    Sig: Take 1 tablet by mouth 2 (two) times daily.    Dispense:  14 tablet    Refill:  0  . predniSONE (DELTASONE) 10 MG tablet    Sig: 4 tabs for 2 days, then 3 tabs for 2 days, 2 tabs for 2 days, then 1 tab for 2 days, then stop    Dispense:  20 tablet    Refill:  0    Follow Up:    Return in about 2 months (around 11/16/2019), or if symptoms worsen or fail to improve, for Follow up with Dr. Annamaria Boots.   Notification of test results are managed in the following manner: If there are  any recommendations or changes to the  plan of care discussed in office today,  we will contact you and let you know what they are. If you do not hear from [place], then your results are normal and you can view them through your  MyChart account , or a letter will be sent to you. Thank you again for trusting [place] with your care  - Thank you,  [facility]    It is flu season:   >>> Best ways to protect herself from the flu: Receive the yearly flu vaccine, practice good hand hygiene washing with soap and also using hand sanitizer when available, eat a nutritious meals, get adequate rest, hydrate appropriately       Please contact the office if your symptoms  worsen or you have concerns that you are not improving.   Thank you for choosing [facility] for your healthcare, and for allowing [place] to partner with you on your healthcare journey. I am thankful to be able to provide care to you today.   Wyn Quaker FNP-C

## 2019-09-16 NOTE — Assessment & Plan Note (Signed)
Plan: Continue CPAP use Continue to follow-up with neurology

## 2019-09-17 ENCOUNTER — Other Ambulatory Visit: Payer: Medicare Other

## 2019-09-17 DIAGNOSIS — J449 Chronic obstructive pulmonary disease, unspecified: Secondary | ICD-10-CM

## 2019-09-17 LAB — IGE: IgE (Immunoglobulin E), Serum: 326 kU/L — ABNORMAL HIGH (ref <=114)

## 2019-09-19 ENCOUNTER — Other Ambulatory Visit: Payer: Self-pay | Admitting: Family Medicine

## 2019-09-29 ENCOUNTER — Other Ambulatory Visit: Payer: Medicare Other

## 2019-09-29 ENCOUNTER — Telehealth: Payer: Self-pay | Admitting: Pulmonary Disease

## 2019-09-29 DIAGNOSIS — J449 Chronic obstructive pulmonary disease, unspecified: Secondary | ICD-10-CM | POA: Diagnosis not present

## 2019-09-29 NOTE — Telephone Encounter (Signed)
Toni Rinne, NP  09/20/2019 10:01 PM EDT     Allergy blood work shows elevations with IgE. This was also elevated 1 year ago. Eosinophil count is mildly elevated at 200. Would highly recommend as previously stated to reestablish with allergy asthma. Could consider starting biologic medications such as Xolair to help with management of symptoms and prevent steroid use. Allergy asthma can discuss this with you in more detail. 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  Wyn Quaker, FNP   Attempted to call pt but unable to reach. Left message for her to return call.

## 2019-09-30 NOTE — Telephone Encounter (Signed)
Called and spoke with pt letting her know the results of labwork and info stated by Aaron Edelman and she verbalized understanding. Pt stated that she did begin to take the allergy meds that Aaron Edelman discussed with her at last OV and she said they are helping. Pt is going to contact Allergy and Asthma to get an appt scheduled with them for when she returns home from her trip. Nothing further needed.

## 2019-10-02 LAB — RESPIRATORY CULTURE OR RESPIRATORY AND SPUTUM CULTURE
MICRO NUMBER:: 10977525
RESULT:: NORMAL
SPECIMEN QUALITY:: ADEQUATE

## 2019-10-02 LAB — CULTURE, FUNGUS WITHOUT SMEAR

## 2019-10-12 ENCOUNTER — Ambulatory Visit: Payer: Medicare Other | Admitting: Family Medicine

## 2019-10-23 ENCOUNTER — Other Ambulatory Visit: Payer: Self-pay | Admitting: Family Medicine

## 2019-11-01 ENCOUNTER — Other Ambulatory Visit: Payer: Self-pay | Admitting: Internal Medicine

## 2019-11-04 ENCOUNTER — Other Ambulatory Visit: Payer: Self-pay | Admitting: Allergy

## 2019-11-05 LAB — AFB ID BY DNA PROBE
M avium complex: NEGATIVE
M gordonae: POSITIVE — AB
M tuberculosis complex: NEGATIVE

## 2019-11-05 LAB — AFB CULTURE WITH SMEAR (NOT AT ARMC)
Acid Fast Culture: POSITIVE — AB
Acid Fast Smear: NEGATIVE

## 2019-11-06 ENCOUNTER — Telehealth: Payer: Self-pay | Admitting: Allergy

## 2019-11-06 ENCOUNTER — Other Ambulatory Visit: Payer: Self-pay

## 2019-11-06 MED ORDER — MONTELUKAST SODIUM 10 MG PO TABS: ORAL_TABLET | ORAL | 0 refills | Status: DC

## 2019-11-06 NOTE — Telephone Encounter (Signed)
Courtesy refill sent in and left voicemail to inform patient.

## 2019-11-06 NOTE — Telephone Encounter (Signed)
Patient is out of Singulair and would like a courtesy refill before her appointment on 12/22. Patient was offered an appointment with a NP, but she wanted to see her doctor and 12/22 was first available.  Uses [facility] on [address].  Please advise.

## 2019-11-11 ENCOUNTER — Other Ambulatory Visit: Payer: Self-pay

## 2019-11-11 ENCOUNTER — Ambulatory Visit (INDEPENDENT_AMBULATORY_CARE_PROVIDER_SITE_OTHER): Payer: Medicare Other | Admitting: Family Medicine

## 2019-11-11 ENCOUNTER — Encounter: Payer: Self-pay | Admitting: Family Medicine

## 2019-11-11 DIAGNOSIS — M5136 Other intervertebral disc degeneration, lumbar region: Secondary | ICD-10-CM

## 2019-11-11 DIAGNOSIS — Z6837 Body mass index (BMI) 37.0-37.9, adult: Secondary | ICD-10-CM | POA: Diagnosis not present

## 2019-11-11 MED ORDER — HYDROXYZINE HCL 10 MG PO TABS: 20.0000 mg | ORAL_TABLET | Freq: Three times a day (TID) | ORAL | 0 refills | Status: DC | PRN

## 2019-11-11 MED ORDER — DULOXETINE HCL 20 MG PO CPEP: 20.0000 mg | ORAL_CAPSULE | Freq: Every day | ORAL | 0 refills | Status: DC

## 2019-11-11 NOTE — Assessment & Plan Note (Signed)
Known significant degenerative disc disease.  Patient has arthritic changes of multiple other joints.  Patient has had some mild increase in stress recently.  Discontinue Lexapro and start on Cymbalta to help with some more in the pain control.  Discussed continuing the exercises and continuing the weight loss.  Patient does have the meloxicam but would like patient to do it in 5-day burst and continue to use it.  Patient is in agreement with the plan.  See me again 4 to 8 weeks

## 2019-11-11 NOTE — Patient Instructions (Addendum)
Good to see you Stop lexapro Start Cymbalta 20 mg and hydroxyzine 20 mg up to 3 times a day as needed DHEA for 4 weeks then 2 weeks off and can continue Keep up weight loss See me again in 2-3 months

## 2019-11-11 NOTE — Assessment & Plan Note (Signed)
Encourage patient to continue with the weight loss.

## 2019-11-11 NOTE — Progress Notes (Signed)
[place] [address] [place] Phone: [phone] Subjective:   I [name] am serving as a Education administrator for Dr. Hulan Saas.  This visit occurred during the SARS-CoV-2 public health emergency.  Safety protocols were in place, including screening questions prior to the visit, additional usage of staff PPE, and extensive cleaning of exam room while observing appropriate contact time as indicated for disinfecting solutions.   I'm seeing this patient by the request  of:  Hoyt Koch, MD  CC: Low back pain, knee pain follow-up  RSW:[id number]   09/11/2019 Significant arthritic changes.  Patient still does not want significant number medication secondary to weight gain.  Working with healthy weight and wellness.  Discussed some potential over-the-counter medications that could be beneficial, meloxicam for breakthrough pain.  Laboratory work-up did show vitamin D deficiency start once weekly vitamin D follow-up again in 2 months   Update 11/11/2019 SONTEE DESENA is a 69 y.o. female coming in with complaint of low back pain. Patient states she is doing a little better. Knee is not as painful. Back is doing better. Exercising and believes she should exercise more.  Patient feels like she has been doing relatively well.  Still having some pain on a fairly regular basis.  Patient has had some more stress recently in her life.  Patient is trying to lose weight but has to keep stopping secondary to the back pain.       Past Medical History:  Diagnosis Date  . ADD (attention deficit disorder)   . Adult acne   . Alcohol abuse   . Allergic rhinitis   . Anxiety and depression   . Asthma   . Congenital pneumonia   . Eczema   . Hypoglycemia    no longer per pt   . Hypothyroidism   . Impaired fasting blood sugar   . Mycoplasma pneumonia   . Restless leg syndrome   . Seasonal allergies   . Stroke ([place])   . Urticaria    Past Surgical  History:  Procedure Laterality Date  . DILATION AND CURETTAGE OF UTERUS     @ 69 years old  . TOTAL HIP ARTHROPLASTY Left 07/29/2018   Procedure: Left Anterior Hip Arthroplasty;  Surgeon: Melrose Nakayama, MD;  Location: [facility];  Service: Orthopedics;  Laterality: Left;  . WISDOM TOOTH EXTRACTION     Social History   Socioeconomic History  . Marital status: Widowed    Spouse name: has boyfriend Sam  . Number of children: 2  . Years of education: 84  . Highest education level: Some college, no degree  Occupational History  . Occupation: Clinical biochemist    Comment: Semi-retired  Tobacco Use  . Smoking status: Former Smoker    Packs/day: 2.00    Years: 15.00    Pack years: 30.00    Types: Cigarettes    Quit date: 1980    Years since quitting: 41.8  . Smokeless tobacco: Never Used  Vaping Use  . Vaping Use: Former  Substance and Sexual Activity  . Alcohol use: Yes    Alcohol/week: 0.0 standard drinks    Comment: 3-5 glasses of wine day   . Drug use: No  . Sexual activity: Not on file  Other Topics Concern  . 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 Difficulty of Paying Living Expenses: Not on file  Food Insecurity:   . Worried About Charity fundraiser in the Last Year: Not on file  . Ran Out of Food in the Last Year: Not on file  Transportation Needs:   . Lack of Transportation (Medical): Not on file  . Lack of Transportation (Non-Medical): Not on file  Physical Activity:   . Days of Exercise per Week: Not on file  . Minutes of Exercise per Session: Not on file  Stress:   . Feeling of Stress : Not on file  Social Connections:   . Frequency of  Communication with Friends and Family: Not on file  . Frequency of Social Gatherings with Friends and Family: Not on file  . Attends Religious Services: Not on file  . Active Member of Clubs or Organizations: Not on file  . Attends Archivist Meetings: Not on file  . Marital Status: Not on file   Allergies  Allergen Reactions  . Astelin [Azelastine] Other (See Comments)    Per patient caused psychiatric illness  . Nasonex [Mometasone] Other (See Comments)    Per patient caused psychiatric illness  . Other     Can not eat carbohydrates without protein, is allergic to certain foods but can take in certain doses   Family History  Problem Relation Age of Onset  . COPD Father   . Heart failure Father   . Coronary artery disease Father   . Cancer Mother        breast  . Hypertension Mother   . Dementia Mother   . Stroke Mother   . Breast cancer Mother     Current Outpatient Medications (Endocrine & Metabolic):  .  predniSONE (DELTASONE) 10 MG tablet, 4 tabs for 2 days, then 3 tabs for 2 days, 2 tabs for 2 days, then 1 tab for 2 days, then stop  Current Outpatient Medications (Cardiovascular):  .  amLODipine (NORVASC) 5 MG tablet, Take 1 tablet (5 mg total) by mouth daily. **APPOINTMENT NEEDED FOR ADDITIONAL REFILLS**  Current Outpatient Medications (Respiratory):  .  albuterol (VENTOLIN HFA) 108 (90 Base) MCG/ACT inhaler, INHALE 2 PUFFS EVERY 6 HOURS AS NEEDED FOR WHEEZING OR SHORTNESS OF BREATH. [name] Kitchen  Fluticasone-Umeclidin-Vilant (TRELEGY ELLIPTA) 100-62.5-25 MCG/INH AEPB, Take 1 puff by mouth daily. Rinse mouth .  montelukast (SINGULAIR) 10 MG tablet, TAKE 1 TABLET BY MOUTH EVERYDAY AT BEDTIME  Current Outpatient Medications (Analgesics):  .  acetaminophen (TYLENOL) 500 MG tablet, Take 500 mg by mouth 3 (three) times daily. .  meloxicam (MOBIC) 15 MG tablet, TAKE 1 TABLET BY MOUTH EVERY DAY   Current Outpatient Medications (Other):  .  b complex vitamins tablet, Take  1 tablet by mouth 3 (three) times a week.  [name] Kitchen  LORazepam (ATIVAN) 0.5 MG tablet, Take 0.25 mg by mouth at bedtime as needed. .  Vitamin D, Ergocalciferol, (DRISDOL) 1.25 MG (50000 UNIT) CAPS capsule, Take 1 capsule (50,000 Units total) by mouth every 7 (seven) days. .  DULoxetine (CYMBALTA) 20 MG capsule, Take 1 capsule (20 mg total) by mouth daily. .  hydrOXYzine (ATARAX/VISTARIL) 10 MG tablet, Take 2 tablets (20 mg total) by mouth 3 (three) times daily as needed.   Reviewed prior external information including notes and imaging from  primary care provider As well  as notes that were available from care everywhere and other healthcare systems.  Past medical history, social, surgical and family history all reviewed in electronic medical record.  No pertanent information unless stated regarding to the chief complaint.   Review of Systems:  No headache, visual changes, nausea, vomiting, diarrhea, constipation, dizziness, abdominal pain, skin rash, fevers, chills, night sweats, weight loss, swollen lymph nodes, body aches, joint swelling, chest pain, shortness of breath, mood changes. POSITIVE muscle aches  Objective  Blood pressure 120/70, pulse 75, height 5\' 1"  (1.549 m), weight 201 lb (91.2 kg), SpO2 96 %.   General: No apparent distress alert and oriented x3 mood and affect normal, dressed appropriately.  HEENT: Pupils equal, extraocular movements intact  Respiratory: Patient's speak in full sentences and does not appear short of breath  Cardiovascular: No lower extremity edema, non tender, no erythema  Arthritic changes of multiple joints.  Patient's knees especially the right knee still has tenderness over the medial joint space.  No diffuse fluid though noted.  Lacks the last 2 degrees of extension.  Low back exam does have some loss of lordosis.  Some tightness noted with straight leg test bilaterally but no true radicular symptoms.  Diffusely tender in the lumbar spine. Impression and  Recommendations:     The above documentation has been reviewed and is accurate and complete Lyndal Pulley, DO

## 2019-12-01 ENCOUNTER — Other Ambulatory Visit: Payer: Self-pay | Admitting: Family Medicine

## 2019-12-01 DIAGNOSIS — J3489 Other specified disorders of nose and nasal sinuses: Secondary | ICD-10-CM | POA: Insufficient documentation

## 2019-12-02 ENCOUNTER — Other Ambulatory Visit: Payer: Self-pay | Admitting: Allergy

## 2019-12-03 ENCOUNTER — Other Ambulatory Visit: Payer: Self-pay | Admitting: Family Medicine

## 2019-12-04 ENCOUNTER — Other Ambulatory Visit: Payer: Self-pay | Admitting: Family Medicine

## 2019-12-08 ENCOUNTER — Encounter: Payer: Self-pay | Admitting: Internal Medicine

## 2019-12-09 ENCOUNTER — Ambulatory Visit: Payer: Medicare Other | Admitting: Internal Medicine

## 2019-12-09 ENCOUNTER — Encounter: Payer: Self-pay | Admitting: Internal Medicine

## 2019-12-09 ENCOUNTER — Other Ambulatory Visit: Payer: Self-pay

## 2019-12-09 VITALS — BP 134/70 | HR 85 | Temp 97.6°F | Wt 196.4 lb

## 2019-12-09 DIAGNOSIS — J4551 Severe persistent asthma with (acute) exacerbation: Secondary | ICD-10-CM | POA: Diagnosis not present

## 2019-12-09 DIAGNOSIS — L309 Dermatitis, unspecified: Secondary | ICD-10-CM | POA: Insufficient documentation

## 2019-12-09 DIAGNOSIS — G4733 Obstructive sleep apnea (adult) (pediatric): Secondary | ICD-10-CM

## 2019-12-09 DIAGNOSIS — J3489 Other specified disorders of nose and nasal sinuses: Secondary | ICD-10-CM

## 2019-12-09 DIAGNOSIS — J42 Unspecified chronic bronchitis: Secondary | ICD-10-CM | POA: Diagnosis not present

## 2019-12-09 DIAGNOSIS — Z9989 Dependence on other enabling machines and devices: Secondary | ICD-10-CM

## 2019-12-09 DIAGNOSIS — L2082 Flexural eczema: Secondary | ICD-10-CM

## 2019-12-09 MED ORDER — ALBUTEROL SULFATE HFA 108 (90 BASE) MCG/ACT IN AERS
1.0000 | INHALATION_SPRAY | Freq: Four times a day (QID) | RESPIRATORY_TRACT | 3 refills | Status: DC | PRN
Start: 2019-12-09 — End: 2020-01-27

## 2019-12-09 NOTE — Assessment & Plan Note (Signed)
Managed by Neurology

## 2019-12-09 NOTE — Progress Notes (Signed)
HPI F former smoker followed for OSA, complicated by Restless Legs, ETOH abuse, ETOH induced insomnia, Allergic rhinitis/ conjunctivitis, Nasal polyps, anxiety, depression, HBP, CVA, Hypothyroid, Asthma, Urticaria, DM NPSG Dohmeier 04/08/2017-  AHI 11.25/ hr, desaturation to 53%, 174 lbs Office Spirometry 03/20/2018 (Allergy and Asthma) Moderate obstruction, with FVC 1.84/ 70%, FEV1 1.09/ 54%, ratio 0.59/ 77%, FEF25-75% 0.44/ 24%. PFT- 05/22/19- Mod Obst, Mod restriction, Nl Diffusion, Insignif resp to BD ---------------------------------------------------------------------------------------------------   05/25/19- 69 yoF former smoker followed for COPD, hx recurrent pneumonias,  complicated by OSA ( Dr Dohmeier/ [facility]), Restless Legs, ETOH abuse, ETOH induced insomnia, Allergic rhinitis/ conjunctivitis, Nasal polyps, anxiety, depression, HBP, CVA, Hypothyroid,  Urticaria, DM CPAP 9/ [facility]- followed by [facility]/ Dr Dohmeir  Really likes her AirPhysio flutter device, used several times daily. Again refers to her history of "60 pneumonias" and appreciates being better able to keep airways clear. No recent acute events. Had 2 Phizer Covax.  We reviewed CXR nd PFT. PFT- 05/22/19- Mod Obst, Mod restriction, Nl Diffusion, Insignif resp to BD CXR 05/22/19- Cardiac shadow is within normal limits. Aortic calcifications are noted. The lungs are well aerated bilaterally. No focal infiltrate or sizable effusion is seen. Degenerative changes of the thoracic spine are noted. IMPRESSION: No acute abnormality noted.  12/09/19- 69 yoF former smoker followed for COPD, hx recurrent pneumonias,  complicated by OSA ( Dr Dohmeier/ [facility]), Restless Legs, ETOH abuse, ETOH induced insomnia, Allergic rhinitis/ conjunctivitis, Nasal polyps, anxiety, depression, HBP, CVA, Hypothyroid,  Urticaria, DM CPAP 9/ [facility]- followed by [facility]/ Dr Maureen Chatters -----COPD--having bad days more often.  even with her meds.  Albuterol hfa, singulair,  Trelegy 100,  NP visit 09/16/19- reported worsening SOB and cough over previous 2 months. W/o response to doxy/ prednisone. Was given augmentin/ prednisone 9/8, which didn't help either. .  Has hx nasal polyps. She was encouraged to f/u with ENT and with Dr Padgett/ Allergy. Has now seen Dr Redmond Baseman ENT who plans septal plug for septal perforation she says she caused by picking at her nose. Persistent nasal congestion apt to clear by mid-day.  More aware of DOE carrying out trash/ ADLs. No blood, fever or acute event. Our lab records don't include testing for CF, a1AT def or granulomatous vasculitis/ Wegener's.  She describes "pneumonias since birth" with many tests done. Many times tested positive for common environmental allergies. Long hx eczema now more extensive. Sensitive to cold air.  Uses Flutter device daily for scant phlegm.  Sputum cx 9/21- Nl [place]ora,  AFB +M. [facility] 09/16/19- IgE 326 H, EOS wnl (recent prednisone). Covid vax- 3 Phizer Flu vax- declines Pneumonia vaccine- declines- fear of allergy to "additives". CXR 09/16/19-  IMPRESSION: Diffuse bronchitic changes. No focal pulmonary infiltrate.  ROS-see HPI   + = positive Constitutional:    weight loss, night sweats, fevers, chills, fatigue, lassitude. HEENT:    headaches, difficulty swallowing, tooth/dental problems, sore throat,       sneezing, itching, ear ache, +nasal congestion, post nasal drip, snoring CV:    chest pain, orthopnea, PND, swelling in lower extremities, anasarca,                                  dizziness, +palpitations Resp:   +shortness of breath with exertion or at rest.                +productive cough,   non-productive cough, coughing up of blood.  change in color of mucus.  wheezing.   Skin:    +rash or lesions. GI:    heartburn, +indigestion, abdominal pain, nausea, vomiting, diarrhea,                 change in bowel habits, loss of appetite GU: dysuria, change in color of urine, no urgency  or frequency.   flank pain. MS:   joint pain, +stiffness, decreased range of motion, back pain. Neuro-     nothing unusual Psych:  change in mood or affect.  depression or +anxiety.   memory loss.  OBJ- Physical Exam General- Alert, Oriented, Affect-appropriate, Distress- none acute, +obese Skin- + eczema antecubital, calves Lymphadenopathy- none Head- atraumatic            Eyes- Gross vision intact, PERRLA, conjunctivae and secretions clear            Ears- Hearing, canals-normal            Nose- Clear, no-Septal dev, mucus, polyps, erosion, perforation             Throat- Mallampati II , mucosa clear , drainage- none, tonsils- atrophic Neck- flexible , trachea midline, no stridor , thyroid nl, carotid no bruit Chest - symmetrical excursion , unlabored           Heart/CV- RRR , no murmur , no gallop  , no rub, nl s1 s2                           - JVD- none , edema- none, stasis changes- none, varices- none           Lung- diminished, wheeze-none, cough+loose/ coarse , dullness-none, rub- none           Chest wall-  Abd-  Br/ Gen/ Rectal- Not done, not indicated Extrem- cyanosis- none, clubbing, none, atrophy- none, strength- nl Neuro- grossly intact to observation

## 2019-12-09 NOTE — Patient Instructions (Signed)
Order-  Lab-   Alpha 1 antitrypsin assay    Phenotype and genotype                           Reculture sputum routine and AFB-  Send cups home    Dx Chronic bronchitis  Order- start Dupixent.   Begin injections here.    Dx Chronic asthmatic bronchitis, nasal polyps, Eczema,   Please call as needed

## 2019-12-09 NOTE — Assessment & Plan Note (Signed)
Evaluated by ENT with septal plug recommended. Etiology not determined.

## 2019-12-09 NOTE — Assessment & Plan Note (Signed)
Obvious eczema on antecubital areas at this visit, likely worse with current cold weather. She is using moisturizing lotion as directed by others.

## 2019-12-10 LAB — ALPHA-1-ANTITRYPSIN: A-1 Antitrypsin, Ser: 179 mg/dL (ref 83–199)

## 2019-12-11 ENCOUNTER — Other Ambulatory Visit: Payer: Medicare Other

## 2019-12-11 DIAGNOSIS — J42 Unspecified chronic bronchitis: Secondary | ICD-10-CM | POA: Diagnosis not present

## 2019-12-11 DIAGNOSIS — J449 Chronic obstructive pulmonary disease, unspecified: Secondary | ICD-10-CM | POA: Diagnosis not present

## 2019-12-29 ENCOUNTER — Ambulatory Visit: Payer: Medicare Other | Admitting: Family Medicine

## 2019-12-29 DIAGNOSIS — Z0289 Encounter for other administrative examinations: Secondary | ICD-10-CM

## 2019-12-30 ENCOUNTER — Encounter: Payer: Self-pay | Admitting: Allergy

## 2019-12-30 ENCOUNTER — Other Ambulatory Visit: Payer: Self-pay

## 2019-12-30 ENCOUNTER — Encounter: Payer: Medicare Other | Admitting: Allergy

## 2019-12-30 MED ORDER — FLUOCINONIDE 0.05 % EX CREA: 1.0000 "application " | TOPICAL_CREAM | Freq: Two times a day (BID) | CUTANEOUS | 5 refills | Status: DC | PRN

## 2019-12-30 MED ORDER — MONTELUKAST SODIUM 10 MG PO TABS
ORAL_TABLET | ORAL | 3 refills | Status: DC
Start: 2019-12-30 — End: 2020-03-23

## 2019-12-30 NOTE — Patient Instructions (Signed)
Eczema  - will refill Lidex ointment for you for as needed use for eczema flares.  Apply thin layer twice a day as needed  - continue daily moisturization   Allergies  - continue avoidance measures for dust mites, cat, dog, mold and tree pollen  - perform nasal saline rinses routinely (several times a week if not daily)  - Continue Singulair 10mg daily (5mg in AM and 5mg in PM)    Nasal polyps - Dupixent injections has been discussed previously as a non-surgical add-on therapy for control of nasal polyps.  At this time will wait until long-term data is available.    Asthma  - have access to albuterol inhaler 2 puffs every 4-6 hours as needed for cough/wheeze/shortness of breath/chest tightness.  May use 15-20 minutes prior to activity.   Monitor frequency of use.    - continue [place] 1 puff twice a day  - singulair as above  Asthma control goals:   Full participation in all desired activities (may need albuterol before activity)  Albuterol use two time or less a week on average (not counting use with activity)  Cough interfering with sleep two time or less a month  Oral steroids no more than once a year  No hospitalizations  Nasal perforation with septal deviation  -  nasal saline rinses routinely  Sleep Apnea  - continue CPAP use and follow with pulmonology for continued management     Follow-up 6 months or sooner if needed 

## 2019-12-30 NOTE — Progress Notes (Signed)
Pt had a tooth to fall out last night.  Dentist called while she was in the office with an emergency opening today at 1230.  By the time pt was ready to be seen would be difficult for her to make this emergency appointment thus decision made for pt to reschedule appt with me to have dental repair.    This encounter was created in error - please disregard.

## 2019-12-31 ENCOUNTER — Other Ambulatory Visit: Payer: Self-pay | Admitting: Family Medicine

## 2020-01-04 ENCOUNTER — Telehealth: Payer: Self-pay | Admitting: Internal Medicine

## 2020-01-04 NOTE — Telephone Encounter (Signed)
Called and spoke with patient. She was calling to get a prescription for her [facility]but[facility]ol solution [facility] [address]. She stated that she has a nebulizer machine. Advised her that since the medication was not on her chart, I would have to ask Dr. Annamaria Boots before sending in the the RX. She verbalized understanding.   Dr. Annamaria Boots, are you ok with me sending a RX for albuterol neb. Solution? Thanks!

## 2020-01-04 NOTE — Telephone Encounter (Signed)
Yes thanks 

## 2020-01-05 MED ORDER — ALBUTEROL SULFATE (2.5 MG/3ML) 0.083% IN NEBU: 2.5000 mg | INHALATION_SOLUTION | Freq: Four times a day (QID) | RESPIRATORY_TRACT | 5 refills | Status: DC | PRN

## 2020-01-05 NOTE — Telephone Encounter (Signed)
Spoke with patient. She is aware that [place] has approved the albuterol solution. Will go ahead and send [facility] the [facility] [address]   Nothing further needed at time of call.

## 2020-01-06 ENCOUNTER — Other Ambulatory Visit: Payer: Self-pay

## 2020-01-06 ENCOUNTER — Other Ambulatory Visit: Admission: RE | Admit: 2020-01-06 | Payer: Medicare Other | Source: Ambulatory Visit

## 2020-01-06 ENCOUNTER — Telehealth: Payer: Self-pay | Admitting: Family Medicine

## 2020-01-06 MED ORDER — DULOXETINE HCL 20 MG PO CPEP
ORAL_CAPSULE | ORAL | 3 refills | Status: DC
Start: 2020-01-06 — End: 2020-03-31

## 2020-01-06 MED ORDER — HYDROXYZINE HCL 10 MG PO TABS
10.0000 mg | ORAL_TABLET | Freq: Every day | ORAL | 0 refills | Status: DC
Start: 2020-01-06 — End: 2020-05-16

## 2020-01-06 NOTE — Telephone Encounter (Signed)
Per the note she was to stop the lexapro and take the cymbalta daily   Hydroxyzine would only be as needed- does not need to take it 3 times a day

## 2020-01-06 NOTE — Telephone Encounter (Signed)
Spoke with patient and she is taking hydroxizine 10mg  each night. Per a verbal Dr. is ok with her using 10mg  hydroxizine at night. Recommended for her to take Cymbalta 20mg  daily as Dr. Katrinka Blazing recommended. Patient voices understanding.

## 2020-01-06 NOTE — Telephone Encounter (Signed)
Medications called in per a verbal from Dr. Katrinka Blazing.

## 2020-01-06 NOTE — Telephone Encounter (Signed)
Patient called stating that she is needing a refill on hydrOXYzine (ATARAX/VISTARIL) 10 MG tablet and DULoxetine (CYMBALTA) 20 MG capsule [facility] [address].  She was confused about the directions on the Hydroxyzine - It states to take 2 tablets three times a day. And with the DULoxetine (CYMBALTA) 20 MG capsule, she has not been taking it as often as directed. She has only been taking it every 2 or three nights.

## 2020-01-12 ENCOUNTER — Other Ambulatory Visit: Payer: Medicare Other

## 2020-01-13 LAB — AFB CULTURE WITH SMEAR (NOT AT ARMC)
Acid Fast Culture: POSITIVE — AB
Acid Fast Smear: NEGATIVE

## 2020-01-13 LAB — AFB ID BY DNA PROBE
M avium complex: NEGATIVE
M tuberculosis complex: NEGATIVE

## 2020-01-13 LAB — ORGANISM ID BY SEQUENCING

## 2020-01-27 ENCOUNTER — Ambulatory Visit: Payer: Medicare Other | Admitting: Allergy

## 2020-01-27 ENCOUNTER — Encounter: Payer: Self-pay | Admitting: Allergy

## 2020-01-27 ENCOUNTER — Other Ambulatory Visit: Payer: Self-pay

## 2020-01-27 VITALS — BP 134/80 | HR 84 | Temp 98.4°F | Resp 18 | Ht 61.0 in | Wt 196.0 lb

## 2020-01-27 DIAGNOSIS — G4733 Obstructive sleep apnea (adult) (pediatric): Secondary | ICD-10-CM

## 2020-01-27 DIAGNOSIS — J339 Nasal polyp, unspecified: Secondary | ICD-10-CM

## 2020-01-27 DIAGNOSIS — J3089 Other allergic rhinitis: Secondary | ICD-10-CM | POA: Diagnosis not present

## 2020-01-27 DIAGNOSIS — L2089 Other atopic dermatitis: Secondary | ICD-10-CM

## 2020-01-27 DIAGNOSIS — J3489 Other specified disorders of nose and nasal sinuses: Secondary | ICD-10-CM | POA: Diagnosis not present

## 2020-01-27 DIAGNOSIS — J4540 Moderate persistent asthma, uncomplicated: Secondary | ICD-10-CM | POA: Diagnosis not present

## 2020-01-27 LAB — RAPID GROWER BROTH SUSCEP.
Ciprofloxacin: 0.5
Clarithromycin: 16
Clofazimine: 0.12
Doxycycline: >8
Imipenem: 4
Linezolid: 4
Tigecycline: 0.25

## 2020-01-27 LAB — SPECIMEN STATUS REPORT

## 2020-01-27 MED ORDER — OPZELURA 1.5 % EX CREA: 1.0000 "application " | TOPICAL_CREAM | Freq: Two times a day (BID) | CUTANEOUS | 5 refills | Status: DC | PRN

## 2020-01-27 MED ORDER — ALBUTEROL SULFATE HFA 108 (90 BASE) MCG/ACT IN AERS: 1.0000 | INHALATION_SPRAY | Freq: Four times a day (QID) | RESPIRATORY_TRACT | 1 refills | Status: DC | PRN

## 2020-01-27 NOTE — Patient Instructions (Addendum)
Eczema  - Not well controlled at this time  - Can continue as needed use of Lidex ointment to moderately flared areas  - Discussed and prescribed Opzelura ointment which is a new nonsteroidal eczema ointment.  Can apply to flared areas 1-2 times a day as needed  - Discussed Dupixent again today in regards to improving eczema control  - Continue daily moisturization   Allergies  - continue avoidance measures for dust mites, cat, dog, mold and tree pollen  - perform nasal saline rinses routinely (several times a week if not daily)  - Continue Singulair 10mg  daily  Nasal polyps -Reported history of nasal polyps however on most recent ENT examination no polyps were visualized.  It is also likely that with her history of systemic steroid use for asthma control that this may have helped any polyp that may have been there - Dupixent injections has been discussed as a non-surgical add-on therapy for control of nasal polyps.   Asthma/COPD overlap  - have access to albuterol inhaler 2 puffs every 4-6 hours as needed for cough/wheeze/shortness of breath/chest tightness.  May use 15-20 minutes prior to activity.   Monitor frequency of use.    - continue Trelegy daily  - singulair as above  - Dupixent has been discussed by Dr. Annamaria Boots and start approval process.  I again discussed Dupixent with her today as well reviewing benefits and risks.  She is agreeable at this time to start on [place] for improved asthma control as well as eczema control  Asthma control goals:   Full participation in all desired activities (may need albuterol before activity)  Albuterol use two time or less a week on average (not counting use with activity)  Cough interfering with sleep two time or less a month  Oral steroids no more than once a year  No hospitalizations  Nasal perforation with septal deviation  - nasal saline rinses routinely  - Agree with nasal septal repair  Sleep Apnea  - continue CPAP use and  follow with pulmonology for continued management     Follow-up 4 months or sooner if needed

## 2020-01-27 NOTE — Progress Notes (Signed)
Follow-up Note  RE: Toni Hicks MRN: [id number] DOB: Jun 09, 1950 Date of Office Visit: 01/27/2020   History of present illness: Toni Hicks is a 70 y.o. female presenting today for follow-up of eczema, allergic rhinitis with nasal polyps, asthma.  She also has history of nasal perforation and sleep apnea.   She was last seen in the office on 07/23/2018 by myself.   She does follow with Dr. Annamaria Boots in pulmonology for chronic bronchitis/copd with last visit on 12/09/19.  She is on trelegy and singulair and as needed albuterol at this time.  dupixent discussed again by Dr. Annamaria Boots with decision to proceed with injections.  Patient states home however it has been about 6 weeks that she has not heard from the office in regards to approval or when she can get started.  She states that this time she has been more concerned with the decline she seems to be having with her health.  She states her oxygen saturations appear to be dropping when she used to maintain at 98-99%.  She states she does routinely check her oxygen saturations and have been getting lower values like 92, 94%.  She also states she is more fatigued.  It is hard to do routine activities like laundry or getting mail from the [place].  She is using her albuterol about 4-6 times a day at this point which she states is less than what she was using previously.  As she attributes this to the change to Trelegy.  She also has been using a flutter device which she uses before she does her albuterol which she states helps to open of the airway so the albuterol can be more effective.  She does note needing to use less systemic steroids since going on to Trelegy.  She also continues to take singular 10 mg daily.  She has seen Dr. Redmond Baseman with ENT who plans for septal plug for her nasal perforation.  She states that she was told at this visit that she did not have any polyps.  She states her sinuses still give her problem and she has a lot of drainage and  this causes stomach upset and sometimes vomiting.  She states the mucus is typically clear.  She states that her eczema is getting worse and she has new areas now.  Her largest areas of her right wrist currently.  She states she usually had in her arm creases which look good at this time.  She also has had some more issues with areas around her ankles.  She states the itch is such a problem as she scratches in his sleep especially the ankle area to the point of bleeding.  The Lidex cream does not seem to be helping as much anymore.  She has had 3 pfizer covid vaccines.    Review of systems: Review of Systems  Constitutional: Positive for malaise/fatigue.  HENT:       See HPI  Eyes: Negative.   Respiratory: Positive for cough, sputum production and shortness of breath.   Cardiovascular: Negative.   Gastrointestinal: Negative.   Musculoskeletal: Negative.   Skin: Positive for itching and rash.  Neurological: Negative.     All other systems negative unless noted above in HPI  Past medical/social/surgical/family history have been reviewed and are unchanged unless specifically indicated below.  No changes  Medication List: Current Outpatient Medications  Medication Sig Dispense Refill  . acetaminophen (TYLENOL) 500 MG tablet Take 500 mg by mouth every 8 (eight)  hours as needed.     [name] Kitchen albuterol (PROVENTIL) (2.5 MG/3ML) 0.083% nebulizer solution Take 3 mLs (2.5 mg total) by nebulization every 6 (six) hours as needed for wheezing or shortness of breath. 120 mL 5  . amLODipine (NORVASC) 5 MG tablet Take 1 tablet (5 mg total) by mouth daily. **APPOINTMENT NEEDED FOR ADDITIONAL REFILLS** 90 tablet 0  . b complex vitamins tablet Take 1 tablet by mouth 3 (three) times a week.    . DULoxetine (CYMBALTA) 20 MG capsule TAKE 1 CAPSULE BY MOUTH EVERY DAY 30 capsule 3  . fluocinonide cream (LIDEX) 2.44 % Apply 1 application topically 2 (two) times daily as needed. 30 g 5  .  Fluticasone-Umeclidin-Vilant (TRELEGY ELLIPTA) 100-62.5-25 MCG/INH AEPB Take 1 puff by mouth daily. Rinse mouth 180 each 4  . hydrOXYzine (ATARAX/VISTARIL) 10 MG tablet Take 1 tablet (10 mg total) by mouth at bedtime. 90 tablet 0  . meloxicam (MOBIC) 15 MG tablet TAKE 1 TABLET BY MOUTH EVERY DAY 30 tablet 0  . montelukast (SINGULAIR) 10 MG tablet TAKE 1 TABLET BY MOUTH EVERYDAY AT BEDTIME 30 tablet 3  . mupirocin ointment (BACTROBAN) 2 % SMARTSIG:1 Application Topical 2-3 Times Daily    . Ruxolitinib Phosphate (OPZELURA) 1.5 % CREA Apply 1 application topically 2 (two) times daily as needed. For itchy rash. 60 g 5  . albuterol (VENTOLIN HFA) 108 (90 Base) MCG/ACT inhaler Inhale 1-2 puffs into the lungs every 6 (six) hours as needed for wheezing or shortness of breath. 18 g 1   No current facility-administered medications for this visit.     Known medication allergies: Allergies  Allergen Reactions  . Astelin [Azelastine] Other (See Comments)    Per patient caused psychiatric illness  . Nasonex [Mometasone] Other (See Comments)    Per patient caused psychiatric illness  . Other     Can not eat carbohydrates without protein, is allergic to certain foods but can take in certain doses     Physical examination: Blood pressure 134/80, pulse 84, temperature 98.4 F (36.9 C), resp. rate 18, height 5\' 1"  (1.549 m), weight 196 lb (88.9 kg), SpO2 94 %.  General: Alert, interactive, in no acute distress. HEENT: PERRLA, TMs pearly gray, turbinates minimally edematous without discharge, post-pharynx non erythematous. Neck: Supple without lymphadenopathy. Lungs: Mildly decreased breath sounds bilaterally without wheezing, rhonchi or rales. {no increased work of breathing. CV: Normal S1, S2 without murmurs. Abdomen: Nondistended, nontender. Skin: Dry, scaly large erythematous patch over right wrist area, excoriated erythematous small patch on the medial right ankle. Extremities:  No clubbing,  cyanosis or edema. Neuro:   Grossly intact.  Diagnositics/Labs: None today  Assessment and plan:   Eczema  - Not well controlled at this time  - Can continue as needed use of Lidex ointment to moderately flared areas  - Discussed and prescribed Opzelura ointment which is a new nonsteroidal eczema ointment.  Can apply to flared areas 1-2 times a day as needed  - Discussed Dupixent again today in regards to improving eczema control  - Continue daily moisturization   Allergic rhinitis  - continue avoidance measures for dust mites, cat, dog, mold and tree pollen  - perform nasal saline rinses routinely (several times a week if not daily)  - Continue Singulair 10mg  daily  Nasal polyps -Reported history of nasal polyps however on most recent ENT examination no polyps were visualized.  It is also likely that with her history of systemic steroid use for asthma control that this  may have helped any polyp that may have been there - Dupixent injections has been discussed as a non-surgical add-on therapy for control of nasal polyps.   Asthma/COPD overlap  - have access to albuterol inhaler 2 puffs every 4-6 hours as needed for cough/wheeze/shortness of breath/chest tightness.  May use 15-20 minutes prior to activity.   Monitor frequency of use.    - continue Trelegy daily  - singulair as above  - Dupixent has been discussed by Dr. Annamaria Boots and start approval process.  I again discussed Dupixent with her today as well reviewing benefits and risks.  She is agreeable at this time to start on [place] for improved asthma control as well as eczema control  Asthma control goals:   Full participation in all desired activities (may need albuterol before activity)  Albuterol use two time or less a week on average (not counting use with activity)  Cough interfering with sleep two time or less a month  Oral steroids no more than once a year  No hospitalizations  Nasal perforation with septal  deviation  - nasal saline rinses routinely  - Agree with nasal septal repair  Sleep Apnea  - continue CPAP use and follow with pulmonology for continued management     Follow-up 4 months or sooner if needed  I appreciate the opportunity to take part in [place] care. Please do not hesitate to contact me with questions.  Sincerely,   Prudy Feeler, MD Allergy/Immunology [facility]

## 2020-01-28 MED ORDER — EPINEPHRINE 0.3 MG/0.3ML IJ SOAJ: 0.3000 mg | Freq: Once | INTRAMUSCULAR | 2 refills | Status: DC

## 2020-01-29 ENCOUNTER — Telehealth: Payer: Self-pay | Admitting: *Deleted

## 2020-01-29 NOTE — Telephone Encounter (Signed)
-----   Message from [place], MD sent at 01/28/2020  1:35 PM EST ----- Hi Nels Munn.  This patient saw Dr. Keturah Barre recently and he got her to agree to start on [place].  She states they started the process but its been 6 weeks now and she has not heard anything from them about starting.  Is there any way you could check into this and maybe assist in this so she can get started sooner?  Thanks

## 2020-01-29 NOTE — Telephone Encounter (Signed)
L./M for patient to find out if they are trying to get her approved through patient assistance program.  If so I know they are way behind and I cant really submit for same at this time because it would further slow process.  I would advise her that she should follow up with them or contact [place] My way for status

## 2020-01-30 ENCOUNTER — Other Ambulatory Visit: Payer: Self-pay | Admitting: Family Medicine

## 2020-02-01 NOTE — Telephone Encounter (Signed)
Spoke to patient and advised she has ot heard anything form [place] and was told same could take months to get and wants [place] to move forward with same.  I am mailing app for patient to return along with income info for app and will be in touch with patient

## 2020-02-04 ENCOUNTER — Telehealth: Payer: Self-pay | Admitting: Allergy

## 2020-02-04 NOTE — Telephone Encounter (Signed)
[facility] called needing additional information to process a prior authorization for opzelura. Call back number is [phone].  Please advise.

## 2020-02-08 NOTE — Telephone Encounter (Signed)
Prior authorization for Opzelura submitted on covermymeds.

## 2020-02-09 ENCOUNTER — Other Ambulatory Visit: Payer: Self-pay | Admitting: Allergy

## 2020-02-09 MED ORDER — PIMECROLIMUS 1 % EX CREA: TOPICAL_CREAM | Freq: Two times a day (BID) | CUTANEOUS | 5 refills | Status: DC

## 2020-02-09 NOTE — Telephone Encounter (Signed)
Patient's insurance has unfortunately denied coverage of the Opzelura cream. They did not specify a reason but did give a number if the provider would like to call and appeal. [phone]. Reference number: [id number]

## 2020-02-09 NOTE — Telephone Encounter (Signed)
Was hoping we would have samples to provide her of the [place] but we did not.  Since her insurance is not covering it lets go ahead and try Elidel another nonsteroid option that should be covered.  If she fails a steroid and nonsteroidal then she might be able to get Opzelura covered.

## 2020-02-09 NOTE — Telephone Encounter (Signed)
[facility] and patient verbalized understanding.

## 2020-02-11 ENCOUNTER — Telehealth: Payer: Self-pay | Admitting: *Deleted

## 2020-02-11 NOTE — Telephone Encounter (Signed)
Every thing on the formulary is a tier 3 only thing a tier 1 is hydrocortisone 2.5%

## 2020-02-11 NOTE — Telephone Encounter (Signed)
So if she tries and fails HC 2.5% then she'll have more options to get covered?  I think that will be the best route.   Her insurance does not seem to be the best as they are denying very old medications as well.

## 2020-02-11 NOTE — Telephone Encounter (Signed)
Patient called and states her insurance denied both creams, Opzelura and Elidel, as well as Dupixent. Patient would like to know the next steps.   Please advise.

## 2020-02-11 NOTE — Telephone Encounter (Signed)
Yes I recommended she try a non-steroidal (elidel) but it is not covered either.   The only steroid covered per the CMA with her plan is hydrocortisone 2.5% so Im gonna have her try so we can get something else covered.    Her insurance plan must be awful.

## 2020-02-11 NOTE — Telephone Encounter (Signed)
Patient called and advised she had received denial for Dupixent.  I advised her new policy for [facility] has to have tried and failed [place] or [place] to be considered for [place] for asthma.  Patient has tried topicals for her atopic dermatitis but not elidel or protopic which is requirement for indication.  She would like to try one of these medications to get atopic dermatitis indication since her topicals she is using are not helping her skin

## 2020-02-12 MED ORDER — HYDROCORTISONE 2.5 % EX OINT: TOPICAL_OINTMENT | Freq: Two times a day (BID) | CUTANEOUS | 5 refills | Status: AC

## 2020-02-12 NOTE — Telephone Encounter (Signed)
Hydrocortisone 2.5% sent into pharmacy to replace Elidel.

## 2020-02-12 NOTE — Addendum Note (Signed)
Addended by: Valere Dross on: 02/12/2020 08:47 AM   Modules accepted: Orders

## 2020-02-17 NOTE — Telephone Encounter (Signed)
I called and advised patient of trial with Elidel in order for Ins to consider Dupixent for atopic dermatitis

## 2020-02-22 ENCOUNTER — Telehealth: Payer: Self-pay | Admitting: Allergy

## 2020-02-22 NOTE — Telephone Encounter (Signed)
Cover [employer] Meds called about a prior authorization that was denied for Opzelura. [phone] Reference #: [id number]

## 2020-02-23 ENCOUNTER — Other Ambulatory Visit: Payer: Self-pay | Admitting: Family Medicine

## 2020-02-23 NOTE — Telephone Encounter (Signed)
Please advise change in medication.  °

## 2020-02-23 NOTE — Telephone Encounter (Signed)
Tried to call pt but mailbox is full currently

## 2020-02-23 NOTE — Telephone Encounter (Signed)
Yes her insurance has denied most everything we have sent in at this point in time besides hydrocortisone 2.5% (which apparently is the only tier 1 option, see previous telephone notes).  It was advised that she try this and if she does not have any improvements with this medication (which I am guessing she will not) then we can try to get Elidel covered once she has tried and failed hydrocortisone.  Please see if she has been able to pick up the hydrocortisone and use it and if she is having any improvements.

## 2020-02-24 ENCOUNTER — Encounter: Payer: Self-pay | Admitting: Internal Medicine

## 2020-02-24 ENCOUNTER — Ambulatory Visit (INDEPENDENT_AMBULATORY_CARE_PROVIDER_SITE_OTHER): Payer: Medicare Other | Admitting: Internal Medicine

## 2020-02-24 ENCOUNTER — Other Ambulatory Visit: Payer: Self-pay

## 2020-02-24 VITALS — BP 132/80 | HR 75 | Temp 98.9°F | Resp 18 | Ht 61.0 in | Wt 195.6 lb

## 2020-02-24 DIAGNOSIS — R195 Other fecal abnormalities: Secondary | ICD-10-CM | POA: Diagnosis not present

## 2020-02-24 DIAGNOSIS — R1030 Lower abdominal pain, unspecified: Secondary | ICD-10-CM | POA: Diagnosis not present

## 2020-02-24 DIAGNOSIS — R7303 Prediabetes: Secondary | ICD-10-CM | POA: Diagnosis not present

## 2020-02-24 LAB — CBC
HCT: 48.2 % — ABNORMAL HIGH (ref 36.0–46.0)
Hemoglobin: 16.1 g/dL — ABNORMAL HIGH (ref 12.0–15.0)
MCHC: 33.5 g/dL (ref 30.0–36.0)
MCV: 88.5 fl (ref 78.0–100.0)
Platelets: 220 10*3/uL (ref 150.0–400.0)
RBC: 5.45 Mil/uL — ABNORMAL HIGH (ref 3.87–5.11)
RDW: 14.1 % (ref 11.5–15.5)
WBC: 6.4 10*3/uL (ref 4.0–10.5)

## 2020-02-24 LAB — COMPREHENSIVE METABOLIC PANEL
ALT: 47 U/L — ABNORMAL HIGH (ref 0–35)
AST: 49 U/L — ABNORMAL HIGH (ref 0–37)
Albumin: 4.6 g/dL (ref 3.5–5.2)
Alkaline Phosphatase: 95 U/L (ref 39–117)
BUN: 9 mg/dL (ref 6–23)
CO2: 31 mEq/L (ref 19–32)
Calcium: 9.9 mg/dL (ref 8.4–10.5)
Chloride: 99 mEq/L (ref 96–112)
Creatinine, Ser: 0.67 mg/dL (ref 0.40–1.20)
GFR: 89.51 mL/min (ref >60.00)
Glucose, Bld: 125 mg/dL — ABNORMAL HIGH (ref 70–99)
Potassium: 4.5 mEq/L (ref 3.5–5.1)
Sodium: 138 mEq/L (ref 135–145)
Total Bilirubin: 0.9 mg/dL (ref 0.2–1.2)
Total Protein: 7.7 g/dL (ref 6.0–8.3)

## 2020-02-24 LAB — HEMOGLOBIN A1C: Hgb A1c MFr Bld: 6.6 % — ABNORMAL HIGH (ref 4.6–6.5)

## 2020-02-24 LAB — LIPID PANEL
Cholesterol: 159 mg/dL (ref 0–200)
HDL: 48.9 mg/dL (ref >39.00)
LDL Cholesterol: 86 mg/dL (ref 0–99)
NonHDL: 110.23
Total CHOL/HDL Ratio: 3
Triglycerides: 120 mg/dL (ref 0.0–149.0)
VLDL: 24 mg/dL (ref 0.0–40.0)

## 2020-02-24 LAB — FERRITIN: Ferritin: 83.5 ng/mL (ref 10.0–291.0)

## 2020-02-24 NOTE — Patient Instructions (Signed)
We will check the labs today and the CT scan of the stomach to check.

## 2020-02-24 NOTE — Progress Notes (Signed)
   Subjective:   Patient ID: Toni Hicks, female    DOB: 16-Dec-1950, 70 y.o.   MRN: [id number]  HPI The patient is a 70 YO female coming in for abdominal pain going on for several weeks. Mostly in the low stomach and relieved with BM. She has had dark/black stools in the last 3-5 days. She did drink some pepto bismol prior to onset but black stool for days with every bowel movement. None today or yesterday. She denies recent weight loss. No prior colonoscopy. Denies change in diet. Denies nausea or vomiting. Does routinely have multiple BM per day and this has been present for years.   Review of Systems  Constitutional: Negative.   HENT: Negative.   Eyes: Negative.   Respiratory: Negative for cough, chest tightness and shortness of breath.   Cardiovascular: Negative for chest pain, palpitations and leg swelling.  Gastrointestinal: Positive for abdominal pain and blood in stool. Negative for abdominal distention, anal bleeding, constipation, diarrhea, nausea, rectal pain and vomiting.  Musculoskeletal: Negative.   Skin: Negative.   Neurological: Negative.   Psychiatric/Behavioral: Negative.     Objective:  Physical Exam Constitutional:      Appearance: She is well-developed and well-nourished. She is obese.  HENT:     Head: Normocephalic and atraumatic.  Eyes:     Extraocular Movements: EOM normal.  Cardiovascular:     Rate and Rhythm: Normal rate and regular rhythm.  Pulmonary:     Effort: Pulmonary effort is normal. No respiratory distress.     Breath sounds: Normal breath sounds. No wheezing or rales.  Abdominal:     General: Bowel sounds are normal. There is no distension.     Palpations: Abdomen is soft.     Tenderness: There is no abdominal tenderness. There is no rebound.  Musculoskeletal:        General: No edema.     Cervical back: Normal range of motion.  Skin:    General: Skin is warm and dry.  Neurological:     Mental Status: She is alert and oriented to  person, place, and time.     Coordination: Coordination normal.  Psychiatric:        Mood and Affect: Mood and affect normal.     Vitals:   02/24/20 0850  BP: 132/80  Pulse: 75  Resp: 18  Temp: 98.9 F (37.2 C)  TempSrc: Oral  SpO2: 98%  Weight: 195 lb 9.6 oz (88.7 kg)  Height: 5\' 1"  (1.549 m)    This visit occurred during the SARS-CoV-2 public health emergency.  Safety protocols were in place, including screening questions prior to the visit, additional usage of staff PPE, and extensive cleaning of exam room while observing appropriate contact time as indicated for disinfecting solutions.   Assessment & Plan:  Visit time 25 minutes in face to face communication with patient and coordination of care, additional 10 minutes spent in record review, coordination or care, ordering tests, communicating/referring to other healthcare professionals, documenting in medical records all on the same day of the visit for total time 35 minutes spent on the visit.

## 2020-02-25 ENCOUNTER — Telehealth: Payer: Self-pay

## 2020-02-25 NOTE — Telephone Encounter (Signed)
Called and tried to leave a voice mail for patient but her mailbox was full. Patient is be be informed that her opzelura was approved after appeal and sent to her pharmacy. Approval forms have been faxed to pharmacy [facility].

## 2020-02-26 DIAGNOSIS — R1030 Lower abdominal pain, unspecified: Secondary | ICD-10-CM | POA: Insufficient documentation

## 2020-02-26 DIAGNOSIS — R195 Other fecal abnormalities: Secondary | ICD-10-CM | POA: Insufficient documentation

## 2020-02-26 NOTE — Assessment & Plan Note (Signed)
Checking HgA1c and adjust as needed.  

## 2020-02-26 NOTE — Assessment & Plan Note (Signed)
Checking CBC, recommended GI referral for colonoscopy but she declines. Does agree to CT abdomen and pelvis to assess abdominal pain so ordered CMP and CT abdomen/pelvis.

## 2020-02-26 NOTE — Assessment & Plan Note (Signed)
Checking CBC, CMP, CT abdomen and pelvis. Recommended referral to GI for colonoscopy but she declines.

## 2020-03-08 ENCOUNTER — Encounter: Payer: Self-pay | Admitting: Internal Medicine

## 2020-03-08 NOTE — Progress Notes (Deleted)
HPI F former smoker followed for OSA, complicated by Restless Legs, ETOH abuse, ETOH induced insomnia, Allergic rhinitis/ conjunctivitis, Nasal polyps, anxiety, depression, HBP, CVA, Hypothyroid, Asthma, Urticaria, DM NPSG Dohmeier 04/08/2017-  AHI 11.25/ hr, desaturation to 53%, 174 lbs Office Spirometry 03/20/2018 (Allergy and Asthma) Moderate obstruction, with FVC 1.84/ 70%, FEV1 1.09/ 54%, ratio 0.59/ 77%, FEF25-75% 0.44/ 24%. PFT- 05/22/19- Mod Obst, Mod restriction, Nl Diffusion, Insignif resp to BD ---------------------------------------------------------------------------------------------------   12/09/19- 69 yoF former smoker followed for COPD, hx recurrent pneumonias,  complicated by OSA ( Dr Dohmeier/ [facility]), Restless Legs, ETOH abuse, ETOH induced insomnia, Allergic rhinitis/ conjunctivitis, Nasal polyps, anxiety, depression, HBP, CVA, Hypothyroid,  Urticaria, DM CPAP 9/ [facility]- followed by [facility]/ Dr Maureen Chatters -----COPD--having bad days more often.  even with her meds.  Albuterol hfa, singulair, Trelegy 100,  NP visit 09/16/19- reported worsening SOB and cough over previous 2 months. W/o response to doxy/ prednisone. Was given augmentin/ prednisone 9/8, which didn't help either. .  Has hx nasal polyps. She was encouraged to f/u with ENT and with Dr Padgett/ Allergy. Has now seen Dr Redmond Baseman ENT who plans septal plug for septal perforation she says she caused by picking at her nose. Persistent nasal congestion apt to clear by mid-day.  More aware of DOE carrying out trash/ ADLs. No blood, fever or acute event. Our lab records don't include testing for CF, a1AT def or granulomatous vasculitis/ Wegener's.  She describes "pneumonias since birth" with many tests done. Many times tested positive for common environmental allergies. Long hx eczema now more extensive. Sensitive to cold air.  Uses Flutter device daily for scant phlegm.  Sputum cx 9/21- Nl Flora,  AFB +M. Gordonae Lab 09/16/19- IgE 326 H, EOS  wnl (recent prednisone). Covid vax- 3 Phizer Flu vax- declines Pneumonia vaccine- declines- fear of allergy to "additives". CXR 09/16/19-  IMPRESSION: Diffuse bronchitic changes. No focal pulmonary infiltrate.  03/09/20-  69 yoF former smoker followed for Asthma/COPD overlap, hx recurrent pneumonias,  complicated by OSA ( Dr Dohmeier/ [facility]), Restless Legs, ETOH abuse, ETOH induced insomnia, Allergic rhinitis/ conjunctivitis, Nasal polyps, anxiety, depression, HBP, CVA, Hypothyroid,  Urticaria, DM CPAP 9/ [facility]- followed by [facility]/ Dr Maureen Chatters -Albuterol hfa, singulair, Trelegy 100, Neb albuterol,  Sputum Cx 09/17/19- M.gordonae +, sens to amikacin, cipro, imipenem, linezolid, moxifloxacin, R to biaxin Lab- a1AT 12/09/19- WNL 179 Followed by Dr Padgett/ Alllergy (also eczema, allergic rhinitis, prior nasal polyps) We had ordered Dupixent but she has not heard anything. Dr Redmond Baseman plans septal plug for perforation- does not have polyps.   ROS-see HPI   + = positive Constitutional:    weight loss, night sweats, fevers, chills, fatigue, lassitude. HEENT:    headaches, difficulty swallowing, tooth/dental problems, sore throat,       sneezing, itching, ear ache, +nasal congestion, post nasal drip, snoring CV:    chest pain, orthopnea, PND, swelling in lower extremities, anasarca,                                  dizziness, +palpitations Resp:   +shortness of breath with exertion or at rest.                +productive cough,   non-productive cough, coughing up of blood.              change in color of mucus.  wheezing.   Skin:    +rash or lesions. GI:  heartburn, +indigestion, abdominal pain, nausea, vomiting, diarrhea,                 change in bowel habits, loss of appetite GU: dysuria, change in color of urine, no urgency or frequency.   flank pain. MS:   joint pain, +stiffness, decreased range of motion, back pain. Neuro-     nothing unusual Psych:  change in mood or affect.  depression or  +anxiety.   memory loss.  OBJ- Physical Exam General- Alert, Oriented, Affect-appropriate, Distress- none acute, +obese Skin- + eczema antecubital, calves Lymphadenopathy- none Head- atraumatic            Eyes- Gross vision intact, PERRLA, conjunctivae and secretions clear            Ears- Hearing, canals-normal            Nose- Clear, no-Septal dev, mucus, polyps, erosion, perforation             Throat- Mallampati II , mucosa clear , drainage- none, tonsils- atrophic Neck- flexible , trachea midline, no stridor , thyroid nl, carotid no bruit Chest - symmetrical excursion , unlabored           Heart/CV- RRR , no murmur , no gallop  , no rub, nl s1 s2                           - JVD- none , edema- none, stasis changes- none, varices- none           Lung- diminished, wheeze-none, cough+loose/ coarse , dullness-none, rub- none           Chest wall-  Abd-  Br/ Gen/ Rectal- Not done, not indicated Extrem- cyanosis- none, clubbing, none, atrophy- none, strength- nl Neuro- grossly intact to observation

## 2020-03-09 ENCOUNTER — Ambulatory Visit: Payer: Medicare Other | Admitting: Internal Medicine

## 2020-03-09 ENCOUNTER — Telehealth: Payer: Self-pay | Admitting: Pharmacy Technician

## 2020-03-09 DIAGNOSIS — Z79899 Other long term (current) drug therapy: Secondary | ICD-10-CM

## 2020-03-09 NOTE — Telephone Encounter (Signed)
Started PA request for Dupixent and received the following question for plan preferred options:  Has the patient had a failure, contraindication, or intolerance to:   Fasenra/ Fasenra Pen or [employer]

## 2020-03-09 NOTE — Telephone Encounter (Signed)
Received notification for Toni Hicks. Will update as we work through the benefits process.  Order per chart notes:   Order- start [place].   Begin injections here.    Dx Chronic asthmatic bronchitis, nasal polyps, Eczema,

## 2020-03-10 ENCOUNTER — Telehealth: Payer: Self-pay | Admitting: Internal Medicine

## 2020-03-10 MED ORDER — AMLODIPINE BESYLATE 5 MG PO TABS: 5.0000 mg | ORAL_TABLET | Freq: Every day | ORAL | 0 refills | Status: DC

## 2020-03-10 NOTE — Telephone Encounter (Signed)
Medication has been sent to the patient's pharmacy.  

## 2020-03-10 NOTE — Telephone Encounter (Addendum)
Submitted a Prior Authorization request to [facility] for [place] via Cover My Meds. Will update once we receive a response.  Patient's allergist was also trying to get [place] approved for atopic dermatitis. Will appeal if needed since she has several indications including history of nasal polyps making Dupixent the best option compared to Fasenra vs. Nucala.  (KeyMarland Kitchen [id number]) - [id number]

## 2020-03-10 NOTE — Telephone Encounter (Signed)
1.Medication Requested: amLODipine (NORVASC) 5 MG tablet    2. Pharmacy (Name, Street, [place]): [id number] - [place], [place]  3. On Med List: yes   4. Last Visit with PCP: 2.16.22  5. Next visit date with PCP: n/a    Agent: Please be advised that RX refills may take up to 3 business days. We ask that you follow-up with your pharmacy.

## 2020-03-10 NOTE — Telephone Encounter (Signed)
Oh fantastic thanks for the update.  Atopic derm should get the [place] approved.  If you need topical therapies she has tried we can provide that if that is needed for approval.

## 2020-03-11 NOTE — Telephone Encounter (Signed)
Received a fax regarding Prior Authorization from [facility] for [place]. Authorization has been DENIED because plan requires trial of 1 preferred: [place] and Fasenra. Plan requires additional medical reasons why at least 1 of the preferred is not appropriate for patient .

## 2020-03-14 NOTE — Progress Notes (Deleted)
HPI F former smoker followed for OSA, complicated by Restless Legs, ETOH abuse, ETOH induced insomnia, Allergic rhinitis/ conjunctivitis, Nasal polyps, anxiety, depression, HBP, CVA, Hypothyroid, Asthma, Urticaria, DM NPSG Dohmeier 04/08/2017-  AHI 11.25/ hr, desaturation to 53%, 174 lbs Office Spirometry 03/20/2018 (Allergy and Asthma) Moderate obstruction, with FVC 1.84/ 70%, FEV1 1.09/ 54%, ratio 0.59/ 77%, FEF25-75% 0.44/ 24%. PFT- 05/22/19- Mod Obst, Mod restriction, Nl Diffusion, Insignif resp to BD ---------------------------------------------------------------------------------------------------   12/09/19- 69 yoF former smoker followed for COPD, hx recurrent pneumonias,  complicated by OSA ( Dr Dohmeier/ [facility]), Restless Legs, ETOH abuse, ETOH induced insomnia, Allergic rhinitis/ conjunctivitis, Nasal polyps, anxiety, depression, HBP, CVA, Hypothyroid,  Urticaria, DM CPAP 9/ [facility]- followed by [facility]/ Dr Maureen Chatters -----COPD--having bad days more often.  even with her meds.  Albuterol hfa, singulair, Trelegy 100,  NP visit 09/16/19- reported worsening SOB and cough over previous 2 months. W/o response to doxy/ prednisone. Was given augmentin/ prednisone 9/8, which didn't help either. .  Has hx nasal polyps. She was encouraged to f/u with ENT and with Dr Padgett/ Allergy. Has now seen Dr Redmond Baseman ENT who plans septal plug for septal perforation she says she caused by picking at her nose. Persistent nasal congestion apt to clear by mid-day.  More aware of DOE carrying out trash/ ADLs. No blood, fever or acute event. Our lab records don't include testing for CF, a1AT def or granulomatous vasculitis/ Wegener's.  She describes "pneumonias since birth" with many tests done. Many times tested positive for common environmental allergies. Long hx eczema now more extensive. Sensitive to cold air.  Uses Flutter device daily for scant phlegm.  Sputum cx 9/21- Nl Flora,  AFB +M. Gordonae Lab 09/16/19- IgE 326 H, EOS  wnl (recent prednisone). Covid vax- 3 Phizer Flu vax- declines Pneumonia vaccine- declines- fear of allergy to "additives". CXR 09/16/19-  IMPRESSION: Diffuse bronchitic changes. No focal pulmonary infiltrate.  03/15/20- 69 yoF former smoker followed for COPD, hx recurrent pneumonias,  complicated by OSA ( Dr Dohmeier/ [facility]), Restless Legs, ETOH abuse, ETOH induced insomnia, Allergic rhinitis/ conjunctivitis, Nasal polyps, anxiety, depression, HBP, CVA, Hypothyroid,  Urticaria, DM CPAP 9/ [facility]- followed by [facility]/ Dr Maureen Chatters Allergies followed by Dr Prudy Feeler Dupixent PA filled for dx Atopic Dermatitis,, hx Nasal Polyps, hx "recurrent pneumonias" vs Asthmatic Bronchitis Sputum Cx 12/11/19 Pos M.gordonae with susceptibilities reported for quinolones and IV meds Covid vax- 3 Phizer Flu vax- declines Pneumonia vaccine- declines- fear of allergy to "additives".  ROS-see HPI   + = positive Constitutional:    weight loss, night sweats, fevers, chills, fatigue, lassitude. HEENT:    headaches, difficulty swallowing, tooth/dental problems, sore throat,       sneezing, itching, ear ache, +nasal congestion, post nasal drip, snoring CV:    chest pain, orthopnea, PND, swelling in lower extremities, anasarca,                                   dizziness, +palpitations Resp:   +shortness of breath with exertion or at rest.                +productive cough,   non-productive cough, coughing up of blood.              change in color of mucus.  wheezing.   Skin:    +rash or lesions. GI:    heartburn, +indigestion, abdominal pain, nausea, vomiting, diarrhea,  change in bowel habits, loss of appetite GU: dysuria, change in color of urine, no urgency or frequency.   flank pain. MS:   joint pain, +stiffness, decreased range of motion, back pain. Neuro-     nothing unusual Psych:  change in mood or affect.  depression or +anxiety.   memory loss.  OBJ- Physical Exam General- Alert, Oriented,  Affect-appropriate, Distress- none acute, +obese Skin- + eczema antecubital, calves Lymphadenopathy- none Head- atraumatic            Eyes- Gross vision intact, PERRLA, conjunctivae and secretions clear            Ears- Hearing, canals-normal            Nose- Clear, no-Septal dev, mucus, polyps, erosion, perforation             Throat- Mallampati II , mucosa clear , drainage- none, tonsils- atrophic Neck- flexible , trachea midline, no stridor , thyroid nl, carotid no bruit Chest - symmetrical excursion , unlabored           Heart/CV- RRR , no murmur , no gallop  , no rub, nl s1 s2                           - JVD- none , edema- none, stasis changes- none, varices- none           Lung- diminished, wheeze-none, cough+loose/ coarse , dullness-none, rub- none           Chest wall-  Abd-  Br/ Gen/ Rectal- Not done, not indicated Extrem- cyanosis- none, clubbing, none, atrophy- none, strength- nl Neuro- grossly intact to observation

## 2020-03-15 ENCOUNTER — Telehealth: Payer: Self-pay

## 2020-03-15 ENCOUNTER — Ambulatory Visit: Payer: Medicare Other | Admitting: Internal Medicine

## 2020-03-15 NOTE — Telephone Encounter (Signed)
ATC patient to see if she wanted to come by the office to fill out paperwork since her appointment was rescheduled or if she wants it mailed to her. LMTCB

## 2020-03-18 NOTE — Telephone Encounter (Addendum)
Appeal faxed to [facility] department. Given patient's comorbid atopic dermatitis, Dupixent is the best option for her.  Neither Fasenra nor Nucala is FDA-approved for atopic dermatitis.  Fax: [phone] Phone: [phone]  Knox Saliva, PharmD, MPH Clinical Pharmacist (Rheumatology and Pulmonology)

## 2020-03-23 ENCOUNTER — Other Ambulatory Visit: Payer: Self-pay

## 2020-03-23 ENCOUNTER — Ambulatory Visit
Admission: RE | Admit: 2020-03-23 | Discharge: 2020-03-23 | Disposition: A | Discharge status: Home / Self Care | Payer: Medicare Other | Source: Ambulatory Visit | Attending: Internal Medicine | Admitting: Internal Medicine

## 2020-03-23 ENCOUNTER — Other Ambulatory Visit: Payer: Self-pay | Admitting: Allergy

## 2020-03-23 DIAGNOSIS — R1030 Lower abdominal pain, unspecified: Secondary | ICD-10-CM | POA: Diagnosis not present

## 2020-03-23 DIAGNOSIS — R911 Solitary pulmonary nodule: Secondary | ICD-10-CM | POA: Diagnosis not present

## 2020-03-23 DIAGNOSIS — I700 Atherosclerosis of aorta: Secondary | ICD-10-CM | POA: Diagnosis not present

## 2020-03-23 DIAGNOSIS — R195 Other fecal abnormalities: Secondary | ICD-10-CM | POA: Diagnosis not present

## 2020-03-23 MED ORDER — IOPAMIDOL (ISOVUE-300) INJECTION 61%
100.0000 mL | Freq: Once | INTRAVENOUS | Status: AC | PRN
  Administered 2020-03-23: 100 mL via INTRAVENOUS

## 2020-03-23 NOTE — Telephone Encounter (Signed)
Called patient to see if she wanted to schedule an appointment in order to get a 30 day courtesy refill for singular. I left her a message

## 2020-03-23 NOTE — Telephone Encounter (Signed)
Go ahead and refill this.   She is not yet due for an OV.   Recommended 4 month f/u at her January 2022 visit

## 2020-03-31 ENCOUNTER — Other Ambulatory Visit: Payer: Self-pay | Admitting: Family Medicine

## 2020-03-31 NOTE — Telephone Encounter (Signed)
Received notification from [facility] regarding a prior authorization for [place]. Authorization has been APPROVED from 03/18/20 to 01/07/21.   Authorization # [id number]  Ran test claim, patient has $100 copay. Patient has Medicare plan and can voice to [place] [employer] that she is unable to afford copay.  Office mailed patient [place] enrollment forms. Called to follow up, left message.

## 2020-04-07 NOTE — Telephone Encounter (Signed)
Left VM for patient to return call regarding Dupixent. Will continue to f/u

## 2020-04-13 NOTE — Telephone Encounter (Signed)
Left VM regarding Dupixent and enrollment into Dupixent [employer] d/t $100 copay - forms were mailed to her. She may be bringing these forms to the clinic with her. Has appointment with Dr. Annamaria Boots on 04/14/20.  Routing to Dr. Annamaria Boots - she can complete Dupixent [employer] forms again at her OV to prevent extra trip to clinic. Income docs not required (just needs to write down her income)

## 2020-04-13 NOTE — Progress Notes (Signed)
HPI F former smoker followed for OSA, complicated by Restless Legs, ETOH abuse, ETOH induced insomnia, Allergic rhinitis/ conjunctivitis, Nasal polyps, anxiety, depression, HBP, CVA, Hypothyroid, Asthma, Urticaria, DM NPSG Dohmeier 04/08/2017-  AHI 11.25/ hr, desaturation to 53%, 174 lbs Office Spirometry 03/20/2018 (Allergy and Asthma) Moderate obstruction, with FVC 1.84/ 70%, FEV1 1.09/ 54%, ratio 0.59/ 77%, FEF25-75% 0.44/ 24%. 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  even with her meds.  Albuterol hfa, singulair, Trelegy 100,  NP visit 09/16/19- reported worsening SOB and cough over previous 2 months. W/o response to doxy/ prednisone. Was given augmentin/ prednisone 9/8, which didn't help either. .  Has hx nasal polyps. She was encouraged to f/u with ENT and with Dr Padgett/ Allergy. Has now seen Dr Redmond Baseman ENT who plans septal plug for septal perforation she says she caused by picking at her nose. Persistent nasal congestion apt to clear by mid-day.  More aware of DOE carrying out trash/ ADLs. No blood, fever or acute event. Our lab records don't include testing for CF, a1AT def or granulomatous vasculitis/ Wegener's.  She describes "pneumonias since birth" with many tests done. Many times tested positive for common environmental allergies. Long hx eczema now more extensive. Sensitive to  cold air.  Uses Flutter device daily for scant phlegm.  Sputum cx 9/21- Nl Flora,  AFB +M. Gordonae Lab 09/16/19- IgE 326 H, EOS wnl (recent prednisone). Covid vax- 3 Phizer Flu vax- declines Pneumonia vaccine- declines- fear of allergy to "additives". CXR 09/16/19-  IMPRESSION: Diffuse bronchitic changes. No focal pulmonary infiltrate.   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 Usimg rescue hfa 2-3x/ day, continuing Trelegy and Singulair.  Does report morning cough with much postnasal drip. Less SHOB, cough and wheeze. Declines pneumonia vaccine.   ROS-see HPI   + = positive Constitutional:    weight loss, night sweats, fevers, chills, fatigue, lassitude. HEENT:    headaches, difficulty swallowing, tooth/dental problems, sore throat,       sneezing, itching, ear ache, +nasal congestion, post nasal drip, snoring CV:    chest pain, orthopnea, PND, swelling in lower extremities, anasarca,                                   dizziness, +palpitations Resp:   +shortness of breath with exertion or at rest.                +productive  cough,   non-productive cough, coughing up of blood.              change in color of mucus.  wheezing.   Skin:    +rash or lesions. GI:    heartburn, +indigestion, abdominal pain, nausea, vomiting, diarrhea,                  change in bowel habits, loss of appetite GU: dysuria, change in color of urine, no urgency or frequency.   flank pain. MS:   joint pain, +stiffness, decreased range of motion, back pain. Neuro-     nothing unusual Psych:  change in mood or affect.  depression or +anxiety.   memory loss.  OBJ- Physical Exam General- Alert, Oriented, Affect-appropriate, Distress- none acute, +obese Skin- + eczema antecubital, calves Lymphadenopathy- none Head- atraumatic            Eyes- Gross vision intact, PERRLA, conjunctivae and secretions clear            Ears- Hearing, canals-normal            Nose- Clear, no-Septal dev, mucus, polyps, erosion, perforation             Throat- Mallampati II , mucosa clear , drainage- none, tonsils- atrophic Neck- flexible , trachea midline, no stridor , thyroid nl, carotid no bruit Chest - symmetrical excursion , unlabored           Heart/CV- RRR , no murmur , no gallop  , no rub, nl s1 s2                           - JVD- none , edema- none, stasis changes- none, varices- none           Lung- diminished, wheeze-none, cough+loose/ coarse , dullness-none, rub- none           Chest wall-  Abd-  Br/ Gen/ Rectal- Not done, not indicated Extrem- cyanosis- none, clubbing, none, atrophy- none, strength- nl Neuro- grossly intact to observation

## 2020-04-14 ENCOUNTER — Other Ambulatory Visit: Payer: Self-pay

## 2020-04-14 ENCOUNTER — Encounter: Payer: Self-pay | Admitting: Internal Medicine

## 2020-04-14 ENCOUNTER — Ambulatory Visit: Payer: Medicare Other | Admitting: Internal Medicine

## 2020-04-14 VITALS — BP 128/78 | HR 81 | Temp 98.0°F | Ht 61.0 in | Wt 198.0 lb

## 2020-04-14 DIAGNOSIS — D72119 Hypereosinophilic syndrome (hes), unspecified: Secondary | ICD-10-CM | POA: Diagnosis not present

## 2020-04-14 DIAGNOSIS — R911 Solitary pulmonary nodule: Secondary | ICD-10-CM | POA: Diagnosis not present

## 2020-04-14 DIAGNOSIS — J329 Chronic sinusitis, unspecified: Secondary | ICD-10-CM

## 2020-04-14 DIAGNOSIS — J4540 Moderate persistent asthma, uncomplicated: Secondary | ICD-10-CM | POA: Diagnosis not present

## 2020-04-14 DIAGNOSIS — J189 Pneumonia, unspecified organism: Secondary | ICD-10-CM

## 2020-04-14 DIAGNOSIS — J449 Chronic obstructive pulmonary disease, unspecified: Secondary | ICD-10-CM

## 2020-04-14 NOTE — Patient Instructions (Signed)
We will complete the application for Dupixent  Order- Lab- [facility]     Dx Hyper-eosinophilia  Order- CT scan of chest, no contrast      Dx lung nodule on CT abdomen              CT max fac     Dx Chronic sinusitis

## 2020-04-14 NOTE — Telephone Encounter (Signed)
Submitted Patient Assistance Application to [employer] for [place] along with provider portion. Will update patient when we receive a response.  Fax# [phone] Phone# [phone]

## 2020-04-15 ENCOUNTER — Other Ambulatory Visit: Payer: Self-pay

## 2020-04-15 ENCOUNTER — Encounter: Payer: Self-pay | Admitting: Internal Medicine

## 2020-04-15 ENCOUNTER — Ambulatory Visit (INDEPENDENT_AMBULATORY_CARE_PROVIDER_SITE_OTHER): Payer: Medicare Other | Admitting: Internal Medicine

## 2020-04-15 VITALS — BP 122/70 | HR 75 | Temp 99.0°F | Resp 18 | Ht 61.0 in | Wt 200.0 lb

## 2020-04-15 DIAGNOSIS — R7303 Prediabetes: Secondary | ICD-10-CM

## 2020-04-15 DIAGNOSIS — R195 Other fecal abnormalities: Secondary | ICD-10-CM | POA: Diagnosis not present

## 2020-04-15 DIAGNOSIS — L308 Other specified dermatitis: Secondary | ICD-10-CM

## 2020-04-15 DIAGNOSIS — R21 Rash and other nonspecific skin eruption: Secondary | ICD-10-CM

## 2020-04-15 LAB — ANCA SCREEN W REFLEX TITER: ANCA Screen: NEGATIVE

## 2020-04-15 MED ORDER — METFORMIN HCL 500 MG PO TABS: 500.0000 mg | ORAL_TABLET | Freq: Every day | ORAL | 3 refills | Status: DC

## 2020-04-15 MED ORDER — CLOTRIMAZOLE-BETAMETHASONE 1-0.05 % EX CREA: 1.0000 "application " | TOPICAL_CREAM | Freq: Two times a day (BID) | CUTANEOUS | 0 refills | Status: DC

## 2020-04-15 NOTE — Progress Notes (Signed)
   Subjective:   Patient ID: Toni Hicks, female    DOB: Mar 25, 1950, 70 y.o.   MRN: [id number]  HPI The patient is a 70 YO female coming in for several concerns including dark stools (denies recent episodes, rare drops of blood with hemorrhoids, wants to go over CT findings, declines colonoscopy), and sugars (most recent in diabetes range, previous pre-diabetes, she would like to start metformin to help with weight loss and insulin resistance, has been trying to diet without success), and rash (concerned about ringworm, has had this rash for years, has been using otc medication which has helped some, denies worsening, it is not sun sensitive).   Review of Systems  Constitutional: Negative.   HENT: Negative.   Eyes: Negative.   Respiratory: Negative for cough, chest tightness and shortness of breath.   Cardiovascular: Negative for chest pain, palpitations and leg swelling.  Gastrointestinal: Positive for blood in stool. Negative for abdominal distention, abdominal pain, constipation, diarrhea, nausea and vomiting.  Musculoskeletal: Negative.   Skin: Positive for rash.  Neurological: Negative.   Psychiatric/Behavioral: Negative.     Objective:  Physical Exam Constitutional:      Appearance: She is well-developed. She is obese.  HENT:     Head: Normocephalic and atraumatic.  Cardiovascular:     Rate and Rhythm: Normal rate and regular rhythm.  Pulmonary:     Effort: Pulmonary effort is normal. No respiratory distress.     Breath sounds: Normal breath sounds. No wheezing or rales.  Abdominal:     General: Bowel sounds are normal. There is no distension.     Palpations: Abdomen is soft.     Tenderness: There is no abdominal tenderness. There is no rebound.  Musculoskeletal:     Cervical back: Normal range of motion.  Skin:    General: Skin is warm and dry.     Findings: Rash present.     Comments: Rash right ankle with a few satellite lesions  Neurological:     Mental Status:  She is alert and oriented to person, place, and time.     Coordination: Coordination normal.     Vitals:   04/15/20 1405  BP: 122/70  Pulse: 75  Resp: 18  Temp: 99 F (37.2 C)  TempSrc: Oral  SpO2: 95%  Weight: 200 lb (90.7 kg)  Height: 5\' 1"  (1.549 m)    This visit occurred during the SARS-CoV-2 public health emergency.  Safety protocols were in place, including screening questions prior to the visit, additional usage of staff PPE, and extensive cleaning of exam room while observing appropriate contact time as indicated for disinfecting solutions.   Assessment & Plan:

## 2020-04-15 NOTE — Assessment & Plan Note (Signed)
Advised that the rash on her foot is not ringworm. Rx lotrisone to cover rash and use BID.

## 2020-04-15 NOTE — Assessment & Plan Note (Signed)
Will start metformin 500 mg daily and reassess in 3 months. If still HgA1c in diabetes range will have new diabetes and she is aware.

## 2020-04-15 NOTE — Patient Instructions (Addendum)
We will start you on the metformin to take 1 pill daily to help with the sugars and weight loss.   Think about the colonoscopy because this is recommended.   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

## 2020-04-15 NOTE — Assessment & Plan Note (Signed)
Without anemia, no prior colonoscopy. Recommended this again. She asked about home cologuard screening but with current intermittent blood in stool there is a higher chance of positive screening and would be better suited to colonoscopy.

## 2020-04-19 ENCOUNTER — Other Ambulatory Visit: Payer: Medicare Other

## 2020-04-19 DIAGNOSIS — J42 Unspecified chronic bronchitis: Secondary | ICD-10-CM | POA: Diagnosis not present

## 2020-04-20 ENCOUNTER — Telehealth: Payer: Self-pay | Admitting: Internal Medicine

## 2020-04-20 NOTE — Telephone Encounter (Signed)
Returned call, rep advised that despite patient using med for multiple indications, they only want 1 on the PAP application. Corrected application and fax back to program. Updates will be documented in previous encounter.

## 2020-04-21 ENCOUNTER — Other Ambulatory Visit: Payer: Self-pay | Admitting: Internal Medicine

## 2020-04-21 LAB — RESPIRATORY CULTURE OR RESPIRATORY AND SPUTUM CULTURE
MICRO NUMBER:: 11760270
RESULT:: NORMAL
SPECIMEN QUALITY:: ADEQUATE

## 2020-04-21 NOTE — Telephone Encounter (Signed)
Patient requesting refill on Trelegy. Allergy/contraindication flagging.   CY please advise

## 2020-04-23 ENCOUNTER — Inpatient Hospital Stay: Admission: RE | Admit: 2020-04-23 | Payer: Medicare Other | Source: Ambulatory Visit

## 2020-04-24 NOTE — Telephone Encounter (Signed)
[facility]

## 2020-04-25 ENCOUNTER — Encounter: Payer: Self-pay | Admitting: *Deleted

## 2020-04-26 NOTE — Telephone Encounter (Signed)
Left VM with patient regarding approval and need for Epipen for first dose in the clinic. Will f/u

## 2020-04-26 NOTE — Telephone Encounter (Signed)
Received a fax from  [place] regarding an approval for [place] patient assistance from 04/25/20 to 01/07/21.   Phone number: [phone]

## 2020-04-27 MED ORDER — EPINEPHRINE 0.3 MG/0.3ML IJ SOAJ: 0.3000 mg | INTRAMUSCULAR | 2 refills | Status: AC | PRN

## 2020-04-27 NOTE — Telephone Encounter (Signed)
Spoke with patient. Rx for Epipen sent to [facility] on [address].  Patient advised that she will be monitored in the clinic post-dose and require Epipen to be hand to start it. She voiced understanding  Dupixent new start scheduled for 05/04/20 @ 2:20pm with pharmacy clinic.  Patient approved through [place] through 01/07/21

## 2020-05-03 ENCOUNTER — Other Ambulatory Visit: Payer: Self-pay | Admitting: Family Medicine

## 2020-05-04 ENCOUNTER — Telehealth: Payer: Self-pay | Admitting: Pharmacist

## 2020-05-04 ENCOUNTER — Other Ambulatory Visit: Payer: Medicare Other | Admitting: Pharmacist

## 2020-05-04 NOTE — Telephone Encounter (Signed)
R/s patient for Dupixent new start on 05/16/20 @ 1:00p 

## 2020-05-04 NOTE — Telephone Encounter (Signed)
R/s patient for Dupixent new start on 05/16/20 @ 1:00p

## 2020-05-08 NOTE — Assessment & Plan Note (Signed)
With her complex medical history, will check ANCA for suggestion of eosinophilic vasculitis

## 2020-05-08 NOTE — Assessment & Plan Note (Addendum)
Pending completion of Dupixent assistance application- discussed. CT abd indicated lung nodule Plan- Sputum cultures, CT sinus and chest, Dupixent financial assistance forms.

## 2020-05-08 NOTE — Assessment & Plan Note (Signed)
She reports hx of a great many episodes of pneumonia. Most of these were more likely asthmatic bronchitis.  Plan- observation on current meds.

## 2020-05-09 ENCOUNTER — Other Ambulatory Visit: Payer: Medicare Other

## 2020-05-13 ENCOUNTER — Other Ambulatory Visit: Payer: Self-pay | Admitting: Family Medicine

## 2020-05-16 ENCOUNTER — Other Ambulatory Visit: Payer: Self-pay | Admitting: Family Medicine

## 2020-05-16 ENCOUNTER — Other Ambulatory Visit: Payer: Self-pay

## 2020-05-16 ENCOUNTER — Other Ambulatory Visit: Payer: Self-pay | Admitting: Internal Medicine

## 2020-05-16 ENCOUNTER — Ambulatory Visit: Payer: Medicare Other | Admitting: Pharmacist

## 2020-05-16 DIAGNOSIS — Z79899 Other long term (current) drug therapy: Secondary | ICD-10-CM

## 2020-05-16 DIAGNOSIS — J4540 Moderate persistent asthma, uncomplicated: Secondary | ICD-10-CM

## 2020-05-16 DIAGNOSIS — Z7189 Other specified counseling: Secondary | ICD-10-CM

## 2020-05-16 DIAGNOSIS — J309 Allergic rhinitis, unspecified: Secondary | ICD-10-CM

## 2020-05-16 DIAGNOSIS — L2082 Flexural eczema: Secondary | ICD-10-CM

## 2020-05-16 MED ORDER — DUPIXENT 300 MG/2ML ~~LOC~~ SOAJ: 300.0000 mg | SUBCUTANEOUS | 3 refills | Status: DC

## 2020-05-16 NOTE — Progress Notes (Signed)
HPI Patient presents today to [facility] to see pharmacy team for [place] new start.  Past medical history includes moderate persistent asthma, COPD, allergic rhinitis, OSA, HTN, atopic dermatitis, history of vitamin D deficiency, substance, RLS, anxiety, history of alcohol use disorder.  Dupixent was the best treatment option since she has history of atopic dermatitis managed by Dr. Nelva Bush.  Epipen on hand and in date today  Respiratory Medications Current: Trelegy 100/62.5/25 mcg (1 puff once daily), montelukast 10mg  daily,  Patient reports no known adherence challenges  OBJECTIVE Allergies  Allergen Reactions  . Astelin [Azelastine] Other (See Comments)    Per patient caused psychiatric illness  . Nasonex [Mometasone] Other (See Comments)    Per patient caused psychiatric illness  . Other     Can not eat carbohydrates without protein, is allergic to certain foods but can take in certain doses    Outpatient Encounter Medications as of 05/16/2020  Medication Sig  . TRELEGY ELLIPTA 100-62.5-25 MCG/INH AEPB TAKE 1 PUFF BY MOUTH DAILY. RINSE MOUTH  . acetaminophen (TYLENOL) 500 MG tablet Take 500 mg by mouth every 8 (eight) hours as needed.   [name] Kitchen albuterol (PROVENTIL) (2.5 MG/3ML) 0.083% nebulizer solution Take 3 mLs (2.5 mg total) by nebulization every 6 (six) hours as needed for wheezing or shortness of breath.  [name] Kitchen albuterol (VENTOLIN HFA) 108 (90 Base) MCG/ACT inhaler Inhale 1-2 puffs into the lungs every 6 (six) hours as needed for wheezing or shortness of breath.  [name] Kitchen amLODipine (NORVASC) 5 MG tablet Take 1 tablet (5 mg total) by mouth daily.  [name] Kitchen b complex vitamins tablet Take 1 tablet by mouth 3 (three) times a week.  . clotrimazole-betamethasone (LOTRISONE) cream APPLY TO AFFECTED AREA TWICE A DAY  . DULoxetine (CYMBALTA) 20 MG capsule TAKE 1 CAPSULE BY MOUTH EVERY DAY  . EPINEPHrine 0.3 mg/0.3 mL IJ SOAJ injection Inject 0.3 mg into the muscle as needed for anaphylaxis.  .  fluocinonide cream (LIDEX) 1.91 % Apply 1 application topically 2 (two) times daily as needed.  . hydrocortisone 2.5 % ointment Apply topically 2 (two) times daily.  . hydrOXYzine (ATARAX/VISTARIL) 10 MG tablet TAKE 1 TABLET BY MOUTH EVERYDAY AT BEDTIME  . meloxicam (MOBIC) 15 MG tablet TAKE 1 TABLET BY MOUTH EVERY DAY  . metFORMIN (GLUCOPHAGE) 500 MG tablet Take 1 tablet (500 mg total) by mouth daily with breakfast.  . montelukast (SINGULAIR) 10 MG tablet TAKE 1 TABLET BY MOUTH EVERYDAY AT BEDTIME  . mupirocin ointment (BACTROBAN) 2 % SMARTSIG:1 Application Topical 2-3 Times Daily (Patient not taking: Reported on 04/15/2020)  . pimecrolimus (ELIDEL) 1 % cream APPLY TOPICALLY TWICE A DAY (Patient not taking: Reported on 04/15/2020)  . Ruxolitinib Phosphate (OPZELURA) 1.5 % CREA Apply 1 application topically 2 (two) times daily as needed. For itchy rash. (Patient not taking: Reported on 04/15/2020)  . Vitamin D, Ergocalciferol, (DRISDOL) 1.25 MG (50000 UNIT) CAPS capsule TAKE 1 CAPSULE (50,000 UNITS TOTAL) BY MOUTH EVERY 7 (SEVEN) DAYS   No facility-administered encounter medications on file as of 05/16/2020.     Immunization History  Administered Date(s) Administered  . PFIZER(Purple Top)SARS-COV-2 Vaccination 02/22/2019, 03/17/2019, 11/05/2019  . Tdap 12/15/2015  . Zoster 12/15/2015     PFTs PFT Results Latest Ref Rng & Units 05/22/2019  FVC-Pre L 1.83  FVC-Predicted Pre % 66  FVC-Post L 1.93  FVC-Predicted Post % 69  Pre FEV1/FVC % % 66  Post FEV1/FCV % % 69  FEV1-Pre L 1.20  FEV1-Predicted Pre % 57  FEV1-Post L 1.33  DLCO uncorrected ml/min/mmHg 16.40  DLCO UNC% % 91  DLVA Predicted % 111     Eosinophils Most recent blood eosinophil count was 10 cells/microL taken on 09/16/19   IgE : 326 on 09/16/19, 663 on 11/28/17   Assessment   1. Biologics training (Dupilumab (Dupixent) o MOA: a human monoclonal IgG4 antibody that inhibits interleukin-4 and interleukin-13 cytokine-induced  responses, including release of proinflammatory cytokines, chemokines, and IgE o Response to therapy: 3 months to see maximum effectiveness o Side effects: injection site reaction (6-18%), antibody development (5-16%), ophthalmic conjunctivitis (2-16%), transient blood eosinophilia (1-2%) o Dosing: 600 mg (received in clinic on 05/16/20) followed by 300 mg subQ every other week o Administration:  1. Do not shake.  2. Do not use if solution is discolored or contains particulate matter or if window on prefilled pen is yellow (indicates pen has been used).  1. Administer as a subcutaneous injection into the thigh or lower abdomen (avoiding areas within 2 inches of navel); caregiver may administer in upper arm.  2. Rotate injection sites, including initial doses (administer 600 mg initial dose as two 300 mg injections at different sites; administer 400 mg initial dose as two 200 mg injections at different sites).   We discussed goals of therapy including reduced rescue inhaler use as well as reduced steroid taper use as well as improved PFTs.  She self-administered Dupixent in right and left thigh.  Medication:  Dupixent 300mg /61mL  X 2 NDC: [address]-89 Lot:   [id number] Exp:  11/07/2021  2. Medication Reconciliation  A drug regimen assessment was performed, including review of allergies, interactions, disease-state management, dosing and immunization history. Medications were reviewed with the patient, including name, instructions, indication, goals of therapy, potential side effects, importance of adherence, and safe use.  Drug interaction(s): none identified  3. Immunizations   Patient is indicated for the influenza, pneumonia, and shingles vaccinations.  She has received [employer] vaccine on 02/22/19, 03/17/19, and booster on 11/05/19. She states she has received second COVID19 booster a couple of weeks ago  No record of pneumonia vaccine.  Zoster on 12/15/15. She is eligible for 2-dose  Shingles vaccine that is inactivated. We discussed this in detail and she states she will consider.  She reports that she is not interested in pneumonia or influenza vaccine and does not receive it because she doesn't trust how they're formulated.   We discussed the goal of vaccinations is to prevent hospitalization. She would like to think on these more but appreciated the education.  PLAN - Continue Dupixent 300mg  every 14 days. Next dose is due 05/30/20. Patient approved through [place] patient assistance. She has already received shipment of Dupixent at home and placed in the refrigerator - Continue Trelegy 100/62.5/25 mcg once daily and montelukast 10mg  daily. - No f/u with Dr. Annamaria Boots scheduled. She states she had put her name for 3 month f/u with Dr.Young at last visit  All questions encouraged and answered.  Instructed patient to reach out with any further questions or concerns.  Thank you for allowing pharmacy to participate in this patient's care.  Knox Saliva, PharmD, MPH Clinical Pharmacist (Rheumatology and Pulmonology)

## 2020-05-16 NOTE — Patient Instructions (Addendum)
Your next Dupixent dose is due on 5/23, 6/6, and every 2 weeks thereafter  Your prescription will be shipped from [place]. Their phone number is [phone].  Take just 1 pen out of the box every 2 weeks. Each box has 2 pens so be careful not to take the whole box out at once.  Remember the 5 C's:  COUNTER - leave on the counter at least 30 minutes but up to overnight to bring medication to room temperature. This may help prevent stinging  COLD - place something cold (like an ice gel pack or cold water bottle) on the injection site just before cleansing with alcohol. This may help reduce pain  CLARITIN - use Claritin (generic name is loratadine) for the first two weeks of treatment or the day of, the day before, and the day after injecting. This will help to minimize injection site reactions  CORTISONE CREAM - apply if injection site is irritated and itching  CALL ME - if injection site reaction is bigger than the size of your fist, looks infected, blisters, or if you develop hives  Anaphylactic Reactions  Anaphylactic reactions can occur up to 24 hours after administration.  What are the signs and symptoms of anaphylaxis?  Symptoms can vary from a mild skin reaction to more severe reactions including: . Wheezing, shortness of breath, cough, chest tightness or trouble breathing . Low blood pressure, dizziness, fainting, rapid of weak heartbeat, anxiety, or feeling of "impending doom" . Flushing, itching, hives, or feeling warm . Swelling of the throat or tongue, throat tightness, hoarse voice, or trouble swallowing  Some of these symptoms require immediate treatment, as they can be life threatening.  If you experience severe symptoms which are bolded above use your Epipen and call 911 for immediate emergency care.

## 2020-05-18 ENCOUNTER — Other Ambulatory Visit: Payer: Medicare Other | Admitting: Pharmacist

## 2020-05-20 NOTE — Progress Notes (Signed)
Awesome!  Thanks for the update.

## 2020-05-23 ENCOUNTER — Other Ambulatory Visit: Payer: Medicare Other

## 2020-05-24 ENCOUNTER — Ambulatory Visit
Admission: RE | Admit: 2020-05-24 | Discharge: 2020-05-24 | Disposition: A | Discharge status: Home / Self Care | Payer: Medicare Other | Source: Ambulatory Visit | Attending: Internal Medicine | Admitting: Internal Medicine

## 2020-05-24 ENCOUNTER — Other Ambulatory Visit: Payer: Medicare Other

## 2020-05-24 DIAGNOSIS — J322 Chronic ethmoidal sinusitis: Secondary | ICD-10-CM | POA: Diagnosis not present

## 2020-05-24 DIAGNOSIS — R918 Other nonspecific abnormal finding of lung field: Secondary | ICD-10-CM | POA: Diagnosis not present

## 2020-05-24 DIAGNOSIS — J321 Chronic frontal sinusitis: Secondary | ICD-10-CM | POA: Diagnosis not present

## 2020-05-24 DIAGNOSIS — J3489 Other specified disorders of nose and nasal sinuses: Secondary | ICD-10-CM | POA: Diagnosis not present

## 2020-05-24 DIAGNOSIS — R911 Solitary pulmonary nodule: Secondary | ICD-10-CM

## 2020-05-24 DIAGNOSIS — J329 Chronic sinusitis, unspecified: Secondary | ICD-10-CM

## 2020-05-24 DIAGNOSIS — J323 Chronic sphenoidal sinusitis: Secondary | ICD-10-CM | POA: Diagnosis not present

## 2020-05-25 NOTE — Progress Notes (Signed)
LMTCB

## 2020-05-27 NOTE — Progress Notes (Signed)
Tried calling the pt- no answer- LMTCB.

## 2020-05-28 ENCOUNTER — Other Ambulatory Visit: Payer: Self-pay | Admitting: *Deleted

## 2020-05-28 ENCOUNTER — Encounter: Payer: Self-pay | Admitting: *Deleted

## 2020-05-28 DIAGNOSIS — R918 Other nonspecific abnormal finding of lung field: Secondary | ICD-10-CM

## 2020-06-01 ENCOUNTER — Telehealth: Payer: Self-pay | Admitting: Internal Medicine

## 2020-06-01 NOTE — Telephone Encounter (Signed)
See below

## 2020-06-01 NOTE — Telephone Encounter (Signed)
Patient called and said that she tested positive for Covid 19 on a home test. She was wondering if she needed to get another test. She said that she is having some congestion. Virtual appointment was offered and patient denied. She can be reached at [phone]. Please advise

## 2020-06-01 NOTE — Telephone Encounter (Signed)
Patient called again and was wanting to know if she qualified for the anitviral. She can be reached at [phone]

## 2020-06-02 ENCOUNTER — Telehealth (INDEPENDENT_AMBULATORY_CARE_PROVIDER_SITE_OTHER): Payer: Medicare Other | Admitting: Internal Medicine

## 2020-06-02 ENCOUNTER — Other Ambulatory Visit (HOSPITAL_COMMUNITY): Payer: Self-pay

## 2020-06-02 DIAGNOSIS — Z9989 Dependence on other enabling machines and devices: Secondary | ICD-10-CM | POA: Diagnosis not present

## 2020-06-02 DIAGNOSIS — U071 COVID-19: Secondary | ICD-10-CM | POA: Diagnosis not present

## 2020-06-02 DIAGNOSIS — J449 Chronic obstructive pulmonary disease, unspecified: Secondary | ICD-10-CM | POA: Diagnosis not present

## 2020-06-02 DIAGNOSIS — G4733 Obstructive sleep apnea (adult) (pediatric): Secondary | ICD-10-CM

## 2020-06-02 MED ORDER — CLOTRIMAZOLE-BETAMETHASONE 1-0.05 % EX CREA: TOPICAL_CREAM | Freq: Two times a day (BID) | CUTANEOUS | 0 refills | Status: DC

## 2020-06-02 MED ORDER — MOLNUPIRAVIR EUA 200MG CAPSULE
4.0000 | ORAL_CAPSULE | Freq: Two times a day (BID) | ORAL | 0 refills | Status: AC
  Filled 2020-06-02: qty 40, 5d supply, fill #0

## 2020-06-02 MED ORDER — PREDNISONE 20 MG PO TABS
40.0000 mg | ORAL_TABLET | Freq: Every day | ORAL | 0 refills | Status: DC
Start: 2020-06-02 — End: 2020-08-11
  Filled 2020-06-02: qty 10, 5d supply, fill #0

## 2020-06-02 MED ORDER — PROMETHAZINE-DM 6.25-15 MG/5ML PO SYRP
5.0000 mL | ORAL_SOLUTION | Freq: Four times a day (QID) | ORAL | 0 refills | Status: DC | PRN
Start: 2020-06-02 — End: 2021-01-16
  Filled 2020-06-02: qty 118, 6d supply, fill #0

## 2020-06-02 NOTE — Telephone Encounter (Signed)
Patient has been scheduled for a virtual visit with Dr. Sharlet Salina at 11:00 am 06/02/2020.

## 2020-06-02 NOTE — Progress Notes (Signed)
Virtual Visit via Video Note  I connected with Toni Hicks on 06/02/20 at 11:00 AM EDT by a video enabled telemedicine application and verified that I am speaking with the correct person using two identifiers.  The patient and the provider were at separate locations throughout the entire encounter. Patient location: home, Provider location: work   I discussed the limitations of evaluation and management by telemedicine and the availability of in person appointments. The patient expressed understanding and agreed to proceed. The patient and the provider were the only parties present for the visit unless noted in HPI below.  History of Present Illness: The patient is a 70 y.o. female with visit for covid-19. Started Tuesday feeling bad. Covid-19 test positive at home. Has fevers and chills and cough and sore throat. Denies SOB but oxygen levels have been lower at 92% recently (they are never 100%). Overall it is not improving. Has had 4 shots of covid-19 vaccine.  Observations/Objective: Appearance: normal, some coughing during visit, breathing appears normal speaking in full sentences, casual grooming, abdomen does not appear distended, mental status is A and O times 3  Assessment and Plan: See problem oriented charting  Follow Up Instructions: rx molnupiravir and promethazine/dm cough syrup and prednisone, refill cream  I discussed the assessment and treatment plan with the patient. The patient was provided an opportunity to ask questions and all were answered. The patient agreed with the plan and demonstrated an understanding of the instructions.   The patient was advised to call back or seek an in-person evaluation if the symptoms worsen or if the condition fails to improve as anticipated.  Hoyt Koch, MD

## 2020-06-02 NOTE — Telephone Encounter (Signed)
This should have gone to provider in office as this is time sensitive. I do not prescribe antivirals for covid-19 outside of a visit due to the complexity in reviewing if this is needed and the interactions with medications. She can do visit if desired.

## 2020-06-03 ENCOUNTER — Encounter: Payer: Self-pay | Admitting: Internal Medicine

## 2020-06-03 DIAGNOSIS — U071 COVID-19: Secondary | ICD-10-CM | POA: Insufficient documentation

## 2020-06-03 NOTE — Assessment & Plan Note (Signed)
Suspect she is getting flare due to covid-19 and with dropping oxygen levels below baseline will treat covid-19 with antiviral and prednisone course. Rx promethazine/dm for cough. Continue albuterol and dupixent and trelegy.

## 2020-06-03 NOTE — Assessment & Plan Note (Signed)
She is having difficulty using due to covid-19 currently so feeling more drowsy during the day. Advised to resume when able.

## 2020-06-03 NOTE — Assessment & Plan Note (Signed)
She is high risk for complications due to age, lung disease, medications. Rx molnupiravir and promethazine/dm and prednisone. She is asked to follow up closely with [place] on clinical status. If worsening breathing or O2 levels staying <90 call or seek care in ER. We talked about risk and benefit of antiviral therapy and she qualifies.

## 2020-06-08 ENCOUNTER — Telehealth: Payer: Self-pay | Admitting: Internal Medicine

## 2020-06-08 NOTE — Telephone Encounter (Signed)
See below

## 2020-06-08 NOTE — Telephone Encounter (Signed)
  Seeking advice  Patient calling to report she has not felt much better since 5/26, she has worsening cough and congestion. Patient requesting "cocktail" be prescribed. She does not have a fever. Covid+

## 2020-06-09 ENCOUNTER — Other Ambulatory Visit: Payer: Self-pay | Admitting: Family Medicine

## 2020-06-09 ENCOUNTER — Other Ambulatory Visit: Payer: Self-pay | Admitting: Internal Medicine

## 2020-06-09 NOTE — Telephone Encounter (Signed)
Unable to get in contact with the patient. LVM asking the patient to return my call here at the office. Office number was provided.

## 2020-06-09 NOTE — Telephone Encounter (Signed)
° ° °  Please return call to patient °

## 2020-06-09 NOTE — Telephone Encounter (Signed)
I am not sure what cocktail means. Did she take the molnupiravir and prednisone? Has the cough medication helped at all? How is breathing doing? Cough can last for 3-4 weeks with this.

## 2020-06-09 NOTE — Telephone Encounter (Signed)
Spoke with the patient and she stated that she did take the molnupiravir and prednisone. The cough medicine has helped but she has episodes of coughing up phlegm to where she can't breathe and it scares her. She mentioned that her breathing is ok even though she has copd. She plans to tested through a drive thru testing site tomorrow. Please advise

## 2020-06-10 NOTE — Telephone Encounter (Signed)
There is not a need for retesting for covid-19. If needed we can refill the cough medicine. Likely the cough will take 3-4 more weeks, call back if any change in breathing.

## 2020-06-14 NOTE — Telephone Encounter (Signed)
Spoke with the patient and she stated that she feels much better. No refill needed at this time.

## 2020-06-15 ENCOUNTER — Telehealth: Payer: Self-pay | Admitting: Internal Medicine

## 2020-06-15 NOTE — Telephone Encounter (Signed)
Spoke with the pt  She states she was dx with covid approx 2 wks after her first [place] inj  She is wondering if she needs to start all over now or how she should proceed with this  Last inj was 05/16/20  Routing to Dr Annamaria Boots and well as the pharmacy team  Thank you!

## 2020-06-15 NOTE — Telephone Encounter (Signed)
Patient does not need to re-load Dupixent. She can continue taking Dupixent 300 mg every 2 weeks. Called patient to advise and she states she will take the dose today.  She will plan to set up reminders with [place]. States she has some short-term memory issues after her stroke, therefore the reminders will help her to remember. She verbalized understanding. Nothing further needed.  Knox Saliva, PharmD, MPH Clinical Pharmacist (Rheumatology and Pulmonology)

## 2020-06-29 ENCOUNTER — Telehealth: Payer: Self-pay | Admitting: Pharmacist

## 2020-06-29 DIAGNOSIS — J4540 Moderate persistent asthma, uncomplicated: Secondary | ICD-10-CM

## 2020-06-29 MED ORDER — DUPIXENT 300 MG/2ML ~~LOC~~ SOAJ: 300.0000 mg | SUBCUTANEOUS | 0 refills | Status: DC

## 2020-06-29 NOTE — Telephone Encounter (Signed)
Patient started Dupixent on 05/16/20. She does not have f/u scheduled yet with Dr. Annamaria Boots. Routing to scheduling team to reach out to patient to have patient seen in [place] so Dr. Annamaria Boots can assess clinical changes since starting Dupixent.  Refill for Dupixent x 84 day supply sent to [facility] for 1 fill only  Knox Saliva, PharmD, MPH Clinical Pharmacist (Rheumatology and Pulmonology)

## 2020-07-05 NOTE — Telephone Encounter (Signed)
Called and left message on pt vm to call back to schedule f/u with Dr. Annamaria Boots in August or September-pr

## 2020-07-09 ENCOUNTER — Other Ambulatory Visit: Payer: Self-pay | Admitting: Allergy

## 2020-07-11 ENCOUNTER — Other Ambulatory Visit: Payer: Self-pay | Admitting: Family Medicine

## 2020-07-14 ENCOUNTER — Telehealth: Payer: Self-pay | Admitting: Internal Medicine

## 2020-07-14 NOTE — Telephone Encounter (Signed)
Pt wanted to notify Dr. Annamaria Boots and Pharmacy that she missed her administration date for Dupixent and after aprox 4 days of missing does and still without taking dose broke out with itchy hives that lasted 2 days. After the hives resolved she injected missed does and has had no c/o since that time. Routing to pharmacy and Dr. Annamaria Boots as Juluis Rainier. Pt states no hives or itching at this time

## 2020-07-15 NOTE — Telephone Encounter (Signed)
Noted. Will close encounter. She has f/u with Dr.Young on 08/12/20

## 2020-07-31 ENCOUNTER — Other Ambulatory Visit: Payer: Self-pay | Admitting: Family Medicine

## 2020-08-03 ENCOUNTER — Other Ambulatory Visit: Payer: Self-pay | Admitting: Allergy

## 2020-08-09 ENCOUNTER — Other Ambulatory Visit: Payer: Self-pay | Admitting: Family Medicine

## 2020-08-09 ENCOUNTER — Other Ambulatory Visit: Payer: Self-pay

## 2020-08-09 ENCOUNTER — Other Ambulatory Visit: Payer: Self-pay | Admitting: Allergy

## 2020-08-09 ENCOUNTER — Other Ambulatory Visit: Payer: Self-pay | Admitting: Internal Medicine

## 2020-08-09 MED ORDER — HYDROXYZINE HCL 10 MG PO TABS: ORAL_TABLET | ORAL | 0 refills | Status: DC

## 2020-08-09 MED ORDER — DULOXETINE HCL 20 MG PO CPEP: 20.0000 mg | ORAL_CAPSULE | Freq: Every day | ORAL | 0 refills | Status: DC

## 2020-08-09 MED ORDER — MELOXICAM 15 MG PO TABS: 15.0000 mg | ORAL_TABLET | Freq: Every day | ORAL | 0 refills | Status: DC

## 2020-08-10 ENCOUNTER — Other Ambulatory Visit: Payer: Self-pay | Admitting: Allergy

## 2020-08-11 NOTE — Progress Notes (Signed)
HPI F former smoker followed for OSA, complicated by Restless Legs, ETOH abuse, ETOH induced insomnia, Allergic rhinitis/ conjunctivitis, Nasal polyps, anxiety, depression, HBP, CVA, Hypothyroid, Asthma, Urticaria, DM NPSG Dohmeier 04/08/2017-  AHI 11.25/ hr, desaturation to 53%, 174 lbs Office Spirometry 03/20/2018 (Allergy and Asthma) Moderate obstruction, with FVC 1.84/ 70%, FEV1 1.09/ 54%, ratio 0.59/ 77%, FEF25-75% 0.44/ 24%. 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, Moxifloxacin Lab- IgE H 326 09/16/19 ---------------------------------------------------------------------------------------------------  04/14/20- 70 yoF former smoker followed for COPD, hx Recurrent Pneumonias, Atypical AFB,  complicated by OSA ( Dr Dohmeier/ [facility]), Restless Legs, ETOH abuse, ETOH induced insomnia, Allergic rhinitis/ conjunctivitis(Dr Padgett), Nasal polyps, anxiety, depression, HBP, CVA, Nasal Septal Perforation,  Hypothyroid,  Urticaria, DM2, CPAP 9/ [facility]- followed by [facility]/ Dr Maureen Chatters A!AT 12/09/19- 179, wnl 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 Usimg rescue hfa 2-3x/ day, continuing Trelegy and Singulair.  Does report morning cough with much postnasal drip. Less SHOB, cough and wheeze. Declines pneumonia vaccine.4  08/12/20- 70 yoF former smoker followed for COPD, Lung Nodules, hx Recurrent Pneumonias, Atypical AFB,  complicated by OSA ( Dr Dohmeier/ [facility]), Restless Legs, ETOH abuse, ETOH induced insomnia, Allergic rhinitis/ conjunctivitis(Dr Padgett), Nasal polyps, anxiety, depression, HBP, CVA, Nasal Septal Perforation,   Hypothyroid,  Urticaria, DM2, Covid vax- 3 Phizer, 1 Moderna,    Had covid infection 2022,  (CPAP 9/ [facility]- followed by [facility]/ Dr Maureen Chatters) -Ventolin hfa, Singulair, Trelegy 100, Neb albuterol, Dupixent,  ----COPD, OSA, skin around nose with bumps Broke out rash after missing Dupixent.  Breathing has been much better with Dupixent and she has been very pleased.  Also noting less postnasal drainage in the mornings.  Trelegy is giving good additional control. CT chest 05/25/20- IMPRESSION: 1. Multiple small pulmonary nodules, measuring 5 mm and smaller. Nodules included in the lung bases on prior examination are unchanged. Multiple additional nodules not previously imaged. No follow-up needed if patient is low-risk (and has no known or suspected primary neoplasm). Non-contrast chest CT can be considered in 12 months if patient is high-risk. This recommendation follows the consensus statement: Guidelines for Management of Incidental Pulmonary Nodules Detected on CT Images: From the Fleischner Society 2017; Radiology 2017; 284:228-243. 2. Minimal emphysema. 3. Background of very fine centrilobular pulmonary nodules concentrated in the lung apices, consistent with smoking-related respiratory bronchiolitis. Aortic Atherosclerosis (ICD10-I70.0) and Emphysema (ICD10-J43.9).  ROS-see HPI   + = positive Constitutional:    weight loss, night sweats, fevers, chills, fatigue, lassitude. HEENT:    headaches, difficulty swallowing, tooth/dental problems, sore throat,       sneezing, itching, ear ache, +nasal congestion, post nasal drip, snoring CV:    chest pain, orthopnea, PND, swelling in lower extremities, anasarca,                                   dizziness, +palpitations Resp:   +shortness of breath with exertion or at rest.                +productive cough,   non-productive cough, coughing up of blood.  change in color of mucus.  wheezing.   Skin:    +rash or lesions. GI:     heartburn, +indigestion, abdominal pain, nausea, vomiting, diarrhea,                 change in bowel habits, loss of appetite GU: dysuria, change in color of urine, no urgency or frequency.   flank pain. MS:   joint pain, +stiffness, decreased range of motion, back pain. Neuro-     nothing unusual Psych:  change in mood or affect.  depression or +anxiety.   memory loss.  OBJ- Physical Exam General- Alert, Oriented, Affect-appropriate, Distress- none acute, +obese Skin- + eczema antecubital, calves Lymphadenopathy- none Head- atraumatic            Eyes- Gross vision intact, PERRLA, conjunctivae and secretions clear            Ears- Hearing, canals-normal            Nose- Clear, no-Septal dev, mucus, polyps, erosion, perforation             Throat- Mallampati II , mucosa clear , drainage- none, tonsils- atrophic Neck- flexible , trachea midline, no stridor , thyroid nl, carotid no bruit Chest - symmetrical excursion , unlabored           Heart/CV- RRR , no murmur , no gallop  , no rub, nl s1 s2                           - JVD- none , edema- none, stasis changes- none, varices- none           Lung- diminished, wheeze-none, cough-none, dullness-none, rub- none           Chest wall-  Abd-  Br/ Gen/ Rectal- Not done, not indicated Extrem- cyanosis- none, clubbing, none, atrophy- none, strength- nl Neuro- grossly intact to observation

## 2020-08-12 ENCOUNTER — Other Ambulatory Visit: Payer: Self-pay

## 2020-08-12 ENCOUNTER — Ambulatory Visit: Payer: Medicare Other | Admitting: Internal Medicine

## 2020-08-12 ENCOUNTER — Encounter: Payer: Self-pay | Admitting: Internal Medicine

## 2020-08-12 DIAGNOSIS — J339 Nasal polyp, unspecified: Secondary | ICD-10-CM

## 2020-08-12 DIAGNOSIS — J449 Chronic obstructive pulmonary disease, unspecified: Secondary | ICD-10-CM | POA: Diagnosis not present

## 2020-08-12 NOTE — Patient Instructions (Signed)
Glad you are dong so well !!  Good for you.  We can continue current meds  Please call if we can help

## 2020-08-16 ENCOUNTER — Telehealth: Payer: Medicare Other | Admitting: Adult Health

## 2020-09-05 ENCOUNTER — Other Ambulatory Visit: Payer: Self-pay | Admitting: Family Medicine

## 2020-09-16 IMAGING — CT CT PARANASAL SINUSES LIMITED
1 series · 8 of 10 positions shown, 10 images · non-contrast
Comparison: None.

EXAM:
CT PARANASAL SINUS LIMITED WITHOUT CONTRAST
TECHNIQUE: Multidetector CT images of the paranasal sinuses were obtained using
the standard protocol without intravenous contrast.

[Series 3: limited sinus st · axial · 0.27mm/px · z∈[-94,-24]mm · 8 of 10 slices shown, 10 images]
[im 2/10  brain]
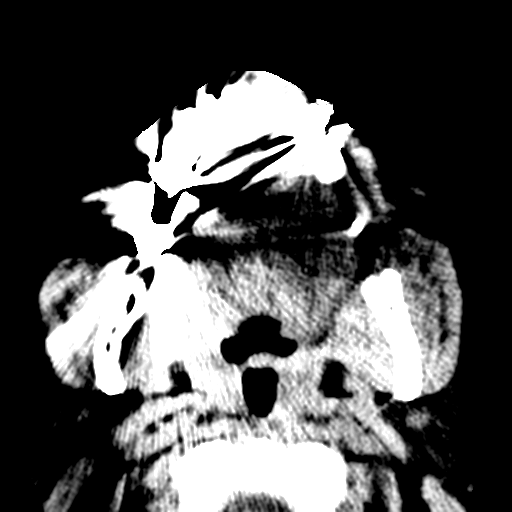
[im 2/10  bone]
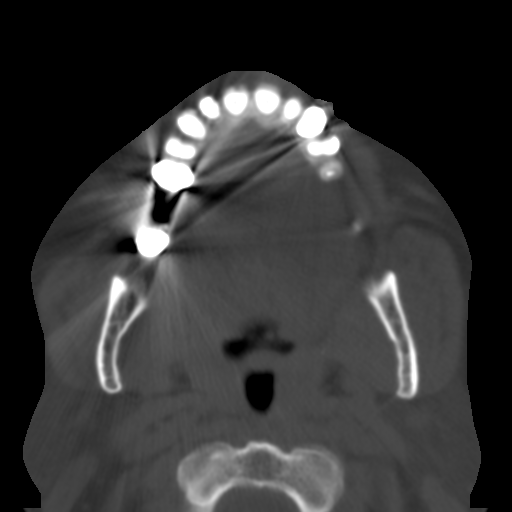
[im 3/10  bone]
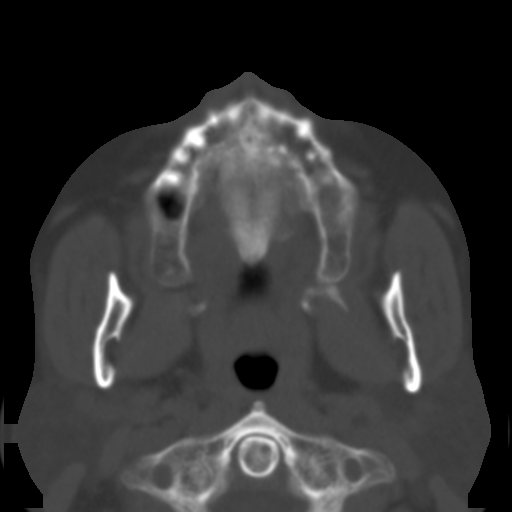
[im 4/10  bone]
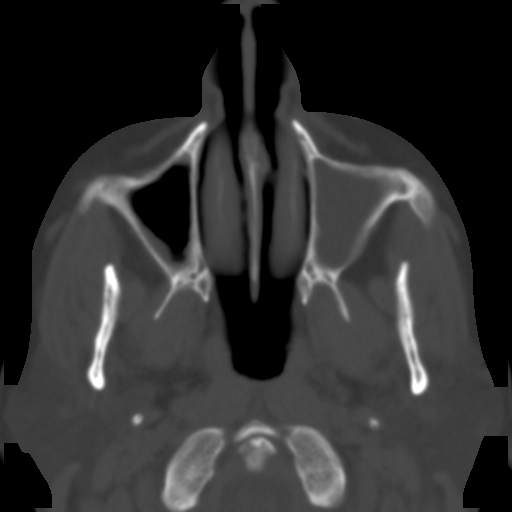
[im 5/10  bone]
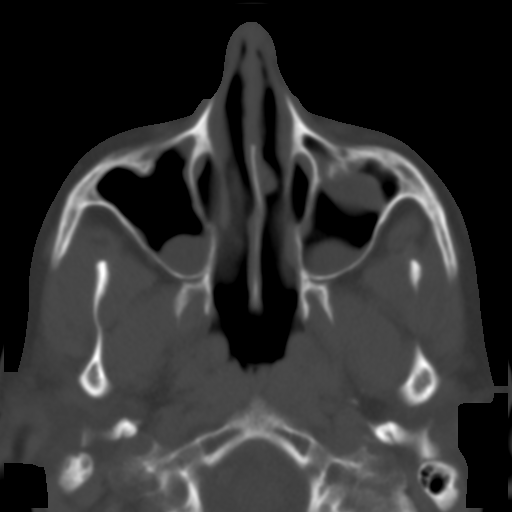
[im 6/10  brain]
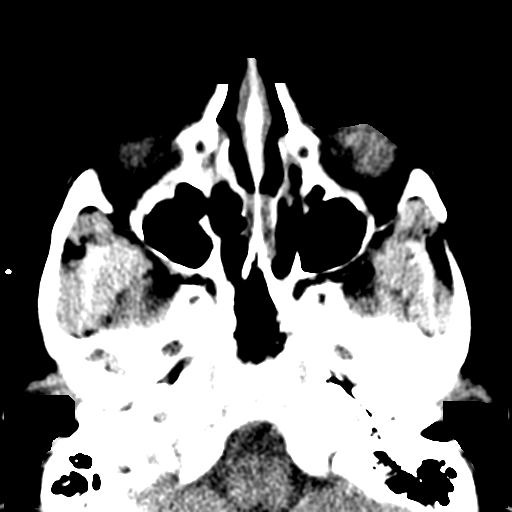
[im 6/10  bone]
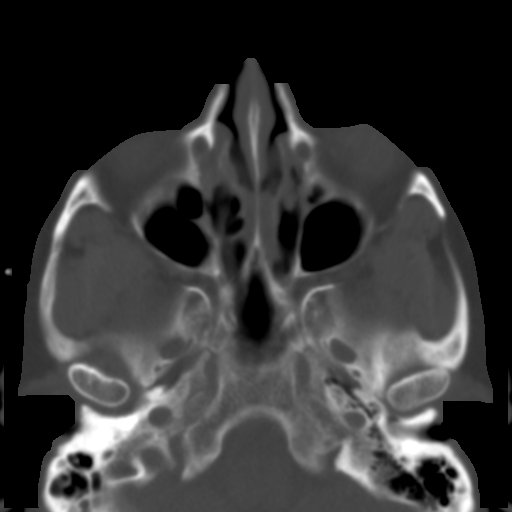
[im 7/10  bone]
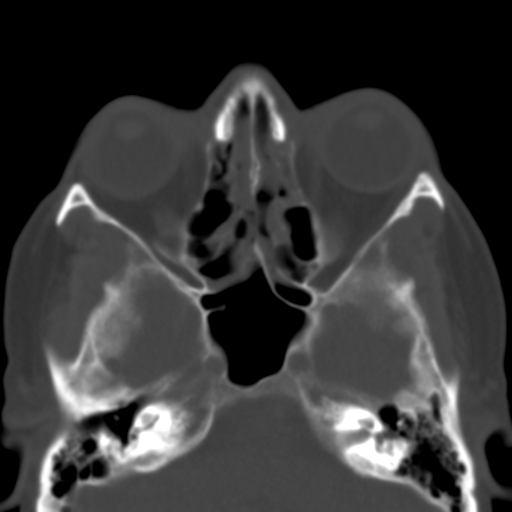
[im 8/10  bone]
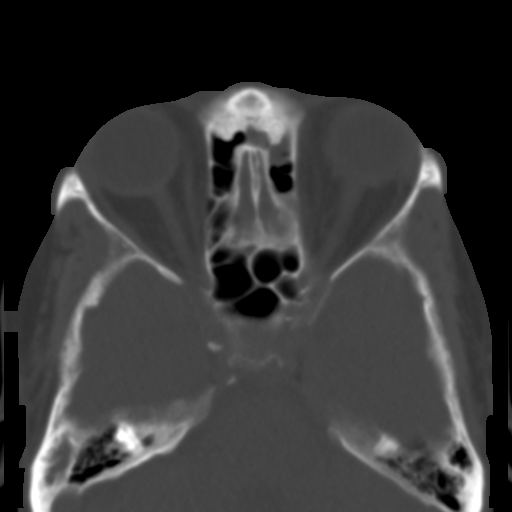
[im 9/10  bone]
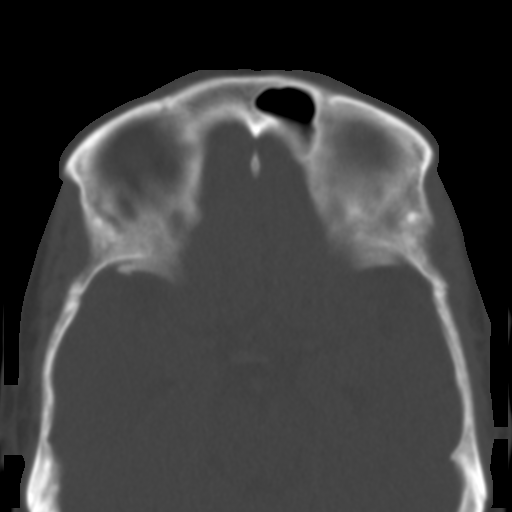

[8 of 10 positions shown; findings below may reference images not displayed]

FINDINGS: Limited axial images of the paranasal sinuses are provided for
interpretation.

Visualized left frontal sinus well aerated. Right frontal sinus not
visualized, and may be hypoplastic. Moderate scattered mucosal
thickening throughout the ethmoidal air cells bilaterally, left
slightly worse than right. Mucosal thickening at the sphenoid
ethmoidal foramina bilaterally, left greater than right. Sphenoid
sinuses otherwise patent. Dominant right sphenoid sinus with
hypoplastic left sphenoid. Scattered mucosal thickening with
polypoid opacity within the partially visualized maxillary sinuses,
left greater than right. No air-fluid levels to suggest acute
sinusitis.

Trace right-to-left nasal septal deviation. Nasal passages grossly
clear.

Visualized mastoids and middle ear cavities are clear.

Visualized globes and orbital soft tissues within normal limits.
Visualized soft tissues of the face unremarkable. Partially
visualized intracranial contents within normal limits.
IMPRESSION: Moderate mucosal thickening throughout the ethmoidal and maxillary
sinuses bilaterally, left slightly greater than right. No air-fluid
levels to suggest acute sinusitis identified.

## 2020-09-26 ENCOUNTER — Telehealth: Payer: Self-pay | Admitting: Internal Medicine

## 2020-09-26 MED ORDER — DOXYCYCLINE HYCLATE 100 MG PO TABS: 100.0000 mg | ORAL_TABLET | Freq: Two times a day (BID) | ORAL | 0 refills | Status: DC

## 2020-09-26 MED ORDER — PREDNISONE 10 MG PO TABS: ORAL_TABLET | ORAL | 0 refills | Status: DC

## 2020-09-26 NOTE — Telephone Encounter (Signed)
Spoke with the pt and notified of response per Dr Annamaria Boots. She verbalized understanding and rxs were sent to pharm.

## 2020-09-26 NOTE — Telephone Encounter (Signed)
Doxycycline 100 mg, # 14, 1 twice daily  Prednisone 10 mg, # 20   4 X 2 DAYS, 3 X 2 DAYS, 2 X 2 DAYS, 1 X 2 DAYS

## 2020-09-26 NOTE — Telephone Encounter (Signed)
Spoke with the pt  She c/o cough x 2 wks, slight increased SOB and wheezing  She is using her trelegy, singulair, albuterol  She is concerned bc every day her sputum is getting darker brown  She denies any f/c/s, aches  Has had 2 negative covid tests at home  Please advise thanks!  Allergies  Allergen Reactions   Astelin [Azelastine] Other (See Comments)    Per patient caused psychiatric illness   Nasonex [Mometasone] Other (See Comments)    Per patient caused psychiatric illness   Other     Can not eat carbohydrates without protein, is allergic to certain foods but can take in certain doses

## 2020-09-27 ENCOUNTER — Telehealth: Payer: Medicare Other | Admitting: Adult Health

## 2020-10-04 ENCOUNTER — Other Ambulatory Visit: Payer: Self-pay | Admitting: Family Medicine

## 2020-10-04 ENCOUNTER — Other Ambulatory Visit: Payer: Self-pay | Admitting: Allergy

## 2020-10-04 ENCOUNTER — Other Ambulatory Visit: Payer: Self-pay | Admitting: Internal Medicine

## 2020-10-05 ENCOUNTER — Telehealth: Payer: Self-pay | Admitting: Internal Medicine

## 2020-10-05 MED ORDER — ALBUTEROL SULFATE HFA 108 (90 BASE) MCG/ACT IN AERS: INHALATION_SPRAY | RESPIRATORY_TRACT | 3 refills | Status: DC

## 2020-10-05 NOTE — Telephone Encounter (Signed)
ATC patient about refill looks like she has been getting it from [facility] and just wanted to clarify, LMTCB

## 2020-10-05 NOTE — Telephone Encounter (Signed)
I called and spoke with the pt  She is no longer seeing allergy since her dupixent working so well  I refilled her albuterol  Nothing further needed

## 2020-10-06 ENCOUNTER — Other Ambulatory Visit: Payer: Self-pay | Admitting: Pharmacist

## 2020-10-06 DIAGNOSIS — J4540 Moderate persistent asthma, uncomplicated: Secondary | ICD-10-CM

## 2020-10-06 MED ORDER — DUPIXENT 300 MG/2ML ~~LOC~~ SOAJ: 300.0000 mg | SUBCUTANEOUS | 0 refills | Status: DC

## 2020-10-06 NOTE — Telephone Encounter (Signed)
Received fax from [facility] for [place] refill. Signed by Dr. Annamaria Boots but escribed today to keep rx documented in patient's chart. Fax that was signed by Dr. Annamaria Boots has been shredded  Knox Saliva, PharmD, MPH, BCPS Clinical Pharmacist (Rheumatology and Pulmonology)

## 2020-10-28 ENCOUNTER — Other Ambulatory Visit: Payer: Self-pay | Admitting: Family Medicine

## 2020-11-09 ENCOUNTER — Other Ambulatory Visit: Payer: Self-pay | Admitting: *Deleted

## 2020-11-10 ENCOUNTER — Other Ambulatory Visit: Payer: Self-pay | Admitting: *Deleted

## 2020-11-30 ENCOUNTER — Other Ambulatory Visit: Payer: Self-pay | Admitting: Family Medicine

## 2020-12-02 ENCOUNTER — Other Ambulatory Visit: Payer: Self-pay | Admitting: Internal Medicine

## 2020-12-07 ENCOUNTER — Other Ambulatory Visit: Payer: Self-pay | Admitting: Family Medicine

## 2020-12-09 NOTE — Telephone Encounter (Signed)
Left message for patient to call back. Needs to schedule a visit. Dr. Tamala Julian does not want her to take Meloxicam if she is going to have dental surgery as they may give her something. If needed prior to surgery can fill for 14 days.

## 2020-12-09 NOTE — Telephone Encounter (Signed)
Surgery is scheduled for Dec 6th. Would appreciate the 14 day supply.  She is scheduled to see Dr Sharlet Salina in January.  Patient said that her knee is feeling a lot better her back has been giving her a lot of trouble.  She has been doing exeresis and stretching to try and help as well.   (Ph[facility]: [facility] [place])

## 2020-12-09 NOTE — Telephone Encounter (Signed)
Patient called in regards to the denied medication.  She said that she is going to contact Dr Sharlet Salina to see if she would be able to take it over.  She mentioned that she is going to be having dental surgery and will be going out of town before the holidays so she will be limited on time. She asked if Dr Tamala Julian would be willing to fill one more month for her while she works on getting everything settled?  Please advise.

## 2020-12-12 NOTE — Telephone Encounter (Signed)
Left message for patient to confirm surgery date.

## 2020-12-13 IMAGING — DX DG LUMBAR SPINE 2-3V
3 series · 3 of 3 positions shown · non-contrast
Comparison: Left hip films same date dictated separately.

CLINICAL DATA: 67-year-old female chronic lower back pain. No
injury. Initial encounter.

EXAM:
LUMBAR SPINE - 2-3 VIEW

[l-spine ap]
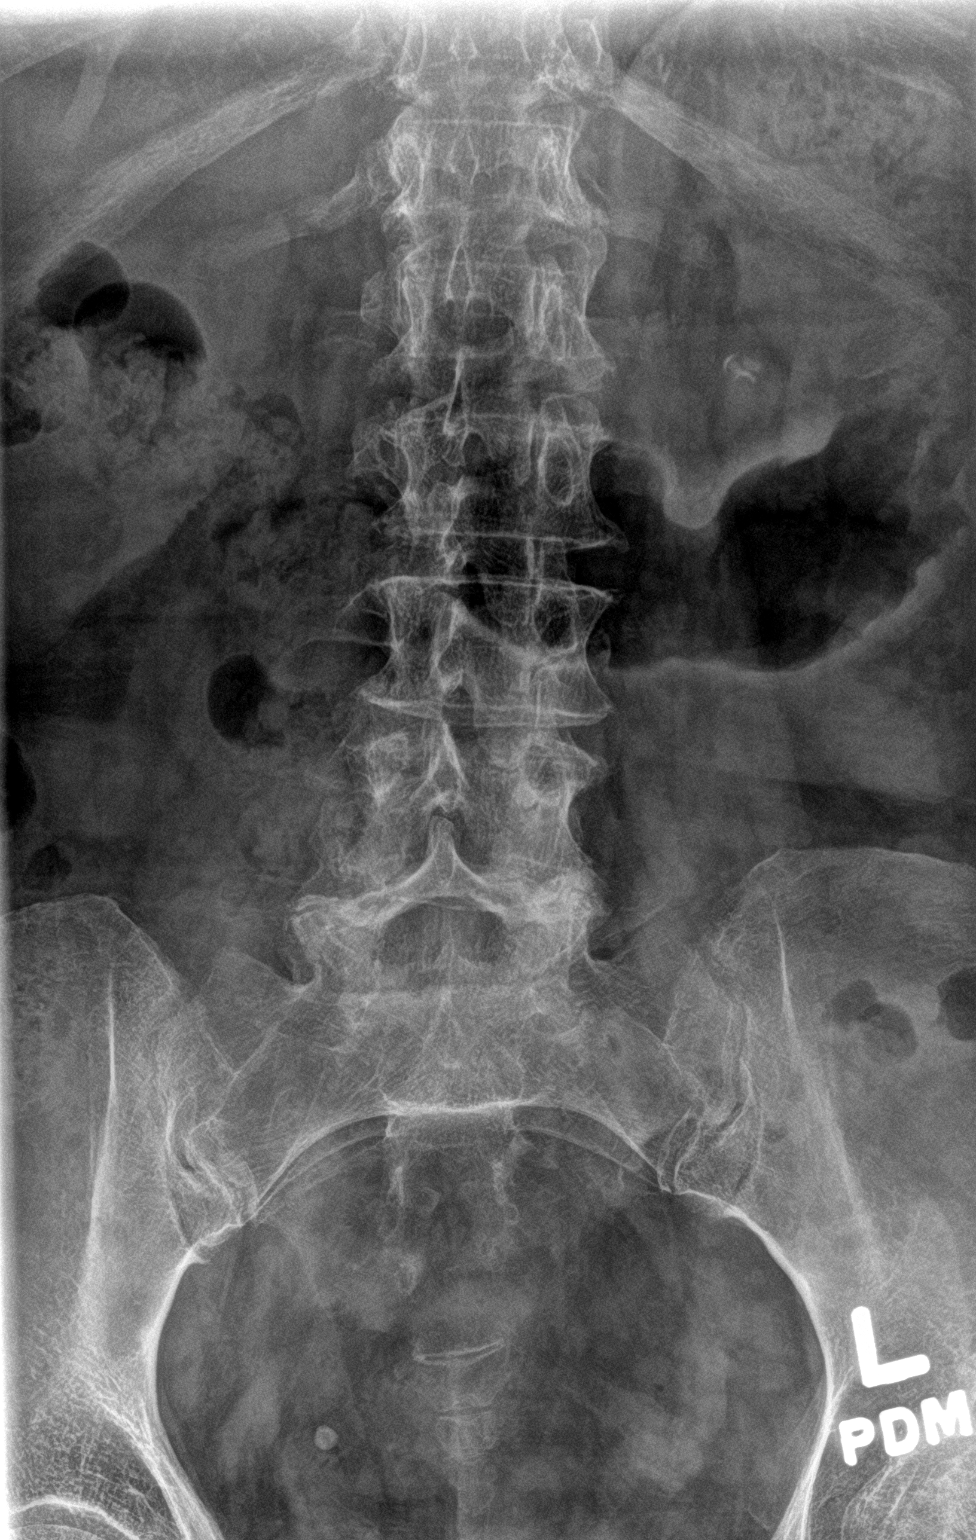

[l-spine lat]
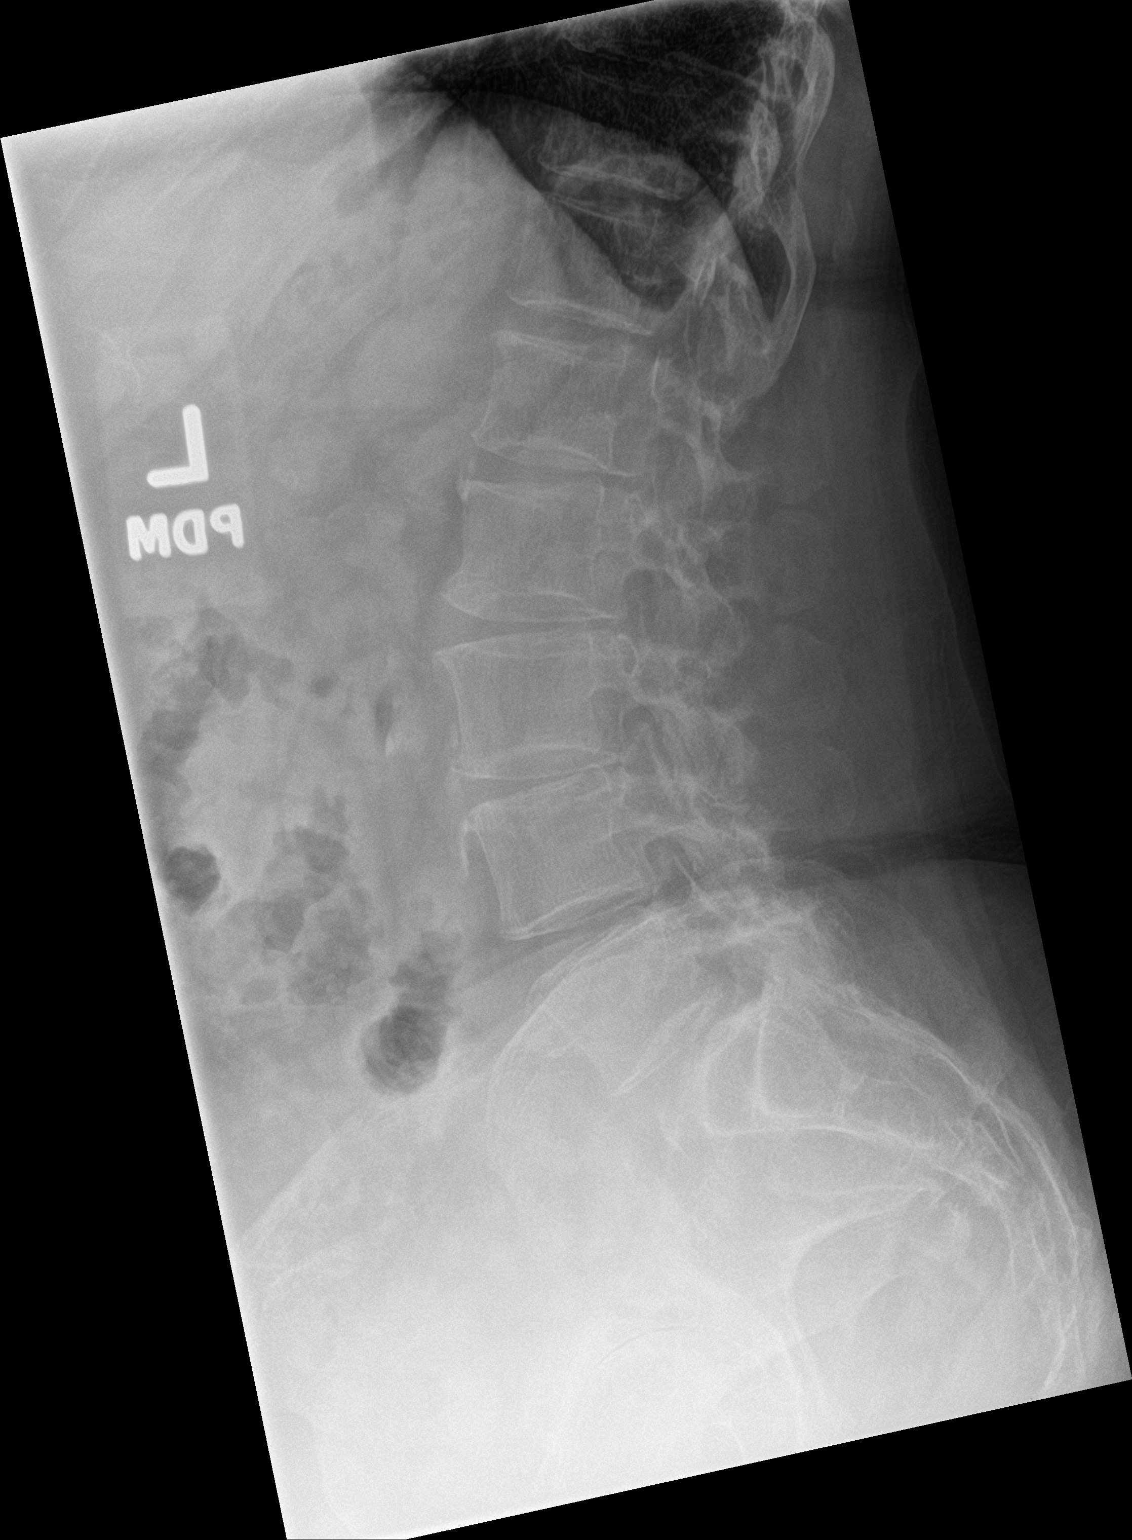

[l-spine spot]
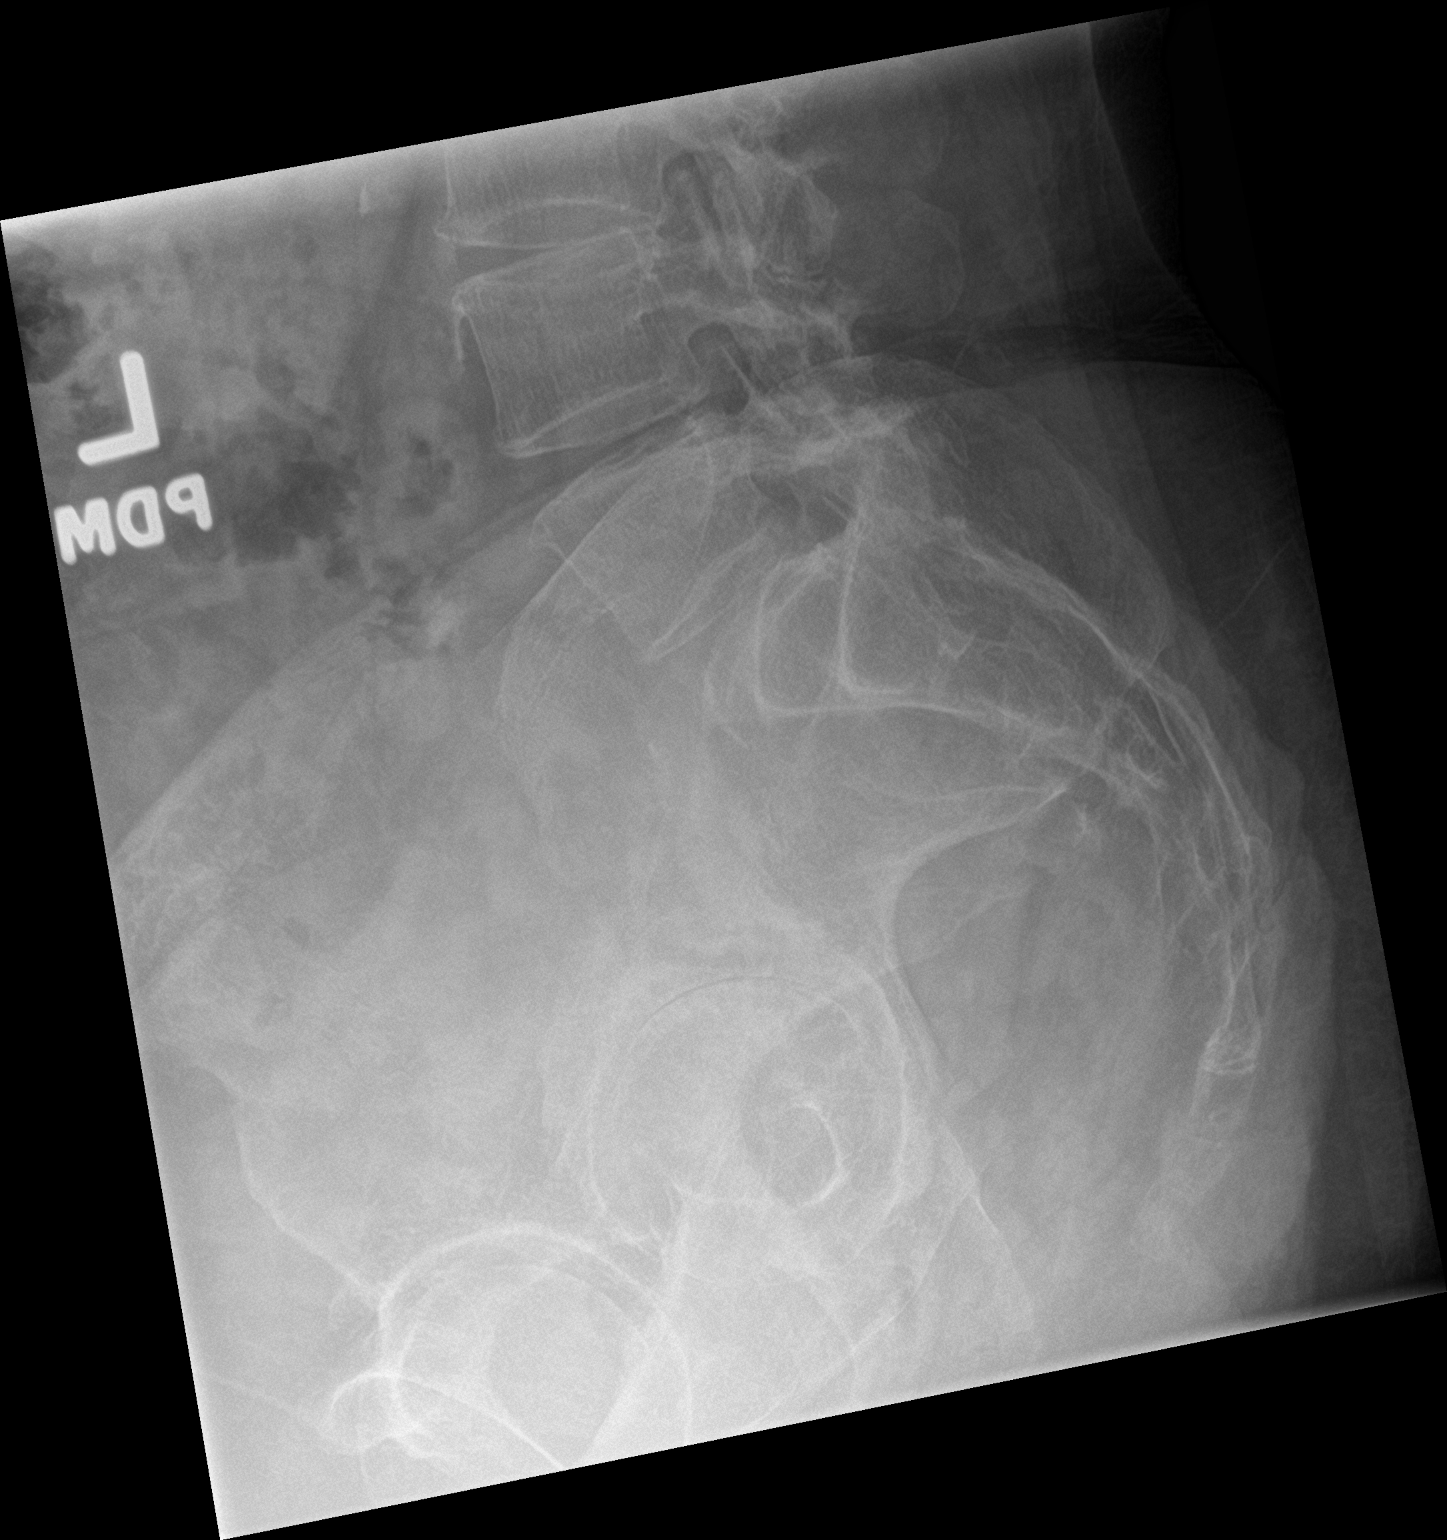

[3 of 3 positions shown; findings below may reference images not displayed]

FINDINGS: Minimal curvature lumbar spine. No compression fracture. Mild disc
space narrowing L1-2 through L5-S1. Mild L5-S1 facet degenerative
changes with minimal anterior slip L5. Vascular calcifications.
IMPRESSION: 1. Minimal curvature lumbar spine. No lumbar compression fracture
noted.
2. Mild disc space narrowing L1-2 through L5-S1.
3. L5-S1 facet degenerative changes with minimal anterior slip L5.
4.  Aortic Atherosclerosis ([id number]).

## 2020-12-27 ENCOUNTER — Other Ambulatory Visit: Payer: Self-pay

## 2020-12-27 MED ORDER — MELOXICAM 15 MG PO TABS: 15.0000 mg | ORAL_TABLET | Freq: Every day | ORAL | 0 refills | Status: DC

## 2020-12-27 NOTE — Telephone Encounter (Signed)
Patient notified via [place]. Rx filled.

## 2020-12-27 NOTE — Telephone Encounter (Signed)
Patient has had surgery and she is in recovery/much better. She asked if Dr Tamala Julian could refill the meloxicam until she is able to see Dr Sharlet Salina. She also scheduled an appointment with Tamala Julian on January 11th (first available).

## 2021-01-09 ENCOUNTER — Encounter: Payer: Self-pay | Admitting: Internal Medicine

## 2021-01-09 NOTE — Assessment & Plan Note (Signed)
Chronic bronchitis with history of recurrent pneumonias.  So far she seems to be much better controlled on Dupixent Plan-continue Dupixent

## 2021-01-09 NOTE — Assessment & Plan Note (Signed)
Now on Dupixent.  Nasal polyps are not visible anteriorly.

## 2021-01-10 ENCOUNTER — Other Ambulatory Visit: Payer: Self-pay | Admitting: Family Medicine

## 2021-01-11 ENCOUNTER — Telehealth: Payer: Self-pay | Admitting: Pharmacist

## 2021-01-11 NOTE — Telephone Encounter (Signed)
Left VM for patient to determine if she faxed in [place] PAP renewal form for Part D patients. Requested return call  Knox Saliva, PharmD, MPH, BCPS Clinical Pharmacist (Rheumatology and Pulmonology)

## 2021-01-16 ENCOUNTER — Encounter: Payer: Self-pay | Admitting: Internal Medicine

## 2021-01-16 ENCOUNTER — Ambulatory Visit (INDEPENDENT_AMBULATORY_CARE_PROVIDER_SITE_OTHER): Payer: Medicare HMO | Admitting: Internal Medicine

## 2021-01-16 ENCOUNTER — Other Ambulatory Visit: Payer: Self-pay

## 2021-01-16 VITALS — BP 124/82 | HR 82 | Resp 18 | Ht 61.0 in | Wt 198.8 lb

## 2021-01-16 DIAGNOSIS — I10 Essential (primary) hypertension: Secondary | ICD-10-CM | POA: Diagnosis not present

## 2021-01-16 DIAGNOSIS — Z0001 Encounter for general adult medical examination with abnormal findings: Secondary | ICD-10-CM

## 2021-01-16 DIAGNOSIS — J4540 Moderate persistent asthma, uncomplicated: Secondary | ICD-10-CM

## 2021-01-16 DIAGNOSIS — Z0000 Encounter for general adult medical examination without abnormal findings: Secondary | ICD-10-CM | POA: Diagnosis not present

## 2021-01-16 DIAGNOSIS — E118 Type 2 diabetes mellitus with unspecified complications: Secondary | ICD-10-CM

## 2021-01-16 DIAGNOSIS — F419 Anxiety disorder, unspecified: Secondary | ICD-10-CM | POA: Diagnosis not present

## 2021-01-16 DIAGNOSIS — Z8673 Personal history of transient ischemic attack (TIA), and cerebral infarction without residual deficits: Secondary | ICD-10-CM

## 2021-01-16 LAB — HEMOGLOBIN A1C: Hgb A1c MFr Bld: 6.5 % (ref 4.6–6.5)

## 2021-01-16 LAB — CBC
HCT: 46.7 % — ABNORMAL HIGH (ref 36.0–46.0)
Hemoglobin: 15.6 g/dL — ABNORMAL HIGH (ref 12.0–15.0)
MCHC: 33.4 g/dL (ref 30.0–36.0)
MCV: 92.6 fl (ref 78.0–100.0)
Platelets: 232 K/uL (ref 150.0–400.0)
RBC: 5.04 Mil/uL (ref 3.87–5.11)
RDW: 13.6 % (ref 11.5–15.5)
WBC: 6.1 K/uL (ref 4.0–10.5)

## 2021-01-16 LAB — COMPREHENSIVE METABOLIC PANEL WITH GFR
ALT: 103 U/L — ABNORMAL HIGH (ref 0–35)
AST: 90 U/L — ABNORMAL HIGH (ref 0–37)
Albumin: 4.3 g/dL (ref 3.5–5.2)
Alkaline Phosphatase: 101 U/L (ref 39–117)
BUN: 9 mg/dL (ref 6–23)
CO2: 31 meq/L (ref 19–32)
Calcium: 9.4 mg/dL (ref 8.4–10.5)
Chloride: 100 meq/L (ref 96–112)
Creatinine, Ser: 0.71 mg/dL (ref 0.40–1.20)
GFR: 86.53 mL/min (ref >60.00)
Glucose, Bld: 199 mg/dL — ABNORMAL HIGH (ref 70–99)
Potassium: 3.8 meq/L (ref 3.5–5.1)
Sodium: 137 meq/L (ref 135–145)
Total Bilirubin: 0.6 mg/dL (ref 0.2–1.2)
Total Protein: 7.5 g/dL (ref 6.0–8.3)

## 2021-01-16 LAB — MICROALBUMIN / CREATININE URINE RATIO
Creatinine,U: 126.6 mg/dL
Microalb Creat Ratio: 0.6 mg/g (ref 0.0–30.0)
Microalb, Ur: <0.7 mg/dL (ref 0.0–1.9)

## 2021-01-16 LAB — LIPID PANEL
Cholesterol: 156 mg/dL (ref 0–200)
HDL: 55.2 mg/dL (ref >39.00)
LDL Cholesterol: 83 mg/dL (ref 0–99)
NonHDL: 101.2
Total CHOL/HDL Ratio: 3
Triglycerides: 91 mg/dL (ref 0.0–149.0)
VLDL: 18.2 mg/dL (ref 0.0–40.0)

## 2021-01-16 MED ORDER — RYBELSUS 7 MG PO TABS: 7.0000 mg | ORAL_TABLET | Freq: Every day | ORAL | 5 refills | Status: DC

## 2021-01-16 NOTE — Patient Instructions (Addendum)
We have sent in the rybelsus to take 1 pill daily. After 1 month we typically go up on the dose if this is working. Let us know how this is doing.

## 2021-01-16 NOTE — Progress Notes (Signed)
Subjective:   Patient ID: Toni Hicks, female    DOB: 11-28-1950, 71 y.o.   MRN: [id number]  HPI Here for medicare wellness and physical, no new complaints. Please see A/P for status and treatment of chronic medical problems. Not taking metformin in some time.  Diet: DM since diabetic Physical activity: sedentary Depression/mood screen: negative Hearing: intact to whispered voice Visual acuity: grossly normal, performs annual eye exam  ADLs: capable Fall risk: none Home safety: good Cognitive evaluation: intact to orientation, naming, recall and repetition EOL planning: adv directives discussed  [place] Visit from 02/24/2020 in [place] at [facility] Total Score 0        Fall Risk 02/11/2018 07/29/2018 07/29/2018 07/30/2018 02/24/2020  Falls in the past year? 1 - - - 1  Was there an injury with Fall? 0 - - - 0  Fall Risk Category Calculator 2 - - - 1  Fall Risk Category Moderate - - - Low  Patient Fall Risk Level - High fall risk High fall risk High fall risk -  Patient at Risk for Falls Due to - - - - -  Fall risk Follow up - - - - -    I have personally reviewed and have noted 1. The patient's medical and social history - reviewed today no changes 2. Their use of alcohol, tobacco or illicit drugs 3. Their current medications and supplements 4. The patient's functional ability including ADL's, fall risks, home safety risks and hearing or visual impairment. 5. Diet and physical activities 6. Evidence for depression or mood disorders 7. Care team reviewed and updated 8.  The patient is not on an opioid pain medication.  Patient Care Team: Hoyt Koch, MD as PCP - General (Internal Medicine) Minus Breeding, MD as PCP - Cardiology (Cardiology) Past Medical History:  Diagnosis Date   ADD (attention deficit disorder)    Adult acne    Alcohol abuse    Allergic rhinitis    Anxiety and depression    Asthma    Congenital pneumonia     Eczema    Hypoglycemia    no longer per pt    Hypothyroidism    Impaired fasting blood sugar    Mycoplasma pneumonia    Restless leg syndrome    Seasonal allergies    Stroke [facility])    Urticaria    Past Surgical History:  Procedure Laterality Date   DILATION AND CURETTAGE OF UTERUS     @ 71 years old   TOTAL HIP ARTHROPLASTY Left 07/29/2018   Procedure: Left Anterior Hip Arthroplasty;  Surgeon: Melrose Nakayama, MD;  Location: [facility];  Service: Orthopedics;  Laterality: Left;   WISDOM TOOTH EXTRACTION     Family History  Problem Relation Age of Onset   COPD Father    Heart failure Father    Coronary artery disease Father    Cancer Mother        breast   Hypertension Mother    Dementia Mother    Stroke Mother    Breast cancer Mother    Review of Systems  Constitutional: Negative.   HENT: Negative.    Eyes: Negative.   Respiratory:  Negative for cough, chest tightness and shortness of breath.   Cardiovascular:  Negative for chest pain, palpitations and leg swelling.  Gastrointestinal:  Negative for abdominal distention, abdominal pain, constipation, diarrhea, nausea and vomiting.  Musculoskeletal: Negative.   Skin: Negative.   Neurological: Negative.   Psychiatric/Behavioral:  Negative.     Objective:  Physical Exam Constitutional:      Appearance: She is well-developed. She is obese.  HENT:     Head: Normocephalic and atraumatic.  Cardiovascular:     Rate and Rhythm: Normal rate and regular rhythm.  Pulmonary:     Effort: Pulmonary effort is normal. No respiratory distress.     Breath sounds: Normal breath sounds. No wheezing or rales.  Abdominal:     General: Bowel sounds are normal. There is no distension.     Palpations: Abdomen is soft.     Tenderness: There is no abdominal tenderness. There is no rebound.  Musculoskeletal:     Cervical back: Normal range of motion.  Skin:    General: Skin is warm and dry.     Comments: Foot exam done  Neurological:      Mental Status: She is alert and oriented to person, place, and time.     Coordination: Coordination normal.    Vitals:   01/16/21 1058  BP: 124/82  Pulse: 82  Resp: 18  SpO2: 98%  Weight: 198 lb 12.8 oz (90.2 kg)  Height: 5\' 1"  (1.549 m)    This visit occurred during the SARS-CoV-2 public health emergency.  Safety protocols were in place, including screening questions prior to the visit, additional usage of staff PPE, and extensive cleaning of exam room while observing appropriate contact time as indicated for disinfecting solutions.   Assessment & Plan:

## 2021-01-17 DIAGNOSIS — D225 Melanocytic nevi of trunk: Secondary | ICD-10-CM | POA: Diagnosis not present

## 2021-01-17 DIAGNOSIS — B078 Other viral warts: Secondary | ICD-10-CM | POA: Diagnosis not present

## 2021-01-17 DIAGNOSIS — Z1283 Encounter for screening for malignant neoplasm of skin: Secondary | ICD-10-CM | POA: Diagnosis not present

## 2021-01-17 NOTE — Progress Notes (Signed)
[place] Phone: [phone] Subjective:   [name], am serving as a scribe for Dr. Hulan Saas. This visit occurred during the SARS-CoV-2 public health emergency.  Safety protocols were in place, including screening questions prior to the visit, additional usage of staff PPE, and extensive cleaning of exam room while observing appropriate contact time as indicated for disinfecting solutions.   I'm seeing this patient by the request  of:  Hoyt Koch, MD  CC: Low back pain follow-up  [id number]  11/11/2019 Known significant degenerative disc disease.  Patient has arthritic changes of multiple other joints.  Patient has had some mild increase in stress recently.  Discontinue Lexapro and start on Cymbalta to help with some more in the pain control.  Discussed continuing the exercises and continuing the weight loss.  Patient does have the meloxicam but would like patient to do it in 5-day burst and continue to use it.  Patient is in agreement with the plan.  See me again 4 to 8 weeks  Update 01/18/2021 ADONIS RYTHER is a 71 y.o. female coming in with complaint of DDD lumbar and R knee pain. Patient states that her pain is intermittent. Patient is using meloxicam which helps her pain. Has tried to come off of this medication but her pain increases.   Stairs and deep knee bending increase her pain. Lifting her grandchild and getting clothes out of washing machine increases her back pain. Has to take breaks due to pain.  Patient states that she cannot walk further than 200 feet without having to take a break.  Patient states on a daily basis has had pain that does make her stop activity.  Sometimes can wake her up at night.  Meloxicam does help decrease the frequency but unfortunately continues to have them.  Reviewed patient's chart and most recent laboratory work-up 2 days ago did show significant elevation  in liver enzymes. Has been over a year since we have seen patient.      Past Medical History:  Diagnosis Date   ADD (attention deficit disorder)    Adult acne    Alcohol abuse    Allergic rhinitis    Anxiety and depression    Asthma    Congenital pneumonia    Eczema    Hypoglycemia    no longer per pt    Hypothyroidism    Impaired fasting blood sugar    Mycoplasma pneumonia    Restless leg syndrome    Seasonal allergies    Stroke ([place])    Urticaria    Past Surgical History:  Procedure Laterality Date   DILATION AND CURETTAGE OF UTERUS     @ 71 years old   TOTAL HIP ARTHROPLASTY Left 07/29/2018   Procedure: Left Anterior Hip Arthroplasty;  Surgeon: Melrose Nakayama, MD;  Location: [facility];  Service: Orthopedics;  Laterality: Left;   WISDOM TOOTH EXTRACTION     Social History   Socioeconomic History   Marital status: Widowed    Spouse name: has boyfriend Sam   Number of children: 2   Years of education: 17   Highest education level: Some college, no degree  Occupational History   Occupation: Clinical biochemist    Comment: Semi-retired  Tobacco Use   Smoking status: Former    Packs/day: 2.00    Years: 15.00    Pack years: 30.00    Types: Cigarettes    Quit date: 1980  Years since quitting: 43.0   Smokeless tobacco: Never  Vaping Use   Vaping Use: Former  Substance and Sexual Activity   Alcohol use: Yes    Alcohol/week: 0.0 standard drinks    Comment: 3-5 glasses of wine day    Drug use: No   Sexual activity: Not on file  Other Topics Concern   Not on file  Social History Narrative   HS[employer], [employer] MA- photography   Married '73- 10 years divorced, married '86- 68 years divorced, Married '96- 33 years- widowed.   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   Social Determinants of Radio broadcast assistant Strain: Not on Art therapist Insecurity: Not on file  Transportation  Needs: Not on file  Physical Activity: Not on file  Stress: Not on file  Social Connections: Not on file   Allergies  Allergen Reactions   Astelin [Azelastine] Other (See Comments)    Per patient caused psychiatric illness   Nasonex [Mometasone] Other (See Comments)    Per patient caused psychiatric illness   Other     Can not eat carbohydrates without protein, is allergic to certain foods but can take in certain doses   Family History  Problem Relation Age of Onset   COPD Father    Heart failure Father    Coronary artery disease Father    Cancer Mother        breast   Hypertension Mother    Dementia Mother    Stroke Mother    Breast cancer Mother     Current Outpatient Medications (Endocrine & Metabolic):    Semaglutide (RYBELSUS) 7 MG TABS, Take 7 mg by mouth daily.  Current Outpatient Medications (Cardiovascular):    amLODipine (NORVASC) 5 MG tablet, Take 1 tablet (5 mg total) by mouth daily. Annual appt due in February w/ labs must see provider for future refills   EPINEPHrine 0.3 mg/0.3 mL IJ SOAJ injection, Inject 0.3 mg into the muscle as needed for anaphylaxis.  Current Outpatient Medications (Respiratory):    albuterol (PROVENTIL) (2.5 MG/3ML) 0.083% nebulizer solution, Take 3 mLs (2.5 mg total) by nebulization every 6 (six) hours as needed for wheezing or shortness of breath.   albuterol (VENTOLIN HFA) 108 (90 Base) MCG/ACT inhaler, INHALE 1-2 PUFFS BY MOUTH EVERY 6 HOURS AS NEEDED FOR WHEEZE OR SHORTNESS OF BREATH   montelukast (SINGULAIR) 10 MG tablet, TAKE 1 TABLET BY MOUTH EVERYDAY AT BEDTIME   TRELEGY ELLIPTA 100-62.5-25 MCG/INH AEPB, TAKE 1 PUFF BY MOUTH DAILY. RINSE MOUTH  Current Outpatient Medications (Analgesics):    acetaminophen (TYLENOL) 500 MG tablet, Take 500 mg by mouth every 8 (eight) hours as needed.    meloxicam (MOBIC) 7.5 MG tablet, Take 1 tablet (7.5 mg total) by mouth daily.   Current Outpatient Medications (Other):    b complex vitamins  tablet, Take 1 tablet by mouth 3 (three) times a week.   clotrimazole-betamethasone (LOTRISONE) cream, APPLY TOPICALLY TWICE A DAY   doxycycline (VIBRA-TABS) 100 MG tablet, Take 1 tablet (100 mg total) by mouth 2 (two) times daily.   DULoxetine (CYMBALTA) 20 MG capsule, Take 1 capsule (20 mg total) by mouth daily.   Dupilumab (DUPIXENT) 300 MG/2ML SOPN, Inject 300 mg into the skin every 14 (fourteen) days.   fluocinonide cream (LIDEX) 3.29 %, Apply 1 application topically 2 (two) times daily as needed.   hydrocortisone 2.5 % ointment, Apply topically 2 (two)  times daily.   hydrOXYzine (ATARAX/VISTARIL) 10 MG tablet, TAKE 1 TABLET BY MOUTH EVERYDAY AT BEDTIME   Vitamin D, Ergocalciferol, (DRISDOL) 1.25 MG (50000 UNIT) CAPS capsule, TAKE 1 CAPSULE (50,000 UNITS TOTAL) BY MOUTH EVERY 7 (SEVEN) DAYS   Reviewed prior external information including notes and imaging from  primary care provider As well as notes that were available from care everywhere and other healthcare systems.  Past medical history, social, surgical and family history all reviewed in electronic medical record.  No pertanent information unless stated regarding to the chief complaint.   Review of Systems:  No headache, visual changes, nausea, vomiting, diarrhea, constipation, dizziness, abdominal pain, skin rash, fevers, chills, night sweats, weight loss, swollen lymph nodes,joint swelling, chest pain, shortness of breath, mood changes. POSITIVE muscle aches, body aches  Objective  Blood pressure 120/78, pulse 78, height 5\' 1"  (1.549 m), weight 197 lb (89.4 kg), SpO2 95 %.   General: No apparent distress alert and oriented x3 mood and affect normal, dressed appropriately.  HEENT: Pupils equal, extraocular movements intact  Respiratory: Patient's speak in full sentences and does not appear short of breath  Cardiovascular: No lower extremity edema, non tender, no erythema  Gait normal with good balance and coordination.  MSK:  Patient does have loss of lordosis of the lumbar spine.  Significant tightness noted of the low back.  Patient anything greater than 5 degrees of extension of back causes severe discomfort and pain. Patient may have some mild peripheral neuropathy noted on exam today as well.   Impression and Recommendations:    The above documentation has been reviewed and is accurate and complete Lyndal Pulley, DO

## 2021-01-18 ENCOUNTER — Encounter: Payer: Self-pay | Admitting: Family Medicine

## 2021-01-18 ENCOUNTER — Ambulatory Visit (INDEPENDENT_AMBULATORY_CARE_PROVIDER_SITE_OTHER): Payer: Medicare HMO

## 2021-01-18 ENCOUNTER — Other Ambulatory Visit: Payer: Self-pay

## 2021-01-18 ENCOUNTER — Ambulatory Visit (INDEPENDENT_AMBULATORY_CARE_PROVIDER_SITE_OTHER): Payer: Medicare HMO | Admitting: Family Medicine

## 2021-01-18 VITALS — BP 120/78 | HR 78 | Ht 61.0 in | Wt 197.0 lb

## 2021-01-18 DIAGNOSIS — F1010 Alcohol abuse, uncomplicated: Secondary | ICD-10-CM | POA: Diagnosis not present

## 2021-01-18 DIAGNOSIS — M545 Low back pain, unspecified: Secondary | ICD-10-CM

## 2021-01-18 DIAGNOSIS — M5136 Other intervertebral disc degeneration, lumbar region: Secondary | ICD-10-CM | POA: Diagnosis not present

## 2021-01-18 MED ORDER — MELOXICAM 7.5 MG PO TABS: 7.5000 mg | ORAL_TABLET | Freq: Every day | ORAL | 0 refills | Status: DC

## 2021-01-18 NOTE — Assessment & Plan Note (Signed)
It has been quite sometime since we have seen patient.  Did discuss with patient about different medications.  Patient likely did have labs recently and shows that patient's kidney functions are doing well even with her using oral anti-inflammatories regularly.  Patient did have elevation in the liver enzymes which is new though.  We will try a meloxicam at 7.5.  Encourage patient to follow-up with primary care provider to discuss the liver enzymes.  May need further work-up.  Regarding tobacco patient's pain is worse and tightness noted.  Patient is describing signs and symptoms consistent mostly with the lumbar spinal stenosis that I think could be contributing.  With patient's other health problems that a possible epidural would be beneficial.  Do feel that an MRI is necessary with this affecting daily activities as well as patient's sleep.  We will discuss with patient after findings and discuss next treatment management.

## 2021-01-18 NOTE — Patient Instructions (Signed)
Xray back MRI back Meloxicam 7.5mg  Give it 2-4 weeks to work Be active where you can Will consider epidural Talk to primary about liver enzymes

## 2021-01-18 NOTE — Assessment & Plan Note (Signed)
History of alcohol abuse.  Patient still states that she is continuing to drink.  The most likely cause of patient's elevated liver enzymes.  We will send message to primary care to further evaluate.

## 2021-01-20 ENCOUNTER — Telehealth: Payer: Self-pay

## 2021-01-20 DIAGNOSIS — J4540 Moderate persistent asthma, uncomplicated: Secondary | ICD-10-CM

## 2021-01-20 DIAGNOSIS — E118 Type 2 diabetes mellitus with unspecified complications: Secondary | ICD-10-CM | POA: Insufficient documentation

## 2021-01-20 NOTE — Assessment & Plan Note (Signed)
Taking cymbalta and this is helping well. Continue current dosing.

## 2021-01-20 NOTE — Assessment & Plan Note (Signed)
Flu shot declines. Covid-19 up to date. Pneumonia declines. Shingrix declines. Tetanus declines. Colonoscopy declines. Mammogram declines, pap smear aged out and dexa declines. Counseled about sun safety and mole surveillance. Counseled about the dangers of distracted driving. Given 10 year screening recommendations.

## 2021-01-20 NOTE — Assessment & Plan Note (Signed)
BP at goal on amlodipine 5 mg daily. Checking CMP and adjust as needed.  

## 2021-01-20 NOTE — Assessment & Plan Note (Signed)
No new stroke symptoms. She is not on statin for some reason previously she was on this. Checking lipid panel and HgA1c and adjust as needed. BP at goal.

## 2021-01-20 NOTE — Assessment & Plan Note (Signed)
Foot exam done, advised about yearly eye exam. Previously was on metformin but stopped this on her own since last visit. Rx rybelsus as she wishes to work on weight loss. Complicated by hyperlipidemia. Not on ACE-I or statin, checking microalbumin to creatinine ratio.

## 2021-01-20 NOTE — Assessment & Plan Note (Signed)
Much improved on dupixent and still using trelegy.

## 2021-01-20 NOTE — Telephone Encounter (Signed)
Received a faxed [place] renewal form from [facility]. Completed and refaxed form, will await response.  Phone# [phone] Fax# [phone]

## 2021-01-23 MED ORDER — DUPIXENT 300 MG/2ML ~~LOC~~ SOAJ: 300.0000 mg | SUBCUTANEOUS | 0 refills | Status: DC

## 2021-01-23 NOTE — Telephone Encounter (Signed)
Received notification from [facility] regarding a prior authorization for [place]. Authorization has been APPROVED from 1/13/231 to 01/07/22.   Patient must continue to fill through [place]: [phone] (receives through patient assistance)  Patient needs appt with Dr. Annamaria Boots in February and not scheduled. Routing to scheduling team for assistance  Knox Saliva, PharmD, MPH, BCPS Clinical Pharmacist (Rheumatology and Pulmonology)

## 2021-01-23 NOTE — Telephone Encounter (Addendum)
Rx for Dupixent sent to [facility] today. Unable to determine if PAP renewal approved through 2023. [place] office closed today  Knox Saliva, PharmD, MPH, BCPS Clinical Pharmacist (Rheumatology and Pulmonology)

## 2021-01-23 NOTE — Telephone Encounter (Signed)
Patient is scheduled 03/06/2021 at 11am with Dr. Annamaria Boots

## 2021-01-24 NOTE — Telephone Encounter (Signed)
Called [employer] for renewal application for Dupixent PAP.  They are requesting PA approval letter through new insurance. Faxed today.  Phone: [phone] Fax: [phone]  Knox Saliva, PharmD, MPH, BCPS Clinical Pharmacist (Rheumatology and Pulmonology)

## 2021-01-26 NOTE — Telephone Encounter (Signed)
Called patient but she did not answer. Left message for her to call back.  

## 2021-01-30 ENCOUNTER — Telehealth: Payer: Self-pay | Admitting: Internal Medicine

## 2021-01-30 DIAGNOSIS — J4540 Moderate persistent asthma, uncomplicated: Secondary | ICD-10-CM

## 2021-01-30 MED ORDER — DUPIXENT 300 MG/2ML ~~LOC~~ SOAJ: 300.0000 mg | SUBCUTANEOUS | 0 refills | Status: DC

## 2021-01-30 NOTE — Telephone Encounter (Signed)
Returned call to patient. Advised that rx was sent on Monday, 01/23/21. She requests three month supply. Rx sent. I advised that our revise office policy is for patients to be seen every 6-9 months if taking asthma biologic.  She states she was approved for PAP 2 months ago and has been without medication. I advised that our clinic was not notified that she was approved. Also recommended that she reach out to clinic if she is ever running out of medication because we have sample medications that we can provide until approval is confirmed. She states she was in [place] and would not have been able to stop by for samples regardless, but I advised that these recommendations are for next year's renewal consideration.  She verbalized understanding.  Knox Saliva, PharmD, MPH, BCPS Clinical Pharmacist (Rheumatology and Pulmonology)

## 2021-01-31 ENCOUNTER — Other Ambulatory Visit: Payer: Self-pay

## 2021-01-31 ENCOUNTER — Ambulatory Visit (INDEPENDENT_AMBULATORY_CARE_PROVIDER_SITE_OTHER): Payer: Medicare HMO | Admitting: Emergency Medicine

## 2021-01-31 ENCOUNTER — Encounter: Payer: Self-pay | Admitting: Emergency Medicine

## 2021-01-31 VITALS — BP 122/70 | HR 86 | Temp 98.4°F | Ht 61.0 in | Wt 192.0 lb

## 2021-01-31 DIAGNOSIS — J441 Chronic obstructive pulmonary disease with (acute) exacerbation: Secondary | ICD-10-CM | POA: Diagnosis not present

## 2021-01-31 DIAGNOSIS — R051 Acute cough: Secondary | ICD-10-CM | POA: Insufficient documentation

## 2021-01-31 DIAGNOSIS — J449 Chronic obstructive pulmonary disease, unspecified: Secondary | ICD-10-CM | POA: Diagnosis not present

## 2021-01-31 DIAGNOSIS — J22 Unspecified acute lower respiratory infection: Secondary | ICD-10-CM | POA: Diagnosis not present

## 2021-01-31 MED ORDER — PROMETHAZINE-DM 6.25-15 MG/5ML PO SYRP: 5.0000 mL | ORAL_SOLUTION | Freq: Four times a day (QID) | ORAL | 0 refills | Status: DC | PRN

## 2021-01-31 MED ORDER — AZITHROMYCIN 250 MG PO TABS: ORAL_TABLET | ORAL | 0 refills | Status: DC

## 2021-01-31 MED ORDER — PREDNISONE 20 MG PO TABS: 20.0000 mg | ORAL_TABLET | Freq: Every day | ORAL | 0 refills | Status: AC

## 2021-01-31 MED ORDER — BENZONATATE 200 MG PO CAPS: 200.0000 mg | ORAL_CAPSULE | Freq: Two times a day (BID) | ORAL | 0 refills | Status: DC | PRN

## 2021-01-31 NOTE — Assessment & Plan Note (Signed)
Suspected secondary bacterial infection.  May benefit from antibiotic.  Start azithromycin for 5 days.  Oral corticosteroids for 5 days. Continue Trelegy and Dupixent.

## 2021-01-31 NOTE — Assessment & Plan Note (Signed)
Affecting quality of life and draining her.  Also exacerbating bronchospasm.  We will start Tessalon 200 mg 3 times a day and Phenergan DM elixir for cough control.

## 2021-01-31 NOTE — Progress Notes (Signed)
Toni Hicks 71 y.o.   Chief Complaint  Patient presents with   Cough    X 1 week, chest congestion and nasal congestion    HISTORY OF PRESENT ILLNESS: Acute problem visit today. This is a 71 y.o. female with history of COPD and recurrent bouts of bronchitis/pneumonia complaining of 1 week history of chest congestion and persistent productive cough.  Bringing up yellow sputum.  Denies fever or chills.  Has dyspnea on exertion with intermittent wheezing.  Denies chest pain or palpitations.  Tested negative for COVID several times. Presently on Trelegy and Dupixent for COPD.  Working well.  Cough Associated symptoms include shortness of breath and wheezing. Pertinent negatives include no chest pain, chills, fever, headaches, rash or sore throat.    Prior to Admission medications   Medication Sig Start Date End Date Taking? Authorizing Provider  acetaminophen (TYLENOL) 500 MG tablet Take 500 mg by mouth every 8 (eight) hours as needed.    Yes [provider]  albuterol (PROVENTIL) (2.5 MG/3ML) 0.083% nebulizer solution Take 3 mLs (2.5 mg total) by nebulization every 6 (six) hours as needed for wheezing or shortness of breath. 01/05/20  Yes Young, Tarri Fuller D, MD  albuterol (VENTOLIN HFA) 108 (90 Base) MCG/ACT inhaler INHALE 1-2 PUFFS BY MOUTH EVERY 6 HOURS AS NEEDED FOR WHEEZE OR SHORTNESS OF BREATH 10/05/20  Yes Young, Clinton D, MD  amLODipine (NORVASC) 5 MG tablet Take 1 tablet (5 mg total) by mouth daily. Annual appt due in February w/ labs must see provider for future refills 12/05/20  Yes Hoyt Koch, MD  b complex vitamins tablet Take 1 tablet by mouth 3 (three) times a week.   Yes [provider]  clotrimazole-betamethasone (LOTRISONE) cream APPLY TOPICALLY TWICE A DAY 08/09/20  Yes Hoyt Koch, MD  DULoxetine (CYMBALTA) 20 MG capsule Take 1 capsule (20 mg total) by mouth daily. 08/09/20  Yes Hulan Saas M, DO  Dupilumab (DUPIXENT) 300 MG/2ML SOPN  Inject 300 mg into the skin every 14 (fourteen) days. 01/30/21  Yes Young, Clinton D, MD  EPINEPHrine 0.3 mg/0.3 mL IJ SOAJ injection Inject 0.3 mg into the muscle as needed for anaphylaxis. 04/27/20  Yes Young, Tarri Fuller D, MD  fluocinonide cream (LIDEX) 4.19 % Apply 1 application topically 2 (two) times daily as needed. 12/30/19  Yes Padgett, Rae Halsted, MD  hydrocortisone 2.5 % ointment Apply topically 2 (two) times daily. 02/12/20  Yes Kennith Gain, MD  hydrOXYzine (ATARAX/VISTARIL) 10 MG tablet TAKE 1 TABLET BY MOUTH EVERYDAY AT BEDTIME 12/05/20  Yes Hulan Saas M, DO  meloxicam (MOBIC) 7.5 MG tablet Take 1 tablet (7.5 mg total) by mouth daily. 01/18/21  Yes Hulan Saas M, DO  montelukast (SINGULAIR) 10 MG tablet TAKE 1 TABLET BY MOUTH EVERYDAY AT BEDTIME 08/09/20  Yes Padgett, Rae Halsted, MD  Semaglutide (RYBELSUS) 7 MG TABS Take 7 mg by mouth daily. 01/16/21  Yes Hoyt Koch, MD  TRELEGY ELLIPTA 100-62.5-25 MCG/INH AEPB TAKE 1 PUFF BY MOUTH DAILY. RINSE MOUTH 04/24/20  Yes Young, Clinton D, MD  Vitamin D, Ergocalciferol, (DRISDOL) 1.25 MG (50000 UNIT) CAPS capsule TAKE 1 CAPSULE (50,000 UNITS TOTAL) BY MOUTH EVERY 7 (SEVEN) DAYS 10/31/20  Yes Lyndal Pulley, DO    Allergies  Allergen Reactions   Astelin [Azelastine] Other (See Comments)    Per patient caused psychiatric illness   Nasonex [Mometasone] Other (See Comments)    Per patient caused psychiatric illness   Other  Can not eat carbohydrates without protein, is allergic to certain foods but can take in certain doses    Patient Active Problem List   Diagnosis Date Noted   Diabetes mellitus type 2 with complications ([place]) 33/29/5188   Eczema 12/09/2019   Vitamin D deficiency 09/11/2019   Degenerative arthritis of knee, bilateral 08/06/2019   Degenerative disc disease, lumbar 08/06/2019   Obesity 08/06/2019   Primary osteoarthritis of left hip 07/29/2018   COPD mixed type ([place]) 05/28/2018    History of completed stroke 02/12/2018   Moderate persistent asthma, uncomplicated 41/66/0630   Nasal polyposis 01/29/2018   Nasal septal perforation 01/29/2018   Anxiety 01/21/2018   Arthritis of left hip 01/14/2018   Obstructive sleep apnea treated with continuous positive airway pressure (CPAP) 09/18/2017   Insomnia 09/18/2017   Small vessel disease, cerebrovascular 09/17/2017   RLS (restless legs syndrome) 03/11/2017   Substance abuse ([place]) 03/11/2017   Noncompliance with medications 02/28/2017   Stress incontinence 11/13/2016   Encounter for general adult medical examination with abnormal findings 12/10/2010   Alcohol abuse 11/14/2007   Essential hypertension 11/14/2007   Allergic rhinitis due to allergen 11/14/2007    Past Medical History:  Diagnosis Date   ADD (attention deficit disorder)    Adult acne    Alcohol abuse    Allergic rhinitis    Anxiety and depression    Asthma    Congenital pneumonia    Eczema    Hypoglycemia    no longer per pt    Hypothyroidism    Impaired fasting blood sugar    Mycoplasma pneumonia    Restless leg syndrome    Seasonal allergies    Stroke ([place])    Urticaria     Past Surgical History:  Procedure Laterality Date   DILATION AND CURETTAGE OF UTERUS     @ 71 years old   TOTAL HIP ARTHROPLASTY Left 07/29/2018   Procedure: Left Anterior Hip Arthroplasty;  Surgeon: Melrose Nakayama, MD;  Location: [facility];  Service: Orthopedics;  Laterality: Left;   WISDOM TOOTH EXTRACTION      Social History   Socioeconomic History   Marital status: Widowed    Spouse name: has boyfriend Sam   Number of children: 2   Years of education: 17   Highest education level: Some college, no degree  Occupational History   Occupation: Clinical biochemist    Comment: Semi-retired  Tobacco Use   Smoking status: Former    Packs/day: 2.00    Years: 15.00    Pack years: 30.00    Types: Cigarettes    Quit date: 1980    Years since quitting: 43.0    Smokeless tobacco: Never  Vaping Use   Vaping Use: Former  Substance and Sexual Activity   Alcohol use: Yes    Alcohol/week: 0.0 standard drinks    Comment: 3-5 glasses of wine day    Drug use: No   Sexual activity: Not on file  Other Topics Concern   Not on file  Social History Narrative   HSG, [employer] MA- photography   Married '73- 10 years divorced, married '86- 15 years divorced, Married '96- 6 years- widowed.   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  Negative for chills and fever.  HENT:  Positive for congestion. Negative for sore throat.   Respiratory:  Positive for cough, sputum production, shortness of breath and wheezing.   Cardiovascular: Negative.  Negative for chest pain and palpitations.  Gastrointestinal: Negative.  Negative for abdominal pain, diarrhea, nausea and vomiting.  Genitourinary: Negative.   Skin: Negative.  Negative for rash.  Neurological: Negative.  Negative for dizziness and headaches.  All other systems reviewed and are negative.  Today's Vitals   01/31/21 1555  BP: 122/70  Pulse: 86  Temp: 98.4 F (36.9 C)  TempSrc: Oral  SpO2: 90%  Weight: 192 lb (87.1 kg)  Height: 5\' 1"  (1.549 m)   Body mass index is 36.28 kg/m.  Physical Exam Vitals reviewed.  Constitutional:      Appearance: Normal appearance.   HENT:     Head: Normocephalic.  Eyes:     Extraocular Movements: Extraocular movements intact.     Pupils: Pupils are equal, round, and reactive to light.  Cardiovascular:     Rate and Rhythm: Normal rate and regular rhythm.     Pulses: Normal pulses.     Heart sounds: Normal heart sounds.  Pulmonary:     Breath sounds: Wheezing and rhonchi present. No rales.  Musculoskeletal:     Cervical back: No tenderness.  Lymphadenopathy:     Cervical: No cervical adenopathy.  Skin:    General: Skin is warm and dry.  Neurological:     General: No focal deficit present.     Mental Status: She is alert and oriented to person, place, and time.  Psychiatric:        Mood and Affect: Mood normal.        Behavior: Behavior normal.     ASSESSMENT & PLAN: Problem List Items Addressed This Visit       Respiratory   COPD with acute exacerbation ([place]) - Primary    Suspected secondary bacterial infection.  May benefit from antibiotic.  Start azithromycin for 5 days.  Oral corticosteroids for 5 days. Continue Trelegy and Dupixent.      Relevant Medications   azithromycin (ZITHROMAX) 250 MG tablet   benzonatate (TESSALON) 200 MG capsule   promethazine-dextromethorphan (PROMETHAZINE-DM) 6.25-15 MG/5ML syrup   predniSONE (DELTASONE) 20 MG tablet     Other   Acute cough    Affecting quality of life and draining her.  Also exacerbating bronchospasm.  We will start Tessalon 200 mg 3 times a day and Phenergan DM elixir for cough control.      Relevant Medications   benzonatate (TESSALON) 200 MG capsule   promethazine-dextromethorphan (PROMETHAZINE-DM) 6.25-15 MG/5ML syrup   predniSONE (DELTASONE) 20 MG tablet   Other Visit Diagnoses     Lower respiratory infection       Relevant Medications   azithromycin (ZITHROMAX) 250 MG tablet   predniSONE (DELTASONE) 20 MG tablet      Patient Instructions  Cough, Adult A cough helps to clear your throat and lungs.  A cough may be a sign of an  illness or another medical condition. An acute cough may only last 2-3 weeks, while a chronic cough may last 8 or more weeks. Many things can cause a cough. They include: Germs (viruses or bacteria) that attack the airway. Breathing in things that bother (irritate) your lungs. Allergies. Asthma. Mucus that runs down the back of your throat (postnasal drip). Smoking. Acid backing up from the stomach into the tube that moves food from the mouth to the stomach (gastroesophageal reflux). Some medicines. Lung problems. Other medical conditions, such as heart failure or a blood clot in the lung (pulmonary embolism). Follow these instructions at home: Medicines Take over-the-counter and prescription medicines only as told by your doctor. Talk with your doctor before you take medicines that stop a cough (cough suppressants). Lifestyle  Do not smoke, and try not to be around smoke. Do not use any products that contain nicotine or tobacco, such as cigarettes, e-cigarettes, and chewing tobacco. If you need help quitting, ask your doctor. Drink enough fluid to keep your pee (urine) pale yellow. Avoid caffeine. Do not drink alcohol if your doctor tells you not to drink. General instructions  Watch for any changes in your cough. Tell your doctor about them. Always cover your mouth when you cough. Stay away from things that make you cough, such as perfume, candles, campfire smoke, or cleaning products. If the air is dry, use a cool mist vaporizer or humidifier in your home. If your cough is worse at night, try using extra pillows to raise your head up higher while you sleep. Rest as needed. Keep all follow-up visits as told by your doctor. This is important. Contact a doctor if: You have new symptoms. You cough up pus. Your cough does not get better after 2-3 weeks, or your cough gets worse. Cough medicine does not help your cough and you are not sleeping well. You have pain that gets worse or  pain that is not helped with medicine. You have a fever. You are losing weight and you do not know why. You have night sweats. Get help right away if: You cough up blood. You have trouble breathing. Your heartbeat is very fast. These symptoms may be an emergency. Do not wait to see if the symptoms will go away. Get medical help right away. Call your local emergency services (911 in the [place] Do not drive yourself to the hospital. Summary A cough helps to clear your throat and lungs. Many things can cause a cough. Take over-the-counter and prescription medicines only as told by your doctor. Always cover your mouth when you cough. Contact a doctor if you have new symptoms or you have a cough that does not get better or gets worse. This information is not intended to replace advice given to you by your health care provider. Make sure you discuss any questions you have with your health care provider. Document Revised: 02/13/2019 Document Reviewed: 01/13/2018 Elsevier Patient Education  2022 [place], MD [facility]

## 2021-01-31 NOTE — Patient Instructions (Signed)

## 2021-02-02 ENCOUNTER — Other Ambulatory Visit: Payer: Self-pay | Admitting: Family Medicine

## 2021-02-07 ENCOUNTER — Other Ambulatory Visit: Payer: Self-pay | Admitting: Internal Medicine

## 2021-02-07 DIAGNOSIS — Z1231 Encounter for screening mammogram for malignant neoplasm of breast: Secondary | ICD-10-CM

## 2021-02-10 ENCOUNTER — Other Ambulatory Visit: Payer: Self-pay | Admitting: Family Medicine

## 2021-02-16 ENCOUNTER — Ambulatory Visit
Admission: RE | Admit: 2021-02-16 | Discharge: 2021-02-16 | Disposition: A | Discharge status: Home / Self Care | Payer: Medicare HMO | Source: Ambulatory Visit | Attending: Internal Medicine | Admitting: Internal Medicine

## 2021-02-16 ENCOUNTER — Ambulatory Visit
Admission: RE | Admit: 2021-02-16 | Discharge: 2021-02-16 | Disposition: A | Discharge status: Home / Self Care | Payer: Medicare HMO | Source: Ambulatory Visit | Attending: Family Medicine | Admitting: Family Medicine

## 2021-02-16 DIAGNOSIS — M545 Low back pain, unspecified: Secondary | ICD-10-CM | POA: Diagnosis not present

## 2021-02-16 DIAGNOSIS — Z1231 Encounter for screening mammogram for malignant neoplasm of breast: Secondary | ICD-10-CM

## 2021-02-17 ENCOUNTER — Other Ambulatory Visit: Payer: Self-pay | Admitting: Family Medicine

## 2021-03-03 ENCOUNTER — Other Ambulatory Visit: Payer: Self-pay | Admitting: Internal Medicine

## 2021-03-03 NOTE — Progress Notes (Signed)
HPI F former smoker followed for OSA, complicated by Restless Legs, ETOH abuse, ETOH induced insomnia, Allergic rhinitis/ conjunctivitis, Nasal polyps, anxiety, depression, HBP, CVA, Hypothyroid, Asthma, Urticaria, DM NPSG Dohmeier 04/08/2017-  AHI 11.25/ hr, desaturation to 53%, 174 lbs Office Spirometry 03/20/2018 (Allergy and Asthma) Moderate obstruction, with FVC 1.84/ 70%, FEV1 1.09/ 54%, ratio 0.59/ 77%, FEF25-75% 0.44/ 24%. 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, Moxifloxacin Lab- IgE H 326 09/16/19 ---------------------------------------------------------------------------------------------------   08/12/20- 70 yoF former smoker followed for COPD, Lung Nodules, hx Recurrent Pneumonias, Atypical AFB,  complicated by OSA ( Dr Dohmeier/ [facility]), Restless Legs, ETOH abuse, ETOH induced insomnia, Allergic rhinitis/ conjunctivitis(Dr Padgett), Nasal polyps, anxiety, depression, HBP, CVA, Nasal Septal Perforation,  Hypothyroid,  Urticaria, DM2, Covid vax- 3 Phizer, 1 Moderna,    Had covid infection 2022,  (CPAP 9/ [facility]- followed by [facility]/ Dr Maureen Chatters) -Ventolin hfa, Singulair, Trelegy 100, Neb albuterol, Dupixent,  ----COPD, OSA, skin around nose with bumps Broke out rash after missing Dupixent.  Breathing has been much better with Dupixent and she has been very pleased.  Also noting less postnasal drainage in the mornings.  Trelegy is giving good additional control. CT chest 05/25/20- IMPRESSION: 1. Multiple small pulmonary nodules, measuring 5 mm and smaller. Nodules included in the lung bases on prior examination are unchanged. Multiple additional nodules not previously imaged. No follow-up needed if patient is low-risk (and has no known or suspected primary neoplasm). Non-contrast chest CT can be considered in 12 months if patient is high-risk. This recommendation  follows the consensus statement: Guidelines for Management of Incidental Pulmonary Nodules Detected on CT Images: From the Fleischner Society 2017; Radiology 2017; 284:228-243. 2. Minimal emphysema. 3. Background of very fine centrilobular pulmonary nodules concentrated in the lung apices, consistent with smoking-related respiratory bronchiolitis. Aortic Atherosclerosis (ICD10-I70.0) and Emphysema (ICD10-J43.9).  03/06/21- 70 yoF former smoker followed for COPD, Lung Nodules, hx Recurrent Pneumonias, Atypical AFB,  complicated by OSA ( Dr Dohmeier/ [facility]), Restless Legs, ETOH abuse, ETOH induced insomnia, Allergic rhinitis/ conjunctivitis(Dr Padgett), Nasal polyps, anxiety, depression, HBP, CVA, Nasal Septal Perforation,  Hypothyroid,  Urticaria, DM2, Covid infection 2022  Covid vax- 4 Phizer, 1 Moderna,    (CPAP 9/ [facility]- followed by [facility]/ Dr Maureen Chatters) -Ventolin hfa, Singulair, Trelegy 100, Neb albuterol, Dupixent, -----Patient states that she is out of [place].  Feels that Dupixent has made a significant improvement and she is afraid to run out.  This will be directed to the pharmacy/infusion team. Today complains of sore throat without definite fever, nodes, cough or earache.   ROS-see HPI   + = positive Constitutional:    weight loss, night sweats, fevers, chills, fatigue, lassitude. HEENT:    headaches, difficulty swallowing, tooth/dental problems, sore throat,       sneezing, itching, ear ache, +nasal congestion, post nasal drip, snoring CV:    chest pain, orthopnea, PND, swelling in lower extremities, anasarca,                                   dizziness, +palpitations Resp:   +shortness of breath with exertion or at rest.                +productive cough,   non-productive cough, coughing up of blood.  change in color of mucus.  wheezing.   Skin:    +rash or lesions. GI:    heartburn, +indigestion, abdominal pain, nausea, vomiting, diarrhea,                 change in  bowel habits, loss of appetite GU: dysuria, change in color of urine, no urgency or frequency.   flank pain. MS:   joint pain, +stiffness, decreased range of motion, back pain. Neuro-     nothing unusual Psych:  change in mood or affect.  depression or +anxiety.   memory loss.  OBJ- Physical Exam General- Alert, Oriented, Affect-appropriate, Distress- none acute, +obese Skin- + eczema antecubital, calves Lymphadenopathy- none Head- atraumatic            Eyes- Gross vision intact, PERRLA, conjunctivae and secretions clear            Ears- Hearing, canals-normal            Nose- Clear, no-Septal dev, mucus, polyps, erosion, perforation             Throat- Mallampati II , mucosa+red , +exudate L, drainage- none, tonsils- atrophic Neck- flexible , trachea midline, no stridor , thyroid nl, carotid no bruit Chest - symmetrical excursion , unlabored           Heart/CV- RRR , no murmur , no gallop  , no rub, nl s1 s2                           - JVD- none , edema- none, stasis changes- none, varices- none           Lung- diminished, wheeze-none, cough-none, dullness-none, rub- none           Chest wall-  Abd-  Br/ Gen/ Rectal- Not done, not indicated Extrem- cyanosis- none, clubbing, none, atrophy- none, strength- nl Neuro- grossly intact to observation

## 2021-03-06 ENCOUNTER — Other Ambulatory Visit: Payer: Self-pay

## 2021-03-06 ENCOUNTER — Telehealth: Payer: Self-pay | Admitting: Internal Medicine

## 2021-03-06 ENCOUNTER — Ambulatory Visit: Payer: Medicare HMO | Admitting: Internal Medicine

## 2021-03-06 ENCOUNTER — Encounter: Payer: Self-pay | Admitting: Internal Medicine

## 2021-03-06 VITALS — BP 130/72 | HR 87 | Temp 99.0°F | Ht 61.5 in | Wt 193.6 lb

## 2021-03-06 DIAGNOSIS — J449 Chronic obstructive pulmonary disease, unspecified: Secondary | ICD-10-CM | POA: Diagnosis not present

## 2021-03-06 DIAGNOSIS — J029 Acute pharyngitis, unspecified: Secondary | ICD-10-CM

## 2021-03-06 DIAGNOSIS — J4540 Moderate persistent asthma, uncomplicated: Secondary | ICD-10-CM

## 2021-03-06 DIAGNOSIS — J069 Acute upper respiratory infection, unspecified: Secondary | ICD-10-CM | POA: Diagnosis not present

## 2021-03-06 LAB — POC COVID19 BINAXNOW: SARS Coronavirus 2 Ag: NEGATIVE

## 2021-03-06 MED ORDER — AZITHROMYCIN 250 MG PO TABS: ORAL_TABLET | ORAL | 0 refills | Status: DC

## 2021-03-06 MED ORDER — DUPIXENT 300 MG/2ML ~~LOC~~ SOAJ: 300.0000 mg | SUBCUTANEOUS | 1 refills | Status: DC

## 2021-03-06 NOTE — Telephone Encounter (Signed)
Rx for Dupixent 300mg  SQ every 14days sent to [facility].  Knox Saliva, PharmD, MPH, BCPS Clinical Pharmacist (Rheumatology and Pulmonology)

## 2021-03-06 NOTE — Patient Instructions (Addendum)
Order- Continue Dupixent- speak with the infusion team today about getting that re-ordered  Order- Covid test  Script sent for Zpak antibiotic  Please call if we can help

## 2021-03-06 NOTE — Telephone Encounter (Signed)
Patient was seen in office today and states that she needs new RX for [place] and was told that she had to be seen before it could be filled. Will send to pharmacy team

## 2021-03-09 ENCOUNTER — Other Ambulatory Visit: Payer: Self-pay

## 2021-03-09 ENCOUNTER — Telehealth: Payer: Self-pay | Admitting: Family Medicine

## 2021-03-09 DIAGNOSIS — M5416 Radiculopathy, lumbar region: Secondary | ICD-10-CM

## 2021-03-09 NOTE — Telephone Encounter (Signed)
Order placed. Called patient and left VM.  ?

## 2021-03-09 NOTE — Telephone Encounter (Signed)
Pt never read the MRI results, read to her today. ?She would like to proceed with the epidural, going out of town 3/21 so would like to have done before then. ?

## 2021-03-15 ENCOUNTER — Encounter: Payer: Self-pay | Admitting: Internal Medicine

## 2021-03-16 NOTE — Telephone Encounter (Signed)
Does the pt need a visit? ?

## 2021-03-16 NOTE — Telephone Encounter (Signed)
Pt called back to say that she has tried ginger gum, Pepto bismol, and Zevia.  ? ?Pt states that she has been vomiting over 2 days but nauseous since the day she start the medication. ? ?Pt states that she has taken 8 pills so far. ? ?Please advise [phone] ?

## 2021-03-18 ENCOUNTER — Other Ambulatory Visit: Payer: Self-pay | Admitting: Family Medicine

## 2021-03-20 ENCOUNTER — Encounter: Payer: Self-pay | Admitting: Internal Medicine

## 2021-03-20 DIAGNOSIS — J449 Chronic obstructive pulmonary disease, unspecified: Secondary | ICD-10-CM | POA: Insufficient documentation

## 2021-03-20 NOTE — Assessment & Plan Note (Signed)
While likely viral, strep throat is not ruled out.  I do not have swab available today for strep test. ?Plan-COVID test.  Z-Pak to hold.  Supportive therapies ?

## 2021-03-20 NOTE — Assessment & Plan Note (Signed)
No lower respiratory exacerbation at this point she will continue current meds and call if needed. ?

## 2021-03-28 ENCOUNTER — Other Ambulatory Visit: Payer: Self-pay | Admitting: Family Medicine

## 2021-04-18 ENCOUNTER — Other Ambulatory Visit: Payer: Self-pay | Admitting: Family Medicine

## 2021-04-22 ENCOUNTER — Other Ambulatory Visit: Payer: Self-pay | Admitting: Family Medicine

## 2021-05-11 ENCOUNTER — Telehealth: Payer: Self-pay | Admitting: Internal Medicine

## 2021-05-11 MED ORDER — AMLODIPINE BESYLATE 5 MG PO TABS: ORAL_TABLET | ORAL | 3 refills | Status: DC

## 2021-05-11 NOTE — Telephone Encounter (Signed)
Pt had cpx in Feb sent refills to pharmacy requested.[name] KitchenJohny Chess ?

## 2021-05-11 NOTE — Telephone Encounter (Signed)
Lm for patient.  

## 2021-05-11 NOTE — Telephone Encounter (Signed)
1.Medication Requested: ?amLODipine (NORVASC) 5 MG tablet ?2. Pharmacy (Name, Street, [place]): [id number] [place] Phone:  [phone] ?Fax:  [phone] ?  ? ?3. On Med List: yes ? ?4. Last Visit with PCP: ? ?5. Next visit date with PCP: ? ? ?Agent: Please be advised that RX refills may take up to 3 business days. We ask that you follow-up with your pharmacy.  ?

## 2021-05-16 NOTE — Telephone Encounter (Signed)
Patient is checking on message for RX. Patient phone number is [phone]. ?

## 2021-05-17 NOTE — Telephone Encounter (Signed)
Called patient but she did not answer. Left message for her to call back.  

## 2021-05-19 MED ORDER — TRELEGY ELLIPTA 100-62.5-25 MCG/ACT IN AEPB: 1.0000 | INHALATION_SPRAY | Freq: Every day | RESPIRATORY_TRACT | 3 refills | Status: DC

## 2021-05-19 MED ORDER — ALBUTEROL SULFATE HFA 108 (90 BASE) MCG/ACT IN AERS: INHALATION_SPRAY | RESPIRATORY_TRACT | 3 refills | Status: DC

## 2021-05-19 NOTE — Telephone Encounter (Signed)
I sent 47-monthscripts on for Trelegy and albuterol rescue inhaler. ?Fine to stay off Singulair and see how she does without it. ?

## 2021-05-19 NOTE — Telephone Encounter (Signed)
Pt returning call. Upping status to urgent as pt is nearly out of medication and this has been open since 5/4. Pt has poor reception where she lives so it is hard for her to answer as phone does not always ring. LOV 2/27 and this is a regular prescription for her that just needs to be filled. ?

## 2021-05-19 NOTE — Telephone Encounter (Signed)
ATC patient. Left a detailed VM per DPR to let her know refills have been sent and CY said It was fine for her to stay off the singulair.  ? ?Nothing further needed.  ?

## 2021-05-19 NOTE — Telephone Encounter (Signed)
Called and spoke with patient. She stated she needed trelegy and albuterol refills and she also wants to know if she needs to continue doing the singulair. Patient said she hasn't used singulair in about 6 weeks and she feels like is doing fine without it.  ? ?CY, please advise.  ?

## 2021-05-26 ENCOUNTER — Ambulatory Visit
Admission: RE | Admit: 2021-05-26 | Discharge: 2021-05-26 | Disposition: A | Discharge status: Home / Self Care | Payer: Medicare HMO | Source: Ambulatory Visit | Attending: Internal Medicine | Admitting: Internal Medicine

## 2021-05-26 DIAGNOSIS — R918 Other nonspecific abnormal finding of lung field: Secondary | ICD-10-CM

## 2021-05-26 DIAGNOSIS — I700 Atherosclerosis of aorta: Secondary | ICD-10-CM | POA: Diagnosis not present

## 2021-05-26 DIAGNOSIS — J9811 Atelectasis: Secondary | ICD-10-CM | POA: Diagnosis not present

## 2021-05-26 DIAGNOSIS — J432 Centrilobular emphysema: Secondary | ICD-10-CM | POA: Diagnosis not present

## 2021-05-31 ENCOUNTER — Other Ambulatory Visit: Payer: Self-pay

## 2021-05-31 DIAGNOSIS — J4540 Moderate persistent asthma, uncomplicated: Secondary | ICD-10-CM

## 2021-05-31 MED ORDER — ALBUTEROL SULFATE HFA 108 (90 BASE) MCG/ACT IN AERS: INHALATION_SPRAY | RESPIRATORY_TRACT | 3 refills | Status: DC

## 2021-05-31 MED ORDER — MONTELUKAST SODIUM 10 MG PO TABS: ORAL_TABLET | ORAL | 1 refills | Status: DC

## 2021-05-31 MED ORDER — TRELEGY ELLIPTA 100-62.5-25 MCG/ACT IN AEPB: 1.0000 | INHALATION_SPRAY | Freq: Every day | RESPIRATORY_TRACT | 3 refills | Status: DC

## 2021-06-09 ENCOUNTER — Telehealth: Payer: Self-pay | Admitting: Internal Medicine

## 2021-06-09 DIAGNOSIS — J4540 Moderate persistent asthma, uncomplicated: Secondary | ICD-10-CM

## 2021-06-09 MED ORDER — MONTELUKAST SODIUM 10 MG PO TABS: 10.0000 mg | ORAL_TABLET | Freq: Every day | ORAL | 3 refills | Status: DC

## 2021-06-09 MED ORDER — TRELEGY ELLIPTA 100-62.5-25 MCG/ACT IN AEPB: 1.0000 | INHALATION_SPRAY | Freq: Every day | RESPIRATORY_TRACT | 3 refills | Status: DC

## 2021-06-09 MED ORDER — ALBUTEROL SULFATE HFA 108 (90 BASE) MCG/ACT IN AERS: INHALATION_SPRAY | RESPIRATORY_TRACT | 3 refills | Status: DC

## 2021-06-09 NOTE — Telephone Encounter (Signed)
Called and spoke with patient who is asking for refills to be sent to her preferred pharmacy. She states that she needs a 3 month supply of all of them. Prescriptions have been sent in. Nothing further needed at this time.

## 2021-08-29 ENCOUNTER — Encounter: Payer: Self-pay | Admitting: Family Medicine

## 2021-08-29 ENCOUNTER — Ambulatory Visit: Payer: Medicare HMO | Admitting: Family Medicine

## 2021-08-29 ENCOUNTER — Ambulatory Visit (INDEPENDENT_AMBULATORY_CARE_PROVIDER_SITE_OTHER): Payer: Medicare HMO

## 2021-08-29 VITALS — BP 124/76 | HR 72 | Ht 61.0 in | Wt 191.0 lb

## 2021-08-29 DIAGNOSIS — M25561 Pain in right knee: Secondary | ICD-10-CM | POA: Diagnosis not present

## 2021-08-29 DIAGNOSIS — M5136 Other intervertebral disc degeneration, lumbar region: Secondary | ICD-10-CM

## 2021-08-29 DIAGNOSIS — M2550 Pain in unspecified joint: Secondary | ICD-10-CM

## 2021-08-29 DIAGNOSIS — M170 Bilateral primary osteoarthritis of knee: Secondary | ICD-10-CM | POA: Diagnosis not present

## 2021-08-29 DIAGNOSIS — F1010 Alcohol abuse, uncomplicated: Secondary | ICD-10-CM | POA: Diagnosis not present

## 2021-08-29 LAB — COMPREHENSIVE METABOLIC PANEL
ALT: 131 U/L — ABNORMAL HIGH (ref 0–35)
AST: 100 U/L — ABNORMAL HIGH (ref 0–37)
Albumin: 4.5 g/dL (ref 3.5–5.2)
Alkaline Phosphatase: 85 U/L (ref 39–117)
BUN: 11 mg/dL (ref 6–23)
CO2: 28 mEq/L (ref 19–32)
Calcium: 9.7 mg/dL (ref 8.4–10.5)
Chloride: 101 mEq/L (ref 96–112)
Creatinine, Ser: 0.68 mg/dL (ref 0.40–1.20)
GFR: 88.25 mL/min (ref >60.00)
Glucose, Bld: 171 mg/dL — ABNORMAL HIGH (ref 70–99)
Potassium: 4 mEq/L (ref 3.5–5.1)
Sodium: 138 mEq/L (ref 135–145)
Total Bilirubin: 0.6 mg/dL (ref 0.2–1.2)
Total Protein: 7.7 g/dL (ref 6.0–8.3)

## 2021-08-29 NOTE — Assessment & Plan Note (Signed)
Patient is still working on her alcohol use.  Did have some elevation in liver enzymes and we will recheck to make sure that they are downtrending.  Otherwise may need to refer accordingly.

## 2021-08-29 NOTE — Patient Instructions (Addendum)
Labs today Lets see where liver enzymes are Keep being active Doing well Xray today

## 2021-08-29 NOTE — Assessment & Plan Note (Signed)
Patient does have some arthritic changes noted.  Discussed posture and ergonomics, which activities to do and which ones to avoid.  Patient still wants to continue with conservative therapy.  Discussed that we have different treatment options still available to her.  Total time 33 minutes.

## 2021-08-29 NOTE — Assessment & Plan Note (Signed)
Chronic but seems to be doing relatively stable at the moment.  Held on any other injections.  We will get new x-rays to further evaluate.  Continue with conservative therapy otherwise.

## 2021-08-29 NOTE — Progress Notes (Signed)
[place] Phone: [phone] Subjective:   Toni Hicks, am serving as a scribe for Dr. Hulan Saas.  I'm seeing this patient by the request  of:  Hoyt Koch, MD  CC: right knee pain , back pain   [id number]  Toni Hicks is a 71 y.o. female coming in with complaint of R knee pain since last Wednesday. Patient last seen in January for lumbar spine pain. Patient states that she was swimming with fins and twisted her knee. Had hard time putting weight on the knee when trying to get out of the pool. Swelling over medial aspect and superior lateral aspect. Went to bed that night and she felt some popping and her knee pain subsided. Pain is even better than past few years.   Also f/u for back pain. Pain is not as bad as it was initially but she does have pain with picking up her granddaughter. Does feel like Meloxicam 7.5 is helpful. Is worried about getting epidural injection and didn't get it due to being on vacation and pain is less.     MRI of lumbar Moderate compression of the thecal sac at L4-5 due to a combination of degenerative disease and epidural fat. There is also mild bilateral foraminal narrowing at L4-5.   Mild to moderate compression of the thecal sac at L3-4 due to a combination degenerative change and prominent epidural fat.  Was to get epidural and never went through per orders  CT chest stable from Dr. Annamaria Boots      Past Medical History:  Diagnosis Date   ADD (attention deficit disorder)    Adult acne    Alcohol abuse    Allergic rhinitis    Anxiety and depression    Asthma    Congenital pneumonia    Eczema    Hypoglycemia    Hicks longer per pt    Hypothyroidism    Impaired fasting blood sugar    Mycoplasma pneumonia    Restless leg syndrome    Seasonal allergies    Stroke ([place])    Urticaria    Past Surgical History:  Procedure Laterality Date   DILATION AND  CURETTAGE OF UTERUS     @ 71 years old   TOTAL HIP ARTHROPLASTY Left 07/29/2018   Procedure: Left Anterior Hip Arthroplasty;  Surgeon: Melrose Nakayama, MD;  Location: [facility];  Service: Orthopedics;  Laterality: Left;   WISDOM TOOTH EXTRACTION     Social History   Socioeconomic History   Marital status: Widowed    Spouse name: has boyfriend Sam   Number of children: 2   Years of education: 17   Highest education level: Some college, Hicks degree  Occupational History   Occupation: Clinical biochemist    Comment: Semi-retired  Tobacco Use   Smoking status: Former    Packs/day: 2.00    Years: 15.00    Total pack years: 30.00    Types: Cigarettes    Quit date: 1980    Years since quitting: 43.6   Smokeless tobacco: Never  Vaping Use   Vaping Use: Former  Substance and Sexual Activity   Alcohol use: Yes    Alcohol/week: 0.0 standard drinks of alcohol    Comment: 3-5 glasses of wine day    Drug use: Hicks   Sexual activity: Not on file  Other Topics Concern   Not on file  Social History Narrative   HSG, [employer]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   Social Determinants of Radio broadcast assistant Strain: Not on Art therapist Insecurity: Not on file  Transportation Needs: Not on file  Physical Activity: Not on file  Stress: Not on file  Social Connections: Not on file   Allergies  Allergen Reactions   Astelin [Azelastine] Other (See Comments)    Per patient caused psychiatric illness   Nasonex [Mometasone] Other (See Comments)    Per patient caused psychiatric illness   Other     Can not eat carbohydrates without protein, is allergic to certain foods but can take in certain doses   Family History  Problem Relation Age of Onset   COPD Father    Heart failure Father     Coronary artery disease Father    Cancer Mother        breast   Hypertension Mother    Dementia Mother    Stroke Mother    Breast cancer Mother     Current Outpatient Medications (Endocrine & Metabolic):    Semaglutide (RYBELSUS) 7 MG TABS, Take 7 mg by mouth daily.  Current Outpatient Medications (Cardiovascular):    amLODipine (NORVASC) 5 MG tablet, TAKE 1 TABLET BY MOUTH DAILY   EPINEPHrine 0.3 mg/0.3 mL IJ SOAJ injection, Inject 0.3 mg into the muscle as needed for anaphylaxis.  Current Outpatient Medications (Respiratory):    albuterol (PROVENTIL) (2.5 MG/3ML) 0.083% nebulizer solution, Take 3 mLs (2.5 mg total) by nebulization every 6 (six) hours as needed for wheezing or shortness of breath.   albuterol (VENTOLIN HFA) 108 (90 Base) MCG/ACT inhaler, INHALE 1-2 PUFFS BY MOUTH EVERY 6 HOURS AS NEEDED FOR WHEEZE OR SHORTNESS OF BREATH   benzonatate (TESSALON) 200 MG capsule, Take 1 capsule (200 mg total) by mouth 2 (two) times daily as needed for cough.   Fluticasone-Umeclidin-Vilant (TRELEGY ELLIPTA) 100-62.5-25 MCG/ACT AEPB, Inhale 1 puff into the lungs daily. Rinse mouth   montelukast (SINGULAIR) 10 MG tablet, Take 1 tablet (10 mg total) by mouth at bedtime.   promethazine-dextromethorphan (PROMETHAZINE-DM) 6.25-15 MG/5ML syrup, Take 5 mLs by mouth 4 (four) times daily as needed for cough.   TRELEGY ELLIPTA 100-62.5-25 MCG/INH AEPB, TAKE 1 PUFF BY MOUTH DAILY. RINSE MOUTH  Current Outpatient Medications (Analgesics):    acetaminophen (TYLENOL) 500 MG tablet, Take 500 mg by mouth every 8 (eight) hours as needed.    meloxicam (MOBIC) 7.5 MG tablet, TAKE 1 TABLET BY MOUTH EVERY DAY   Current Outpatient Medications (Other):    azithromycin (ZITHROMAX) 250 MG tablet, 2 today then one daily   b complex vitamins tablet, Take 1 tablet by mouth 3 (three) times a week.   clotrimazole-betamethasone (LOTRISONE) cream, APPLY TOPICALLY TWICE A DAY   Dupilumab (DUPIXENT) 300 MG/2ML SOPN,  Inject 300 mg into the skin every 14 (fourteen) days.   fluocinonide cream (LIDEX) 7.74 %, Apply 1 application topically 2 (two) times daily as needed.   hydrocortisone 2.5 % ointment, Apply topically 2 (two) times daily.   hydrOXYzine (ATARAX) 10 MG tablet, TAKE 1 TABLET BY MOUTH EVERYDAY AT BEDTIME   Vitamin D, Ergocalciferol, (DRISDOL) 1.25 MG (50000 UNIT) CAPS capsule, TAKE 1 CAPSULE (50,000 UNITS TOTAL) BY MOUTH EVERY 7 (SEVEN) DAYS   Reviewed prior external information including notes and imaging from  primary care provider As well as notes that were available from care everywhere and other healthcare systems.  Past medical history, social, surgical and family history all reviewed in electronic medical record.  Hicks pertanent information unless stated regarding to the chief complaint.   Review of Systems:  Hicks headache, visual changes, nausea, vomiting, diarrhea, constipation, dizziness, abdominal pain, skin rash, fevers, chills, night sweats, weight loss, swollen lymph nodes, body aches, joint swelling, chest pain, shortness of breath, mood changes. POSITIVE muscle aches  Objective  Blood pressure 124/76, pulse 72, height '5\' 1"'$  (1.549 m), weight 191 lb (86.6 kg), SpO2 94 %.   General: Hicks apparent distress alert and oriented x3 mood and affect normal, dressed appropriately.  HEENT: Pupils equal, extraocular movements intact  Respiratory: Patient's speak in full sentences and does not appear short of breath  Cardiovascular: Hicks lower extremity edema, non tender, Hicks erythema  Low back exam shows loss of lordosis.  Does have difficulty with Corky Sox bilateral. Right knee exam shows trace effusion noted.  Patient does have instability with valgus and varus force.  Flexion is lacking the last 10 degrees   A Impression and Recommendations:     The above documentation has been reviewed and is accurate and complete Lyndal Pulley, DO

## 2021-09-04 ENCOUNTER — Ambulatory Visit (INDEPENDENT_AMBULATORY_CARE_PROVIDER_SITE_OTHER): Payer: Medicare HMO | Admitting: Family Medicine

## 2021-09-04 ENCOUNTER — Encounter: Payer: Self-pay | Admitting: Family Medicine

## 2021-09-04 DIAGNOSIS — M1711 Unilateral primary osteoarthritis, right knee: Secondary | ICD-10-CM | POA: Diagnosis not present

## 2021-09-04 DIAGNOSIS — M170 Bilateral primary osteoarthritis of knee: Secondary | ICD-10-CM

## 2021-09-04 NOTE — Patient Instructions (Signed)
Enjoy the [employer] Injected the knee today See me again as scheduled

## 2021-09-04 NOTE — Progress Notes (Signed)
[place] Phone: [phone] Subjective:   [name], am serving as a scribe for Dr. Hulan Saas.  I'm seeing this patient by the request  of:  Hoyt Koch, MD  CC: knee exam shows   FHL:[id number]  BLIMI GODBY is a 71 y.o. female coming in with complaint of R knee pain. Patient states that she was in bed and extended knee and had sharp pain. Pain throughout the entire joint. Was using a walker and knee brace for the weekend.       Past Medical History:  Diagnosis Date   ADD (attention deficit disorder)    Adult acne    Alcohol abuse    Allergic rhinitis    Anxiety and depression    Asthma    Congenital pneumonia    Eczema    Hypoglycemia    no longer per pt    Hypothyroidism    Impaired fasting blood sugar    Mycoplasma pneumonia    Restless leg syndrome    Seasonal allergies    Stroke ([place])    Urticaria    Past Surgical History:  Procedure Laterality Date   DILATION AND CURETTAGE OF UTERUS     @ 71 years old   TOTAL HIP ARTHROPLASTY Left 07/29/2018   Procedure: Left Anterior Hip Arthroplasty;  Surgeon: Melrose Nakayama, MD;  Location: [facility];  Service: Orthopedics;  Laterality: Left;   WISDOM TOOTH EXTRACTION     Social History   Socioeconomic History   Marital status: Widowed    Spouse name: has boyfriend Sam   Number of children: 2   Years of education: 17   Highest education level: Some college, no degree  Occupational History   Occupation: Clinical biochemist    Comment: Semi-retired  Tobacco Use   Smoking status: Former    Packs/day: 2.00    Years: 15.00    Total pack years: 30.00    Types: Cigarettes    Quit date: 1980    Years since quitting: 43.6   Smokeless tobacco: Never  Vaping Use   Vaping Use: Former  Substance and Sexual Activity   Alcohol use: Yes    Alcohol/week: 0.0 standard drinks of alcohol    Comment: 3-5 glasses of wine day    Drug  use: No   Sexual activity: Not on file  Other Topics Concern   Not on file  Social History Narrative   HSG, [employer] MA- photography   Married '73- 10 years divorced, married '86- 84 years divorced, Married '96- 23 years- widowed.   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   Social Determinants of Radio broadcast assistant Strain: Not on Art therapist Insecurity: Not on file  Transportation Needs: Not on file  Physical Activity: Not on file  Stress: Not on file  Social Connections: Not on file   Allergies  Allergen Reactions   Astelin [Azelastine] Other (See Comments)    Per patient caused psychiatric illness   Nasonex [Mometasone] Other (See Comments)    Per patient caused psychiatric illness   Other     Can not eat carbohydrates without protein, is allergic to certain foods but can take in certain doses   Family History  Problem Relation Age of Onset   COPD Father    Heart failure Father    Coronary artery disease Father  Cancer Mother        breast   Hypertension Mother    Dementia Mother    Stroke Mother    Breast cancer Mother     Current Outpatient Medications (Endocrine & Metabolic):    Semaglutide (RYBELSUS) 7 MG TABS, Take 7 mg by mouth daily.  Current Outpatient Medications (Cardiovascular):    amLODipine (NORVASC) 5 MG tablet, TAKE 1 TABLET BY MOUTH DAILY   EPINEPHrine 0.3 mg/0.3 mL IJ SOAJ injection, Inject 0.3 mg into the muscle as needed for anaphylaxis.  Current Outpatient Medications (Respiratory):    albuterol (PROVENTIL) (2.5 MG/3ML) 0.083% nebulizer solution, Take 3 mLs (2.5 mg total) by nebulization every 6 (six) hours as needed for wheezing or shortness of breath.   albuterol (VENTOLIN HFA) 108 (90 Base) MCG/ACT inhaler, INHALE 1-2 PUFFS BY MOUTH EVERY 6 HOURS AS NEEDED FOR WHEEZE OR SHORTNESS OF BREATH   benzonatate (TESSALON) 200 MG capsule, Take 1 capsule (200 mg  total) by mouth 2 (two) times daily as needed for cough.   Fluticasone-Umeclidin-Vilant (TRELEGY ELLIPTA) 100-62.5-25 MCG/ACT AEPB, Inhale 1 puff into the lungs daily. Rinse mouth   montelukast (SINGULAIR) 10 MG tablet, Take 1 tablet (10 mg total) by mouth at bedtime.   promethazine-dextromethorphan (PROMETHAZINE-DM) 6.25-15 MG/5ML syrup, Take 5 mLs by mouth 4 (four) times daily as needed for cough.   TRELEGY ELLIPTA 100-62.5-25 MCG/INH AEPB, TAKE 1 PUFF BY MOUTH DAILY. RINSE MOUTH  Current Outpatient Medications (Analgesics):    acetaminophen (TYLENOL) 500 MG tablet, Take 500 mg by mouth every 8 (eight) hours as needed.    meloxicam (MOBIC) 7.5 MG tablet, TAKE 1 TABLET BY MOUTH EVERY DAY   Current Outpatient Medications (Other):    azithromycin (ZITHROMAX) 250 MG tablet, 2 today then one daily   b complex vitamins tablet, Take 1 tablet by mouth 3 (three) times a week.   clotrimazole-betamethasone (LOTRISONE) cream, APPLY TOPICALLY TWICE A DAY   Dupilumab (DUPIXENT) 300 MG/2ML SOPN, Inject 300 mg into the skin every 14 (fourteen) days.   fluocinonide cream (LIDEX) 4.48 %, Apply 1 application topically 2 (two) times daily as needed.   hydrocortisone 2.5 % ointment, Apply topically 2 (two) times daily.   hydrOXYzine (ATARAX) 10 MG tablet, TAKE 1 TABLET BY MOUTH EVERYDAY AT BEDTIME   Vitamin D, Ergocalciferol, (DRISDOL) 1.25 MG (50000 UNIT) CAPS capsule, TAKE 1 CAPSULE (50,000 UNITS TOTAL) BY MOUTH EVERY 7 (SEVEN) DAYS   Reviewed prior external information including notes and imaging from  primary care provider As well as notes that were available from care everywhere and other healthcare systems.  Past medical history, social, surgical and family history all reviewed in electronic medical record.  No pertanent information unless stated regarding to the chief complaint.   Review of Systems:  No headache, visual changes, nausea, vomiting, diarrhea, constipation, dizziness, abdominal pain,  skin rash, fevers, chills, night sweats, weight loss, swollen lymph nodes, body aches, joint swelling, chest pain, shortness of breath, mood changes. POSITIVE muscle aches  Objective  Blood pressure 122/76, height '5\' 1"'$  (1.549 m).   General: No apparent distress alert and oriented x3 mood and affect normal, dressed appropriately.  HEENT: Pupils equal, extraocular movements intact  Respiratory: Patient's speak in full sentences and does not appear short of breath  Cardiovascular: No lower extremity edema, non tender, no erythema  Antalgic gait noted.  Some instability noted with valgus and varus force.  Trace effusion noted. Patient's right knee does have pain as well.  After informed written  and verbal consent, patient was seated on exam table. Right knee was prepped with alcohol swab and utilizing anterolateral approach, patient's right knee space was injected with 4:1  marcaine 0.5%: Kenalog '40mg'$ /dL. Patient tolerated the procedure well without immediate complications.   Impression and Recommendations:    The above documentation has been reviewed and is accurate and complete Lyndal Pulley, DO

## 2021-09-04 NOTE — Assessment & Plan Note (Signed)
Patient had an injection today.  Tolerated the procedure well, discussed icing regimen and home exercises, which activities to do and which ones to avoid.  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  Has meloxicam for breakthrough.  Patient did have elevated liver enzymes and told her to not take any Tylenol.  Awaiting follow-up with her primary care for the liver enzymes.  Follow-up with me again in 6 weeks

## 2021-09-05 ENCOUNTER — Telehealth: Payer: Self-pay | Admitting: Pharmacist

## 2021-09-05 DIAGNOSIS — J4540 Moderate persistent asthma, uncomplicated: Secondary | ICD-10-CM

## 2021-09-05 MED ORDER — DUPIXENT 300 MG/2ML ~~LOC~~ SOAJ: 300.0000 mg | SUBCUTANEOUS | 0 refills | Status: DC

## 2021-09-05 NOTE — Telephone Encounter (Signed)
10/17/21 at 10:30am

## 2021-09-05 NOTE — Telephone Encounter (Signed)
Refill sent for [place] to [facility]: [phone]  Dose: 300 mg SQ every 2 weeks  Last OV: 03/06/21 Provider: Dr. Annamaria Boots  Next OV: should be in August (not scheduled). Routing to scheduling team to ensure patient has appt. Refill sent for only 3 month supply so that she can have appointment scheduled  Knox Saliva, PharmD, MPH, BCPS Clinical Pharmacist (Rheumatology and Pulmonology)

## 2021-10-15 NOTE — Progress Notes (Signed)
HPI F former smoker followed for OSA, complicated by Restless Legs, ETOH abuse, ETOH induced insomnia, Allergic rhinitis/ conjunctivitis, Nasal polyps, anxiety, depression, HBP, CVA, Hypothyroid, Asthma, Urticaria, DM NPSG Dohmeier 04/08/2017-  AHI 11.25/ hr, desaturation to 53%, 174 lbs Office Spirometry 03/20/2018 (Allergy and Asthma) Moderate obstruction, with FVC 1.84/ 70%, FEV1 1.09/ 54%, ratio 0.59/ 77%, FEF25-75% 0.44/ 24%. 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, Moxifloxacin Lab- IgE H 326 09/16/19 ---------------------------------------------------------------------------------------------------  03/06/21- 70 yoF former smoker followed for COPD, Lung Nodules, hx Recurrent Pneumonias, Atypical AFB,  complicated by OSA ( Dr Dohmeier/ [facility]), Restless Legs, ETOH abuse, ETOH induced insomnia, Allergic rhinitis/ conjunctivitis(Dr Padgett), Nasal polyps, anxiety, depression, HBP, CVA, Nasal Septal Perforation,  Hypothyroid,  Urticaria, DM2, Covid infection 2022  Covid vax- 4 Phizer, 1 Moderna,    (CPAP 9/ [facility]- followed by [facility]/ Dr Maureen Chatters) -Ventolin hfa, Singulair, Trelegy 100, Neb albuterol, Dupixent, -----Patient states that she is out of [place].  Feels that Dupixent has made a significant improvement and she is afraid to run out.  This will be directed to the pharmacy/infusion team. Today complains of sore throat without definite fever, nodes, cough or earache.  10/17/21-  40 yoF former smoker followed for COPD, Lung Nodules, hx Recurrent Pneumonias, Atypical AFB,  complicated by OSA ( Dr Dohmeier/ [facility]), Restless Legs, ETOH abuse, ETOH induced insomnia, Allergic rhinitis/ conjunctivitis(Dr Padgett), Nasal polyps, anxiety, depression, HBP, CVA, Nasal Septal Perforation,  Hypothyroid,  Urticaria, DM2, Covid infection 2022  Covid vax- 4 Phizer, 1 Moderna, Flu vax-    declines (CPAP 9/ [facility]- followed by [facility]/ Dr Maureen Chatters) 100%, AHI 2.3/ hr -Ventolin hfa, Singulair, Trelegy 100, Neb albuterol, Dupixent, ACT score 20 -----Pt has been stopped up some due to allergies but is doing okay  Very pleased with her asthma control on Trelegy plus Dupixent. Still moderate seasonal flare of rhinitis with drainage. Flonase had caused mood change. Discussed cromolyn and azelastine options. She is proud of quitting ETOH "55 days agoChief of Staff. CT chest 05/26/21-  IMPRESSION: 1. Multiple small bilateral pulmonary nodules are stable and definitively benign. No further follow-up or characterization is required for these nodules. 2. Minimal emphysema. Aortic Atherosclerosis (ICD10-I70.0) and Emphysema (ICD10-J43.9).   ROS-see HPI   + = positive Constitutional:    weight loss, night sweats, fevers, chills, fatigue, lassitude. HEENT:    headaches, difficulty swallowing, tooth/dental problems, sore throat,       sneezing, itching, ear ache, +nasal congestion, post nasal drip, snoring CV:    chest pain, orthopnea, PND, swelling in lower extremities, anasarca,                                   dizziness, +palpitations Resp:   +shortness of breath with exertion or at rest.                +productive cough,   non-productive cough, coughing up of blood.              change in color of mucus.  wheezing.   Skin:    +rash or lesions. GI:    heartburn, +indigestion, abdominal pain, nausea, vomiting, diarrhea,                 change in bowel habits, loss of appetite GU: dysuria, change in color of urine, no urgency or  frequency.   flank pain. MS:   joint pain, +stiffness, decreased range of motion, back pain. Neuro-     nothing unusual Psych:  change in mood or affect.  depression or +anxiety.   memory loss.  OBJ- Physical Exam General- Alert, Oriented, Affect-appropriate, Distress- none acute, +obese Skin- + eczema antecubital, calves Lymphadenopathy- none Head-  atraumatic            Eyes- Gross vision intact, PERRLA, conjunctivae and secretions clear            Ears- Hearing, canals-normal            Nose- Clear, no-Septal dev, mucus, polyps, erosion, perforation             Throat- Mallampati II , mucosa+red , +exudate L, drainage- none, tonsils- atrophic Neck- flexible , trachea midline, no stridor , thyroid nl, carotid no bruit Chest - symmetrical excursion , unlabored           Heart/CV- RRR , no murmur , no gallop  , no rub, nl s1 s2                           - JVD- none , edema- none, stasis changes- none, varices- none           Lung- diminished/ clear, wheeze-none, cough-none, dullness-none, rub- none           Chest wall-  Abd-  Br/ Gen/ Rectal- Not done, not indicated Extrem- cyanosis- none, clubbing, none, atrophy- none, strength- nl Neuro- grossly intact to observation

## 2021-10-17 ENCOUNTER — Encounter: Payer: Self-pay | Admitting: Internal Medicine

## 2021-10-17 ENCOUNTER — Ambulatory Visit: Payer: Medicare HMO | Admitting: Internal Medicine

## 2021-10-17 DIAGNOSIS — J309 Allergic rhinitis, unspecified: Secondary | ICD-10-CM

## 2021-10-17 DIAGNOSIS — J449 Chronic obstructive pulmonary disease, unspecified: Secondary | ICD-10-CM | POA: Diagnosis not present

## 2021-10-17 DIAGNOSIS — J4540 Moderate persistent asthma, uncomplicated: Secondary | ICD-10-CM

## 2021-10-17 MED ORDER — MONTELUKAST SODIUM 10 MG PO TABS: 10.0000 mg | ORAL_TABLET | Freq: Every day | ORAL | 3 refills | Status: DC

## 2021-10-17 MED ORDER — ALBUTEROL SULFATE HFA 108 (90 BASE) MCG/ACT IN AERS: INHALATION_SPRAY | RESPIRATORY_TRACT | 3 refills | Status: DC

## 2021-10-17 MED ORDER — TRELEGY ELLIPTA 100-62.5-25 MCG/ACT IN AEPB: INHALATION_SPRAY | RESPIRATORY_TRACT | 4 refills | Status: DC

## 2021-10-17 NOTE — Patient Instructions (Signed)
Scripts sent refilling Singulair,albuterol and Trelegy  Try otc Nasalcrom (cromolyn) nasal spray. Follow directions on box See if this will help your stuffy nose without mood changes.

## 2021-10-17 NOTE — Assessment & Plan Note (Signed)
If symptoms remain well controlled, we may consider repeat  PFT in future

## 2021-10-17 NOTE — Assessment & Plan Note (Signed)
Seasonal exacerbation Intolerant of flonase (steroid mood effect?).  Plan- try Nasalcrom, then consider azelastine

## 2021-11-05 ENCOUNTER — Other Ambulatory Visit: Payer: Self-pay | Admitting: Family Medicine

## 2021-11-22 ENCOUNTER — Ambulatory Visit: Payer: Self-pay | Admitting: Licensed Clinical Social Worker

## 2021-11-22 NOTE — Patient Instructions (Signed)
Visit Information  Thank you for taking time to visit with me today. Please don't hesitate to contact me if I can be of assistance to you before our next scheduled telephone appointment.  Following are the goals we discussed today:   Our next appointment is by telephone on 12/26/21 at 2:30 PM   Please call the care guide team at [phone] if you need to cancel or reschedule your appointment.   If you are experiencing a Mental Health or [place] or need someone to talk to, please go to [facility] [place] [phone])   Following is a copy of your full plan of care:   Interventions: LCSW called client phone number today but was not able to speak via phone with client. LCSW left phone message for client asking her to call LCSW at [phone]  Toni Hicks was given information about Care Management services by the embedded care coordination team including:  Care Management services include personalized support from designated clinical staff supervised by her physician, including individualized plan of care and coordination with other care providers 24/7 contact phone numbers for assistance for urgent and routine care needs. The patient may stop CCM services at any time (effective at the end of the month) by phone call to the office staff.  Patient agreed to services and verbal consent obtained.   Toni Hicks.Toni Hicks MSW, [place] [facility] representative [facility] Care Management [phone]

## 2021-11-22 NOTE — Patient Outreach (Signed)
  Care Coordination   11/22/2021 Name: Toni Hicks MRN: [id number] DOB: 01-11-1950   Care Coordination Outreach Attempts:  An unsuccessful telephone outreach was attempted today to offer the patient information about available care coordination services as a benefit of their health plan.   Follow Up Plan:  Additional outreach attempts will be made to offer the patient care coordination information and services.   Encounter Outcome:  No Answer  Care Coordination Interventions Activated:  Yes   Care Coordination Interventions:  Yes, provided    Norva Riffle.Areona Homer MSW, [place] [facility] representative [facility] Care Management [phone]

## 2021-11-24 NOTE — Progress Notes (Unsigned)
[name] Phone: [phone] Subjective:   Toni Hicks, am serving as a scribe for Dr. Hulan Saas.  I'm seeing this patient by the request  of:  Hoyt Koch, MD  CC: bilateral knee pain   OEV:[id number]  09/04/2021 Patient had an injection today.  Tolerated the procedure well, discussed icing regimen and home exercises, which activities to do and which ones to avoid.  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  Has meloxicam for breakthrough.  Patient did have elevated liver enzymes and told her to not take any Tylenol.  Awaiting follow-up with her primary care for the liver enzymes.  Follow-up with me again in 6 weeks      Update 11/28/2021 Toni Hicks is a 71 y.o. female coming in with complaint of B knee pain. Patient states that she is doing well. Injections helped last visit to alleviate her pain. Wears brace which has also been helpful.        Past Medical History:  Diagnosis Date   ADD (attention deficit disorder)    Adult acne    Alcohol abuse    Allergic rhinitis    Anxiety and depression    Asthma    Congenital pneumonia    Eczema    Hypoglycemia    Hicks longer per pt    Hypothyroidism    Impaired fasting blood sugar    Mycoplasma pneumonia    Restless leg syndrome    Seasonal allergies    Stroke ([place])    Urticaria    Past Surgical History:  Procedure Laterality Date   DILATION AND CURETTAGE OF UTERUS     @ 71 years old   TOTAL HIP ARTHROPLASTY Left 07/29/2018   Procedure: Left Anterior Hip Arthroplasty;  Surgeon: Melrose Nakayama, MD;  Location: [facility];  Service: Orthopedics;  Laterality: Left;   WISDOM TOOTH EXTRACTION     Social History   Socioeconomic History   Marital status: Widowed    Spouse name: has boyfriend Sam   Number of children: 2   Years of education: 17   Highest education level: Some college, Hicks degree  Occupational History    Occupation: Clinical biochemist    Comment: Semi-retired  Tobacco Use   Smoking status: Former    Packs/day: 2.00    Years: 15.00    Total pack years: 30.00    Types: Cigarettes    Quit date: 1980    Years since quitting: 43.9   Smokeless tobacco: Never  Vaping Use   Vaping Use: Former  Substance and Sexual Activity   Alcohol use: Yes    Alcohol/week: 0.0 standard drinks of alcohol    Comment: 3-5 glasses of wine day    Drug use: Hicks   Sexual activity: Not on file  Other Topics Concern   Not on file  Social History Narrative   HS[employer], [employer] MA- photography   Married '73- 10 years divorced, married '86- 69 years divorced, Married '96- 44 years- widowed.   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   Social Determinants of Radio broadcast assistant Strain: Not on [employer] Insecurity: Not on file  Transportation Needs: Not on file  Physical Activity: Not on file  Stress: Not on file  Social Connections: Not on file   Allergies  Allergen Reactions   Astelin [Azelastine] Other (See Comments)  Per patient caused psychiatric illness   Nasonex [Mometasone] Other (See Comments)    Per patient caused psychiatric illness   Other     Can not eat carbohydrates without protein, is allergic to certain foods but can take in certain doses   Family History  Problem Relation Age of Onset   COPD Father    Heart failure Father    Coronary artery disease Father    Cancer Mother        breast   Hypertension Mother    Dementia Mother    Stroke Mother    Breast cancer Mother      Current Outpatient Medications (Cardiovascular):    amLODipine (NORVASC) 5 MG tablet, TAKE 1 TABLET BY MOUTH DAILY   EPINEPHrine 0.3 mg/0.3 mL IJ SOAJ injection, Inject 0.3 mg into the muscle as needed for anaphylaxis.  Current Outpatient Medications (Respiratory):    albuterol (PROVENTIL) (2.5 MG/3ML) 0.083% nebulizer  solution, Take 3 mLs (2.5 mg total) by nebulization every 6 (six) hours as needed for wheezing or shortness of breath.   albuterol (VENTOLIN HFA) 108 (90 Base) MCG/ACT inhaler, INHALE 1-2 PUFFS BY MOUTH EVERY 6 HOURS AS NEEDED FOR WHEEZE OR SHORTNESS OF BREATH   Fluticasone-Umeclidin-Vilant (TRELEGY ELLIPTA) 100-62.5-25 MCG/ACT AEPB, Inhale 1 puff then rinse mouth, once daily   montelukast (SINGULAIR) 10 MG tablet, Take 1 tablet (10 mg total) by mouth at bedtime.  Current Outpatient Medications (Analgesics):    acetaminophen (TYLENOL) 500 MG tablet, Take 500 mg by mouth every 8 (eight) hours as needed.    meloxicam (MOBIC) 7.5 MG tablet, Take 1 tablet (7.5 mg total) by mouth daily.   Current Outpatient Medications (Other):    b complex vitamins tablet, Take 1 tablet by mouth 3 (three) times a week.   clotrimazole-betamethasone (LOTRISONE) cream, APPLY TOPICALLY TWICE A DAY   Dupilumab (DUPIXENT) 300 MG/2ML SOPN, Inject 300 mg into the skin every 14 (fourteen) days.   fluocinonide cream (LIDEX) 1.61 %, Apply 1 application topically 2 (two) times daily as needed.   hydrocortisone 2.5 % ointment, Apply topically 2 (two) times daily.   hydrOXYzine (ATARAX) 10 MG tablet, TAKE 1 TABLET BY MOUTH EVERYDAY AT BEDTIME   traZODone (DESYREL) 50 MG tablet, Take 0.5-1 tablets (25-50 mg total) by mouth at bedtime as needed for sleep.   Vitamin D, Ergocalciferol, (DRISDOL) 1.25 MG (50000 UNIT) CAPS capsule, TAKE 1 CAPSULE (50,000 UNITS TOTAL) BY MOUTH EVERY 7 (SEVEN) DAYS   Reviewed prior external information including notes and imaging from  primary care provider As well as notes that were available from care everywhere and other healthcare systems.  Past medical history, social, surgical and family history all reviewed in electronic medical record.  Hicks pertanent information unless stated regarding to the chief complaint.   Review of Systems:  Hicks headache, visual changes, nausea, vomiting, diarrhea,  constipation, dizziness, abdominal pain, skin rash, fevers, chills, night sweats, weight loss, swollen lymph nodes, joint swelling, chest pain, shortness of breath, mood changes. POSITIVE muscle aches, body aches   Objective  Blood pressure 130/82, pulse 75, height '5\' 2"'$  (1.575 m), weight 197 lb (89.4 kg), SpO2 97 %.   General: Hicks apparent distress alert and oriented x3 mood and affect normal, dressed appropriately.  HEENT: Pupils equal, extraocular movements intact  Respiratory: Patient's speak in full sentences and does not appear short of breath  Cardiovascular: Hicks lower extremity edema, non tender, Hicks erythema  Back exam does have significant loss of lordosis.  Patient does  have tightness noted especially with extension of the back.  Hicks true radicular symptoms.  Does have some atrophy though of the lower extremities bilaterally.    Knees do have some arthritis bilaterally.  instability with valgus and varus force    Impression and Recommendations:     The above documentation has been reviewed and is accurate and complete Lyndal Pulley, DO

## 2021-11-28 ENCOUNTER — Encounter: Payer: Self-pay | Admitting: Family Medicine

## 2021-11-28 ENCOUNTER — Ambulatory Visit: Payer: Medicare HMO | Admitting: Family Medicine

## 2021-11-28 VITALS — BP 130/82 | HR 75 | Ht 62.0 in | Wt 197.0 lb

## 2021-11-28 DIAGNOSIS — G4700 Insomnia, unspecified: Secondary | ICD-10-CM | POA: Diagnosis not present

## 2021-11-28 DIAGNOSIS — M5136 Other intervertebral disc degeneration, lumbar region: Secondary | ICD-10-CM

## 2021-11-28 DIAGNOSIS — M170 Bilateral primary osteoarthritis of knee: Secondary | ICD-10-CM

## 2021-11-28 DIAGNOSIS — M2550 Pain in unspecified joint: Secondary | ICD-10-CM | POA: Diagnosis not present

## 2021-11-28 LAB — LIPID PANEL
Cholesterol: 150 mg/dL (ref 0–200)
HDL: 58.6 mg/dL (ref >39.00)
LDL Cholesterol: 65 mg/dL (ref 0–99)
NonHDL: 91.38
Total CHOL/HDL Ratio: 3
Triglycerides: 133 mg/dL (ref 0.0–149.0)
VLDL: 26.6 mg/dL (ref 0.0–40.0)

## 2021-11-28 LAB — COMPREHENSIVE METABOLIC PANEL
ALT: 24 U/L (ref 0–35)
AST: 28 U/L (ref 0–37)
Albumin: 4.4 g/dL (ref 3.5–5.2)
Alkaline Phosphatase: 84 U/L (ref 39–117)
BUN: 11 mg/dL (ref 6–23)
CO2: 29 mEq/L (ref 19–32)
Calcium: 9.3 mg/dL (ref 8.4–10.5)
Chloride: 103 mEq/L (ref 96–112)
Creatinine, Ser: 0.63 mg/dL (ref 0.40–1.20)
GFR: 89.73 mL/min (ref >60.00)
Glucose, Bld: 138 mg/dL — ABNORMAL HIGH (ref 70–99)
Potassium: 3.7 mEq/L (ref 3.5–5.1)
Sodium: 139 mEq/L (ref 135–145)
Total Bilirubin: 0.4 mg/dL (ref 0.2–1.2)
Total Protein: 7.6 g/dL (ref 6.0–8.3)

## 2021-11-28 LAB — TSH: TSH: 0.93 u[IU]/mL (ref 0.35–5.50)

## 2021-11-28 LAB — VITAMIN B12: Vitamin B-12: 672 pg/mL (ref 211–911)

## 2021-11-28 LAB — VITAMIN D 25 HYDROXY (VIT D DEFICIENCY, FRACTURES): VITD: 24.51 ng/mL — ABNORMAL LOW (ref 30.00–100.00)

## 2021-11-28 MED ORDER — MELOXICAM 7.5 MG PO TABS: 7.5000 mg | ORAL_TABLET | Freq: Every day | ORAL | 0 refills | Status: DC

## 2021-11-28 MED ORDER — TRAZODONE HCL 50 MG PO TABS: 25.0000 mg | ORAL_TABLET | Freq: Every evening | ORAL | 3 refills | Status: DC | PRN

## 2021-11-28 NOTE — Patient Instructions (Addendum)
Consider epidural Labs today Trazadone at night See me again in 2 months

## 2021-11-28 NOTE — Assessment & Plan Note (Signed)
Trazadone 50 mg at night to try

## 2021-11-28 NOTE — Assessment & Plan Note (Signed)
Stable overall at the moment.  Discussed icing regimen and home exercises.  Continuing to work on weight loss.  Follow-up again in 6 to 8 weeks

## 2021-11-28 NOTE — Assessment & Plan Note (Signed)
Patient does have degenerative disc disease.  Patient has discontinued alcohol for the last 4 months.  Would like her labs checked again to make sure that her liver enzymes are coming down.  Very hopeful that this will make a difference.  We discussed with patient to check with primary care if there is any other medications that could potentially help her with the weight loss.  Discussed icing regimen and home exercises for her other problems.  With the nerve impingement noted at the L4-L5 I do feel that an epidural would be helpful.  Patient still wants to avoid that at the moment.  Follow-up with me again in 6 to 8 weeks otherwise.  Total time with patient reviewing imaging and discussing with patient 34 minutes

## 2021-11-29 ENCOUNTER — Encounter: Payer: Self-pay | Admitting: Pharmacist

## 2021-11-29 NOTE — Progress Notes (Addendum)
[facility])                                            [place] Team                                        Statin Quality Measure Assessment    03/12/2022  SHONTAI VANDOVER 10-Nov-1950 [id number]  Dr. Sharlet Salina,   I am a [facility] clinical pharmacist that reviews patients for statin quality initiatives.     Per review of chart and payor information, Ms. Kitchens has a diagnosis of diabetes but is not currently filling a statin prescription.  This places her into the SUPD (Statin Use In Patients with Diabetes) measure for CMS.    I could not find any documentation of previous trial of a statin or a history of statin intolerance. If clinically appropriate, please consider evaluating Ms. Lever for statin therapy at her office visit on Tuesday, March 5th. Her calculated 10-year ASCVD risk score (Arnett DK, et al., 2019) is: 21.6%.  Based on her most recent A1C, diagnosis code R73.03 prediabetes may be applicable.   Please consider ONE of the following recommendations:  Initiate high intensity statin Atorvastatin '40mg'$  once daily, #90, 3 refills   Rosuvastatin '20mg'$  once daily, #90, 3 refills    Initiate moderate intensity          statin with reduced frequency if prior          statin intolerance 1x weekly, #13, 3 refills   2x weekly, #26, 3 refills   3x weekly, #39, 3 refills    Code for past statin intolerance or  other exclusions (required annually)  Provider Requirements: Associate code during an office visit or telehealth encounter  Drug Induced Myopathy G72.0   Myopathy, unspecified G72.9   Myositis, unspecified M60.9   Rhabdomyolysis [id number]   Alcoholic fatty liver 99991111   Cirrhosis of liver K74.69   Prediabetes R73.03   PCOS E28.2   Toxic liver disease, unspecified K71.9   Adverse effect of antihyperlipidemic and antiarteriosclerotic drugs, initial encounter [id number]   Thank you for your time and  consideration,  Curlene Labrum, PharmD [facility] Pharmacist Office: [phone]   Thank you!

## 2021-12-01 ENCOUNTER — Other Ambulatory Visit: Payer: Self-pay | Admitting: Family Medicine

## 2021-12-04 ENCOUNTER — Other Ambulatory Visit: Payer: Self-pay

## 2021-12-04 ENCOUNTER — Encounter: Payer: Self-pay | Admitting: Internal Medicine

## 2021-12-04 ENCOUNTER — Telehealth: Payer: Self-pay | Admitting: Internal Medicine

## 2021-12-04 ENCOUNTER — Ambulatory Visit (INDEPENDENT_AMBULATORY_CARE_PROVIDER_SITE_OTHER): Payer: Medicare HMO | Admitting: Internal Medicine

## 2021-12-04 VITALS — BP 128/82 | HR 89 | Temp 98.7°F | Ht 62.0 in | Wt 196.0 lb

## 2021-12-04 DIAGNOSIS — M5136 Other intervertebral disc degeneration, lumbar region: Secondary | ICD-10-CM | POA: Diagnosis not present

## 2021-12-04 DIAGNOSIS — J449 Chronic obstructive pulmonary disease, unspecified: Secondary | ICD-10-CM | POA: Diagnosis not present

## 2021-12-04 DIAGNOSIS — F1910 Other psychoactive substance abuse, uncomplicated: Secondary | ICD-10-CM | POA: Diagnosis not present

## 2021-12-04 DIAGNOSIS — M51369 Other intervertebral disc degeneration, lumbar region without mention of lumbar back pain or lower extremity pain: Secondary | ICD-10-CM

## 2021-12-04 DIAGNOSIS — E118 Type 2 diabetes mellitus with unspecified complications: Secondary | ICD-10-CM

## 2021-12-04 DIAGNOSIS — H9313 Tinnitus, bilateral: Secondary | ICD-10-CM | POA: Diagnosis not present

## 2021-12-04 LAB — POCT GLYCOSYLATED HEMOGLOBIN (HGB A1C): Hemoglobin A1C: 6.1 % — AB (ref 4.0–5.6)

## 2021-12-04 MED ORDER — TIRZEPATIDE 2.5 MG/0.5ML ~~LOC~~ SOAJ: 2.5000 mg | SUBCUTANEOUS | 2 refills | Status: DC

## 2021-12-04 MED ORDER — GABAPENTIN 300 MG PO CAPS: 300.0000 mg | ORAL_CAPSULE | Freq: Every day | ORAL | 3 refills | Status: DC

## 2021-12-04 NOTE — Assessment & Plan Note (Signed)
BMI 43.0 and complicated by hypertension, diabetes, hyperlipidemia, osteoarthritis. We are starting mounjaro for diabetes and weight loss. Counseled about diet/exercise.

## 2021-12-04 NOTE — Assessment & Plan Note (Signed)
Referral to audiology suspect hearing loss causing. Counseled.

## 2021-12-04 NOTE — Assessment & Plan Note (Signed)
Has stopped alcohol about 3 months ago. Encouragement given to not resume daily drinking. She is unsure if she wants to remain abstinent forever. She would like to socially drink. I have encouraged her to set limits if she is drinking to help her avoid prior habits with being unable to stay within certain limits on alcohol.

## 2021-12-04 NOTE — Progress Notes (Signed)
   Subjective:   Patient ID: Toni Hicks, female    DOB: 17-Jul-1950, 71 y.o.   MRN: [id number]  Headache  Associated symptoms include tinnitus. Pertinent negatives include no abdominal pain, coughing, nausea or vomiting.   The patient is a 71 YO female coming in for multiple concerns.  Review of Systems  Constitutional:  Positive for activity change.  HENT:  Positive for tinnitus.   Eyes: Negative.   Respiratory:  Positive for shortness of breath. Negative for cough and chest tightness.   Cardiovascular:  Negative for chest pain, palpitations and leg swelling.  Gastrointestinal:  Negative for abdominal distention, abdominal pain, constipation, diarrhea, nausea and vomiting.  Musculoskeletal: Negative.   Skin: Negative.   Neurological:  Positive for headaches.  Psychiatric/Behavioral:  Positive for sleep disturbance. The patient is not nervous/anxious.     Objective:  Physical Exam Constitutional:      Appearance: She is well-developed. She is obese.  HENT:     Head: Normocephalic and atraumatic.     Right Ear: Tympanic membrane normal.     Left Ear: Tympanic membrane normal.  Cardiovascular:     Rate and Rhythm: Normal rate and regular rhythm.  Pulmonary:     Effort: Pulmonary effort is normal. No respiratory distress.     Breath sounds: Normal breath sounds. No wheezing or rales.  Abdominal:     General: Bowel sounds are normal. There is no distension.     Palpations: Abdomen is soft.     Tenderness: There is no abdominal tenderness. There is no rebound.  Musculoskeletal:        General: Tenderness present.     Cervical back: Normal range of motion.  Skin:    General: Skin is warm and dry.  Neurological:     Mental Status: She is alert and oriented to person, place, and time.     Coordination: Coordination normal.     Vitals:   12/04/21 1055  BP: 128/82  Pulse: 89  Temp: 98.7 F (37.1 C)  TempSrc: Oral  SpO2: 96%  Weight: 196 lb (88.9 kg)  Height: '5\' 2"'$   (1.575 m)    Assessment & Plan:  Visit time 30 minutes in face to face communication with patient and coordination of care, additional 10 minutes spent in record review, coordination or care, ordering tests, communicating/referring to other healthcare professionals, documenting in medical records all on the same day of the visit for total time 40 minutes spent on the visit.

## 2021-12-04 NOTE — Assessment & Plan Note (Signed)
Rx mounjaro 2.5 mg weekly to help with weight loss. She did not tolerate rybelsus. POC Hga1c done today at 6.1. Stopping alcohol about 3 months ago has helped sugar control significantly.

## 2021-12-04 NOTE — Assessment & Plan Note (Signed)
This pain is keeping her from sleeping. Stop trazodone (not effective) and start gabapentin 300 mg qhs.

## 2021-12-04 NOTE — Telephone Encounter (Signed)
Today's prescriptions were sent to the wrong pharmacy. Pt request send: GABAPENTIN & MOUNJARO to:  [facility]  Per pt request. Pt ph [phone] if needed.

## 2021-12-04 NOTE — Patient Instructions (Addendum)
We will get you in with an audiologist.   The HgA1c is 6.1 which is good. If this is the same before next time we can change the diabetes to pre-diabetes.   We will try mounjaro for the weight gain to do weekly.   We have sent in gabapentin to use at night time to help with sleep.

## 2021-12-04 NOTE — Assessment & Plan Note (Signed)
Overall stable and some limitation of activity. She declines flu and pneumonia vaccine. Using trelegy daily and albuterol neb and inhaler as needed. Continue.

## 2021-12-04 NOTE — Telephone Encounter (Signed)
[facility] [address]

## 2021-12-06 ENCOUNTER — Other Ambulatory Visit: Payer: Self-pay | Admitting: Pharmacist

## 2021-12-06 DIAGNOSIS — J4540 Moderate persistent asthma, uncomplicated: Secondary | ICD-10-CM

## 2021-12-06 MED ORDER — DUPIXENT 300 MG/2ML ~~LOC~~ SOAJ: 300.0000 mg | SUBCUTANEOUS | 2 refills | Status: DC

## 2021-12-06 NOTE — Telephone Encounter (Signed)
Refill sent for [facility] to [facility]: [phone]  Dose: 300 mg SQ every 2 weeks  Last OV: 10/17/21 Provider: Dr. Annamaria Boots  Next OV: 11/20/2022  Knox Saliva, PharmD, MPH, BCPS Clinical Pharmacist (Rheumatology and Pulmonology)

## 2021-12-18 ENCOUNTER — Telehealth: Payer: Self-pay

## 2021-12-18 DIAGNOSIS — J4540 Moderate persistent asthma, uncomplicated: Secondary | ICD-10-CM

## 2021-12-18 NOTE — Telephone Encounter (Signed)
Contacted pt and discussed PAP re-enrollment for calendar year 2024. Per pt she has already received a letter in the mail stating that she is approved for next year, however she does not recall filling anything out. Advised that I would rather go ahead and submit it a second time rather than not at all. Informed pt that I would be placing renewal application in the mail and requested that they contact me directly if with any questions or concerns. Direct office number provided, will await f/u.

## 2021-12-21 ENCOUNTER — Telehealth: Payer: Self-pay | Admitting: Family Medicine

## 2021-12-22 ENCOUNTER — Telehealth: Payer: Self-pay | Admitting: Internal Medicine

## 2021-12-22 MED ORDER — PREDNISONE 10 MG PO TABS: ORAL_TABLET | ORAL | 0 refills | Status: AC

## 2021-12-22 MED ORDER — AZITHROMYCIN 250 MG PO TABS: ORAL_TABLET | ORAL | 0 refills | Status: DC

## 2021-12-22 NOTE — Telephone Encounter (Signed)
No appt avail for PT. Suffering from cold several days. O2 @ 86. Suggests she may need to be put back on Pred. Pls call. TY @ [phone]

## 2021-12-22 NOTE — Telephone Encounter (Signed)
Toni Hicks, Toni Hicks M2 hours ago (12:03 PM)   TO No appt avail for PT. Suffering from cold several days. O2 @ 86. Suggests she may need to be put back on Pred. Pls call. TY @ [phone]       Called and spoke with pt who states she has now been sick for 9 days. Pt said prior it was mainly a head cold but states today she started having some desats in her oxygen level satting around 86%.  Pt said she had been running a fever, last day being yesterday 12/14. States her temp got as high as 102.  Pt is coughing up yellow phlegm and states that she is wheezing some.  Pt states that she did take a covid test about 1 week ago which did come back negative. States that she has not taken one since. Pt said that her boyfriend developed a cold 9 days ago and now she has that cold that he had.  Pt said she has been using ibuprofen for fever and aches. Has been using chloraseptic throat spray to see if that would help calm her cough down.  Pt has had to use her rescue inhaler at  least once a day.   Pt is requesting to have meds called into the pharmacy, especially prednisone as she states that is usually what helps her feel like a normal person again.  Dr. Annamaria Boots, please advise on this.   Allergies  Allergen Reactions   Astelin [Azelastine] Other (See Comments)    Per patient caused psychiatric illness   Nasonex [Mometasone] Other (See Comments)    Per patient caused psychiatric illness   Other     Can not eat carbohydrates without protein, is allergic to certain foods but can take in certain doses    Current Outpatient Medications:    acetaminophen (TYLENOL) 500 MG tablet, Take 500 mg by mouth every 8 (eight) hours as needed. , Disp: , Rfl:    albuterol (PROVENTIL) (2.5 MG/3ML) 0.083% nebulizer solution, Take 3 mLs (2.5 mg total) by nebulization every 6 (six) hours as needed for wheezing or shortness of breath., Disp: 120 mL, Rfl: 5   albuterol (VENTOLIN HFA) 108 (90 Base) MCG/ACT inhaler,  INHALE 1-2 PUFFS BY MOUTH EVERY 6 HOURS AS NEEDED FOR WHEEZE OR SHORTNESS OF BREATH, Disp: 24 g, Rfl: 3   amLODipine (NORVASC) 5 MG tablet, TAKE 1 TABLET BY MOUTH DAILY, Disp: 90 tablet, Rfl: 3   b complex vitamins tablet, Take 1 tablet by mouth 3 (three) times a week., Disp: , Rfl:    Dupilumab (DUPIXENT) 300 MG/2ML SOPN, Inject 300 mg into the skin every 14 (fourteen) days., Disp: 12 mL, Rfl: 2   EPINEPHrine 0.3 mg/0.3 mL IJ SOAJ injection, Inject 0.3 mg into the muscle as needed for anaphylaxis., Disp: 1 each, Rfl: 2   fluocinonide cream (LIDEX) 8.91 %, Apply 1 application topically 2 (two) times daily as needed., Disp: 30 g, Rfl: 5   Fluticasone-Umeclidin-Vilant (TRELEGY ELLIPTA) 100-62.5-25 MCG/ACT AEPB, Inhale 1 puff then rinse mouth, once daily, Disp: 180 each, Rfl: 4   gabapentin (NEURONTIN) 300 MG capsule, Take 1 capsule (300 mg total) by mouth at bedtime., Disp: 30 capsule, Rfl: 3   hydrocortisone 2.5 % ointment, Apply topically 2 (two) times daily., Disp: 60 g, Rfl: 5   meloxicam (MOBIC) 7.5 MG tablet, Take 1 tablet (7.5 mg total) by mouth daily., Disp: 90 tablet, Rfl: 0   montelukast (SINGULAIR) 10 MG tablet, Take 1 tablet (10 mg  total) by mouth at bedtime., Disp: 90 tablet, Rfl: 3   tirzepatide (MOUNJARO) 2.5 MG/0.5ML Pen, Inject 2.5 mg into the skin once a week., Disp: 2 mL, Rfl: 2   traZODone (DESYREL) 50 MG tablet, TAKE 0.5-1 TABLETS BY MOUTH AT BEDTIME AS NEEDED FOR SLEEP., Disp: 90 tablet, Rfl: 2   Vitamin D, Ergocalciferol, (DRISDOL) 1.25 MG (50000 UNIT) CAPS capsule, TAKE 1 CAPSULE (50,000 UNITS TOTAL) BY MOUTH EVERY 7 (SEVEN) DAYS, Disp: 12 capsule, Rfl: 0

## 2021-12-22 NOTE — Telephone Encounter (Signed)
Closing encounter and creating a new one due to this being documented in PCP open encounter.

## 2021-12-22 NOTE — Telephone Encounter (Signed)
Please send Zpak 250 mg, # 6, 2 today then one daily                       Prednisone 10 mg, # 20, 4 X 2 DAYS, 3 X 2 DAYS, 2 X 2 DAYS, 1 X 2 DAYS   Thanks

## 2021-12-22 NOTE — Telephone Encounter (Signed)
Called and spoke with pt letting her know recs per CY. Pt verbalized understanding. Pred taper and zpak sent in for pt.   Pt is requesting to have a cough medication prescribed to see if it can help her sleep due to having problems overnight with the cough. Dr. Annamaria Boots, please advise.

## 2021-12-23 MED ORDER — BENZONATATE 200 MG PO CAPS: 200.0000 mg | ORAL_CAPSULE | Freq: Three times a day (TID) | ORAL | 1 refills | Status: DC | PRN

## 2021-12-23 NOTE — Telephone Encounter (Signed)
[facility]alon [facility]les [facility]

## 2021-12-25 ENCOUNTER — Ambulatory Visit: Payer: Medicare HMO | Admitting: Internal Medicine

## 2021-12-29 ENCOUNTER — Telehealth: Payer: Self-pay | Admitting: Internal Medicine

## 2021-12-29 NOTE — Telephone Encounter (Signed)
Called and spoke with patient. She stated that she just started her abx yesterday after it was sent in on 12/15, there was a mixup at the pharmacy. She will finish the prednisone taper tomorrow. She woke up this morning and her fever has returned. She has had a fever off and on for the past 3 weeks. She is also more fatigued and not feeling like herself. She last took a covid test earlier this week and it was negative. I encouraged her to take another test just to be on the safe side.  Denied any increased SOB or wheezing.    She is concerned since its the weekend and she is not feeling any better. I advised her that she needed to give the antibiotics more time to work.   She is still asking for recommendations.   RB, can you please advise since Dr. Annamaria Boots is not available? Thanks!

## 2021-12-29 NOTE — Telephone Encounter (Signed)
Called and spoke with patient. She verbalized understanding.   Nothing further needed at time of call.  

## 2021-12-29 NOTE — Telephone Encounter (Signed)
PT calling again. Please call back. Not feeling well at all. TY.

## 2021-12-29 NOTE — Telephone Encounter (Signed)
I agree that she needs to get on the abx and see how she responds. Then she needs to call us next week to check in, decide if we need to change anything. If she worsens then call over the weekend or go to [facility]

## 2021-12-29 NOTE — Telephone Encounter (Signed)
Left message for patient to call back  

## 2022-01-18 ENCOUNTER — Ambulatory Visit (INDEPENDENT_AMBULATORY_CARE_PROVIDER_SITE_OTHER): Payer: Medicare HMO

## 2022-01-18 VITALS — Ht 62.0 in | Wt 189.0 lb

## 2022-01-18 DIAGNOSIS — Z1239 Encounter for other screening for malignant neoplasm of breast: Secondary | ICD-10-CM

## 2022-01-18 DIAGNOSIS — Z0000 Encounter for general adult medical examination without abnormal findings: Secondary | ICD-10-CM

## 2022-01-18 NOTE — Patient Instructions (Signed)
Toni Hicks , Thank you for taking time to come for your Medicare Wellness Visit. I appreciate your ongoing commitment to your health goals. Please review the following plan we discussed and let me know if I can assist you in the future.   These are the goals we discussed:  Goals      LCSW called client phone number but was not able to speak with client. LCSW left phone message for client asking her to call LCSW at [phone].     Interventions: LCSW called client phone number today but was not able to speak via phone with client. LCSW left phone message for client asking her to call LCSW at [phone]     [facility] GOAL FOR 2024 IS TO GET MORE PHYSICAL ACTIVITY.        This is a list of the screening recommended for you and due dates:  Health Maintenance  Topic Date Due   Hepatitis C Screening: USPSTF Recommendation to screen - Ages 33-79 yo.  Never done   Zoster (Shingles) Vaccine (1 of 2) Never done   Colon Cancer Screening  Never done   Eye exam for diabetics  02/09/2015   Pneumonia Vaccine (1 - PCV) Never done   DEXA scan (bone density measurement)  Never done   Mammogram  02/15/2018   Flu Shot  Never done   COVID-19 Vaccine (6 - 2023-24 season) 09/08/2021   Yearly kidney health urinalysis for diabetes  01/16/2022   Complete foot exam   01/16/2022   Hemoglobin A1C  06/04/2022   Yearly kidney function blood test for diabetes  11/29/2022   Medicare Annual Wellness Visit  01/19/2023   DTaP/Tdap/Td vaccine (2 - Td or Tdap) 12/14/2025   HPV Vaccine  Aged Out    Advanced directives: YES  Conditions/risks identified: YES  Next appointment: Follow up in one year for your annual wellness visit.   Preventive Care 75 Years and Older, Female Preventive care refers to lifestyle choices and visits with your health care provider that can promote health and wellness. What does preventive care include? A yearly physical exam. This is also called an annual well check. Dental exams once or  twice a year. Routine eye exams. Ask your health care provider how often you should have your eyes checked. Personal lifestyle choices, including: Daily care of your teeth and gums. Regular physical activity. Eating a healthy diet. Avoiding tobacco and drug use. Limiting alcohol use. Practicing safe sex. Taking low-dose aspirin every day. Taking vitamin and mineral supplements as recommended by your health care provider. What happens during an annual well check? The services and screenings done by your health care provider during your annual well check will depend on your age, overall health, lifestyle risk factors, and family history of disease. Counseling  Your health care provider may ask you questions about your: Alcohol use. Tobacco use. Drug use. Emotional well-being. Home and relationship well-being. Sexual activity. Eating habits. History of falls. Memory and ability to understand (cognition). Work and work Statistician. Reproductive health. Screening  You may have the following tests or measurements: Height, weight, and BMI. Blood pressure. Lipid and cholesterol levels. These may be checked every 5 years, or more frequently if you are over 55 years old. Skin check. Lung cancer screening. You may have this screening every year starting at age 34 if you have a 30-pack-year history of smoking and currently smoke or have quit within the past 15 years. Fecal occult blood test (FOBT) of the stool. You  may have this test every year starting at age 22. Flexible sigmoidoscopy or colonoscopy. You may have a sigmoidoscopy every 5 years or a colonoscopy every 10 years starting at age 34. Hepatitis C blood test. Hepatitis B blood test. Sexually transmitted disease (STD) testing. Diabetes screening. This is done by checking your blood sugar (glucose) after you have not eaten for a while (fasting). You may have this done every 1-3 years. Bone density scan. This is done to screen for  osteoporosis. You may have this done starting at age 13. Mammogram. This may be done every 1-2 years. Talk to your health care provider about how often you should have regular mammograms. Talk with your health care provider about your test results, treatment options, and if necessary, the need for more tests. Vaccines  Your health care provider may recommend certain vaccines, such as: Influenza vaccine. This is recommended every year. Tetanus, diphtheria, and acellular pertussis (Tdap, Td) vaccine. You may need a Td booster every 10 years. Zoster vaccine. You may need this after age 61. Pneumococcal 13-valent conjugate (PCV13) vaccine. One dose is recommended after age 4. Pneumococcal polysaccharide (PPSV23) vaccine. One dose is recommended after age 68. Talk to your health care provider about which screenings and vaccines you need and how often you need them. This information is not intended to replace advice given to you by your health care provider. Make sure you discuss any questions you have with your health care provider. Document Released: 01/21/2015 Document Revised: 09/14/2015 Document Reviewed: 10/26/2014 Elsevier Interactive Patient Education  2017 [place] Prevention in the Home Falls can cause injuries. They can happen to people of all ages. There are many things you can do to make your home safe and to help prevent falls. What can I do on the outside of my home? Regularly fix the edges of walkways and driveways and fix any cracks. Remove anything that might make you trip as you walk through a door, such as a raised step or threshold. Trim any bushes or trees on the path to your home. Use bright outdoor lighting. Clear any walking paths of anything that might make someone trip, such as rocks or tools. Regularly check to see if handrails are loose or broken. Make sure that both sides of any steps have handrails. Any raised decks and porches should have guardrails on  the edges. Have any leaves, snow, or ice cleared regularly. Use sand or salt on walking paths during winter. Clean up any spills in your garage right away. This includes oil or grease spills. What can I do in the bathroom? Use night lights. Install grab bars by the toilet and in the tub and shower. Do not use towel bars as grab bars. Use non-skid mats or decals in the tub or shower. If you need to sit down in the shower, use a plastic, non-slip stool. Keep the floor dry. Clean up any water that spills on the floor as soon as it happens. Remove soap buildup in the tub or shower regularly. Attach bath mats securely with double-sided non-slip rug tape. Do not have throw rugs and other things on the floor that can make you trip. What can I do in the bedroom? Use night lights. Make sure that you have a light by your bed that is easy to reach. Do not use any sheets or blankets that are too big for your bed. They should not hang down onto the floor. Have a firm chair that has side  arms. You can use this for support while you get dressed. Do not have throw rugs and other things on the floor that can make you trip. What can I do in the kitchen? Clean up any spills right away. Avoid walking on wet floors. Keep items that you use a lot in easy-to-reach places. If you need to reach something above you, use a strong step stool that has a grab bar. Keep electrical cords out of the way. Do not use floor polish or wax that makes floors slippery. If you must use wax, use non-skid floor wax. Do not have throw rugs and other things on the floor that can make you trip. What can I do with my stairs? Do not leave any items on the stairs. Make sure that there are handrails on both sides of the stairs and use them. Fix handrails that are broken or loose. Make sure that handrails are as long as the stairways. Check any carpeting to make sure that it is firmly attached to the stairs. Fix any carpet that is loose  or worn. Avoid having throw rugs at the top or bottom of the stairs. If you do have throw rugs, attach them to the floor with carpet tape. Make sure that you have a light switch at the top of the stairs and the bottom of the stairs. If you do not have them, ask someone to add them for you. What else can I do to help prevent falls? Wear shoes that: Do not have high heels. Have rubber bottoms. Are comfortable and fit you well. Are closed at the toe. Do not wear sandals. If you use a stepladder: Make sure that it is fully opened. Do not climb a closed stepladder. Make sure that both sides of the stepladder are locked into place. Ask someone to hold it for you, if possible. Clearly mark and make sure that you can see: Any grab bars or handrails. First and last steps. Where the edge of each step is. Use tools that help you move around (mobility aids) if they are needed. These include: Canes. Walkers. Scooters. Crutches. Turn on the lights when you go into a dark area. Replace any light bulbs as soon as they burn out. Set up your furniture so you have a clear path. Avoid moving your furniture around. If any of your floors are uneven, fix them. If there are any pets around you, be aware of where they are. Review your medicines with your doctor. Some medicines can make you feel dizzy. This can increase your chance of falling. Ask your doctor what other things that you can do to help prevent falls. This information is not intended to replace advice given to you by your health care provider. Make sure you discuss any questions you have with your health care provider. Document Released: 10/21/2008 Document Revised: 06/02/2015 Document Reviewed: 01/29/2014 Elsevier Interactive Patient Education  2017 [employer].

## 2022-01-18 NOTE — Progress Notes (Signed)
Virtual Visit via Telephone Note  I connected with  Toni Hicks on 01/18/22 at 11:00 AM EST by telephone and verified that I am speaking with the correct person using two identifiers.  Location: Patient: Home Provider: [place] Persons participating in the virtual visit: [place]   I discussed the limitations, risks, security and privacy concerns of performing an evaluation and management service by telephone and the availability of in person appointments. The patient expressed understanding and agreed to proceed.  Interactive audio and video telecommunications were attempted between this nurse and patient, however failed, due to patient having technical difficulties OR patient did not have access to video capability.  We continued and completed visit with audio only.  Some vital signs may be absent or patient reported.   Toni Flow, LPN  Subjective:   Toni Hicks is a 72 y.o. female who presents for Medicare Annual (Subsequent) preventive examination.  Review of Systems     Cardiac Risk Factors include: advanced age (>48mn, >>52women);family history of premature cardiovascular disease;hypertension     Objective:    Today's Vitals   01/18/22 1103  Weight: 189 lb (85.7 kg)  Height: '5\' 2"'$  (1.575 m)  PainSc: 4   PainLoc: Knee   Body mass index is 34.57 kg/m.     01/18/2022   11:11 AM 07/29/2018   11:05 AM 07/25/2018    3:54 PM 03/05/2014    6:06 PM 03/05/2014   12:34 PM  Advanced Directives  Does Patient Have a Medical Advance Directive? Yes Yes Yes No No  Type of AParamedicof ABon AirLiving will [place] will [place] will    Does patient want to make changes to medical advance directive?  No - Patient declined No - Patient declined    Copy of HRustonin Chart? No - copy requested No - copy requested No - copy requested    Would  patient like information on creating a medical advance directive?    Yes - Educational materials given     Current Medications (verified) Outpatient Encounter Medications as of 01/18/2022  Medication Sig   acetaminophen (TYLENOL) 500 MG tablet Take 500 mg by mouth every 8 (eight) hours as needed.    albuterol (PROVENTIL) (2.5 MG/3ML) 0.083% nebulizer solution Take 3 mLs (2.5 mg total) by nebulization every 6 (six) hours as needed for wheezing or shortness of breath.   albuterol (VENTOLIN HFA) 108 (90 Base) MCG/ACT inhaler INHALE 1-2 PUFFS BY MOUTH EVERY 6 HOURS AS NEEDED FOR WHEEZE OR SHORTNESS OF BREATH   amLODipine (NORVASC) 5 MG tablet TAKE 1 TABLET BY MOUTH DAILY   azithromycin (ZITHROMAX) 250 MG tablet Take two today and then one daily until finished.   b complex vitamins tablet Take 1 tablet by mouth 3 (three) times a week.   benzonatate (TESSALON) 200 MG capsule Take 1 capsule (200 mg total) by mouth 3 (three) times daily as needed for cough.   Dupilumab (DUPIXENT) 300 MG/2ML SOPN Inject 300 mg into the skin every 14 (fourteen) days.   EPINEPHrine 0.3 mg/0.3 mL IJ SOAJ injection Inject 0.3 mg into the muscle as needed for anaphylaxis.   fluocinonide cream (LIDEX) 09.60% Apply 1 application topically 2 (two) times daily as needed.   Fluticasone-Umeclidin-Vilant (TRELEGY ELLIPTA) 100-62.5-25 MCG/ACT AEPB Inhale 1 puff then rinse mouth, once daily   gabapentin (NEURONTIN) 300 MG capsule Take 1 capsule (300 mg total) by mouth at  bedtime.   hydrocortisone 2.5 % ointment Apply topically 2 (two) times daily.   meloxicam (MOBIC) 7.5 MG tablet Take 1 tablet (7.5 mg total) by mouth daily.   montelukast (SINGULAIR) 10 MG tablet Take 1 tablet (10 mg total) by mouth at bedtime.   tirzepatide [facility]) 2.5 MG/0.5ML Pen Inject 2.5 mg into the skin once a week.   traZODone (DESYREL) 50 MG tablet TAKE 0.5-1 TABLETS BY MOUTH AT BEDTIME AS NEEDED FOR SLEEP.   Vitamin D, Ergocalciferol, (DRISDOL) 1.25 MG  (50000 UNIT) CAPS capsule TAKE 1 CAPSULE (50,000 UNITS TOTAL) BY MOUTH EVERY 7 (SEVEN) DAYS   No facility-administered encounter medications on file as of 01/18/2022.    Allergies (verified) Astelin [azelastine], Nasonex [mometasone], and Other   History: Past Medical History:  Diagnosis Date   ADD (attention deficit disorder)    Adult acne    Alcohol abuse    Allergic rhinitis    Anxiety and depression    Asthma    Congenital pneumonia    Eczema    Hypoglycemia    no longer per pt    Hypothyroidism    Impaired fasting blood sugar    Mycoplasma pneumonia    Restless leg syndrome    Seasonal allergies    Stroke ([place])    Urticaria    Past Surgical History:  Procedure Laterality Date   DILATION AND CURETTAGE OF UTERUS     @ 73 years old   TOTAL HIP ARTHROPLASTY Left 07/29/2018   Procedure: Left Anterior Hip Arthroplasty;  Surgeon: Melrose Nakayama, MD;  Location: [facility];  Service: Orthopedics;  Laterality: Left;   WISDOM TOOTH EXTRACTION     Family History  Problem Relation Age of Onset   COPD Father    Heart failure Father    Coronary artery disease Father    Cancer Mother        breast   Hypertension Mother    Dementia Mother    Stroke Mother    Breast cancer Mother    Social History   Socioeconomic History   Marital status: Widowed    Spouse name: has boyfriend Sam   Number of children: 2   Years of education: 17   Highest education level: Some college, no degree  Occupational History   Occupation: Clinical biochemist    Comment: Semi-retired  Tobacco Use   Smoking status: Former    Packs/day: 2.00    Years: 15.00    Total pack years: 30.00    Types: Cigarettes    Quit date: 1980    Years since quitting: 44.0   Smokeless tobacco: Never  Vaping Use   Vaping Use: Former  Substance and Sexual Activity   Alcohol use: Yes    Alcohol/week: 0.0 standard drinks of alcohol    Comment: 3-5 glasses of wine day    Drug use: No   Sexual activity: Not on file   Other Topics Concern   Not on file  Social History Narrative   HSG, [employer] MA- photography   Married '73- 10 years divorced, married '86- 45 years divorced, Married '96- 62 years- widowed.   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   Social Determinants of Health   Financial Resource Strain: Low Risk  (01/18/2022)   Overall Financial Resource Strain (CARDIA)    Difficulty of Paying Living Expenses: Not hard at all  Food Insecurity: No Food Insecurity (01/18/2022)   Hunger Vital  Sign    Worried About Charity fundraiser in the Last Year: Never true    [place] in the Last Year: Never true  Transportation Needs: No Transportation Needs (01/18/2022)   PRAPARE - Hydrologist (Medical): No    Lack of Transportation (Non-Medical): No  Physical Activity: Not on file  Stress: No Stress Concern Present (01/18/2022)   [place]    Feeling of Stress : Not at all  Social Connections: Moderately Integrated (01/18/2022)   Social Connection and Isolation Panel [NHANES]    Frequency of Communication with Friends and Family: More than three times a week    Frequency of Social Gatherings with Friends and Family: More than three times a week    Attends Religious Services: More than 4 times per year    Active Member of [employer] or Organizations: Yes    Attends Archivist Meetings: More than 4 times per year    Marital Status: Widowed    Tobacco Counseling Counseling given: Not Answered   Clinical Intake:  Pre-visit preparation completed: Yes  Pain : 0-10 Pain Score: 4  Pain Type: Chronic pain Pain Location: Knee Pain Orientation: Right     BMI - recorded: 34.57 Nutritional Status: BMI > 30  Obese Nutritional Risks: None Diabetes: No  How often do you need to have someone help you when you read  instructions, pamphlets, or other written materials from your doctor or pharmacy?: 1 - Never What is the last grade level you completed in school?: HSG  Diabetic? No  Interpreter Needed?: No  Information entered by :: Lisette Abu, LPN.   Activities of Daily Living    01/18/2022   11:17 AM  In your present state of health, do you have any difficulty performing the following activities:  Hearing? 1  Vision? 0  Difficulty concentrating or making decisions? 0  Walking or climbing stairs? 1  Dressing or bathing? 0  Doing errands, shopping? 0  Preparing Food and eating ? N  Using the Toilet? N  In the past six months, have you accidently leaked urine? N  Do you have problems with loss of bowel control? N  Managing your Medications? N  Managing your Finances? N  Housekeeping or managing your Housekeeping? N    Patient Care Team: Hoyt Koch, MD as PCP - General (Internal Medicine) Minus Breeding, MD as PCP - Cardiology (Cardiology)  Indicate any recent Medical Services you may have received from other than [facility] providers in the past year (date may be approximate).     Assessment:   This is a routine wellness examination for [place].  Hearing/Vision screen Hearing Screening - Comments:: Issues with hearing.  No hearing aids. Vision Screening - Comments:: Wears rx glasses - up to date with routine eye exams with [facility].   Dietary issues and exercise activities discussed: Current Exercise Habits: Home exercise routine, Type of exercise: walking;Other - see comments (swimming), Time (Minutes): 60, Frequency (Times/Week): 2, Weekly Exercise (Minutes/Week): 120, Intensity: Mild, Exercise limited by: orthopedic condition(s);respiratory conditions(s)   Goals Addressed   None   Depression Screen    01/18/2022   11:17 AM 12/04/2021   11:02 AM 02/24/2020    8:55 AM 02/11/2018    1:27 PM  PHQ 2/9 Scores  PHQ - 2 Score 0 0 0 1  PHQ- 9 Score 7 7       Fall Risk  12/04/2021   11:02 AM 02/24/2020    8:55 AM 02/11/2018    1:27 PM 01/21/2018   10:42 AM 11/26/2016    3:32 PM  Fall Risk   Falls in the past year? '1 1 1 1 '$ No  Number falls in past yr: 1 0 1 1   Injury with Fall? 1 0 0 0   Risk for fall due to : History of fall(s)   History of fall(s)   Follow up Falls evaluation completed   Falls evaluation completed;Education provided     FALL RISK PREVENTION PERTAINING TO THE HOME:  Any stairs in or around the home? Yes  If so, are there any without handrails? No  Home free of loose throw rugs in walkways, pet beds, electrical cords, etc? Yes  Adequate lighting in your home to reduce risk of falls? Yes   ASSISTIVE DEVICES UTILIZED TO PREVENT FALLS:  Life alert? No  Use of a cane, walker or w/c? No  Grab bars in the bathroom? Yes  Shower chair or bench in shower? No  Elevated toilet seat or a handicapped toilet? No   TIMED UP AND GO:  Was the test performed? No . Phone Visit  Cognitive Function:    05/14/2014    8:37 AM  MMSE - Mini Mental State Exam  Orientation to time 5  Orientation to Place 5  Registration 3  Attention/ Calculation 5  Recall 3  Language- name 2 objects 2  Language- repeat 1  Language- follow 3 step command 3  Language- read & follow direction 1  Write a sentence 1  Copy design 1  Total score 30        01/18/2022   11:16 AM  6CIT Screen  What Year? 0 points  What month? 0 points  What time? 0 points  Count back from 20 0 points  Months in reverse 0 points  Repeat phrase 0 points  Total Score 0 points    Immunizations Immunization History  Administered Date(s) Administered   Moderna Sars-Covid-2 Vaccination 04/25/2020   PFIZER(Purple Top)SARS-COV-2 Vaccination 02/22/2019, 03/17/2019, 11/05/2019   Pfizer Covid-19 Vaccine Bivalent Booster 34yr & up 10/25/2020   Tdap 12/15/2015   Zoster, Live 12/15/2015    TDAP status: Up to date  Flu Vaccine status: Due, Education has been  provided regarding the importance of this vaccine. Advised may receive this vaccine at local pharmacy or Health Dept. Aware to provide a copy of the vaccination record if obtained from local pharmacy or Health Dept. Verbalized acceptance and understanding.  Pneumococcal vaccine status: Due, Education has been provided regarding the importance of this vaccine. Advised may receive this vaccine at local pharmacy or Health Dept. Aware to provide a copy of the vaccination record if obtained from local pharmacy or Health Dept. Verbalized acceptance and understanding.  Covid-19 vaccine status: Completed vaccines  Qualifies for Shingles Vaccine? Yes   Zostavax completed Yes   Shingrix Completed?: No.    Education has been provided regarding the importance of this vaccine. Patient has been advised to call insurance company to determine out of pocket expense if they have not yet received this vaccine. Advised may also receive vaccine at local pharmacy or Health Dept. Verbalized acceptance and understanding.  Screening Tests Health Maintenance  Topic Date Due   Hepatitis C Screening  Never done   Zoster Vaccines- Shingrix (1 of 2) Never done   COLONOSCOPY (Pts 45-429yrInsurance coverage will need to be confirmed)  Never done   OPHTHALMOLOGY  EXAM  02/09/2015   Pneumonia Vaccine 72+ Years old (1 - PCV) Never done   DEXA SCAN  Never done   MAMMOGRAM  02/15/2018   INFLUENZA VACCINE  Never done   COVID-19 Vaccine (6 - 2023-24 season) 09/08/2021   Diabetic kidney evaluation - Urine ACR  01/16/2022   FOOT EXAM  01/16/2022   HEMOGLOBIN A1C  06/04/2022   Diabetic kidney evaluation - eGFR measurement  11/29/2022   Medicare Annual Wellness (AWV)  01/19/2023   DTaP/Tdap/Td (2 - Td or Tdap) 12/14/2025   HPV VACCINES  Aged Out    Health Maintenance  Health Maintenance Due  Topic Date Due   Hepatitis C Screening  Never done   Zoster Vaccines- Shingrix (1 of 2) Never done   COLONOSCOPY (Pts 45-52yr  Insurance coverage will need to be confirmed)  Never done   OPHTHALMOLOGY EXAM  02/09/2015   Pneumonia Vaccine 71 Years old (1 - PCV) Never done   DEXA SCAN  Never done   MAMMOGRAM  02/15/2018   INFLUENZA VACCINE  Never done   COVID-19 Vaccine (6 - 2023-24 season) 09/08/2021   Diabetic kidney evaluation - Urine ACR  01/16/2022   FOOT EXAM  01/16/2022    Colorectal cancer screening: never done  Mammogram screening: [facility]  Bone Density screening: never done   Lung Cancer Screening: (Low Dose CT Chest recommended if Age 71-80years, 30 pack-year currently smoking OR have quit w/in 15years.) does not qualify.   Lung Cancer Screening Referral: no  Additional Screening:  Hepatitis C Screening: does qualify; Completed: no  Vision Screening: Recommended annual ophthalmology exams for early detection of glaucoma and other disorders of the eye. Is the patient up to date with their annual eye exam?  Yes  Who is the provider or what is the name of the office in which the patient attends annual eye exams? [facility] If pt is not established with a provider, would they like to be referred to a provider to establish care? No .   Dental Screening: Recommended annual dental exams for proper oral hygiene  Community Resource Referral / Chronic Care Management: CRR required this visit?  No   CCM required this visit?  No      Plan:     I have personally reviewed and noted the following in the patient's chart:   Medical and social history Use of alcohol, tobacco or illicit drugs  Current medications and supplements including opioid prescriptions. Patient is not currently taking opioid prescriptions. Functional ability and status Nutritional status Physical activity Advanced directives List of other physicians Hospitalizations, surgeries, and ER visits in previous 12 months Vitals Screenings to include cognitive, depression, and  falls Referrals and appointments  In addition, I have reviewed and discussed with patient certain preventive protocols, quality metrics, and best practice recommendations. A written personalized care plan for preventive services as well as general preventive health recommendations were provided to patient.     SSheral Flow LPN   15/85/2778  Nurse Notes: N/A

## 2022-01-24 NOTE — Progress Notes (Signed)
[place] Phone: [phone] Subjective:   Toni Hicks, am serving as a scribe for Dr. Hulan Saas.  I'm seeing this patient by the request  of:  Hoyt Koch, MD  CC: right knee pain   [id number]  Toni Hicks is a 72 y.o. female coming in with complaint of R knee pain. R knee injection in August 2023. Patient states that she feels like it is time for another injection.   Considering epidural injection.  -     Past Medical History:  Diagnosis Date   ADD (attention deficit disorder)    Adult acne    Alcohol abuse    Allergic rhinitis    Anxiety and depression    Asthma    Congenital pneumonia    Eczema    Hypoglycemia    Hicks longer per pt    Hypothyroidism    Impaired fasting blood sugar    Mycoplasma pneumonia    Restless leg syndrome    Seasonal allergies    Stroke ([place])    Urticaria    Past Surgical History:  Procedure Laterality Date   DILATION AND CURETTAGE OF UTERUS     @ 72 years old   TOTAL HIP ARTHROPLASTY Left 07/29/2018   Procedure: Left Anterior Hip Arthroplasty;  Surgeon: Melrose Nakayama, MD;  Location: [facility];  Service: Orthopedics;  Laterality: Left;   WISDOM TOOTH EXTRACTION     Social History   Socioeconomic History   Marital status: Widowed    Spouse name: has boyfriend Sam   Number of children: 2   Years of education: 17   Highest education level: Some college, Hicks degree  Occupational History   Occupation: Clinical biochemist    Comment: Semi-retired  Tobacco Use   Smoking status: Former    Packs/day: 2.00    Years: 15.00    Total pack years: 30.00    Types: Cigarettes    Quit date: 1980    Years since quitting: 44.0   Smokeless tobacco: Never  Vaping Use   Vaping Use: Former  Substance and Sexual Activity   Alcohol use: Yes    Alcohol/week: 0.0 standard drinks of alcohol    Comment: 3-5 glasses of wine day    Drug use: Hicks   Sexual  activity: Not on file  Other Topics Concern   Not on file  Social History Narrative   HSG, [employer] MA- photography   Married '73- 10 years divorced, married '86- 70 years divorced, Married '96- 72 years- widowed.   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   Social Determinants of Health   Financial Resource Strain: Low Risk  (01/18/2022)   Overall Financial Resource Strain (CARDIA)    Difficulty of Paying Living Expenses: Not hard at all  Food Insecurity: Hicks Food Insecurity (01/18/2022)   Hunger Vital Sign    Worried About Running Out of Food in the Last Year: Never true    Ran Out of Food in the Last Year: Never true  Transportation Needs: Hicks Transportation Needs (01/18/2022)   PRAPARE - Hydrologist (Medical): Hicks    Lack of Transportation (Non-Medical): Hicks  Physical Activity: Not on file  Stress: Hicks Stress Concern Present (01/18/2022)   [place]    Feeling of Stress : Not at all  Social Connections: Moderately Integrated (01/18/2022)   Social Connection and Isolation Panel [NHANES]    Frequency of Communication with Friends and Family: More than three times a week    Frequency of Social Gatherings with Friends and Family: More than three times a week    Attends Religious Services: More than 4 times per year    Active Member of [employer] or Organizations: Yes    Attends Archivist Meetings: More than 4 times per year    Marital Status: Widowed   Allergies  Allergen Reactions   Astelin [Azelastine] Other (See Comments)    Per patient caused psychiatric illness   Nasonex [Mometasone] Other (See Comments)    Per patient caused psychiatric illness   Other     Can not eat carbohydrates without protein, is allergic to certain foods but can take in certain doses   Family History  Problem Relation Age of  Onset   COPD Father    Heart failure Father    Coronary artery disease Father    Cancer Mother        breast   Hypertension Mother    Dementia Mother    Stroke Mother    Breast cancer Mother     Current Outpatient Medications (Endocrine & Metabolic):    tirzepatide (MOUNJARO) 2.5 MG/0.5ML Pen, Inject 2.5 mg into the skin once a week.  Current Outpatient Medications (Cardiovascular):    amLODipine (NORVASC) 5 MG tablet, TAKE 1 TABLET BY MOUTH DAILY   EPINEPHrine 0.3 mg/0.3 mL IJ SOAJ injection, Inject 0.3 mg into the muscle as needed for anaphylaxis.  Current Outpatient Medications (Respiratory):    albuterol (PROVENTIL) (2.5 MG/3ML) 0.083% nebulizer solution, Take 3 mLs (2.5 mg total) by nebulization every 6 (six) hours as needed for wheezing or shortness of breath.   albuterol (VENTOLIN HFA) 108 (90 Base) MCG/ACT inhaler, INHALE 1-2 PUFFS BY MOUTH EVERY 6 HOURS AS NEEDED FOR WHEEZE OR SHORTNESS OF BREATH   benzonatate (TESSALON) 200 MG capsule, Take 1 capsule (200 mg total) by mouth 3 (three) times daily as needed for cough.   Fluticasone-Umeclidin-Vilant (TRELEGY ELLIPTA) 100-62.5-25 MCG/ACT AEPB, Inhale 1 puff then rinse mouth, once daily   montelukast (SINGULAIR) 10 MG tablet, Take 1 tablet (10 mg total) by mouth at bedtime.  Current Outpatient Medications (Analgesics):    acetaminophen (TYLENOL) 500 MG tablet, Take 500 mg by mouth every 8 (eight) hours as needed.    meloxicam (MOBIC) 7.5 MG tablet, Take 1 tablet (7.5 mg total) by mouth daily.   Current Outpatient Medications (Other):    azithromycin (ZITHROMAX) 250 MG tablet, Take two today and then one daily until finished.   b complex vitamins tablet, Take 1 tablet by mouth 3 (three) times a week.   Dupilumab (DUPIXENT) 300 MG/2ML SOPN, Inject 300 mg into the skin every 14 (fourteen) days.   fluocinonide cream (LIDEX) 9.37 %, Apply 1 application topically 2 (two) times daily as needed.   gabapentin (NEURONTIN) 300 MG  capsule, Take 1 capsule (300 mg total) by mouth at bedtime.   hydrocortisone 2.5 % ointment, Apply topically 2 (two) times daily.   traZODone (DESYREL) 50 MG tablet, TAKE 0.5-1 TABLETS BY MOUTH AT BEDTIME AS NEEDED FOR SLEEP.   Vitamin D, Ergocalciferol, (DRISDOL) 1.25 MG (50000 UNIT) CAPS capsule, TAKE 1 CAPSULE (50,000 UNITS TOTAL) BY MOUTH EVERY 7 (SEVEN) DAYS   Reviewed prior external information including notes and imaging from  primary care provider As well as notes that were available from  care everywhere and other healthcare systems.  Past medical history, social, surgical and family history all reviewed in electronic medical record.  Hicks pertanent information unless stated regarding to the chief complaint.   Review of Systems:  Hicks headache, visual changes, nausea, vomiting, diarrhea, constipation, dizziness, abdominal pain, skin rash, fevers, chills, night sweats, weight loss, swollen lymph nodes, body aches, joint swelling, chest pain, shortness of breath, mood changes. POSITIVE muscle aches  Objective  Blood pressure 138/78, pulse (!) 46, height '5\' 2"'$  (1.610 m), weight 195 lb (88.5 kg), SpO2 98 %.   General: Hicks apparent distress alert and oriented x3 mood and affect normal, dressed appropriately.  HEENT: Pupils equal, extraocular movements intact  Respiratory: Patient's speak in full sentences and does not appear short of breath  Cardiovascular: Hicks lower extremity edema, non tender, Hicks erythema  Knee exam shows Hicks changes noted.  Mild  instability noted.  Significant loss of lordosis of the lumbar spine also noted.  Tenderness noted over the paraspinal musculature. Tightness with straight leg test, patient does have limited extension of the back.  Some atrophy of the musculature of the legs noted  After informed written and verbal consent, patient was seated on exam table. Right knee was prepped with alcohol swab and utilizing anterolateral approach, patient's right knee space was  injected with 4:1  marcaine 0.5%: Kenalog '40mg'$ /dL. Patient tolerated the procedure well without immediate complications.   Impression and Recommendations:    The above documentation has been reviewed and is accurate and complete Lyndal Pulley, DO

## 2022-01-30 ENCOUNTER — Ambulatory Visit (INDEPENDENT_AMBULATORY_CARE_PROVIDER_SITE_OTHER): Payer: Medicare HMO | Admitting: Family Medicine

## 2022-01-30 VITALS — BP 138/78 | HR 46 | Ht 62.0 in | Wt 195.0 lb

## 2022-01-30 DIAGNOSIS — M170 Bilateral primary osteoarthritis of knee: Secondary | ICD-10-CM

## 2022-01-30 DIAGNOSIS — M5416 Radiculopathy, lumbar region: Secondary | ICD-10-CM

## 2022-01-30 MED ORDER — MELOXICAM 7.5 MG PO TABS: 7.5000 mg | ORAL_TABLET | Freq: Every day | ORAL | 0 refills | Status: DC

## 2022-01-30 NOTE — Patient Instructions (Addendum)
Epidural [id number] Meloxicam refilled See me in 3 months

## 2022-01-30 NOTE — Assessment & Plan Note (Signed)
Patient given injection and tolerated the procedure well, discussed icing regimen and home exercises, which activities to do and which ones to avoid, increase activity slowly.  Follow-up again in 6 to 8 weeks

## 2022-02-07 NOTE — Telephone Encounter (Signed)
Emailed [place] for update on patient's Dupixent PAP renewal  Knox Saliva, PharmD, MPH, BCPS, CPP Clinical Pharmacist (Rheumatology and Pulmonology)

## 2022-02-13 ENCOUNTER — Ambulatory Visit
Admission: RE | Admit: 2022-02-13 | Discharge: 2022-02-13 | Disposition: A | Discharge status: Home / Self Care | Payer: Medicare HMO | Source: Ambulatory Visit | Attending: Internal Medicine | Admitting: Internal Medicine

## 2022-02-13 ENCOUNTER — Ambulatory Visit: Payer: Medicare HMO

## 2022-02-13 DIAGNOSIS — Z1231 Encounter for screening mammogram for malignant neoplasm of breast: Secondary | ICD-10-CM | POA: Diagnosis not present

## 2022-02-13 DIAGNOSIS — Z1239 Encounter for other screening for malignant neoplasm of breast: Secondary | ICD-10-CM

## 2022-02-15 LAB — HM MAMMOGRAPHY

## 2022-02-16 ENCOUNTER — Encounter: Payer: Self-pay | Admitting: Internal Medicine

## 2022-02-24 ENCOUNTER — Other Ambulatory Visit: Payer: Self-pay | Admitting: Family Medicine

## 2022-03-13 ENCOUNTER — Encounter: Payer: Medicare HMO | Admitting: Internal Medicine

## 2022-03-20 ENCOUNTER — Encounter: Payer: Medicare HMO | Admitting: Internal Medicine

## 2022-03-21 NOTE — Telephone Encounter (Signed)
Received fax from [employer] stating patient has been dis-enrolled from [employer] patient assistance program since they have not received renewal form back from patient. ATC patient but unable to reach. Left VM advising her to call the program to complete re-enrollment for [employer]'s pt assistance program  Knox Saliva, PharmD, MPH, BCPS, CPP Clinical Pharmacist (Rheumatology and Pulmonology)

## 2022-04-03 ENCOUNTER — Other Ambulatory Visit: Payer: Self-pay | Admitting: Internal Medicine

## 2022-04-03 ENCOUNTER — Other Ambulatory Visit: Payer: Self-pay | Admitting: Family Medicine

## 2022-04-03 ENCOUNTER — Telehealth: Payer: Self-pay | Admitting: Internal Medicine

## 2022-04-03 DIAGNOSIS — J4540 Moderate persistent asthma, uncomplicated: Secondary | ICD-10-CM

## 2022-04-03 MED ORDER — ALBUTEROL SULFATE HFA 108 (90 BASE) MCG/ACT IN AERS: INHALATION_SPRAY | RESPIRATORY_TRACT | 3 refills | Status: DC

## 2022-04-03 NOTE — Telephone Encounter (Signed)
Pt. Calling and needs refills for albuterol (VENTOLIN HFA) 108 (90 Base) MCG/ACT

## 2022-04-03 NOTE — Telephone Encounter (Signed)
Spoke to pt informed her refill sent to pharmacy. Pt verbalized understanding. Nothing further needed.

## 2022-04-04 MED ORDER — DUPIXENT 300 MG/2ML ~~LOC~~ SOAJ: 300.0000 mg | SUBCUTANEOUS | 1 refills | Status: DC

## 2022-04-04 NOTE — Telephone Encounter (Signed)
Called [employer] PAP for patient assistance renewal status. Patient completed renewal form and they received yesterday, 04/03/22. Rep requested new rx for Dupixent. This has been escribed to [facility]. Rep has notated in her case review for pt assistance renewal. Will awiat f/u from [employer] PAP   Phone: [phone]  Knox Saliva, PharmD, MPH, BCPS, CPP Clinical Pharmacist (Rheumatology and Pulmonology)

## 2022-04-10 NOTE — Telephone Encounter (Signed)
Sent patient message to see if she was still using this medication.

## 2022-04-12 NOTE — Telephone Encounter (Signed)
Received a fax from  [facility] regarding an approval for [place] patient assistance from 04/11/22 to 01/08/23. Approval letter sent to scan center.  Phone #: [phone] Fax #: [phone]  Knox Saliva, PharmD, MPH, BCPS, CPP Clinical Pharmacist (Rheumatology and Pulmonology)

## 2022-04-28 ENCOUNTER — Other Ambulatory Visit: Payer: Self-pay | Admitting: Internal Medicine

## 2022-05-14 NOTE — Progress Notes (Unsigned)
[name] [address] [place] [address] Phone: [phone] Subjective:   INadine Counts, am serving as a scribe for Dr. Antoine Primas.  I'm seeing this patient by the request  of:  Myrlene Broker, MD  CC: Low back and bilateral knee pain   [id number]  01/30/2022 Patient given injection and tolerated the procedure well, discussed icing regimen and home exercises, which activities to do and which ones to avoid, increase activity slowly.  Follow-up again in 6 to 8 weeks      Update 05/15/2022 Toni Hicks is a 72 y.o. female coming in with complaint of lumbar spine and B knee pain. Did not get epidural. Patient states has been doing better. Have changed some at home supplements and is not taking painkillers anymore. Has had overall improvement in pain.      Past Medical History:  Diagnosis Date   ADD (attention deficit disorder)    Adult acne    Alcohol abuse    Allergic rhinitis    Anxiety and depression    Asthma    Congenital pneumonia    Eczema    Hypoglycemia    no longer per pt    Hypothyroidism    Impaired fasting blood sugar    Mycoplasma pneumonia    Restless leg syndrome    Seasonal allergies    Stroke (HCC)    Urticaria    Past Surgical History:  Procedure Laterality Date   DILATION AND CURETTAGE OF UTERUS     @ 72 years old   TOTAL HIP ARTHROPLASTY Left 07/29/2018   Procedure: Left Anterior Hip Arthroplasty;  Surgeon: Marcene Corning, MD;  Location: [facility];  Service: Orthopedics;  Laterality: Left;   WISDOM TOOTH EXTRACTION     Social History   Socioeconomic History   Marital status: Widowed    Spouse name: has boyfriend Sam   Number of children: 2   Years of education: 17   Highest education level: Some college, no degree  Occupational History   Occupation: Product/process development scientist    Comment: Semi-retired  Tobacco Use   Smoking status: Former    Packs/day: 2.00    Years: 15.00    Additional pack  years: 0.00    Total pack years: 30.00    Types: Cigarettes    Quit date: 1980    Years since quitting: 44.3   Smokeless tobacco: Never  Vaping Use   Vaping Use: Former  Substance and Sexual Activity   Alcohol use: Yes    Alcohol/week: 0.0 standard drinks of alcohol    Comment: 3-5 glasses of wine day    Drug use: No   Sexual activity: Not on file  Other Topics Concern   Not on file  Social History Narrative   HS[employer], [employer] MA- photography   Married '73- 10 years divorced, married '86- 10 years divorced, Married '96- 10 years- widowed.   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   Social Determinants of Corporate investment banker Strain: Low Risk  (01/18/2022)   Overall Financial Resource Strain (CARDIA)    Difficulty of Paying Living Expenses: Not hard at all  Food Insecurity: No Food Insecurity (01/18/2022)   Hunger Vital Sign    Worried About Running Out of Food in the Last Year: Never true    Ran Out of Food in the Last Year: Never true  Transportation Needs: No  Transportation Needs (01/18/2022)   PRAPARE - Administrator, Civil Service (Medical): No    Lack of Transportation (Non-Medical): No  Physical Activity: Not on file  Stress: No Stress Concern Present (01/18/2022)   [employer] of Occupational Health - Occupational Stress Questionnaire    Feeling of Stress : Not at all  Social Connections: Moderately Integrated (01/18/2022)   Social Connection and Isolation Panel [NHANES]    Frequency of Communication with Friends and Family: More than three times a week    Frequency of Social Gatherings with Friends and Family: More than three times a week    Attends Religious Services: More than 4 times per year    Active Member of [employer] or Organizations: Yes    Attends Banker Meetings: More than 4 times per year    Marital Status: Widowed   Allergies  Allergen  Reactions   Astelin [Azelastine] Other (See Comments)    Per patient caused psychiatric illness   Nasonex [Mometasone] Other (See Comments)    Per patient caused psychiatric illness   Other     Can not eat carbohydrates without protein, is allergic to certain foods but can take in certain doses   Family History  Problem Relation Age of Onset   COPD Father    Heart failure Father    Coronary artery disease Father    Cancer Mother        breast   Hypertension Mother    Dementia Mother    Stroke Mother    Breast cancer Mother     Current Outpatient Medications (Endocrine & Metabolic):    tirzepatide (MOUNJARO) 2.5 MG/0.5ML Pen, Inject 2.5 mg into the skin once a week. Follow-up appt is due in may must see MD for future refills  Current Outpatient Medications (Cardiovascular):    amLODipine (NORVASC) 5 MG tablet, TAKE 1 TABLET BY MOUTH EVERY DAY   EPINEPHrine 0.3 mg/0.3 mL IJ SOAJ injection, Inject 0.3 mg into the muscle as needed for anaphylaxis.  Current Outpatient Medications (Respiratory):    albuterol (PROVENTIL) (2.5 MG/3ML) 0.083% nebulizer solution, Take 3 mLs (2.5 mg total) by nebulization every 6 (six) hours as needed for wheezing or shortness of breath.   albuterol (VENTOLIN HFA) 108 (90 Base) MCG/ACT inhaler, INHALE 1-2 PUFFS BY MOUTH EVERY 6 HOURS AS NEEDED FOR WHEEZE OR SHORTNESS OF BREATH   benzonatate (TESSALON) 200 MG capsule, Take 1 capsule (200 mg total) by mouth 3 (three) times daily as needed for cough.   Fluticasone-Umeclidin-Vilant (TRELEGY ELLIPTA) 100-62.5-25 MCG/ACT AEPB, Inhale 1 puff then rinse mouth, once daily   montelukast (SINGULAIR) 10 MG tablet, Take 1 tablet (10 mg total) by mouth at bedtime.  Current Outpatient Medications (Analgesics):    acetaminophen (TYLENOL) 500 MG tablet, Take 500 mg by mouth every 8 (eight) hours as needed.    meloxicam (MOBIC) 7.5 MG tablet, TAKE 1 TABLET BY MOUTH EVERY DAY   Current Outpatient Medications (Other):     azithromycin (ZITHROMAX) 250 MG tablet, Take two today and then one daily until finished.   b complex vitamins tablet, Take 1 tablet by mouth 3 (three) times a week.   Dupilumab (DUPIXENT) 300 MG/2ML SOPN, Inject 300 mg into the skin every 14 (fourteen) days.   fluocinonide cream (LIDEX) 0.05 %, Apply 1 application topically 2 (two) times daily as needed.   gabapentin (NEURONTIN) 300 MG capsule, Take 1 capsule (300 mg total) by mouth at bedtime.   hydrocortisone 2.5 % ointment, Apply  topically 2 (two) times daily.   traZODone (DESYREL) 50 MG tablet, TAKE 0.5-1 TABLETS BY MOUTH AT BEDTIME AS NEEDED FOR SLEEP.   Vitamin D, Ergocalciferol, (DRISDOL) 1.25 MG (50000 UNIT) CAPS capsule, TAKE 1 CAPSULE (50,000 UNITS TOTAL) BY MOUTH EVERY 7 (SEVEN) DAYS   Reviewed prior external information including notes and imaging from  primary care provider As well as notes that were available from care everywhere and other healthcare systems.  Past medical history, social, surgical and family history all reviewed in electronic medical record.  No pertanent information unless stated regarding to the chief complaint.   Review of Systems:  No headache, visual changes, nausea, vomiting, diarrhea, constipation, dizziness, abdominal pain, skin rash, fevers, chills, night sweats, weight loss, swollen lymph nodes, body aches, joint swelling, chest pain, shortness of breath, mood changes. POSITIVE muscle aches  Objective  Blood pressure 126/78, pulse 83, height 5\' 2"  (1.575 m), weight 195 lb (88.5 kg), SpO2 92 %.   General: No apparent distress alert and oriented x3 mood and affect normal, dressed appropriately.  HEENT: Pupils equal, extraocular movements intact  Respiratory: Patient's speak in full sentences and does not appear short of breath  Cardiovascular: No lower extremity edema, non tender, no erythema  Effusion of the knees bilaterally noted.  Does have an antalgic gait noted.  Still has some loss of lordosis  of the back.    Impression and Recommendations:     The above documentation has been reviewed and is accurate and complete Judi Saa, DO

## 2022-05-15 ENCOUNTER — Encounter: Payer: Self-pay | Admitting: Family Medicine

## 2022-05-15 ENCOUNTER — Ambulatory Visit: Payer: Medicare HMO | Admitting: Family Medicine

## 2022-05-15 VITALS — BP 126/78 | HR 83 | Ht 62.0 in | Wt 195.0 lb

## 2022-05-15 DIAGNOSIS — M170 Bilateral primary osteoarthritis of knee: Secondary | ICD-10-CM

## 2022-05-15 DIAGNOSIS — F1910 Other psychoactive substance abuse, uncomplicated: Secondary | ICD-10-CM

## 2022-05-15 NOTE — Patient Instructions (Signed)
So glad you're doing great Wish you the best with Toni Hicks See you again in 3 months

## 2022-05-15 NOTE — Assessment & Plan Note (Signed)
Discussed with patient again at great length.  Doing well and would like to hold on injections at this time.  Discussed continuing repeat again in 3 months if needed.  Continue to work on weight loss.  Total time with patient today 32 minutes discussing different treatment options and comorbidities.

## 2022-05-15 NOTE — Assessment & Plan Note (Signed)
Patient is doing better but still not perfect.  Patient is having 1 drink at night at the moment.  Feels like she has been under control.  Feels like she has a good support system around her.  No dysuria.  She denies anything else.

## 2022-05-16 ENCOUNTER — Encounter: Payer: Self-pay | Admitting: Neurology

## 2022-05-16 ENCOUNTER — Ambulatory Visit: Payer: Medicare HMO | Admitting: Neurology

## 2022-05-16 VITALS — BP 133/76 | HR 75 | Ht 61.0 in | Wt 192.0 lb

## 2022-05-16 DIAGNOSIS — F5104 Psychophysiologic insomnia: Secondary | ICD-10-CM

## 2022-05-16 DIAGNOSIS — J4489 Other specified chronic obstructive pulmonary disease: Secondary | ICD-10-CM | POA: Diagnosis not present

## 2022-05-16 DIAGNOSIS — J301 Allergic rhinitis due to pollen: Secondary | ICD-10-CM | POA: Diagnosis not present

## 2022-05-16 DIAGNOSIS — E66813 Obesity, class 3: Secondary | ICD-10-CM | POA: Insufficient documentation

## 2022-05-16 DIAGNOSIS — Z9989 Dependence on other enabling machines and devices: Secondary | ICD-10-CM | POA: Insufficient documentation

## 2022-05-16 MED ORDER — TRAZODONE HCL 50 MG PO TABS: ORAL_TABLET | ORAL | 2 refills | Status: DC

## 2022-05-16 NOTE — Patient Instructions (Signed)
CPAP CPAP and BIPAP Information CPAP and BIPAP are methods that use air pressure to keep your airways open and to help you breathe well. CPAP and BIPAP use different amounts of pressure. Your health care provider will tell you whether CPAP or BIPAP would be more helpful for you. CPAP stands for "continuous positive airway pressure." With CPAP, the amount of pressure stays the same while you breathe in (inhale) and out (exhale). BIPAP stands for "bi-level positive airway pressure." With BIPAP, the amount of pressure will be higher when you inhale and lower when you exhale. This allows you to take larger breaths. CPAP or BIPAP may be used in the hospital, or your health care provider may want you to use it at home. You may need to have a sleep study before your health care provider can order a machine for you to use at home. What are the advantages? CPAP or BIPAP can be helpful if you have: Sleep apnea. Chronic obstructive pulmonary disease (COPD). Heart failure. Medical conditions that cause muscle weakness, including muscular dystrophy or amyotrophic lateral sclerosis (ALS). Other problems that cause breathing to be shallow, weak, abnormal, or difficult. CPAP and BIPAP are most commonly used for obstructive sleep apnea (OSA) to keep the airways from collapsing when the muscles relax during sleep. What are the risks? Generally, this is a safe treatment. However, problems may occur, including: Irritated skin or skin sores if the mask does not fit properly. Dry or stuffy nose or nosebleeds. Dry mouth. Feeling gassy or bloated. Sinus or lung infection if the equipment is not cleaned properly. When should CPAP or BIPAP be used? In most cases, the mask only needs to be worn during sleep. Generally, the mask needs to be worn throughout the night and during any daytime naps. People with certain medical conditions may also need to wear the mask at other times, such as when they are awake. Follow  instructions from your health care provider about when to use the machine. What happens during CPAP or BIPAP?  Both CPAP and BIPAP are provided by a small machine with a flexible plastic tube that attaches to a plastic mask that you wear. Air is blown through the mask into your nose or mouth. The amount of pressure that is used to blow the air can be adjusted on the machine. Your health care provider will set the pressure setting and help you find the best mask for you. Tips for using the mask Because the mask needs to be snug, some people feel trapped or closed-in (claustrophobic) when first using the mask. If you feel this way, you may need to get used to the mask. One way to do this is to hold the mask loosely over your nose or mouth and then gradually apply the mask more snugly. You can also gradually increase the amount of time that you use the mask. Masks are available in various types and sizes. If your mask does not fit well, talk with your health care provider about getting a different one. Some common types of masks include: Full face masks, which fit over the mouth and nose. Nasal masks, which fit over the nose. Nasal pillow or prong masks, which fit into the nostrils. If you are using a mask that fits over your nose and you tend to breathe through your mouth, a chin strap may be applied to help keep your mouth closed. Use a skin barrier to protect your skin as told by your health care provider. Some   CPAP and BIPAP machines have alarms that may sound if the mask comes off or develops a leak. If you have trouble with the mask, it is very important that you talk with your health care provider about finding a way to make the mask easier to tolerate. Do not stop using the mask. There could be a negative impact on your health if you stop using the mask. Tips for using the machine Place your CPAP or BIPAP machine on a secure table or stand near an electrical outlet. Know where the on/off switch  is on the machine. Follow instructions from your health care provider about how to set the pressure on your machine and when you should use it. Do not eat or drink while the CPAP or BIPAP machine is on. Food or fluids could get pushed into your lungs by the pressure of the CPAP or BIPAP. For home use, CPAP and BIPAP machines can be rented or purchased through home health care companies. Many different brands of machines are available. Renting a machine before purchasing may help you find out which particular machine works well for you. Your health insurance company may also decide which machine you may get. Keep the CPAP or BIPAP machine and attachments clean. Ask your health care provider for specific instructions. Check the humidifier if you have a dry stuffy nose or nosebleeds. Make sure it is working correctly. Follow these instructions at home: Take over-the-counter and prescription medicines only as told by your health care provider. Ask if you can take sinus medicine if your sinuses are blocked. Do not use any products that contain nicotine or tobacco. These products include cigarettes, chewing tobacco, and vaping devices, such as e-cigarettes. If you need help quitting, ask your health care provider. Keep all follow-up visits. This is important. Contact a health care provider if: You have redness or pressure sores on your head, face, mouth, or nose from the mask or head gear. You have trouble using the CPAP or BIPAP machine. You cannot tolerate wearing the CPAP or BIPAP mask. Someone tells you that you snore even when wearing your CPAP or BIPAP. Get help right away if: You have trouble breathing. You feel confused. Summary CPAP and BIPAP are methods that use air pressure to keep your airways open and to help you breathe well. If you have trouble with the mask, it is very important that you talk with your health care provider about finding a way to make the mask easier to tolerate. Do not  stop using the mask. There could be a negative impact to your health if you stop using the mask. Follow instructions from your health care provider about when to use the machine. This information is not intended to replace advice given to you by your health care provider. Make sure you discuss any questions you have with your health care provider. Document Revised: 08/03/2020 Document Reviewed: 12/04/2019 Elsevier Patient Education  2023 [employer]  

## 2022-05-16 NOTE — Progress Notes (Signed)
SLEEP MEDICINE CLINIC    Provider:  Melvyn Novas, MD  Primary Care Physician:  Myrlene Broker, MD [address] [place] [address]     Referring Provider: Naomie Dean, MD and Levert Feinstein, MD at [facility]   Referring Provider:  Myrlene Broker, Md [address] [place],  [place] [address]          Chief Complaint according to patient   Patient presents with:     New Patient (Initial Visit)      Previously established CPAP patient now needs a new CPAP.  She was last seen in 2020.       HISTORY OF PRESENT ILLNESS:  Toni Hicks is a 72 y.o. female patient who is seen upon referral on 05/16/2022 from PCP for a new CPAP, has insomnia and memory loss.     Toni Hicks 05/16/22  Chief concern according to patient :  " The patient reports that her CPAP is now 72 years old she had a sleep study with [place] on April 08, 2017 followed by a CPAP titration on 5-7 2019 and at that time her CPAP was ordered so it is indeed now 72 years old.  She had a follow-up visit with me in 2024 obstructive sleep apnea on CPAP and that was the last time we met.  She also had some insomnia which has persisted after the apnea was treated and it was not unexpected but insomnia would be unrelated to sleep apnea condition.  She has reduced or discontinued alcohol intake and I had expected that this would help her to gain a higher quality sleep but it has not on the contrary she still struggles with insomnia also probably not for organic reasons and she has gained more weight. Sleep relevant medical history: Insomnia, weight gain, allergic coughing, non restorative sleep.   Social history has changed and so far that she has been successful on preventing COPD exacerbations, she also has curb her alcohol intake, she has still insomnia as she stated her Epworth Sleepiness Scale was endorsed at 6 points, the fatigue severity score today was endorsed at 58 point and she endorsed 6 endorsed 6 points on  the geriatric depression score.  She has used her machine with high compliance 100% this is a timeframe between 7 April and 14 May 2022 30 days every day she has used the machine every day over 4 hours with an average of 9 hours 10 minutes.  The CPAP is set at 9 cm water pressure and it is an AirSense 10 AutoSet.  Her residual AHI is 2.7/h and her 95th percentile air leak is 7.4 L/min that is not high air leak.  She does not use an expiratory relief.  She does not use water in the humidifier chamber.       Toni Hicks is a 72 y.o. female patient of Dr. Okey Dupre, and is seen by Naomie Dean, MD at [facility]. Her PSG from 04/08/17 showed the total APNEA/HYPOPNEA INDEX (AHI) was 11.25/hour and the REM AHI was 63.3/h, all sleep in non-supine with the lowest oxygen saturation at 53%. Total Time spent below 89% 02 saturation equaled 29 minutes. She snores and wakes herself up snoring, she struggles to go back to sleep, she snorts, and breathing irregularities when sleeping. She has moments of complete confusion. She has memory loss. She has untreated depression and anxiety.   She took slowly to accept her CPAP machine,  also admitted  that she sleeps better with it, she likes to keep the covers of her bed over her face which allows her to sleep. Her compliance report looks excellent 100% compliance for the last 30 days with the final date of 20 July 2018.  Average of 7 hours 24 minutes, CPAP is set at 9 cmH2O pressure was 1 cm expiratory pressure today.  AHI is 0.6/h.  No central apneas emerging at this time.  Moderate air leakage. She still goes to the  bathroom every 1-2 hours at night. She still uses a fit bit-  She is approaching 6 hours of nightly sleep, but still reports insomnia.  She is having a hip surgery next week. Gabapentin was prescribed by her orthopedist and she feels " it messes her up". She had seen cognitive behaviour therapy- 2 times a month with [facility]. She is wanting to continue on zoom.         She was last seen on 09-23-2017 in a RV.  She was seen here as in a referral after she suffered a stroke, and has seen Dr Terrace Arabia and later Dr. Lucia Gaskins, who iniated this referral. [name] had suffered a stroke but not within the last 6 months.  She has originally seen pulmonologist Dr. Maple Hudson, and then most recently Dr. Naomie Dean.      RV 01-21-2018,  I will today reviewed the changing sleep habits with Toni Hicks. Was, meanwhile 72 year old Caucasian female who has stated that she has improved her sleep hygiene.  She soaks in a jacuzzi before she goes to bed, And she will be able to initiate sleep but she still wakes up earlier when she desires and sometimes nocturnal sleep may end at 1-2 AM more. She feels anxious when awake, was asked by her therapist read and do boring things. She has adjusted to CPAP and she feels her sleep improved, her AHi is 1.6, using it for 5 hours on average. 9 cm water with 1 cm EPR. 77% compliance by time, 93% by days. She sleeps well in a king size bed.     I had the pleasure of seeing Toni Hicks here today on 16 September in a revisit after she underwent a split-night polysomnography. This took place on 08 April 2017, referral by Dr. Naomie Dean.  She had very mild REM dependent obstructive sleep apnea with an AHI under 11/h but during REM sleep her apnea accentuated to 63/h of sleep.   Hypoxemia was borderline there were just under 30 minutes of total time and hypoxia measured, but it amounted to almost 20% given her brief sleep time overall.  We had recommended changes in sleep hygiene which were discussed at the last visit with Toni Hicks, and a follow-up for an attended CPAP titration whole night.  Also discussed with the avoidance of caffeine-containing beverages, alcohol and nicotine, and sleep psychology-cognitive behavioral referral.  The CPAP titration was then repeated on 14 May 2017 and begun at 5 cm water pressure which was step-by-step increased to  9 cm water pressure.  I met 9 cm CPAP there was no longer any apnea noted, she used a DreamWear nasal pillow and small size.  There was no REM sleep rebounding at 21.6% of the total sleep architecture, EKG was a normal sinus rhythm, there were some periodic limb movements and some of them woke the patient.  This time at 7.6/h.  I ordered an auto titration capable device.     Review of Systems: Out of a  complete 14 system review, the patient complains of only the following symptoms, and all other reviewed systems are negative.:   Insomnia, fragmented sleep ,  nocturia,    How likely are you to doze in the following situations: 0 = not likely, 1 = slight chance, 2 = moderate chance, 3 = high chance   Sitting and Reading? Watching Television? Sitting inactive in a public place (theater or meeting)? As a passenger in a car for an hour without a break? Lying down in the afternoon when circumstances permit? Sitting and talking to someone? Sitting quietly after lunch without alcohol? In a car, while stopped for a few minutes in traffic?   Total = 6/ 24 points   FSS endorsed at 58/ 63 points.   Brain fog.   Social History   Socioeconomic History   Marital status: Widowed    Spouse name: has boyfriend Sam   Number of children: 2   Years of education: 17   Highest education level: Some college, no degree  Occupational History   Occupation: Product/process development scientist    Comment: Semi-retired  Tobacco Use   Smoking status: Former    Packs/day: 2.00    Years: 15.00    Additional pack years: 0.00    Total pack years: 30.00    Types: Cigarettes    Quit date: 1980    Years since quitting: 44.3   Smokeless tobacco: Never  Vaping Use   Vaping Use: Former  Substance and Sexual Activity   Alcohol use: Yes    Alcohol/week: 0.0 standard drinks of alcohol    Comment: 3-5 glasses of wine day    Drug use: No   Sexual activity: Not on file  Other Topics Concern   Not on file  Social History  Narrative   HS[employer], [employer] MA- photography   Married '73- 10 years divorced, married '86- 10 years divorced, Married '96- 10 years- widowed.   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   Social Determinants of Health   Financial Resource Strain: Low Risk  (01/18/2022)   Overall Financial Resource Strain (CARDIA)    Difficulty of Paying Living Expenses: Not hard at all  Food Insecurity: No Food Insecurity (01/18/2022)   Hunger Vital Sign    Worried About Running Out of Food in the Last Year: Never true    Ran Out of Food in the Last Year: Never true  Transportation Needs: No Transportation Needs (01/18/2022)   PRAPARE - Administrator, Civil Service (Medical): No    Lack of Transportation (Non-Medical): No  Physical Activity: Not on file  Stress: No Stress Concern Present (01/18/2022)   Toni Hicks of Occupational Health - Occupational Stress Questionnaire    Feeling of Stress : Not at all  Social Connections: Moderately Integrated (01/18/2022)   Social Connection and Isolation Panel [NHANES]    Frequency of Communication with Friends and Family: More than three times a week    Frequency of Social Gatherings with Friends and Family: More than three times a week    Attends Religious Services: More than 4 times per year    Active Member of [employer] or Organizations: Yes    Attends Banker Meetings: More than 4 times per year    Marital Status: Widowed    Family History  Problem Relation Age of Onset   COPD Father    Heart failure Father    Coronary  artery disease Father    Cancer Mother        breast   Hypertension Mother    Dementia Mother    Stroke Mother    Breast cancer Mother     Past Medical History:  Diagnosis Date   ADD (attention deficit disorder)    Adult acne    Alcohol abuse    Allergic rhinitis    Anxiety and depression    Asthma    Congenital pneumonia     Eczema    Hypoglycemia    no longer per pt    Hypothyroidism    Impaired fasting blood sugar    Mycoplasma pneumonia    Restless leg syndrome    Seasonal allergies    Stroke (HCC)    Urticaria     Past Surgical History:  Procedure Laterality Date   DILATION AND CURETTAGE OF UTERUS     @ 72 years old   TOTAL HIP ARTHROPLASTY Left 07/29/2018   Procedure: Left Anterior Hip Arthroplasty;  Surgeon: Marcene Corning, MD;  Location: [facility];  Service: Orthopedics;  Laterality: Left;   WISDOM TOOTH EXTRACTION       Current Outpatient Medications on File Prior to Visit  Medication Sig Dispense Refill   acetaminophen (TYLENOL) 500 MG tablet Take 500 mg by mouth every 8 (eight) hours as needed.      albuterol (VENTOLIN HFA) 108 (90 Base) MCG/ACT inhaler INHALE 1-2 PUFFS BY MOUTH EVERY 6 HOURS AS NEEDED FOR WHEEZE OR SHORTNESS OF BREATH 24 g 3   amLODipine (NORVASC) 5 MG tablet TAKE 1 TABLET BY MOUTH EVERY DAY 90 tablet 3   azithromycin (ZITHROMAX) 250 MG tablet Take two today and then one daily until finished. 6 tablet 0   b complex vitamins tablet Take 1 tablet by mouth 3 (three) times a week.     benzonatate (TESSALON) 200 MG capsule Take 1 capsule (200 mg total) by mouth 3 (three) times daily as needed for cough. 30 capsule 1   Dupilumab (DUPIXENT) 300 MG/2ML SOPN Inject 300 mg into the skin every 14 (fourteen) days. 12 mL 1   EPINEPHrine 0.3 mg/0.3 mL IJ SOAJ injection Inject 0.3 mg into the muscle as needed for anaphylaxis. 1 each 2   fluocinonide cream (LIDEX) 0.05 % Apply 1 application topically 2 (two) times daily as needed. 30 g 5   Fluticasone-Umeclidin-Vilant (TRELEGY ELLIPTA) 100-62.5-25 MCG/ACT AEPB Inhale 1 puff then rinse mouth, once daily 180 each 4   hydrocortisone 2.5 % ointment Apply topically 2 (two) times daily. 60 g 5   meloxicam (MOBIC) 7.5 MG tablet TAKE 1 TABLET BY MOUTH EVERY DAY 90 tablet 1   montelukast (SINGULAIR) 10 MG tablet Take 1 tablet (10 mg total) by mouth at  bedtime. 90 tablet 3   tirzepatide (MOUNJARO) 2.5 MG/0.5ML Pen Inject 2.5 mg into the skin once a week. Follow-up appt is due in may must see MD for future refills 2 mL 0   traZODone (DESYREL) 50 MG tablet TAKE 0.5-1 TABLETS BY MOUTH AT BEDTIME AS NEEDED FOR SLEEP. 90 tablet 2   Vitamin D, Ergocalciferol, (DRISDOL) 1.25 MG (50000 UNIT) CAPS capsule TAKE 1 CAPSULE (50,000 UNITS TOTAL) BY MOUTH EVERY 7 (SEVEN) DAYS 12 capsule 0   No current facility-administered medications on file prior to visit.    Allergies  Allergen Reactions   Astelin [Azelastine] Other (See Comments)    Per patient caused psychiatric illness   Nasonex [Mometasone] Other (See Comments)    Per  patient caused psychiatric illness   Other     Can not eat carbohydrates without protein, is allergic to certain foods but can take in certain doses     DIAGNOSTIC DATA (LABS, IMAGING, TESTING) - I reviewed patient records, labs, notes, testing and imaging myself where available.  Lab Results  Component Value Date   WBC 6.1 01/16/2021   HGB 15.6 (H) 01/16/2021   HCT 46.7 (H) 01/16/2021   MCV 92.6 01/16/2021   PLT 232.0 01/16/2021      Component Value Date/Time   NA 139 11/28/2021 1417   NA 140 11/28/2017 1037   K 3.7 11/28/2021 1417   CL 103 11/28/2021 1417   CO2 29 11/28/2021 1417   GLUCOSE 138 (H) 11/28/2021 1417   BUN 11 11/28/2021 1417   BUN 10 11/28/2017 1037   CREATININE 0.63 11/28/2021 1417   CREATININE 0.65 08/06/2019 1054   CALCIUM 9.3 11/28/2021 1417   PROT 7.6 11/28/2021 1417   PROT 7.4 11/28/2017 1037   ALBUMIN 4.4 11/28/2021 1417   ALBUMIN 4.9 (H) 11/28/2017 1037   AST 28 11/28/2021 1417   ALT 24 11/28/2021 1417   ALKPHOS 84 11/28/2021 1417   BILITOT 0.4 11/28/2021 1417   BILITOT 0.4 11/28/2017 1037   GFRNONAA >60 07/25/2018 1627   GFRAA >60 07/25/2018 1627   Lab Results  Component Value Date   CHOL 150 11/28/2021   HDL 58.60 11/28/2021   LDLCALC 65 11/28/2021   TRIG 133.0 11/28/2021    CHOLHDL 3 11/28/2021   Lab Results  Component Value Date   HGBA1C 6.1 (A) 12/04/2021   Lab Results  Component Value Date   VITAMINB12 672 11/28/2021   Lab Results  Component Value Date   TSH 0.93 11/28/2021    PHYSICAL EXAM:  Today's Vitals   05/16/22 1445  BP: 133/76  Pulse: 75  Weight: 192 lb (87.1 kg)  Height: 5\' 1"  (1.549 m)   Body mass index is 36.28 kg/m.   Wt Readings from Last 3 Encounters:  05/16/22 192 lb (87.1 kg)  05/15/22 195 lb (88.5 kg)  01/30/22 195 lb (88.5 kg)     Ht Readings from Last 3 Encounters:  05/16/22 5\' 1"  (1.549 m)  05/15/22 5\' 2"  (1.575 m)  01/30/22 5\' 2"  (1.575 m)      General: The patient is awake, alert and appears not in acute distress. The patient is well groomed. Head: Normocephalic, atraumatic. Neck is supple. Mallampati 4-5 ,  neck circumference:15.5 . Nasal airflow congested- all year round . Retrognathia is not seen.  Cardiovascular:  Regular rate and rhythm , without  murmurs or carotid bruit, and without distended neck veins.Respiratory: Lungs are wheezing. Skin:  Without evidence of edema, or rash BMI is 32. The patient's posture is erect .    Neurologic exam : The patient is awake and alert, oriented to place and time.   MOCA:No flowsheet data found. Attention span & concentration ability appears normal.  Speech is fluent, without dysarthria, but there is still dysphonia .  Mood and affect are appropriate.   Cranial nerves: Pupils are unequal in size- left larger 5 mm versus right 4 mm-  uses one contact in her right. both briskly reactive to light.  Funduscopic exam deferred. Facial motor strength is symmetric and tongue moves midline.  Motor exam:   Normal tone, muscle bulk and symmetric strength in all extremities. Sensory:  intact to tuning fork vibration.    Coordination: Finger-to-nose maneuver was intact - without evidence  of ataxia, dysmetria or tremor.   Gait and station: Patient could rise unassisted from  a seated position, walked without assistive device.  Stance is of normal width/ base and the patient turned with 3 steps.  Toe and heel walk were deferred.  Deep tendon reflexes: in the  upper and lower extremities are symmetric and intact.  Babinski response was deferred.      ASSESSMENT AND PLAN 72 y.o. year old female  here with:  OSA on CPAP, Obesity with hypoventilation in REM, mild OSA, COPD ( Dr Maple Hudson) and stroke Dr Terrace Arabia and Lucia Gaskins.     1) OSA treatment has been successful, she sleeps not necessarily longer with CPAP but has been feeling more rested.  Still ongoing Insomnia. History of alcohol "overuse"   2)again struggling with allergic sinusitis and rhinitis and coughing , interfering with compliance.   3) needs new CPAP after 5 years, but we need a new baseline. Trazodone has been renewed . She has implemented routines, quit drinking and yet-she gained weight. [name] Kitchen   4) she mentioned subjective memory loss which I cannot address in this visit.  She thinks this became evident with her stroke in February 2019, status post left thalamic stroke . The thalamus o is part of the brain that generates sleep spindles.   5)OSA / COPD/ :  I will order a HST as she cannot imagine to sleep in the lab Please schedule before May 20 or after June 9th .[name] Kitchen Her oxygen levels in sleep are not known. ONO on new CPAP after we renewed her prescription.   I plan to follow up either personally or through our NP within 5 months.  CC Dr Lucia Gaskins , Dr Terrace Arabia,   I would like to thank Myrlene Broker, MD for allowing me to meet with and to take care of this pleasant patient.    After spending a total time of  45  minutes face to face and additional time for physical and neurologic examination, review of laboratory studies,  personal review of imaging studies, reports and results of other testing and review of referral information / records as far as provided in visit,   Electronically signed by: Melvyn Novas,  MD 05/16/2022 3:38 PM  [facility] Board certified by The [employer] of Sleep Medicine and Diplomate of the [employer] of Sleep Medicine. Board certified In Neurology through the ABPN, Fellow of the [employer] of Neurology. Medical Director of [facility].

## 2022-05-18 ENCOUNTER — Telehealth: Payer: Self-pay | Admitting: Internal Medicine

## 2022-05-18 NOTE — Telephone Encounter (Signed)
Patient states needs refill for Trelegy, Ventolin and Montelukast. Needs 3 month supply for all. Pharmacy is [facility] [address] Patient phone number is [phone].

## 2022-05-21 ENCOUNTER — Other Ambulatory Visit: Payer: Self-pay

## 2022-05-21 DIAGNOSIS — J4540 Moderate persistent asthma, uncomplicated: Secondary | ICD-10-CM

## 2022-05-21 MED ORDER — ALBUTEROL SULFATE HFA 108 (90 BASE) MCG/ACT IN AERS: INHALATION_SPRAY | RESPIRATORY_TRACT | 3 refills | Status: DC

## 2022-05-21 MED ORDER — MONTELUKAST SODIUM 10 MG PO TABS: 10.0000 mg | ORAL_TABLET | Freq: Every day | ORAL | 3 refills | Status: DC

## 2022-05-21 MED ORDER — TRELEGY ELLIPTA 100-62.5-25 MCG/ACT IN AEPB: INHALATION_SPRAY | RESPIRATORY_TRACT | 4 refills | Status: DC

## 2022-05-21 NOTE — Telephone Encounter (Signed)
Please advise refills have been sent to pharmacy ATC X1 LVM

## 2022-05-21 NOTE — Telephone Encounter (Signed)
ATCX1 LVM for patient to call the office back. Please advise medication has been sent to pharmacy.

## 2022-05-23 ENCOUNTER — Telehealth: Payer: Self-pay

## 2022-05-23 ENCOUNTER — Other Ambulatory Visit: Payer: Self-pay | Admitting: Family Medicine

## 2022-05-23 ENCOUNTER — Other Ambulatory Visit: Payer: Self-pay | Admitting: Internal Medicine

## 2022-05-23 NOTE — Telephone Encounter (Signed)
Patient states she was supposed to continue her duloxetine but she needed it filled for a 3 month supply to [facility] [address]. States it wasn't sent in. Can this be done today? Thank you

## 2022-05-24 ENCOUNTER — Other Ambulatory Visit: Payer: Self-pay

## 2022-05-24 MED ORDER — DULOXETINE HCL 20 MG PO CPEP: 20.0000 mg | ORAL_CAPSULE | Freq: Every day | ORAL | 0 refills | Status: DC

## 2022-05-24 NOTE — Telephone Encounter (Signed)
Rx filled

## 2022-05-28 ENCOUNTER — Ambulatory Visit (INDEPENDENT_AMBULATORY_CARE_PROVIDER_SITE_OTHER): Payer: Medicare HMO | Admitting: Internal Medicine

## 2022-05-28 ENCOUNTER — Encounter: Payer: Self-pay | Admitting: Internal Medicine

## 2022-05-28 VITALS — BP 138/80 | HR 71 | Temp 98.7°F | Ht 61.0 in | Wt 190.0 lb

## 2022-05-28 DIAGNOSIS — E118 Type 2 diabetes mellitus with unspecified complications: Secondary | ICD-10-CM | POA: Diagnosis not present

## 2022-05-28 DIAGNOSIS — Z0001 Encounter for general adult medical examination with abnormal findings: Secondary | ICD-10-CM

## 2022-05-28 DIAGNOSIS — J449 Chronic obstructive pulmonary disease, unspecified: Secondary | ICD-10-CM | POA: Diagnosis not present

## 2022-05-28 DIAGNOSIS — Z8673 Personal history of transient ischemic attack (TIA), and cerebral infarction without residual deficits: Secondary | ICD-10-CM | POA: Diagnosis not present

## 2022-05-28 DIAGNOSIS — F1021 Alcohol dependence, in remission: Secondary | ICD-10-CM | POA: Diagnosis not present

## 2022-05-28 DIAGNOSIS — Z7985 Long-term (current) use of injectable non-insulin antidiabetic drugs: Secondary | ICD-10-CM

## 2022-05-28 DIAGNOSIS — Z0000 Encounter for general adult medical examination without abnormal findings: Secondary | ICD-10-CM | POA: Diagnosis not present

## 2022-05-28 LAB — CBC
HCT: 44.5 % (ref 36.0–46.0)
Hemoglobin: 15.1 g/dL — ABNORMAL HIGH (ref 12.0–15.0)
MCHC: 34 g/dL (ref 30.0–36.0)
MCV: 86.8 fl (ref 78.0–100.0)
Platelets: 294 10*3/uL (ref 150.0–400.0)
RBC: 5.12 Mil/uL — ABNORMAL HIGH (ref 3.87–5.11)
RDW: 14.1 % (ref 11.5–15.5)
WBC: 8.4 10*3/uL (ref 4.0–10.5)

## 2022-05-28 LAB — COMPREHENSIVE METABOLIC PANEL
ALT: 23 U/L (ref 0–35)
AST: 30 U/L (ref 0–37)
Albumin: 4.8 g/dL (ref 3.5–5.2)
Alkaline Phosphatase: 80 U/L (ref 39–117)
BUN: 8 mg/dL (ref 6–23)
CO2: 29 mEq/L (ref 19–32)
Calcium: 9.7 mg/dL (ref 8.4–10.5)
Chloride: 101 mEq/L (ref 96–112)
Creatinine, Ser: 0.67 mg/dL (ref 0.40–1.20)
GFR: 88.11 mL/min (ref >60.00)
Glucose, Bld: 104 mg/dL — ABNORMAL HIGH (ref 70–99)
Potassium: 4.1 mEq/L (ref 3.5–5.1)
Sodium: 140 mEq/L (ref 135–145)
Total Bilirubin: 0.6 mg/dL (ref 0.2–1.2)
Total Protein: 8.3 g/dL (ref 6.0–8.3)

## 2022-05-28 LAB — MICROALBUMIN / CREATININE URINE RATIO
Creatinine,U: 73 mg/dL
Microalb Creat Ratio: 1 mg/g (ref 0.0–30.0)
Microalb, Ur: <0.7 mg/dL (ref 0.0–1.9)

## 2022-05-28 LAB — HEMOGLOBIN A1C: Hgb A1c MFr Bld: 6 % (ref 4.6–6.5)

## 2022-05-28 MED ORDER — AMLODIPINE BESYLATE 5 MG PO TABS: ORAL_TABLET | ORAL | 3 refills | Status: DC

## 2022-05-28 NOTE — Assessment & Plan Note (Signed)
No signs of new stroke. Continue medications as needed. Not on statin currently due to lipids at goal without.

## 2022-05-28 NOTE — Assessment & Plan Note (Signed)
BMI 35 complicated by hypertension and osteoarthritis. Counseled on weight loss and are titrating mounjaro to help.

## 2022-05-28 NOTE — Assessment & Plan Note (Signed)
Drinks rarely and previous 40-50 years of 2-3 bottles wine per night. Continue lifelong cessation.

## 2022-05-28 NOTE — Assessment & Plan Note (Signed)
Started mounjaro 2.5 mg weekly and was able to tolerate. We will plan to titrate to 5 mg or 7.5 mg weekly if she is unable to find 5 mg. Checking HgA1c, microalbumin to creatinine ratio and CMP today.

## 2022-05-28 NOTE — Assessment & Plan Note (Signed)
Flu shot counseled. Pneumonia counseled. Shingrix counseled. Tetanus counseled. Colonoscopy up to date. Mammogram up to date, pap smear aged out and dexa complete. Counseled about sun safety and mole surveillance. Counseled about the dangers of distracted driving. Given 10 year screening recommendations.

## 2022-05-28 NOTE — Progress Notes (Signed)
   Subjective:   Patient ID: Toni Hicks, female    DOB: [birth year], 72 y.o.   MRN: [id number]  HPI The patient is here for physical.  PMH, [facility], social history reviewed and updated  Review of Systems  Constitutional:  Positive for fatigue.  HENT: Negative.    Eyes: Negative.   Respiratory:  Positive for shortness of breath. Negative for cough and chest tightness.   Cardiovascular:  Negative for chest pain, palpitations and leg swelling.  Gastrointestinal:  Negative for abdominal distention, abdominal pain, constipation, diarrhea, nausea and vomiting.  Musculoskeletal: Negative.   Skin: Negative.   Neurological: Negative.        Word finding difficulty  Psychiatric/Behavioral: Negative.      Objective:  Physical Exam Constitutional:      Appearance: She is well-developed.  HENT:     Head: Normocephalic and atraumatic.  Cardiovascular:     Rate and Rhythm: Normal rate and regular rhythm.  Pulmonary:     Effort: Pulmonary effort is normal. No respiratory distress.     Breath sounds: Normal breath sounds. No wheezing or rales.  Abdominal:     General: Bowel sounds are normal. There is no distension.     Palpations: Abdomen is soft.     Tenderness: There is no abdominal tenderness. There is no rebound.  Musculoskeletal:     Cervical back: Normal range of motion.  Skin:    General: Skin is warm and dry.     Comments: Foot exam done  Neurological:     Mental Status: She is alert and oriented to person, place, and time.     Coordination: Coordination normal.     Vitals:   05/28/22 1049  BP: 138/80  Pulse: 71  Temp: 98.7 F (37.1 C)  TempSrc: Oral  SpO2: 90%  Weight: 190 lb (86.2 kg)  Height: 5\' 1"  (1.549 m)    Assessment & Plan:

## 2022-05-28 NOTE — Assessment & Plan Note (Signed)
Some SOB with activity and uses albuterol prn. No flare today.

## 2022-05-28 NOTE — Patient Instructions (Signed)
Check with local pharmacies to see if they have the 5 mg or 7.5 mg mounjaro to help.

## 2022-06-05 ENCOUNTER — Other Ambulatory Visit: Payer: Self-pay | Admitting: Family Medicine

## 2022-06-05 ENCOUNTER — Telehealth: Payer: Self-pay | Admitting: Neurology

## 2022-06-05 NOTE — Telephone Encounter (Signed)
05/31/22 LVM [place] 05/17/22 [employer] no auth req EE

## 2022-06-28 ENCOUNTER — Other Ambulatory Visit: Payer: Self-pay | Admitting: Internal Medicine

## 2022-06-28 ENCOUNTER — Other Ambulatory Visit: Payer: Self-pay | Admitting: Family Medicine

## 2022-07-03 NOTE — Telephone Encounter (Signed)
She was supposed to see if pharmacies had 5 or 7.5 mg and let us know and we would prescribe. She is ready go up on dosing and should not refill 2.5 mg weekly can you connect with patient?

## 2022-08-03 ENCOUNTER — Encounter: Payer: Self-pay | Admitting: Internal Medicine

## 2022-08-03 ENCOUNTER — Ambulatory Visit: Payer: Medicare HMO | Admitting: Internal Medicine

## 2022-08-03 VITALS — BP 122/78 | HR 75 | Temp 98.9°F | Ht 61.0 in | Wt 193.0 lb

## 2022-08-03 DIAGNOSIS — E118 Type 2 diabetes mellitus with unspecified complications: Secondary | ICD-10-CM

## 2022-08-03 DIAGNOSIS — L03116 Cellulitis of left lower limb: Secondary | ICD-10-CM

## 2022-08-03 DIAGNOSIS — I10 Essential (primary) hypertension: Secondary | ICD-10-CM | POA: Diagnosis not present

## 2022-08-03 DIAGNOSIS — E559 Vitamin D deficiency, unspecified: Secondary | ICD-10-CM

## 2022-08-03 MED ORDER — DOXYCYCLINE HYCLATE 100 MG PO TABS: 100.0000 mg | ORAL_TABLET | Freq: Two times a day (BID) | ORAL | 0 refills | Status: DC

## 2022-08-03 NOTE — Progress Notes (Unsigned)
Patient ID: Toni Hicks, female   DOB: 01-Jan-1951, 72 y.o.   MRN: [id number]        Chief Complaint: follow up left leg cellulitis, htn, low vit d, dm       HPI:  Toni Hicks is a 72 y.o. female here with c/o left leg red swelling tender after some type of insect bite it seems to mid medial left upper leg,worsening x 2-3 days.  No fever, chills.  Pt denies chest pain, increased sob or doe, wheezing, orthopnea, PND, increased LE swelling, palpitations, dizziness or syncope.   Pt denies polydipsia, polyuria, or new focal neuro s/s.          Wt Readings from Last 3 Encounters:  08/03/22 193 lb (87.5 kg)  05/28/22 190 lb (86.2 kg)  05/16/22 192 lb (87.1 kg)   BP Readings from Last 3 Encounters:  08/03/22 122/78  05/28/22 138/80  05/16/22 133/76         Past Medical History:  Diagnosis Date   ADD (attention deficit disorder)    Adult acne    Alcohol abuse    Allergic rhinitis    Anxiety and depression    Asthma    Congenital pneumonia    Eczema    Hypoglycemia    no longer per pt    Hypothyroidism    Impaired fasting blood sugar    Mycoplasma pneumonia    Restless leg syndrome    Seasonal allergies    Stroke (HCC)    Urticaria    Past Surgical History:  Procedure Laterality Date   DILATION AND CURETTAGE OF UTERUS     @ 72 years old   TOTAL HIP ARTHROPLASTY Left 07/29/2018   Procedure: Left Anterior Hip Arthroplasty;  Surgeon: Marcene Corning, MD;  Location: [facility];  Service: Orthopedics;  Laterality: Left;   WISDOM TOOTH EXTRACTION      reports that she quit smoking about 44 years ago. Her smoking use included cigarettes. She started smoking about 59 years ago. She has a 30 pack-year smoking history. She has never used smokeless tobacco. She reports current alcohol use. She reports that she does not use drugs. family history includes Breast cancer in her mother; COPD in her father; Cancer in her mother; Coronary artery disease in her father; Dementia in her mother; Heart  failure in her father; Hypertension in her mother; Stroke in her mother. Allergies  Allergen Reactions   Astelin [Azelastine] Other (See Comments)    Per patient caused psychiatric illness   Nasonex [Mometasone] Other (See Comments)    Per patient caused psychiatric illness   Other     Can not eat carbohydrates without protein, is allergic to certain foods but can take in certain doses   Current Outpatient Medications on File Prior to Visit  Medication Sig Dispense Refill   acetaminophen (TYLENOL) 500 MG tablet Take 500 mg by mouth every 8 (eight) hours as needed.      albuterol (VENTOLIN HFA) 108 (90 Base) MCG/ACT inhaler INHALE 1-2 PUFFS BY MOUTH EVERY 6 HOURS AS NEEDED FOR WHEEZE OR SHORTNESS OF BREATH 24 g 3   amLODipine (NORVASC) 5 MG tablet TAKE 1 TABLET BY MOUTH EVERY DAY 90 tablet 3   azithromycin (ZITHROMAX) 250 MG tablet Take two today and then one daily until finished. 6 tablet 0   b complex vitamins tablet Take 1 tablet by mouth 3 (three) times a week.     benzonatate (TESSALON) 200 MG capsule Take 1 capsule (200  mg total) by mouth 3 (three) times daily as needed for cough. 30 capsule 1   DULoxetine (CYMBALTA) 20 MG capsule Take 1 capsule (20 mg total) by mouth daily. 90 capsule 0   Dupilumab (DUPIXENT) 300 MG/2ML SOPN Inject 300 mg into the skin every 14 (fourteen) days. 12 mL 1   EPINEPHrine 0.3 mg/0.3 mL IJ SOAJ injection Inject 0.3 mg into the muscle as needed for anaphylaxis. 1 each 2   fluocinonide cream (LIDEX) 0.05 % Apply 1 application topically 2 (two) times daily as needed. 30 g 5   Fluticasone-Umeclidin-Vilant (TRELEGY ELLIPTA) 100-62.5-25 MCG/ACT AEPB Inhale 1 puff then rinse mouth, once daily 180 each 4   hydrocortisone 2.5 % ointment Apply topically 2 (two) times daily. 60 g 5   meloxicam (MOBIC) 7.5 MG tablet TAKE 1 TABLET BY MOUTH EVERY DAY 90 tablet 1   montelukast (SINGULAIR) 10 MG tablet Take 1 tablet (10 mg total) by mouth at bedtime. 90 tablet 3   traZODone  (DESYREL) 50 MG tablet TAKE 0.5-1 TABLETS BY MOUTH AT BEDTIME AS NEEDED FOR SLEEP. 90 tablet 2   Vitamin D, Ergocalciferol, (DRISDOL) 1.25 MG (50000 UNIT) CAPS capsule TAKE 1 CAPSULE (50,000 UNITS TOTAL) BY MOUTH EVERY 7 (SEVEN) DAYS 12 capsule 0   No current facility-administered medications on file prior to visit.        ROS:  All others reviewed and negative.  Objective        PE:  BP 122/78 (BP Location: Right Arm, Patient Position: Sitting, Cuff Size: Normal)   Pulse 75   Temp 98.9 F (37.2 C) (Oral)   Ht 5\' 1"  (1.549 m)   Wt 193 lb (87.5 kg)   SpO2 99%   BMI 36.47 kg/m                 Constitutional: Pt appears in NAD               HENT: Head: NCAT.                Right Ear: External ear normal.                 Left Ear: External ear normal.                Eyes: . Pupils are equal, round, and reactive to light. Conjunctivae and EOM are normal               Nose: without d/c or deformity               Neck: Neck supple. Gross normal ROM               Cardiovascular: Normal rate and regular rhythm.                 Pulmonary/Chest: Effort normal and breath sounds without rales or wheezing.                Abd:  Soft, NT, ND, + BS, no organomegaly               Neurological: Pt is alert. At baseline orientation, motor grossly intact               Skin: Skin is warm. LE edema - none, left medial upper leg with 8  x 6 cm large area red, tender swelling around a probable central bite site               Psychiatric: Pt  behavior is normal without agitation   Micro: none  Cardiac tracings I have personally interpreted today:  none  Pertinent Radiological findings (summarize): none   Lab Results  Component Value Date   WBC 8.4 05/28/2022   HGB 15.1 (H) 05/28/2022   HCT 44.5 05/28/2022   PLT 294.0 05/28/2022   GLUCOSE 104 (H) 05/28/2022   CHOL 150 11/28/2021   TRIG 133.0 11/28/2021   HDL 58.60 11/28/2021   LDLCALC 65 11/28/2021   ALT 23 05/28/2022   AST 30 05/28/2022   NA  140 05/28/2022   K 4.1 05/28/2022   CL 101 05/28/2022   CREATININE 0.67 05/28/2022   BUN 8 05/28/2022   CO2 29 05/28/2022   TSH 0.93 11/28/2021   INR 1.0 07/25/2018   HGBA1C 6.0 05/28/2022   MICROALBUR <0.7 05/28/2022   Assessment/Plan:  Toni Hicks is a 72 y.o. White or Caucasian [1] female with  has a past medical history of ADD (attention deficit disorder), Adult acne, Alcohol abuse, Allergic rhinitis, Anxiety and depression, Asthma, Congenital pneumonia, Eczema, Hypoglycemia, Hypothyroidism, Impaired fasting blood sugar, Mycoplasma pneumonia, Restless leg syndrome, Seasonal allergies, Stroke (HCC), and Urticaria.  Left leg cellulitis Mild to mod, for antibx course doxycycline 100 bid,  to f/u any worsening symptoms or concerns  Essential hypertension BP Readings from Last 3 Encounters:  08/03/22 122/78  05/28/22 138/80  05/16/22 133/76   Stable, pt to continue medical treatment norvasc 5 qd   Vitamin D deficiency Last vitamin D Lab Results  Component Value Date   VD25OH 24.51 (L) 11/28/2021   Low, to start oral replacement   Diabetes mellitus type 2 with complications (HCC) Lab Results  Component Value Date   HGBA1C 6.0 05/28/2022   Stable, pt to continue current medical treatment  - diet, wt control  Followup: Return if symptoms worsen or fail to improve.  Oliver Barre, MD 08/05/2022 3:51 PM [facility]

## 2022-08-03 NOTE — Patient Instructions (Signed)
Please take all new medication as prescribed - the antibiotic  Please continue all other medications as before, and refills have been done if requested.  Please have the pharmacy call with any other refills you may need.  Please keep your appointments with your specialists as you may have planned   

## 2022-08-05 ENCOUNTER — Encounter: Payer: Self-pay | Admitting: Internal Medicine

## 2022-08-05 DIAGNOSIS — L03116 Cellulitis of left lower limb: Secondary | ICD-10-CM | POA: Insufficient documentation

## 2022-08-05 NOTE — Assessment & Plan Note (Signed)
Last vitamin D Lab Results  Component Value Date   VD25OH 24.51 (L) 11/28/2021   Low, to start oral replacement

## 2022-08-05 NOTE — Assessment & Plan Note (Signed)
BP Readings from Last 3 Encounters:  08/03/22 122/78  05/28/22 138/80  05/16/22 133/76   Stable, pt to continue medical treatment norvasc 5 qd

## 2022-08-05 NOTE — Assessment & Plan Note (Signed)
Mild to mod, for antibx course doxycycline 100 bid,  to f/u any worsening symptoms or concerns 

## 2022-08-05 NOTE — Assessment & Plan Note (Signed)
Lab Results  Component Value Date   HGBA1C 6.0 05/28/2022   Stable, pt to continue current medical treatment  - diet, wt control

## 2022-08-16 DIAGNOSIS — H31001 Unspecified chorioretinal scars, right eye: Secondary | ICD-10-CM | POA: Diagnosis not present

## 2022-08-16 DIAGNOSIS — H2513 Age-related nuclear cataract, bilateral: Secondary | ICD-10-CM | POA: Diagnosis not present

## 2022-08-16 DIAGNOSIS — H52203 Unspecified astigmatism, bilateral: Secondary | ICD-10-CM | POA: Diagnosis not present

## 2022-08-16 DIAGNOSIS — H5213 Myopia, bilateral: Secondary | ICD-10-CM | POA: Diagnosis not present

## 2022-08-20 NOTE — Progress Notes (Unsigned)
[facility] [address] [place] [address] Phone: [phone] Subjective:   Toni Toni [facility], am serving as a scribe for Dr. Antoine Toni Hicks.  I'm seeing this patient by the request  of:  Toni Broker, MD  CC: Knee pain, back pain and other things  [id number]  05/15/2022 Discussed with patient again at great length.  Doing well and would like to hold on injections at this time.  Discussed continuing repeat again in 3 months if needed.  Continue to work on weight loss.  Total time with patient today 32 minutes discussing different treatment options and comorbidities.   Patient is doing better but still not perfect.  Patient is having 1 drink at night at the moment.  Feels like she has been under control.  Feels like she has a good support system around her.  No dysuria.  She denies anything else.     Update 08/21/2022 Toni Toni Hicks is a 72 y.o. female coming in with complaint of R knee pain. Patient states that he knees are doing better. Intermittent pain in medial aspect of R knee. Using topical oil for pain management. Using turmeric and fish oil. Walking and swimming 3x a week. Going to start using adjustable kettlebells for strength training.     Past Medical History:  Diagnosis Date   ADD (attention deficit disorder)    Adult acne    Alcohol abuse    Allergic rhinitis    Anxiety and depression    Asthma    Congenital pneumonia    Eczema    Hypoglycemia    no longer per pt    Hypothyroidism    Impaired fasting blood sugar    Mycoplasma pneumonia    Restless leg syndrome    Seasonal allergies    Stroke (HCC)    Urticaria    Past Surgical History:  Procedure Laterality Date   DILATION AND CURETTAGE OF UTERUS     @ 72 years old   TOTAL HIP ARTHROPLASTY Left 07/29/2018   Procedure: Left Anterior Hip Arthroplasty;  Surgeon: Toni Corning, MD;  Location: [facility];  Service: Orthopedics;  Laterality: Left;   WISDOM TOOTH  EXTRACTION     Social History   Socioeconomic History   Marital status: Widowed    Spouse name: has boyfriend Toni Hicks   Number of children: 2   Years of education: 17   Highest education level: Some college, no degree  Occupational History   Occupation: Product/process development scientist    Comment: Semi-retired  Tobacco Use   Smoking status: Former    Current packs/day: 0.00    Average packs/day: 2.0 packs/day for 15.0 years (30.0 ttl pk-yrs)    Types: Cigarettes    Start date: 109    Quit date: 1980    Years since quitting: 44.6   Smokeless tobacco: Never  Vaping Use   Vaping status: Former  Substance and Sexual Activity   Alcohol use: Yes    Alcohol/week: 0.0 standard drinks of alcohol    Comment: 3-5 glasses of wine day    Drug use: No   Sexual activity: Not on file  Other Topics Concern   Not on file  Social History Narrative   HS[employer], [employer] MA- photography   Married '73- 10 years divorced, married '86- 10 years divorced, Married '96- 10 years- widowed.   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   Social  Determinants of Health   Financial Resource Strain: Low Risk  (01/18/2022)   Overall Financial Resource Strain (CARDIA)    Difficulty of Paying Living Expenses: Not hard at all  Food Insecurity: No Food Insecurity (01/18/2022)   Hunger Vital Sign    Worried About Running Out of Food in the Last Year: Never true    Ran Out of Food in the Last Year: Never true  Transportation Needs: No Transportation Needs (01/18/2022)   PRAPARE - Administrator, Civil Service (Medical): No    Lack of Transportation (Non-Medical): No  Physical Activity: Not on file  Stress: No Stress Concern Present (01/18/2022)   [employer] of Occupational Health - Occupational Stress Questionnaire    Feeling of Stress : Not at all  Social Connections: Moderately Integrated (01/18/2022)   Social Connection and Isolation  Panel [NHANES]    Frequency of Communication with Friends and Family: More than three times a week    Frequency of Social Gatherings with Friends and Family: More than three times a week    Attends Religious Services: More than 4 times per year    Active Member of [employer] or Organizations: Yes    Attends Banker Meetings: More than 4 times per year    Marital Status: Widowed   Allergies  Allergen Reactions   Astelin [Azelastine] Other (See Comments)    Per patient caused psychiatric illness   Nasonex [Mometasone] Other (See Comments)    Per patient caused psychiatric illness   Other     Can not eat carbohydrates without protein, is allergic to certain foods but can take in certain doses   Family History  Problem Relation Age of Onset   COPD Father    Heart failure Father    Coronary artery disease Father    Cancer Mother        breast   Hypertension Mother    Dementia Mother    Stroke Mother    Breast cancer Mother      Current Outpatient Medications (Cardiovascular):    amLODipine (NORVASC) 5 MG tablet, TAKE 1 TABLET BY MOUTH EVERY DAY   EPINEPHrine 0.3 mg/0.3 mL IJ SOAJ injection, Inject 0.3 mg into the muscle as needed for anaphylaxis.  Current Outpatient Medications (Respiratory):    albuterol (VENTOLIN HFA) 108 (90 Base) MCG/ACT inhaler, INHALE 1-2 PUFFS BY MOUTH EVERY 6 HOURS AS NEEDED FOR WHEEZE OR SHORTNESS OF BREATH   benzonatate (TESSALON) 200 MG capsule, Take 1 capsule (200 mg total) by mouth 3 (three) times daily as needed for cough.   Fluticasone-Umeclidin-Vilant (TRELEGY ELLIPTA) 100-62.5-25 MCG/ACT AEPB, Inhale 1 puff then rinse mouth, once daily   montelukast (SINGULAIR) 10 MG tablet, Take 1 tablet (10 mg total) by mouth at bedtime.  Current Outpatient Medications (Analgesics):    acetaminophen (TYLENOL) 500 MG tablet, Take 500 mg by mouth every 8 (eight) hours as needed.    Current Outpatient Medications (Other):    azithromycin (ZITHROMAX)  250 MG tablet, Take two today and then one daily until finished.   b complex vitamins tablet, Take 1 tablet by mouth 3 (three) times a week.   doxycycline (VIBRA-TABS) 100 MG tablet, Take 1 tablet (100 mg total) by mouth 2 (two) times daily.   DULoxetine (CYMBALTA) 20 MG capsule, Take 1 capsule (20 mg total) by mouth daily.   Dupilumab (DUPIXENT) 300 MG/2ML SOPN, Inject 300 mg into the skin every 14 (fourteen) days.   fluocinonide cream (LIDEX) 0.05 %, Apply 1  application topically 2 (two) times daily as needed.   hydrocortisone 2.5 % ointment, Apply topically 2 (two) times daily.   traZODone (DESYREL) 50 MG tablet, TAKE 0.5-1 TABLETS BY MOUTH AT BEDTIME AS NEEDED FOR SLEEP.   Vitamin D, Ergocalciferol, (DRISDOL) 1.25 MG (50000 UNIT) CAPS capsule, TAKE 1 CAPSULE (50,000 UNITS TOTAL) BY MOUTH EVERY 7 (SEVEN) DAYS   Reviewed prior external information including notes and imaging from  primary care provider As well as notes that were available from care everywhere and other healthcare systems.  Past medical history, social, surgical and family history all reviewed in electronic medical record.  No pertanent information unless stated regarding to the chief complaint.   Review of Systems:  No headache, visual changes, nausea, vomiting, diarrhea, constipation, dizziness, abdominal pain, skin rash, fevers, chills, night sweats, weight loss, swollen lymph nodes, body aches, joint swelling, chest pain, shortness of breath, mood changes. POSITIVE muscle aches difficulty with memory  Objective  Blood pressure 116/82, pulse 80, height 5\' 1"  (1.549 m), weight 191 lb (86.6 kg), SpO2 97%.   General: No apparent distress alert and oriented x3 mood and affect normal, dressed appropriately.  HEENT: Pupils equal, extraocular movements intact  Respiratory: Patient's speak in full sentences and does not appear short of breath  Cardiovascular: No lower extremity edema, non tender, no erythema  Patient's knees  do have crepitus noted.  Patient's right knee does have trace effusion noted. Instability noted, discussed HEP     Impression and Recommendations:     The above documentation has been reviewed and is accurate and complete Judi Saa, DO

## 2022-08-21 ENCOUNTER — Telehealth: Payer: Self-pay | Admitting: Internal Medicine

## 2022-08-21 ENCOUNTER — Other Ambulatory Visit: Payer: Self-pay

## 2022-08-21 ENCOUNTER — Ambulatory Visit: Payer: Medicare HMO | Admitting: Family Medicine

## 2022-08-21 VITALS — BP 116/82 | HR 80 | Ht 61.0 in | Wt 191.0 lb

## 2022-08-21 DIAGNOSIS — E538 Deficiency of other specified B group vitamins: Secondary | ICD-10-CM | POA: Diagnosis not present

## 2022-08-21 DIAGNOSIS — F1021 Alcohol dependence, in remission: Secondary | ICD-10-CM | POA: Diagnosis not present

## 2022-08-21 DIAGNOSIS — J4540 Moderate persistent asthma, uncomplicated: Secondary | ICD-10-CM

## 2022-08-21 DIAGNOSIS — E559 Vitamin D deficiency, unspecified: Secondary | ICD-10-CM

## 2022-08-21 DIAGNOSIS — M170 Bilateral primary osteoarthritis of knee: Secondary | ICD-10-CM

## 2022-08-21 MED ORDER — ZEPBOUND 5 MG/0.5ML ~~LOC~~ SOAJ: 5.0000 mg | SUBCUTANEOUS | 0 refills | Status: DC

## 2022-08-21 MED ORDER — ZEPBOUND 10 MG/0.5ML ~~LOC~~ SOAJ: 10.0000 mg | SUBCUTANEOUS | 0 refills | Status: DC

## 2022-08-21 MED ORDER — ZEPBOUND 7.5 MG/0.5ML ~~LOC~~ SOAJ: 7.5000 mg | SUBCUTANEOUS | 0 refills | Status: DC

## 2022-08-21 MED ORDER — ZEPBOUND 2.5 MG/0.5ML ~~LOC~~ SOAJ: 2.5000 mg | SUBCUTANEOUS | 0 refills | Status: DC

## 2022-08-21 MED ORDER — CYANOCOBALAMIN 1000 MCG/ML IJ SOLN
1000.0000 ug | Freq: Once | INTRAMUSCULAR | Status: AC
  Administered 2022-08-21: 1000 ug via INTRAMUSCULAR

## 2022-08-21 NOTE — Patient Instructions (Addendum)
Glad you are doing well Choline 500mg  dialy B12 injection today See me again in 3 months

## 2022-08-21 NOTE — Telephone Encounter (Signed)
Patient called and would like to try Zepbound - Please send to [facility] on [address] [place]  Please call patient and let her know when this has been sent.  [phone]

## 2022-08-21 NOTE — Telephone Encounter (Signed)
Sent in

## 2022-08-21 NOTE — Assessment & Plan Note (Signed)
Patient is proud of herself, down to 1 bottle of wine a week she states.  Has been doing this for over a year now.  Patient is having some peripheral neuropathy and B12 injection given today.  Patient is also having some difficulty with mentation so we will see if choline over-the-counter can be helpful.

## 2022-08-21 NOTE — Assessment & Plan Note (Signed)
Known arthritic changes but still seems to be stable.  Patient has lost some more weight at this moment encouraged her to continue to do so.  Does respond well to injections when needed though we will hold at this time and discuss again in follow-up in 3 months.

## 2022-08-21 NOTE — Assessment & Plan Note (Signed)
NSAID deficiency previously.  Continue the daily supplementation.

## 2022-08-26 ENCOUNTER — Other Ambulatory Visit: Payer: Self-pay | Admitting: Family Medicine

## 2022-10-01 ENCOUNTER — Encounter: Payer: Self-pay | Admitting: Pulmonary Disease

## 2022-10-01 ENCOUNTER — Ambulatory Visit: Payer: Medicare HMO | Admitting: Pulmonary Disease

## 2022-10-01 VITALS — BP 132/80 | HR 83 | Temp 97.9°F | Ht 61.0 in | Wt 195.0 lb

## 2022-10-01 DIAGNOSIS — J441 Chronic obstructive pulmonary disease with (acute) exacerbation: Secondary | ICD-10-CM | POA: Diagnosis not present

## 2022-10-01 MED ORDER — PREDNISONE 10 MG PO TABS: 40.0000 mg | ORAL_TABLET | Freq: Every day | ORAL | 0 refills | Status: AC

## 2022-10-01 MED ORDER — ZITHROMAX Z-PAK 250 MG PO TABS: ORAL_TABLET | ORAL | 0 refills | Status: DC

## 2022-10-01 NOTE — Patient Instructions (Signed)
VISIT SUMMARY:  During your visit, we discussed your worsening cough and respiratory symptoms over the past month. You have a history of asthma, COPD, and atopic dermatitis. Your cough has worsened over the past week, with 'crackling and funky noises' in the lungs and episodes of coughing until you vomit or gag. You also reported a 'massive luscious sneeze and snot' in the early morning hours. You are concerned about your ability to care for your granddaughter due to your current condition. You also mentioned that your partner's grandson, who had similar symptoms, was recently diagnosed with walking pneumonia. You have a history of pneumonia, having had it approximately 80 times in your life. You also reported that your heart rate increases significantly after coughing or with any activity. You are producing a large amount of mucus, which is a dark white color. You deny any fevers or chills. Your oxygen saturation occasionally drops to the high eighties, and your heart rate can reach 100 bpm.  YOUR PLAN:  -ACUTE EXACERBATION OF COPD/ASTHMA: This means your chronic lung conditions have worsened. We will start you on Azithromycin (Z-Pak) and Prednisone 40mg  daily for 5 days. Continue your current medications, Trelegy and Dupixent. Also, wear a mask around family for the next 5 days due to potential contagiousness.  -ATOPIC DERMATITIS: This is a condition that makes your skin red and itchy. It's well controlled on Dupixent, so continue with this medication.  INSTRUCTIONS:  Please start taking Azithromycin (Z-Pak) and Prednisone 40mg  daily for 5 days. Continue with your current medications, Trelegy and Dupixent. Wear a mask around family for the next 5 days due to potential contagiousness. If your symptoms worsen or you have any concerns, please contact the office.

## 2022-10-01 NOTE — Progress Notes (Signed)
Toni Hicks    [id number]    Jul 12, 1950  Primary Care Physician:Crawford, [name] Miles, MD  Referring Physician: Myrlene Broker, MD [address] [place],  [place] [address]  Chief complaint: Acute visit for asthma, COPD exacerbation  HPI: 72 y.o. who  has a past medical history of ADD (attention deficit disorder), Adult acne, Alcohol abuse, Allergic rhinitis, Anxiety and depression, Asthma, Congenital pneumonia, Eczema, Hypoglycemia, Hypothyroidism, Impaired fasting blood sugar, Mycoplasma pneumonia, Restless leg syndrome, Seasonal allergies, Stroke (HCC), and Urticaria.  Discussed the use of AI scribe software for clinical note transcription with the patient, who gave verbal consent to proceed.  The patient, with a history of asthma, COPD, and atopic dermatitis, presents with a worsening cough and respiratory symptoms over the past month. Initially, she attributed the cough to allergies, as she typically has reactions to crepe myrtles. However, the cough has significantly worsened over the past week, with 'crackling and funky noises' in the lungs and episodes of coughing until she vomits or gags. The coughing episodes occur every twenty minutes and last for about five minutes. She also reports a 'massive luscious sneeze and snot' in the early morning hours. The patient is concerned about her ability to care for her granddaughter due to her current condition. She also mentions that her partner's grandson, who had similar symptoms, was recently diagnosed with walking pneumonia. The patient has a history of pneumonia, having had it approximately 80 times in her life. She also reports that her heart rate increases significantly after coughing or with any activity. She is producing a large amount of mucus, which is a dark white color. She denies any fevers or chills. Her oxygen saturation occasionally drops to the high eighties, and her heart rate can reach 100 bpm.  The  patient's asthma and COPD are managed with Trelegy, and her atopic dermatitis has significantly improved with Dupixent injections. She quit smoking 30 years ago.     Data white  Outpatient Encounter Medications as of 10/01/2022  Medication Sig   albuterol (VENTOLIN HFA) 108 (90 Base) MCG/ACT inhaler INHALE 1-2 PUFFS BY MOUTH EVERY 6 HOURS AS NEEDED FOR WHEEZE OR SHORTNESS OF BREATH   amLODipine (NORVASC) 5 MG tablet TAKE 1 TABLET BY MOUTH EVERY DAY   b complex vitamins tablet Take 1 tablet by mouth 3 (three) times a week.   benzonatate (TESSALON) 200 MG capsule Take 1 capsule (200 mg total) by mouth 3 (three) times daily as needed for cough.   DULoxetine (CYMBALTA) 20 MG capsule TAKE 1 CAPSULE BY MOUTH EVERY DAY   Dupilumab (DUPIXENT) 300 MG/2ML SOPN Inject 300 mg into the skin every 14 (fourteen) days.   EPINEPHrine 0.3 mg/0.3 mL IJ SOAJ injection Inject 0.3 mg into the muscle as needed for anaphylaxis.   fluocinonide cream (LIDEX) 0.05 % Apply 1 application topically 2 (two) times daily as needed.   Fluticasone-Umeclidin-Vilant (TRELEGY ELLIPTA) 100-62.5-25 MCG/ACT AEPB Inhale 1 puff then rinse mouth, once daily   hydrocortisone 2.5 % ointment Apply topically 2 (two) times daily.   montelukast (SINGULAIR) 10 MG tablet Take 1 tablet (10 mg total) by mouth at bedtime.   predniSONE (DELTASONE) 10 MG tablet Take 4 tablets (40 mg total) by mouth daily with breakfast for 5 days.   traZODone (DESYREL) 50 MG tablet TAKE 0.5-1 TABLETS BY MOUTH AT BEDTIME AS NEEDED FOR SLEEP.   Vitamin D, Ergocalciferol, (DRISDOL) 1.25 MG (50000 UNIT) CAPS capsule TAKE 1 CAPSULE (50,000  UNITS TOTAL) BY MOUTH EVERY 7 (SEVEN) DAYS   ZITHROMAX Z-PAK 250 MG tablet Take 500 mg on day 1, followed by 250 mg once daily on days 2 to 5   [DISCONTINUED] acetaminophen (TYLENOL) 500 MG tablet Take 500 mg by mouth every 8 (eight) hours as needed.    [DISCONTINUED] azithromycin (ZITHROMAX) 250 MG tablet Take two today and then one  daily until finished.   [DISCONTINUED] doxycycline (VIBRA-TABS) 100 MG tablet Take 1 tablet (100 mg total) by mouth 2 (two) times daily.   [DISCONTINUED] tirzepatide (ZEPBOUND) 10 MG/0.5ML Pen Inject 10 mg into the skin once a week.   [DISCONTINUED] tirzepatide (ZEPBOUND) 2.5 MG/0.5ML Pen Inject 2.5 mg into the skin once a week.   [DISCONTINUED] tirzepatide (ZEPBOUND) 5 MG/0.5ML Pen Inject 5 mg into the skin once a week.   [DISCONTINUED] tirzepatide (ZEPBOUND) 7.5 MG/0.5ML Pen Inject 7.5 mg into the skin once a week.   No facility-administered encounter medications on file as of 10/01/2022.    Allergies as of 10/01/2022 - Review Complete 10/01/2022  Allergen Reaction Noted   Astelin [azelastine] Other (See Comments) 09/16/2019   Nasonex [mometasone] Other (See Comments) 09/16/2019   Other  03/05/2014    Past Medical History:  Diagnosis Date   ADD (attention deficit disorder)    Adult acne    Alcohol abuse    Allergic rhinitis    Anxiety and depression    Asthma    Congenital pneumonia    Eczema    Hypoglycemia    no longer per pt    Hypothyroidism    Impaired fasting blood sugar    Mycoplasma pneumonia    Restless leg syndrome    Seasonal allergies    Stroke (HCC)    Urticaria     Past Surgical History:  Procedure Laterality Date   DILATION AND CURETTAGE OF UTERUS     @ 72 years old   TOTAL HIP ARTHROPLASTY Left 07/29/2018   Procedure: Left Anterior Hip Arthroplasty;  Surgeon: Marcene Corning, MD;  Location: [facility];  Service: Orthopedics;  Laterality: Left;   WISDOM TOOTH EXTRACTION      Family History  Problem Relation Age of Onset   COPD Father    Heart failure Father    Coronary artery disease Father    Cancer Mother        breast   Hypertension Mother    Dementia Mother    Stroke Mother    Breast cancer Mother     Social History   Socioeconomic History   Marital status: Widowed    Spouse name: has boyfriend Sam   Number of children: 2   Years of  education: 17   Highest education level: Some college, no degree  Occupational History   Occupation: Product/process development scientist    Comment: Semi-retired  Tobacco Use   Smoking status: Former    Current packs/day: 0.00    Average packs/day: 2.0 packs/day for 15.0 years (30.0 ttl pk-yrs)    Types: Cigarettes    Start date: 70    Quit date: 1980    Years since quitting: 44.7   Smokeless tobacco: Never  Vaping Use   Vaping status: Former  Substance and Sexual Activity   Alcohol use: Yes    Alcohol/week: 0.0 standard drinks of alcohol    Comment: 3-5 glasses of wine day    Drug use: No   Sexual activity: Not on file  Other Topics Concern   Not on file  Social History Narrative  HSG, [employer] MA- photography   Married '73- 10 years divorced, married '86- 10 years divorced, Married '96- 10 years- widowed.   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   Social Determinants of Health   Financial Resource Strain: Low Risk  (01/18/2022)   Overall Financial Resource Strain (CARDIA)    Difficulty of Paying Living Expenses: Not hard at all  Food Insecurity: No Food Insecurity (01/18/2022)   Hunger Vital Sign    Worried About Running Out of Food in the Last Year: Never true    Ran Out of Food in the Last Year: Never true  Transportation Needs: No Transportation Needs (01/18/2022)   PRAPARE - Administrator, Civil Service (Medical): No    Lack of Transportation (Non-Medical): No  Physical Activity: Not on file  Stress: No Stress Concern Present (01/18/2022)   [employer] of Occupational Health - Occupational Stress Questionnaire    Feeling of Stress : Not at all  Social Connections: Moderately Integrated (01/18/2022)   Social Connection and Isolation Panel [NHANES]    Frequency of Communication with Friends and Family: More than three times a week    Frequency of Social Gatherings with Friends and  Family: More than three times a week    Attends Religious Services: More than 4 times per year    Active Member of [employer] or Organizations: Yes    Attends Banker Meetings: More than 4 times per year    Marital Status: Widowed  Intimate Partner Violence: Not At Risk (01/18/2022)   Humiliation, Afraid, Rape, and Kick questionnaire    Fear of Current or Ex-Partner: No    Emotionally Abused: No    Physically Abused: No    Sexually Abused: No    Review of systems: Review of Systems  Constitutional: Negative for fever and chills.  HENT: Negative.   Eyes: Negative for blurred vision.  Respiratory: as per HPI  Cardiovascular: Negative for chest pain and palpitations.  Gastrointestinal: Negative for vomiting, diarrhea, blood per rectum. Genitourinary: Negative for dysuria, urgency, frequency and hematuria.  Musculoskeletal: Negative for myalgias, back pain and joint pain.  Skin: Negative for itching and rash.  Neurological: Negative for dizziness, tremors, focal weakness, seizures and loss of consciousness.  Endo/Heme/Allergies: Negative for environmental allergies.  Psychiatric/Behavioral: Negative for depression, suicidal ideas and hallucinations.  All other systems reviewed and are negative.  Physical Exam: Blood pressure 132/80, pulse 83, temperature 97.9 F (36.6 C), temperature source Oral, height 5\' 1"  (1.549 m), weight 195 lb (88.5 kg), SpO2 95%. Gen:      No acute distress HEENT:  EOMI, sclera anicteric Neck:     No masses; no thyromegaly Lungs:    Bibasal crackle, wheeze CV:         Regular rate and rhythm; no murmurs Abd:      + bowel sounds; soft, non-tender; no palpable masses, no distension Ext:    No edema; adequate peripheral perfusion Skin:      Warm and dry; no rash Neuro: alert and oriented x 3 Psych: normal mood and affect   Assessment and Plan    Acute Exacerbation of COPD/Asthma Increased cough, sputum production, and dyspnea for the past month,  worsening in the past week. No fever. Lung exam reveals wheezing and crackles. -Start Azithromycin (Z-Pak) and Prednisone 40mg  daily for 5 days. -Continue Trelegy and Dupixent as maintenance therapy. -Advise to wear a mask around family  for the next 5 days due to potential contagiousness.  Atopic Dermatitis Well controlled on Dupixent. -Continue Dupixent.      Chilton Greathouse MD [facility] 10/01/2022, 10:14 AM  CC: Myrlene Broker, *

## 2022-10-12 ENCOUNTER — Other Ambulatory Visit: Payer: Self-pay | Admitting: Internal Medicine

## 2022-10-12 DIAGNOSIS — Z1212 Encounter for screening for malignant neoplasm of rectum: Secondary | ICD-10-CM

## 2022-10-12 DIAGNOSIS — Z1211 Encounter for screening for malignant neoplasm of colon: Secondary | ICD-10-CM

## 2022-11-13 ENCOUNTER — Other Ambulatory Visit: Payer: Self-pay | Admitting: Internal Medicine

## 2022-11-13 DIAGNOSIS — J4540 Moderate persistent asthma, uncomplicated: Secondary | ICD-10-CM

## 2022-11-17 NOTE — Progress Notes (Signed)
HPI F former smoker followed for OSA, complicated by Restless Legs, ETOH abuse, ETOH induced insomnia, Allergic rhinitis/ conjunctivitis, Nasal polyps, anxiety, depression, HBP, CVA, Hypothyroid, Asthma, Urticaria, DM NPSG Dohmeier 04/08/2017-  AHI 11.25/ hr, desaturation to 53%, 174 lbs Office Spirometry 03/20/2018 (Allergy and Asthma) Moderate obstruction, with FVC 1.84/ 70%, FEV1 1.09/ 54%, ratio 0.59/ 77%, FEF25-75% 0.44/ 24%. 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, Moxifloxacin Lab- IgE H 326 09/16/19 -------------------------------------------------------------------------------------------------- 10/17/21-  71 yoF former smoker followed for COPD, Lung Nodules, hx Recurrent Pneumonias, Atypical AFB,  complicated by OSA ( Dr Dohmeier/ [facility]), Restless Legs, ETOH abuse, ETOH induced insomnia, Allergic rhinitis/ conjunctivitis(Dr Padgett), Nasal polyps, anxiety, depression, HBP, CVA, Nasal Septal Perforation,  Hypothyroid,  Urticaria, DM2, Covid infection 2022  Covid vax- 4 Phizer, 1 Moderna, Flu vax-   declines (CPAP 9/ [facility]- followed by [facility]/ Dr Richardean Chimera) 100%, AHI 2.3/ hr -Ventolin hfa, Singulair, Trelegy 100, Neb albuterol, Dupixent, ACT score 20 -----Pt has been stopped up some due to allergies but is doing okay  Very pleased with her asthma control on Trelegy plus Dupixent. Still moderate seasonal flare of rhinitis with drainage. Flonase had caused mood change. Discussed cromolyn and azelastine options. She is proud of quitting ETOH "55 days ago[address]ologist. CT chest 05/26/21-  IMPRESSION: 1. Multiple small bilateral pulmonary nodules are stable and definitively benign. No further follow-up or characterization is required for these nodules. 2. Minimal emphysema. Aortic Atherosclerosis (ICD10-I70.0) and Emphysema (ICD10-J43.9).  11/20/22-  68 yoF former smoker  followed for COPD (30 pk yrs), Lung Nodules, hx Recurrent Pneumonias, Atypical AFB,  complicated by OSA ( Dr Dohmeier/ [facility]), Restless Legs, ETOH abuse, ETOH induced insomnia, , Nasal polyps, anxiety, depression, HBP, CVA, Nasal Septal Perforation,  Hypothyroid,  Urticaria, DM2, Covid infection 2022  (CPAP 9/ [facility]- followed by [facility]/ Dr Richardean Chimera) 100%, AHI 2.3/ hr -Ventolin hfa, Singulair, Trelegy 100, Neb albuterol, Dupixent, LOV 9/23-Dr Mannam- COPD exacerbation> Zpak, prednisone -----ACT 16 Breathing has been good  Mentions that her partner is a patient of Dr Marchelle Gearing with [facility]. She had slow recovery from  bronchitis in September, but says she is "fine now". Rare palpitation. Little routine cough, but always some.   ROS-see HPI   + = positive Constitutional:    weight loss, night sweats, fevers, chills, fatigue, lassitude. HEENT:    headaches, difficulty swallowing, tooth/dental problems, sore throat,       sneezing, itching, ear ache, +nasal congestion, post nasal drip, snoring CV:    chest pain, orthopnea, PND, swelling in lower extremities, anasarca,                                   dizziness, +palpitations Resp:   +shortness of breath with exertion or at rest.                +productive cough,   non-productive cough, coughing up of blood.              change in color of mucus.  wheezing.   Skin:    +rash or lesions. GI:    heartburn, +indigestion, abdominal pain, nausea, vomiting, diarrhea,                 change in bowel habits, loss of appetite GU: dysuria, change in color of urine, no urgency or frequency.  flank pain. MS:   joint pain, +stiffness, decreased range of motion, back pain. Neuro-     nothing unusual Psych:  change in mood or affect.  depression or +anxiety.   memory loss.  OBJ- Physical Exam General- Alert, Oriented, Affect-appropriate, Distress- none acute, +obese Skin- + eczema antecubital, calves Lymphadenopathy- none Head- atraumatic            Eyes- Gross  vision intact, PERRLA, conjunctivae and secretions clear            Ears- Hearing, canals-normal            Nose- Clear, no-Septal dev, mucus, polyps, erosion, perforation             Throat- Mallampati II , mucosa+red , +exudate L, drainage- none, tonsils- atrophic Neck- flexible , trachea midline, no stridor , thyroid nl, carotid no bruit Chest - symmetrical excursion , unlabored           Heart/CV- RRR , no murmur , no gallop  , no rub, nl s1 s2                           - JVD- none , edema- none, stasis changes- none, varices- none           Lung- + raspy laugh, diminished/ clear, wheeze-none, cough-none, dullness-none, rub- none           Chest wall-  Abd-  Br/ Gen/ Rectal- Not done, not indicated Extrem- cyanosis- none, clubbing, none, atrophy- none, strength- nl Neuro- grossly intact to observation

## 2022-11-19 DIAGNOSIS — Z1211 Encounter for screening for malignant neoplasm of colon: Secondary | ICD-10-CM | POA: Diagnosis not present

## 2022-11-19 DIAGNOSIS — Z1212 Encounter for screening for malignant neoplasm of rectum: Secondary | ICD-10-CM | POA: Diagnosis not present

## 2022-11-20 ENCOUNTER — Ambulatory Visit: Payer: Medicare HMO | Admitting: Internal Medicine

## 2022-11-20 ENCOUNTER — Encounter: Payer: Self-pay | Admitting: Internal Medicine

## 2022-11-20 VITALS — BP 138/74 | HR 78 | Ht 61.5 in | Wt 194.4 lb

## 2022-11-20 DIAGNOSIS — J449 Chronic obstructive pulmonary disease, unspecified: Secondary | ICD-10-CM

## 2022-11-20 DIAGNOSIS — G4733 Obstructive sleep apnea (adult) (pediatric): Secondary | ICD-10-CM

## 2022-11-20 NOTE — Patient Instructions (Signed)
Please call if we can help 

## 2022-11-26 NOTE — Progress Notes (Unsigned)
[name] [address] [place] [address] Phone: [phone] Subjective:   Bruce Donath, am serving as a scribe for Dr. Antoine Primas.  I'm seeing this patient by the request  of:  Myrlene Broker, MD  CC: Bilateral knee pain, back pain follow-up  [id number]  08/21/2022 Patient is proud of herself, down to 1 bottle of wine a week she states.  Has been doing this for over a year now.  Patient is having some peripheral neuropathy and B12 injection given today.  Patient is also having some difficulty with mentation so we will see if choline over-the-counter can be helpful.     Known arthritic changes but still seems to be stable.  Patient has lost some more weight at this moment encouraged her to continue to do so.  Does respond well to injections when needed though we will hold at this time and discuss again in follow-up in 3 months.   NSAID deficiency previously.  Continue the daily supplementation.      Update 11/27/2022 ADAN DECKARD is a 72 y.o. female coming in with complaint of B knee pain. Patient states that she is not ready for knee injections. She said that she is no better and not worse either. Used meloxicam for about 14 days when her pain flared up.   Supplementing with Bcomplex and coline this is making a difference.   States that the insurance company may not pay for dupixent.         Past Medical History:  Diagnosis Date   ADD (attention deficit disorder)    Adult acne    Alcohol abuse    Allergic rhinitis    Anxiety and depression    Asthma    Congenital pneumonia    Eczema    Hypoglycemia    no longer per pt    Hypothyroidism    Impaired fasting blood sugar    Mycoplasma pneumonia    Restless leg syndrome    Seasonal allergies    Stroke (HCC)    Urticaria    Past Surgical History:  Procedure Laterality Date   DILATION AND CURETTAGE OF UTERUS     @ 72 years old   TOTAL HIP ARTHROPLASTY Left  07/29/2018   Procedure: Left Anterior Hip Arthroplasty;  Surgeon: Marcene Corning, MD;  Location: [facility];  Service: Orthopedics;  Laterality: Left;   WISDOM TOOTH EXTRACTION     Social History   Socioeconomic History   Marital status: Widowed    Spouse name: has boyfriend Sam   Number of children: 2   Years of education: 17   Highest education level: Some college, no degree  Occupational History   Occupation: Product/process development scientist    Comment: Semi-retired  Tobacco Use   Smoking status: Former    Current packs/day: 0.00    Average packs/day: 2.0 packs/day for 15.0 years (30.0 ttl pk-yrs)    Types: Cigarettes    Start date: 52    Quit date: 1980    Years since quitting: 44.9   Smokeless tobacco: Never  Vaping Use   Vaping status: Former  Substance and Sexual Activity   Alcohol use: Yes    Alcohol/week: 0.0 standard drinks of alcohol    Comment: 3-5 glasses of wine day    Drug use: No   Sexual activity: Not on file  Other Topics Concern   Not on file  Social History Narrative   HSG, [employer] MA- photography  Married '73- 10 years divorced, married '86- 10 years divorced, Married '96- 10 years- widowed.   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   Social Determinants of Health   Financial Resource Strain: Low Risk  (01/18/2022)   Overall Financial Resource Strain (CARDIA)    Difficulty of Paying Living Expenses: Not hard at all  Food Insecurity: No Food Insecurity (01/18/2022)   Hunger Vital Sign    Worried About Running Out of Food in the Last Year: Never true    Ran Out of Food in the Last Year: Never true  Transportation Needs: No Transportation Needs (01/18/2022)   PRAPARE - Administrator, Civil Service (Medical): No    Lack of Transportation (Non-Medical): No  Physical Activity: Not on file  Stress: No Stress Concern Present (01/18/2022)   [employer] of Occupational  Health - Occupational Stress Questionnaire    Feeling of Stress : Not at all  Social Connections: Moderately Integrated (01/18/2022)   Social Connection and Isolation Panel [NHANES]    Frequency of Communication with Friends and Family: More than three times a week    Frequency of Social Gatherings with Friends and Family: More than three times a week    Attends Religious Services: More than 4 times per year    Active Member of [employer] or Organizations: Yes    Attends Banker Meetings: More than 4 times per year    Marital Status: Widowed   Allergies  Allergen Reactions   Astelin [Azelastine] Other (See Comments)    Per patient caused psychiatric illness   Nasonex [Mometasone] Other (See Comments)    Per patient caused psychiatric illness   Other     Can not eat carbohydrates without protein, is allergic to certain foods but can take in certain doses   Family History  Problem Relation Age of Onset   COPD Father    Heart failure Father    Coronary artery disease Father    Cancer Mother        breast   Hypertension Mother    Dementia Mother    Stroke Mother    Breast cancer Mother      Current Outpatient Medications (Cardiovascular):    amLODipine (NORVASC) 5 MG tablet, TAKE 1 TABLET BY MOUTH EVERY DAY   EPINEPHrine 0.3 mg/0.3 mL IJ SOAJ injection, Inject 0.3 mg into the muscle as needed for anaphylaxis.  Current Outpatient Medications (Respiratory):    albuterol (VENTOLIN HFA) 108 (90 Base) MCG/ACT inhaler, INHALE 1-2 PUFFS BY MOUTH EVERY 6 HOURS AS NEEDED FOR WHEEZE OR SHORTNESS OF BREATH   benzonatate (TESSALON) 200 MG capsule, Take 1 capsule (200 mg total) by mouth 3 (three) times daily as needed for cough.   Fluticasone-Umeclidin-Vilant (TRELEGY ELLIPTA) 100-62.5-25 MCG/ACT AEPB, Inhale 1 puff then rinse mouth, once daily   montelukast (SINGULAIR) 10 MG tablet, Take 1 tablet (10 mg total) by mouth at bedtime.    Current Outpatient Medications (Other):    b  complex vitamins tablet, Take 1 tablet by mouth 3 (three) times a week.   DULoxetine (CYMBALTA) 20 MG capsule, TAKE 1 CAPSULE BY MOUTH EVERY DAY   Dupilumab (DUPIXENT) 300 MG/2ML SOPN, Inject 300 mg into the skin every 14 (fourteen) days.   fluocinonide cream (LIDEX) 0.05 %, Apply 1 application topically 2 (two) times daily as needed.   hydrocortisone 2.5 % ointment, Apply topically 2 (two) times daily.   traZODone (DESYREL)  50 MG tablet, TAKE 0.5-1 TABLETS BY MOUTH AT BEDTIME AS NEEDED FOR SLEEP.   Vitamin D, Ergocalciferol, (DRISDOL) 1.25 MG (50000 UNIT) CAPS capsule, TAKE 1 CAPSULE (50,000 UNITS TOTAL) BY MOUTH EVERY 7 (SEVEN) DAYS   ZITHROMAX Z-PAK 250 MG tablet, Take 500 mg on day 1, followed by 250 mg once daily on days 2 to 5   Reviewed prior external information including notes and imaging from  primary care provider As well as notes that were available from care everywhere and other healthcare systems.  Past medical history, social, surgical and family history all reviewed in electronic medical record.  No pertanent information unless stated regarding to the chief complaint.   Review of Systems:  No headache, visual changes, nausea, vomiting, diarrhea, constipation, dizziness, abdominal pain, skin rash, fevers, chills, night sweats, weight loss, swollen lymph nodes, body aches, joint swelling, chest pain, shortness of breath, mood changes. POSITIVE muscle aches  Objective  Blood pressure 116/82, height 5' 1.5" (1.562 m), weight 193 lb (87.5 kg).   General: No apparent distress alert and oriented x3 mood and affect normal, dressed appropriately.  HEENT: Pupils equal, extraocular movements intact  Respiratory: Patient's speak in full sentences and does not appear short of breath  Cardiovascular: No lower extremity edema, non tender, no erythema  Mild antalgic gait noted.  Tenderness to palpation in the paraspinal musculature Of the knee exam does have still some mild crepitus  bilaterally but no significant swelling noted today.   Impression and Recommendations:    The above documentation has been reviewed and is accurate and complete Judi Saa, DO

## 2022-11-27 ENCOUNTER — Ambulatory Visit: Payer: Medicare HMO | Admitting: Family Medicine

## 2022-11-27 ENCOUNTER — Encounter: Payer: Self-pay | Admitting: Family Medicine

## 2022-11-27 VITALS — BP 116/82 | Ht 61.5 in | Wt 193.0 lb

## 2022-11-27 DIAGNOSIS — M170 Bilateral primary osteoarthritis of knee: Secondary | ICD-10-CM

## 2022-11-27 NOTE — Assessment & Plan Note (Signed)
Arthritic changes of the knees but seems to be holding strong at the moment.  Patient has been the primary caregiver for her ailing husband at the moment.  We discussed worsening symptoms another injection could be beneficial.  Patient wants to hold off that on this for the moment.  Discussed with her about different treatment options that are available if needed.  Patient does seem to do better with talking.  Follow-up with me again 3 months otherwise.  Total time with patient today 36 minutes

## 2022-11-27 NOTE — Patient Instructions (Signed)
Good to see you Be proud of where you are See me in 3 months

## 2022-11-29 ENCOUNTER — Other Ambulatory Visit: Payer: Self-pay | Admitting: Family Medicine

## 2022-11-29 LAB — COLOGUARD: COLOGUARD: POSITIVE — AB

## 2022-11-30 ENCOUNTER — Other Ambulatory Visit: Payer: Self-pay | Admitting: Internal Medicine

## 2022-11-30 DIAGNOSIS — R195 Other fecal abnormalities: Secondary | ICD-10-CM

## 2022-12-10 ENCOUNTER — Encounter: Payer: Self-pay | Admitting: Internal Medicine

## 2023-01-03 ENCOUNTER — Encounter: Payer: Self-pay | Admitting: Internal Medicine

## 2023-01-03 NOTE — Assessment & Plan Note (Signed)
-  Followed by Neurology. °

## 2023-01-03 NOTE — Assessment & Plan Note (Signed)
Asthmatic bronchitis- moderate chronic. Currently near baseline Plan- continue present meds

## 2023-01-14 ENCOUNTER — Other Ambulatory Visit: Payer: Self-pay | Admitting: Family Medicine

## 2023-01-16 ENCOUNTER — Ambulatory Visit: Payer: Medicare HMO

## 2023-01-16 VITALS — Ht 61.5 in | Wt 190.0 lb

## 2023-01-16 DIAGNOSIS — Z1211 Encounter for screening for malignant neoplasm of colon: Secondary | ICD-10-CM

## 2023-01-16 MED ORDER — SUTAB 1479-225-188 MG PO TABS: ORAL_TABLET | ORAL | 0 refills | Status: DC

## 2023-01-16 NOTE — Progress Notes (Signed)
 No egg or soy allergy known to patient  No issues known to pt with past sedation with any surgeries or procedures Patient denies ever being told they had issues or difficulty with intubation  No FH of Malignant Hyperthermia Pt is not on diet pills Pt is not on  home 02  Pt is not on blood thinners  Pt has some issues with constipation, no meds to treat this.  Extra Miralax for prep in addition to Suprep  Hx of Afib Have any cardiac testing pending--No Pt can ambulate  Pt denies use of chewing tobacco Discussed diabetic I weight loss medication holds Discussed NSAID holds Checked BMI Pt instructed to use Singlecare.com or GoodRx for a price reduction on prep  Patient's chart reviewed by Norleen Schillings CNRA prior to previsit and patient appropriate for the [facility].  Pre visit completed and red dot placed by patient's name on their procedure day (on provider's schedule).

## 2023-01-21 ENCOUNTER — Encounter: Payer: Self-pay | Admitting: Internal Medicine

## 2023-01-21 ENCOUNTER — Ambulatory Visit (INDEPENDENT_AMBULATORY_CARE_PROVIDER_SITE_OTHER): Payer: Medicare HMO | Admitting: Internal Medicine

## 2023-01-21 VITALS — BP 132/80 | HR 71 | Temp 98.9°F | Ht 61.5 in | Wt 196.0 lb

## 2023-01-21 DIAGNOSIS — R413 Other amnesia: Secondary | ICD-10-CM | POA: Insufficient documentation

## 2023-01-21 DIAGNOSIS — F1021 Alcohol dependence, in remission: Secondary | ICD-10-CM | POA: Diagnosis not present

## 2023-01-21 DIAGNOSIS — E118 Type 2 diabetes mellitus with unspecified complications: Secondary | ICD-10-CM

## 2023-01-21 NOTE — Patient Instructions (Signed)
 We will check the blood flow in the legs to make sure this is not the issue.

## 2023-01-21 NOTE — Progress Notes (Signed)
   Subjective:   Patient ID: Toni Hicks, female    DOB: 03-20-1950, 73 y.o.   MRN: [id number]  HPI The patient is a 73 YO female coming in for discomfort right ankle and knee. Feels more heavy and she is concerned about blood flow. Also twice recently the knee has given way on her. She is dealing with stress in life and terminal illness of partner. Some memory changes which are stable overall.   Review of Systems  Constitutional: Negative.   HENT: Negative.    Eyes: Negative.   Respiratory:  Negative for cough, chest tightness and shortness of breath.   Cardiovascular:  Negative for chest pain, palpitations and leg swelling.  Gastrointestinal:  Negative for abdominal distention, abdominal pain, constipation, diarrhea, nausea and vomiting.  Musculoskeletal:  Positive for arthralgias and myalgias.  Skin: Negative.   Neurological: Negative.   Psychiatric/Behavioral: Negative.      Objective:  Physical Exam Constitutional:      Appearance: She is well-developed.  HENT:     Head: Normocephalic and atraumatic.  Cardiovascular:     Rate and Rhythm: Normal rate and regular rhythm.  Pulmonary:     Effort: Pulmonary effort is normal. No respiratory distress.     Breath sounds: Normal breath sounds. No wheezing or rales.  Abdominal:     General: Bowel sounds are normal. There is no distension.     Palpations: Abdomen is soft.     Tenderness: There is no abdominal tenderness. There is no rebound.  Musculoskeletal:        General: Tenderness present.     Cervical back: Normal range of motion.  Skin:    General: Skin is warm and dry.     Comments: Palpable pulse DT left and right ankle  Neurological:     Mental Status: She is alert and oriented to person, place, and time.     Coordination: Coordination normal.     Vitals:   01/21/23 1002  BP: 132/80  Pulse: 71  Temp: 98.9 F (37.2 C)  TempSrc: Oral  SpO2: 98%  Weight: 196 lb (88.9 kg)  Height: 5' 1.5 (1.562 m)     Assessment & Plan:  Visit time 25 minutes in face to face communication with patient and coordination of care, additional 5 minutes spent in record review, coordination or care, ordering tests, communicating/referring to other healthcare professionals, documenting in medical records all on the same day of the visit for total time 30 minutes spent on the visit.

## 2023-01-21 NOTE — Assessment & Plan Note (Signed)
 Suspect some vascular etiology as well as alcohol overuse has possibly contributed some. Her prior brain imaging with past stroke and moderate for age loss of volume. She will consider if she wants repeat imaging  (this was several years ago). She is not on statin currently.

## 2023-01-21 NOTE — Assessment & Plan Note (Signed)
 BMI 36 and complicated by diabetes and osteoarthritis. She understands that her weight gain may have caused the worsening knee problems and is struggling with losing weight.

## 2023-01-21 NOTE — Assessment & Plan Note (Signed)
 She is now drinking about 1-2 drinks 5-6 times per week which is in line with her goals. She previously was drinking excessively most days of the week. She did abstain from alcohol for about 1 year and this got tiresome due to social constraints.

## 2023-01-21 NOTE — Assessment & Plan Note (Signed)
 Checking ABI given new symptoms of heaviness in the leg. She is not on statin due to lipids were good without and she wished to wait. If ABI with plaque build up she may be interested to start.

## 2023-02-01 ENCOUNTER — Encounter: Payer: Self-pay | Admitting: Internal Medicine

## 2023-02-03 ENCOUNTER — Encounter: Payer: Self-pay | Admitting: Certified Registered Nurse Anesthetist

## 2023-02-06 ENCOUNTER — Ambulatory Visit: Payer: Medicare HMO | Admitting: Internal Medicine

## 2023-02-06 ENCOUNTER — Encounter: Payer: Self-pay | Admitting: Internal Medicine

## 2023-02-06 VITALS — BP 134/82 | HR 60 | Temp 97.7°F | Resp 19 | Ht 61.5 in | Wt 190.0 lb

## 2023-02-06 DIAGNOSIS — R195 Other fecal abnormalities: Secondary | ICD-10-CM | POA: Diagnosis not present

## 2023-02-06 DIAGNOSIS — I10 Essential (primary) hypertension: Secondary | ICD-10-CM | POA: Diagnosis not present

## 2023-02-06 DIAGNOSIS — D124 Benign neoplasm of descending colon: Secondary | ICD-10-CM | POA: Diagnosis not present

## 2023-02-06 DIAGNOSIS — D123 Benign neoplasm of transverse colon: Secondary | ICD-10-CM

## 2023-02-06 DIAGNOSIS — Z1211 Encounter for screening for malignant neoplasm of colon: Secondary | ICD-10-CM

## 2023-02-06 DIAGNOSIS — D128 Benign neoplasm of rectum: Secondary | ICD-10-CM | POA: Diagnosis not present

## 2023-02-06 DIAGNOSIS — K635 Polyp of colon: Secondary | ICD-10-CM | POA: Diagnosis not present

## 2023-02-06 DIAGNOSIS — K5730 Diverticulosis of large intestine without perforation or abscess without bleeding: Secondary | ICD-10-CM | POA: Diagnosis not present

## 2023-02-06 DIAGNOSIS — J449 Chronic obstructive pulmonary disease, unspecified: Secondary | ICD-10-CM | POA: Diagnosis not present

## 2023-02-06 DIAGNOSIS — K648 Other hemorrhoids: Secondary | ICD-10-CM | POA: Diagnosis not present

## 2023-02-06 DIAGNOSIS — K621 Rectal polyp: Secondary | ICD-10-CM | POA: Diagnosis not present

## 2023-02-06 DIAGNOSIS — E039 Hypothyroidism, unspecified: Secondary | ICD-10-CM | POA: Diagnosis not present

## 2023-02-06 DIAGNOSIS — F418 Other specified anxiety disorders: Secondary | ICD-10-CM | POA: Diagnosis not present

## 2023-02-06 MED ORDER — SODIUM CHLORIDE 0.9 % IV SOLN: 500.0000 mL | INTRAVENOUS | Status: DC

## 2023-02-06 NOTE — Progress Notes (Signed)
Called to room to assist during endoscopic procedure.  Patient ID and intended procedure confirmed with present staff. Received instructions for my participation in the procedure from the performing physician.

## 2023-02-06 NOTE — Progress Notes (Signed)
Report given to PACU, vss

## 2023-02-06 NOTE — Op Note (Signed)
[facility] Patient Name: Toni Hicks Procedure Date: 02/06/2023 11:04 AM MRN: [id number] Endoscopist: Madelyn Brunner Butler , , [id number] Age: 73 Referring MD:  Date of Birth: [birth year] Gender: Female Account #: [id number] Procedure:                Colonoscopy Indications:              Positive Cologuard test Medicines:                Monitored Anesthesia Care Procedure:                Pre-Anesthesia Assessment:                           - Prior to the procedure, a History and Physical                            was performed, and patient medications and                            allergies were reviewed. The patient's tolerance of                            previous anesthesia was also reviewed. The risks                            and benefits of the procedure and the sedation                            options and risks were discussed with the patient.                            All questions were answered, and informed consent                            was obtained. Prior Anticoagulants: The patient has                            taken no anticoagulant or antiplatelet agents. ASA                            Grade Assessment: II - A patient with mild systemic                            disease. After reviewing the risks and benefits,                            the patient was deemed in satisfactory condition to                            undergo the procedure.                           After obtaining informed consent, the colonoscope  was passed under direct vision. Throughout the                            procedure, the patient's blood pressure, pulse, and                            oxygen saturations were monitored continuously. The                            Olympus Scope SN: [id number] was introduced through                            the anus and advanced to the the terminal ileum.                            The colonoscopy was performed  without difficulty.                            The patient tolerated the procedure well. The                            quality of the bowel preparation was good. The                            terminal ileum, ileocecal valve, appendiceal                            orifice, and rectum were photographed. Scope In: 11:21:10 AM Scope Out: 11:41:58 AM Scope Withdrawal Time: 0 hours 17 minutes 35 seconds  Total Procedure Duration: 0 hours 20 minutes 48 seconds  Findings:                 The terminal ileum appeared normal.                           Four sessile polyps were found in the rectum,                            descending colon and transverse colon. The polyps                            were 3 to 6 mm in size. These polyps were removed                            with a cold snare. Resection and retrieval were                            complete.                           A 12 mm polyp was found in the descending colon.                            The polyp was pedunculated. The polyp was  removed                            with a hot snare. Resection and retrieval were                            complete.                           A few diverticula were found in the sigmoid colon.                           Non-bleeding internal hemorrhoids were found during                            retroflexion. Complications:            No immediate complications. Estimated Blood Loss:     Estimated blood loss was minimal. Impression:               - The examined portion of the ileum was normal.                           - Four 3 to 6 mm polyps in the rectum, in the                            descending colon and in the transverse colon,                            removed with a cold snare. Resected and retrieved.                           - One 12 mm polyp in the descending colon, removed                            with a hot snare. Resected and retrieved.                           - Diverticulosis  in the sigmoid colon.                           - Non-bleeding internal hemorrhoids. Recommendation:           - Discharge patient to home (with escort).                           - Await pathology results.                           - The findings and recommendations were discussed                            with the patient. Dr Particia Lather "[name]" Leonides Schanz,  02/06/2023 11:47:05 AM

## 2023-02-06 NOTE — Patient Instructions (Signed)
-  Handout on polyps, diverticulosis and hemorrhoids provided -await pathology results -repeat colonoscopy for surveillance recommended. Date to be determined when pathology result become available   -Continue present medications   YOU HAD AN ENDOSCOPIC PROCEDURE TODAY AT THE [facility]:   Refer to the procedure report that was given to you for any specific questions about what was found during the examination.  If the procedure report does not answer your questions, please call your gastroenterologist to clarify.  If you requested that your care partner not be given the details of your procedure findings, then the procedure report has been included in a sealed envelope for you to review at your convenience later.  YOU SHOULD EXPECT: Some feelings of bloating in the abdomen. Passage of more gas than usual.  Walking can help get rid of the air that was put into your GI tract during the procedure and reduce the bloating. If you had a lower endoscopy (such as a colonoscopy or flexible sigmoidoscopy) you may notice spotting of blood in your stool or on the toilet paper. If you underwent a bowel prep for your procedure, you may not have a normal bowel movement for a few days.  Please Note:  You might notice some irritation and congestion in your nose or some drainage.  This is from the oxygen used during your procedure.  There is no need for concern and it should clear up in a day or so.  SYMPTOMS TO REPORT IMMEDIATELY:  Following lower endoscopy (colonoscopy or flexible sigmoidoscopy):  Excessive amounts of blood in the stool  Significant tenderness or worsening of abdominal pains  Swelling of the abdomen that is new, acute  Fever of 100F or higher   For urgent or emergent issues, a gastroenterologist can be reached at any hour by calling [phone]. Do not use MyChart messaging for urgent concerns.    DIET:  We do recommend a small meal at first, but then you may proceed to your  regular diet.  Drink plenty of fluids but you should avoid alcoholic beverages for 24 hours.  ACTIVITY:  You should plan to take it easy for the rest of today and you should NOT DRIVE or use heavy machinery until tomorrow (because of the sedation medicines used during the test).    FOLLOW UP: Our staff will call the number listed on your records the next business day following your procedure.  We will call around 7:15- 8:00 am to check on you and address any questions or concerns that you may have regarding the information given to you following your procedure. If we do not reach you, we will leave a message.     If any biopsies were taken you will be contacted by phone or by letter within the next 1-3 weeks.  Please call us at [phone] if you have not heard about the biopsies in 3 weeks.    SIGNATURES/CONFIDENTIALITY: You and/or your care partner have signed paperwork which will be entered into your electronic medical record.  These signatures attest to the fact that that the information above on your After Visit Summary has been reviewed and is understood.  Full responsibility of the confidentiality of this discharge information lies with you and/or your care-partner.

## 2023-02-06 NOTE — Progress Notes (Signed)
GASTROENTEROLOGY PROCEDURE H&P NOTE   Primary Care Physician: Myrlene Broker, MD    Reason for Procedure:   Positive Cologuard  Plan:    Colonoscopy  Patient is appropriate for endoscopic procedure(s) in the ambulatory ([facility]) setting.  The nature of the procedure, as well as the risks, benefits, and alternatives were carefully and thoroughly reviewed with the patient. Ample time for discussion and questions allowed. The patient understood, was satisfied, and agreed to proceed.     HPI: Toni Hicks is a 73 y.o. female who presents for colonoscopy for positive Cologuard. Has seen scant amounts of rectal bleeding for years. Stools have been constipated at times. Denies unintentional weight loss. Denies family history of colon cancer.   Past Medical History:  Diagnosis Date   ADD (attention deficit disorder)    Adult acne    Alcohol abuse    Allergic rhinitis    Anxiety and depression    Arthritis    Asthma    Congenital pneumonia    COPD (chronic obstructive pulmonary disease) (HCC)    Eczema    Hypertension    Hypothyroidism    Impaired fasting blood sugar    Mycoplasma pneumonia    Restless leg syndrome    Seasonal allergies    Sleep apnea    Stroke (HCC)    Urticaria     Past Surgical History:  Procedure Laterality Date   DILATION AND CURETTAGE OF UTERUS     @ 73 years old   TOTAL HIP ARTHROPLASTY Left 07/29/2018   Procedure: Left Anterior Hip Arthroplasty;  Surgeon: Marcene Corning, MD;  Location: [facility];  Service: Orthopedics;  Laterality: Left;   WISDOM TOOTH EXTRACTION      Prior to Admission medications   Medication Sig Start Date End Date Taking? Authorizing Provider  albuterol (VENTOLIN HFA) 108 (90 Base) MCG/ACT inhaler INHALE 1-2 PUFFS BY MOUTH EVERY 6 HOURS AS NEEDED FOR WHEEZE OR SHORTNESS OF BREATH 11/16/22  Yes Mannam, Praveen, MD  amLODipine (NORVASC) 5 MG tablet TAKE 1 TABLET BY MOUTH EVERY DAY 05/28/22  Yes Myrlene Broker, MD   b complex vitamins tablet Take 1 tablet by mouth 3 (three) times a week.   Yes [provider]  DULoxetine (CYMBALTA) 20 MG capsule TAKE 1 CAPSULE BY MOUTH EVERY DAY 01/15/23  Yes Antoine Primas M, DO  Dupilumab (DUPIXENT) 300 MG/2ML SOPN Inject 300 mg into the skin every 14 (fourteen) days. 04/04/22  Yes Young, Joni Fears D, MD  fluocinonide cream (LIDEX) 0.05 % Apply 1 application topically 2 (two) times daily as needed. 12/30/19  Yes Padgett, Pilar Grammes, MD  Fluticasone-Umeclidin-Vilant (TRELEGY ELLIPTA) 100-62.5-25 MCG/ACT AEPB Inhale 1 puff then rinse mouth, once daily 05/21/22  Yes Young, Clinton D, MD  montelukast (SINGULAIR) 10 MG tablet Take 1 tablet (10 mg total) by mouth at bedtime. 05/21/22  Yes Jetty Duhamel D, MD  Sodium Sulfate-Mag Sulfate-KCl (SUTAB) [phone] MG TABS Take 12 tablets as directed the evening before your colonoscopy.  Take 12 tablets as directed the day of your colonoscopy.. 01/16/23  Yes Imogene Burn, MD  EPINEPHrine 0.3 mg/0.3 mL IJ SOAJ injection Inject 0.3 mg into the muscle as needed for anaphylaxis. 04/27/20   Jetty Duhamel D, MD  hydrocortisone 2.5 % ointment Apply topically 2 (two) times daily. 02/12/20   Marcelyn Bruins, MD  meloxicam (MOBIC) 7.5 MG tablet Take 7.5 mg by mouth daily. 01/14/23   [provider]  traZODone (DESYREL) 50 MG tablet TAKE 0.5-1  TABLETS BY MOUTH AT BEDTIME AS NEEDED FOR SLEEP. 05/16/22   Dohmeier, Porfirio Mylar, MD  Vitamin D, Ergocalciferol, (DRISDOL) 1.25 MG (50000 UNIT) CAPS capsule TAKE 1 CAPSULE (50,000 UNITS TOTAL) BY MOUTH EVERY 7 (SEVEN) DAYS 10/31/20   Judi Saa, DO    Current Outpatient Medications  Medication Sig Dispense Refill   albuterol (VENTOLIN HFA) 108 (90 Base) MCG/ACT inhaler INHALE 1-2 PUFFS BY MOUTH EVERY 6 HOURS AS NEEDED FOR WHEEZE OR SHORTNESS OF BREATH 36 each 3   amLODipine (NORVASC) 5 MG tablet TAKE 1 TABLET BY MOUTH EVERY DAY 90 tablet 3   b complex vitamins tablet Take 1 tablet  by mouth 3 (three) times a week.     DULoxetine (CYMBALTA) 20 MG capsule TAKE 1 CAPSULE BY MOUTH EVERY DAY 90 capsule 0   Dupilumab (DUPIXENT) 300 MG/2ML SOPN Inject 300 mg into the skin every 14 (fourteen) days. 12 mL 1   fluocinonide cream (LIDEX) 0.05 % Apply 1 application topically 2 (two) times daily as needed. 30 g 5   Fluticasone-Umeclidin-Vilant (TRELEGY ELLIPTA) 100-62.5-25 MCG/ACT AEPB Inhale 1 puff then rinse mouth, once daily 180 each 4   montelukast (SINGULAIR) 10 MG tablet Take 1 tablet (10 mg total) by mouth at bedtime. 90 tablet 3   Sodium Sulfate-Mag Sulfate-KCl (SUTAB) [phone] MG TABS Take 12 tablets as directed the evening before your colonoscopy.  Take 12 tablets as directed the day of your colonoscopy.. 24 tablet 0   EPINEPHrine 0.3 mg/0.3 mL IJ SOAJ injection Inject 0.3 mg into the muscle as needed for anaphylaxis. 1 each 2   hydrocortisone 2.5 % ointment Apply topically 2 (two) times daily. 60 g 5   meloxicam (MOBIC) 7.5 MG tablet Take 7.5 mg by mouth daily.     traZODone (DESYREL) 50 MG tablet TAKE 0.5-1 TABLETS BY MOUTH AT BEDTIME AS NEEDED FOR SLEEP. 90 tablet 2   Vitamin D, Ergocalciferol, (DRISDOL) 1.25 MG (50000 UNIT) CAPS capsule TAKE 1 CAPSULE (50,000 UNITS TOTAL) BY MOUTH EVERY 7 (SEVEN) DAYS 12 capsule 0   Current Facility-Administered Medications  Medication Dose Route Frequency Provider Last Rate Last Admin   0.9 %  sodium chloride infusion  500 mL Intravenous Continuous Imogene Burn, MD        Allergies as of 02/06/2023 - Review Complete 02/06/2023  Allergen Reaction Noted   Astelin [azelastine] Other (See Comments) 09/16/2019   Nasonex [mometasone] Other (See Comments) 09/16/2019   Other  03/05/2014    Family History  Problem Relation Age of Onset   Cancer Mother        breast   Hypertension Mother    Dementia Mother    Stroke Mother    Breast cancer Mother    COPD Father    Heart failure Father    Coronary artery disease Father    Colon  cancer Neg Hx    Rectal cancer Neg Hx    Stomach cancer Neg Hx    Esophageal cancer Neg Hx     Social History   Socioeconomic History   Marital status: Widowed    Spouse name: has boyfriend Sam   Number of children: 2   Years of education: 17   Highest education level: Some college, no degree  Occupational History   Occupation: Product/process development scientist    Comment: Semi-retired  Tobacco Use   Smoking status: Former    Current packs/day: 0.00    Average packs/day: 2.0 packs/day for 15.0 years (30.0 ttl pk-yrs)    Types:  Cigarettes    Start date: 73    Quit date: 16    Years since quitting: 45.1   Smokeless tobacco: Never  Vaping Use   Vaping status: Former  Substance and Sexual Activity   Alcohol use: Yes    Comment: Maybe 1 glass / day   Drug use: No   Sexual activity: Not on file  Other Topics Concern   Not on file  Social History Narrative   [employer], [employer] MA- photography   Married '73- 10 years divorced, married '86- 10 years divorced, Married '96- 10 years- widowed.   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   Social Drivers of Corporate investment banker Strain: Low Risk  (01/18/2022)   Overall Financial Resource Strain (CARDIA)    Difficulty of Paying Living Expenses: Not hard at all  Food Insecurity: No Food Insecurity (01/18/2022)   Hunger Vital Sign    Worried About Running Out of Food in the Last Year: Never true    Ran Out of Food in the Last Year: Never true  Transportation Needs: No Transportation Needs (01/18/2022)   PRAPARE - Administrator, Civil Service (Medical): No    Lack of Transportation (Non-Medical): No  Physical Activity: Not on file  Stress: No Stress Concern Present (01/18/2022)   [employer] of Occupational Health - Occupational Stress Questionnaire    Feeling of Stress : Not at all  Social Connections: Moderately Integrated (01/18/2022)   Social  Connection and Isolation Panel [NHANES]    Frequency of Communication with Friends and Family: More than three times a week    Frequency of Social Gatherings with Friends and Family: More than three times a week    Attends Religious Services: More than 4 times per year    Active Member of [employer] or Organizations: Yes    Attends Banker Meetings: More than 4 times per year    Marital Status: Widowed  Intimate Partner Violence: Not At Risk (01/18/2022)   Humiliation, Afraid, Rape, and Kick questionnaire    Fear of Current or Ex-Partner: No    Emotionally Abused: No    Physically Abused: No    Sexually Abused: No    Physical Exam: Vital signs in last 24 hours: BP 133/77   Pulse 71   Temp 97.7 F (36.5 C)   Ht 5' 1.5" (1.562 m)   Wt 190 lb (86.2 kg)   SpO2 94%   BMI 35.32 kg/m  GEN: NAD EYE: Sclerae anicteric ENT: MMM CV: Non-tachycardic Pulm: No increased work of breathing GI: Soft, NT/ND NEURO:  Alert & Oriented   Eulah Pont, MD [facility]  02/06/2023 11:04 AM

## 2023-02-07 ENCOUNTER — Telehealth: Payer: Self-pay

## 2023-02-07 NOTE — Telephone Encounter (Signed)
  Follow up Call-     02/06/2023   10:24 AM  Call back number  Post procedure Call Back phone  # [phone]  Permission to leave phone message Yes     Patient questions:  Do you have a fever, pain , or abdominal swelling? No. Pain Score  0 *  Have you tolerated food without any problems? Yes.    Have you been able to return to your normal activities? Yes.    Do you have any questions about your discharge instructions: Diet   No. Medications  No. Follow up visit  No.  Do you have questions or concerns about your Care? No.  Actions: * If pain score is 4 or above: No action needed, pain <4.

## 2023-02-08 ENCOUNTER — Ambulatory Visit (HOSPITAL_COMMUNITY): Admission: RE | Admit: 2023-02-08 | Payer: Medicare HMO | Source: Ambulatory Visit

## 2023-02-08 ENCOUNTER — Encounter: Payer: Self-pay | Admitting: Internal Medicine

## 2023-02-08 LAB — SURGICAL PATHOLOGY

## 2023-02-18 ENCOUNTER — Telehealth: Payer: Medicare HMO | Admitting: Physician Assistant

## 2023-02-18 ENCOUNTER — Ambulatory Visit: Payer: Self-pay | Admitting: Internal Medicine

## 2023-02-18 DIAGNOSIS — R112 Nausea with vomiting, unspecified: Secondary | ICD-10-CM

## 2023-02-18 DIAGNOSIS — R197 Diarrhea, unspecified: Secondary | ICD-10-CM | POA: Diagnosis not present

## 2023-02-18 NOTE — Telephone Encounter (Signed)
 Copied from CRM [id number]. Topic: Clinical - Red Word Triage >> Feb 18, 2023  3:07 PM Marlan Silva wrote: Red Word that prompted transfer to Nurse Triage: Patient called in stating that she got really sick for 24 hours with symptoms of vomiting and diarrhea. Patient states that it has subsided after 35-40 to just diarrhea and feeling very weak. Patient is stating that she also feels very stiff, she think she had a norovirus. She said she usually baby sits her granddaughter, she wants to know when she can be around her again.  Chief Complaint: diarrhea Symptoms: tired, n/v, diarrhea-loose black stools, chills, hot flashes, body aches Frequency: Friday morning Pertinent Negatives: Patient denies fever Disposition: [] ED /[] Urgent Care (no appt availability in office) / [] Appointment(In office/virtual)/ [x]  [facility]/ [] Home Care/ [] Refused Recommended Disposition /[] [facility]/ []  Follow-up with PCP Additional Notes: pt stated diarrhea was so bad she had to wear a diaper. Pt takes care of her granddaughter in the afternoon and would like to know how long should she distance herself from her and if she needs any ABX.  Reason for Disposition  [1] MODERATE diarrhea (e.g., 4-6 times / day more than normal) AND [2] present > 48 hours (2 days)  Answer Assessment - Initial Assessment Questions 1. DIARRHEA SEVERITY: "How bad is the diarrhea?" "How many more stools have you had in the past 24 hours than normal?"    - NO DIARRHEA (SCALE 0)   - MILD (SCALE 1-3): Few loose or mushy BMs; increase of 1-3 stools over normal daily number of stools; mild increase in ostomy output.   -  MODERATE (SCALE 4-7): Increase of 4-6 stools daily over normal; moderate increase in ostomy output.   -  SEVERE (SCALE 8-10; OR "WORST POSSIBLE"): Increase of 7 or more stools daily over normal; moderate increase in ostomy output; incontinence.     Severe but none now 2. ONSET: "When did the diarrhea begin?"       Friday morning 3. BM CONSISTENCY: "How loose or watery is the diarrhea?"       Black soft bowel - had to wear a diaper  4. VOMITING: "Are you also vomiting?" If Yes, ask: "How many times in the past 24 hours?"      none 5. ABDOMEN PAIN: "Are you having any abdomen pain?" If Yes, ask: "What does it feel like?" (e.g., crampy, dull, intermittent, constant)      None  6. ABDOMEN PAIN SEVERITY: If present, ask: "How bad is the pain?"  (e.g., Scale 1-10; mild, moderate, or severe)   - MILD (1-3): doesn't interfere with normal activities, abdomen soft and not tender to touch    - MODERATE (4-7): interferes with normal activities or awakens from sleep, abdomen tender to touch    - SEVERE (8-10): excruciating pain, doubled over, unable to do any normal activities       7/10 abd cramping right and left lower  7. ORAL INTAKE: If vomiting, "Have you been able to drink liquids?" "How much liquids have you had in the past 24 hours?"     yes 8. HYDRATION: "Any signs of dehydration?" (e.g., dry mouth [not just dry lips], too weak to stand, dizziness, new weight loss) "When did you last urinate?"     Keeping hydrated, eating jelly=, veggie and potato soup 9. EXPOSURE: "Have you traveled to a foreign country recently?" "Have you been exposed to anyone with diarrhea?" "Could you have eaten any food that was spoiled?"  no 10. ANTIBIOTIC USE: "Are you taking antibiotics now or have you taken antibiotics in the past 2 months?"       no 11. OTHER SYMPTOMS: "Do you have any other symptoms?" (e.g., fever, blood in stool)       Chills, body aches,  12. PREGNANCY: "Is there any chance you are pregnant?" "When was your last menstrual period?"       N/a  Protocols used: [facility]

## 2023-02-18 NOTE — Patient Instructions (Signed)
 Toni Hicks, thank you for joining Malcom Scriver, PA-C for today's virtual visit.  While this provider is not your primary care provider (PCP), if your PCP is located in our provider database this encounter information will be shared with them immediately following your visit.   A [facility] MyChart account gives you access to today's visit and all your visits, tests, and labs performed at [facility] " click here if you don't have a [facility] MyChart account or go to [email or web address]  Consent: (Patient) Toni Hicks provided verbal consent for this virtual visit at the beginning of the encounter.  Current Medications:  Current Outpatient Medications:    albuterol  (VENTOLIN  HFA) 108 (90 Base) MCG/ACT inhaler, INHALE 1-2 PUFFS BY MOUTH EVERY 6 HOURS AS NEEDED FOR WHEEZE OR SHORTNESS OF BREATH, Disp: 36 each, Rfl: 3   amLODipine  (NORVASC ) 5 MG tablet, TAKE 1 TABLET BY MOUTH EVERY DAY, Disp: 90 tablet, Rfl: 3   b complex vitamins tablet, Take 1 tablet by mouth 3 (three) times a week., Disp: , Rfl:    DULoxetine  (CYMBALTA ) 20 MG capsule, TAKE 1 CAPSULE BY MOUTH EVERY DAY, Disp: 90 capsule, Rfl: 0   Dupilumab  (DUPIXENT ) 300 MG/2ML SOPN, Inject 300 mg into the skin every 14 (fourteen) days., Disp: 12 mL, Rfl: 1   EPINEPHrine  0.3 mg/0.3 mL IJ SOAJ injection, Inject 0.3 mg into the muscle as needed for anaphylaxis., Disp: 1 each, Rfl: 2   fluocinonide  cream (LIDEX ) 0.05 %, Apply 1 application topically 2 (two) times daily as needed., Disp: 30 g, Rfl: 5   Fluticasone -Umeclidin-Vilant (TRELEGY ELLIPTA ) 100-62.5-25 MCG/ACT AEPB, Inhale 1 puff then rinse mouth, once daily, Disp: 180 each, Rfl: 4   hydrocortisone  2.5 % ointment, Apply topically 2 (two) times daily., Disp: 60 g, Rfl: 5   meloxicam  (MOBIC ) 7.5 MG tablet, Take 7.5 mg by mouth daily., Disp: , Rfl:    montelukast  (SINGULAIR ) 10 MG tablet, Take 1 tablet (10 mg total) by mouth at bedtime., Disp: 90 tablet, Rfl: 3    Sodium Sulfate-Mag Sulfate-KCl (SUTAB ) 250 [id number] [id number] MG TABS, Take 12 tablets as directed the evening before your colonoscopy.  Take 12 tablets as directed the day of your colonoscopy.., Disp: 24 tablet, Rfl: 0   traZODone  (DESYREL ) 50 MG tablet, TAKE 0.5-1 TABLETS BY MOUTH AT BEDTIME AS NEEDED FOR SLEEP., Disp: 90 tablet, Rfl: 2   Vitamin D , Ergocalciferol , (DRISDOL ) 1.25 MG (50000 UNIT) CAPS capsule, TAKE 1 CAPSULE (50,000 UNITS TOTAL) BY MOUTH EVERY 7 (SEVEN) DAYS, Disp: 12 capsule, Rfl: 0   Medications ordered in this encounter:  No orders of the defined types were placed in this encounter.    *If you need refills on other medications prior to your next appointment, please contact your pharmacy*  Follow-Up: Call back or seek an in-person evaluation if the symptoms worsen or if the condition fails to improve as anticipated.  [facility] [phone]  Other Instructions Diarrhea, Adult Diarrhea is frequent loose and sometimes watery bowel movements. Diarrhea can make you feel weak and cause you to become dehydrated. Dehydration is a condition in which there is not enough water  or other fluids in the body. Dehydration can make you tired and thirsty, cause you to have a dry mouth, and decrease how often you urinate. Diarrhea typically lasts 2-3 days. However, it can last longer if it is a sign of something more serious. It is important to treat your diarrhea as told by your health care provider. Follow  these instructions at home: Eating and drinking     Follow these recommendations as told by your health care provider: Take an oral rehydration solution (ORS). This is an over-the-counter medicine that helps return your body to its normal balance of nutrients and water . It is found at pharmacies and retail stores. Drink enough fluid to keep your urine pale yellow. Drink fluids such as water , diluted fruit juice, and low-calorie sports drinks. You can drink milk also, if  desired. Sucking on ice chips is another way to get fluids. Avoid drinking fluids that contain a lot of sugar or caffeine, such as soda, energy drinks, and regular sports drinks. Avoid alcohol. Eat bland, easy-to-digest foods in small amounts as you are able. These foods include bananas, applesauce, rice, lean meats, toast, and crackers. Avoid spicy or fatty foods.  Medicines Take over-the-counter and prescription medicines only as told by your health care provider. If you were prescribed antibiotics, take them as told by your health care provider. Do not stop using the antibiotic even if you start to feel better. General instructions  Wash your hands often using soap and water  for at least 20 seconds. If soap and water  are not available, use hand sanitizer. Others in the household should wash their hands as well. Hands should be washed: After using the toilet or changing a diaper. Before preparing, cooking, or serving food. While caring for a sick person or while visiting someone in a hospital. Rest at home while you recover. Take a warm bath to relieve any burning or pain from frequent diarrhea episodes. Watch your condition for any changes. Contact a health care provider if: You have a fever. Your diarrhea gets worse. You have new symptoms. You vomit every time you eat or drink. You feel light-headed, dizzy, or have a headache. You have muscle cramps. You have signs of dehydration, such as: Dark urine, very little urine, or no urine. Cracked lips. Dry mouth. Sunken eyes. Sleepiness. Weakness. You have bloody or black stools or stools that look like tar. You have severe pain, cramping, or bloating in your abdomen. Your skin feels cold and clammy. You feel confused. Get help right away if: You have chest pain or your heart is beating very quickly. You have trouble breathing or you are breathing very quickly. You feel extremely weak or you faint. These symptoms may be an  emergency. Get help right away. Call 911. Do not wait to see if the symptoms will go away. Do not drive yourself to the hospital. This information is not intended to replace advice given to you by your health care provider. Make sure you discuss any questions you have with your health care provider. Document Revised: 06/13/2021 Document Reviewed: 06/13/2021 Elsevier Patient Education  2024 Elsevier Inc.   If you have been instructed to have an in-person evaluation today at a local Urgent Care facility, please use the link below. It will take you to a list of all of our available [facility], including address, phone number and hours of operation. Please do not delay care.  [facility]  If you or a family member do not have a primary care provider, use the link below to schedule a visit and establish care. When you choose a [facility] primary care physician or advanced practice provider, you gain a long-term partner in health. Find a Primary Care Provider  Learn more about 's in-office and virtual care options:  - Get Care Now

## 2023-02-18 NOTE — Progress Notes (Signed)
 Virtual Visit Consent   Toni Hicks, you are scheduled for a virtual visit with a [facility] provider today. Just as with appointments in the office, your consent must be obtained to participate. Your consent will be active for this visit and any virtual visit you may have with one of our providers in the next 365 days. If you have a MyChart account, a copy of this consent can be sent to you electronically.  As this is a virtual visit, video technology does not allow for your provider to perform a traditional examination. This may limit your provider's ability to fully assess your condition. If your provider identifies any concerns that need to be evaluated in person or the need to arrange testing (such as labs, EKG, etc.), we will make arrangements to do so. Although advances in technology are sophisticated, we cannot ensure that it will always work on either your end or our end. If the connection with a video visit is poor, the visit may have to be switched to a telephone visit. With either a video or telephone visit, we are not always able to ensure that we have a secure connection.  By engaging in this virtual visit, you consent to the provision of healthcare and authorize for your insurance to be billed (if applicable) for the services provided during this visit. Depending on your insurance coverage, you may receive a charge related to this service.  I need to obtain your verbal consent now. Are you willing to proceed with your visit today? Toni Hicks has provided verbal consent on 02/18/2023 for a virtual visit (video or telephone). Malcom Scriver, PA-C  Date: 02/18/2023 5:28 PM   Virtual Visit via Video Note   I, Toni Hicks, connected with  Toni Hicks  ([id number], 07-15-1950) on 02/18/23 at  5:15 PM EST by a video-enabled telemedicine application and verified that I am speaking with the correct person using two identifiers.  Location: Patient: Virtual Visit Location Patient:  Home Provider: Virtual Visit Location Provider: Home Office   I discussed the limitations of evaluation and management by telemedicine and the availability of in person appointments. The patient expressed understanding and agreed to proceed.    History of Present Illness: Toni Hicks is a 73 y.o. who identifies as a female who was assigned female at birth, presents with a 24-hour history of simultaneous diarrhea and vomiting that started on Friday morning. The vomiting ceased after 24 hours, but the diarrhea persisted. The patient describes the diarrhea as black water  and black stool. She has been hydrating with clear liquids, water , and clear soda with no sugar. She has also consumed Jell-O, vegetable soup, and potato soup. The patient reports feeling  weak and has only just started wearing clothes and sitting up again. She has not vomited since the initial 24-hour period but has had feelings of nausea. The patient suspects she contracted the illness from her granddaughter's daycare. Problems:  Patient Active Problem List   Diagnosis Date Noted   Memory change 01/21/2023   Left leg cellulitis 08/05/2022   Class 3 severe obesity due to excess calories with serious comorbidity in adult [facility]) 05/16/2022   Tinnitus of both ears 12/04/2021   COPD mixed type (HCC) 03/20/2021   Diabetes mellitus type 2 with complications (HCC) 01/20/2021   Eczema 12/09/2019   Vitamin D  deficiency 09/11/2019   Degenerative arthritis of knee, bilateral 08/06/2019   Degenerative disc disease, lumbar 08/06/2019   Morbid obesity (HCC) 08/06/2019  Primary osteoarthritis of left hip 07/29/2018   History of completed stroke 02/12/2018   Moderate persistent asthma, uncomplicated 01/29/2018   Nasal polyposis 01/29/2018   Nasal septal perforation 01/29/2018   Anxiety 01/21/2018   Arthritis of left hip 01/14/2018   Obstructive sleep apnea treated with continuous positive airway pressure (CPAP) 09/18/2017   Insomnia  09/18/2017   Small vessel disease, cerebrovascular 09/17/2017   RLS (restless legs syndrome) 03/11/2017   Alcohol dependence in remission (HCC) 03/11/2017   Stress incontinence 11/13/2016   Encounter for general adult medical examination with abnormal findings 12/10/2010   Essential hypertension 11/14/2007   Allergic rhinitis due to allergen 11/14/2007    Allergies:  Allergies  Allergen Reactions   Astelin  [Azelastine ] Other (See Comments)    Per patient caused psychiatric illness   Nasonex  [Mometasone ] Other (See Comments)    Per patient caused psychiatric illness   Other     Can not eat carbohydrates without protein, is allergic to certain foods but can take in certain doses   Medications:  Current Outpatient Medications:    albuterol  (VENTOLIN  HFA) 108 (90 Base) MCG/ACT inhaler, INHALE 1-2 PUFFS BY MOUTH EVERY 6 HOURS AS NEEDED FOR WHEEZE OR SHORTNESS OF BREATH, Disp: 36 each, Rfl: 3   amLODipine  (NORVASC ) 5 MG tablet, TAKE 1 TABLET BY MOUTH EVERY DAY, Disp: 90 tablet, Rfl: 3   b complex vitamins tablet, Take 1 tablet by mouth 3 (three) times a week., Disp: , Rfl:    DULoxetine  (CYMBALTA ) 20 MG capsule, TAKE 1 CAPSULE BY MOUTH EVERY DAY, Disp: 90 capsule, Rfl: 0   Dupilumab  (DUPIXENT ) 300 MG/2ML SOPN, Inject 300 mg into the skin every 14 (fourteen) days., Disp: 12 mL, Rfl: 1   EPINEPHrine  0.3 mg/0.3 mL IJ SOAJ injection, Inject 0.3 mg into the muscle as needed for anaphylaxis., Disp: 1 each, Rfl: 2   fluocinonide  cream (LIDEX ) 0.05 %, Apply 1 application topically 2 (two) times daily as needed., Disp: 30 g, Rfl: 5   Fluticasone -Umeclidin-Vilant (TRELEGY ELLIPTA ) 100-62.5-25 MCG/ACT AEPB, Inhale 1 puff then rinse mouth, once daily, Disp: 180 each, Rfl: 4   hydrocortisone  2.5 % ointment, Apply topically 2 (two) times daily., Disp: 60 g, Rfl: 5   meloxicam  (MOBIC ) 7.5 MG tablet, Take 7.5 mg by mouth daily., Disp: , Rfl:    montelukast  (SINGULAIR ) 10 MG tablet, Take 1 tablet (10 mg  total) by mouth at bedtime., Disp: 90 tablet, Rfl: 3   Sodium Sulfate-Mag Sulfate-KCl (SUTAB ) [phone] MG TABS, Take 12 tablets as directed the evening before your colonoscopy.  Take 12 tablets as directed the day of your colonoscopy.., Disp: 24 tablet, Rfl: 0   traZODone  (DESYREL ) 50 MG tablet, TAKE 0.5-1 TABLETS BY MOUTH AT BEDTIME AS NEEDED FOR SLEEP., Disp: 90 tablet, Rfl: 2   Vitamin D , Ergocalciferol , (DRISDOL ) 1.25 MG (50000 UNIT) CAPS capsule, TAKE 1 CAPSULE (50,000 UNITS TOTAL) BY MOUTH EVERY 7 (SEVEN) DAYS, Disp: 12 capsule, Rfl: 0  Observations/Objective: Patient is well-developed, well-nourished in no acute distress.  Resting comfortably  at home.  Head is normocephalic, atraumatic.  No labored breathing.  Speech is clear and coherent with logical content.  Patient is alert and oriented at baseline.    Assessment and Plan: 1. Nausea and vomiting, unspecified vomiting type (Primary)  2. Diarrhea, unspecified type  Gastroenteritis Recent onset of vomiting and diarrhea, likely viral in origin. Symptoms have improved with vomiting ceasing and diarrhea lessening. Patient has been able to maintain hydration and has begun to reintroduce solid  foods. -Continue clear liquids and gradually reintroduce solid foods as tolerated. -Continue to monitor symptoms and seek in-person care if symptoms worsen or do not continue to improve.    Follow Up Instructions: I discussed the assessment and treatment plan with the patient. The patient was provided an opportunity to ask questions and all were answered. The patient agreed with the plan and demonstrated an understanding of the instructions.  A copy of instructions were sent to the patient via MyChart unless otherwise noted below.     The patient was advised to call back or seek an in-person evaluation if the symptoms worsen or if the condition fails to improve as anticipated.    Etter Hermann Mayers, PA-C

## 2023-02-19 NOTE — Telephone Encounter (Signed)
Called patient back and was able to go over advice from dr Toni Hicks. Patient verbalized she understood and there were no question or concerns

## 2023-02-19 NOTE — Telephone Encounter (Signed)
Once diarrhea is stopped and no fever for 48 hours not contagious. Make sure to drink plenty of fluids. Antibiotics are not used for viruses so this is not needed.

## 2023-02-26 ENCOUNTER — Ambulatory Visit (HOSPITAL_COMMUNITY)
Admission: RE | Admit: 2023-02-26 | Discharge: 2023-02-26 | Disposition: A | Discharge status: Home / Self Care | Payer: Medicare HMO | Source: Ambulatory Visit | Attending: Internal Medicine | Admitting: Internal Medicine

## 2023-02-26 DIAGNOSIS — R600 Localized edema: Secondary | ICD-10-CM

## 2023-02-26 DIAGNOSIS — E118 Type 2 diabetes mellitus with unspecified complications: Secondary | ICD-10-CM | POA: Diagnosis not present

## 2023-02-28 LAB — VAS US ABI WITH/WO TBI
Left ABI: 1.3
Right ABI: 1.29

## 2023-03-05 ENCOUNTER — Ambulatory Visit (INDEPENDENT_AMBULATORY_CARE_PROVIDER_SITE_OTHER): Payer: Medicare HMO

## 2023-03-05 VITALS — Ht 61.0 in | Wt 190.0 lb

## 2023-03-05 DIAGNOSIS — Z780 Asymptomatic menopausal state: Secondary | ICD-10-CM | POA: Diagnosis not present

## 2023-03-05 DIAGNOSIS — Z0000 Encounter for general adult medical examination without abnormal findings: Secondary | ICD-10-CM

## 2023-03-05 DIAGNOSIS — M8580 Other specified disorders of bone density and structure, unspecified site: Secondary | ICD-10-CM

## 2023-03-05 NOTE — Progress Notes (Signed)
 Subjective:   Toni Hicks is a 73 y.o. who presents for a Medicare Wellness preventive visit.  Visit Complete: Virtual I connected with  [name] on 03/05/23 by a video and audio enabled telemedicine application and verified that I am speaking with the correct person using two identifiers.  Patient Location: Home  Provider Location: Office/Clinic  I discussed the limitations of evaluation and management by telemedicine. The patient expressed understanding and agreed to proceed.  Vital Signs: Because this visit was a virtual/telehealth visit, some criteria may be missing or patient reported. Any vitals not documented were not able to be obtained and vitals that have been documented are patient reported.    AWV Questionnaire: No: Patient Medicare AWV questionnaire was not completed prior to this visit.  Cardiac Risk Factors include: advanced age (>68men, >48 women);diabetes mellitus;hypertension;obesity (BMI >30kg/m2)     Objective:    Today's Vitals   03/05/23 1057  Weight: 190 lb (86.2 kg)  Height: 5\' 1"  (1.549 m)   Body mass index is 35.9 kg/m.     03/05/2023   10:55 AM 01/18/2022   11:11 AM 07/29/2018   11:05 AM 07/25/2018    3:54 PM 03/05/2014    6:06 PM 03/05/2014   12:34 PM  Advanced Directives  Does Patient Have a Medical Advance Directive? No Yes Yes Yes No No  Type of Special educational needs teacher of [place];Living will Healthcare Power of [place];Living will Healthcare Power of [place];Living will    Does patient want to make changes to medical advance directive?   No - Patient declined No - Patient declined    Copy of Healthcare Power of Attorney in Chart?  No - copy requested No - copy requested No - copy requested    Would patient like information on creating a medical advance directive? Yes (MAU/Ambulatory/Procedural Areas - Information given)    Yes - Educational materials given     Current Medications (verified) Outpatient Encounter  Medications as of 03/05/2023  Medication Sig   albuterol (VENTOLIN HFA) 108 (90 Base) MCG/ACT inhaler INHALE 1-2 PUFFS BY MOUTH EVERY 6 HOURS AS NEEDED FOR WHEEZE OR SHORTNESS OF BREATH   amLODipine (NORVASC) 5 MG tablet TAKE 1 TABLET BY MOUTH EVERY DAY   b complex vitamins tablet Take 1 tablet by mouth 3 (three) times a week.   DULoxetine (CYMBALTA) 20 MG capsule TAKE 1 CAPSULE BY MOUTH EVERY DAY   Dupilumab (DUPIXENT) 300 MG/2ML SOPN Inject 300 mg into the skin every 14 (fourteen) days.   EPINEPHrine 0.3 mg/0.3 mL IJ SOAJ injection Inject 0.3 mg into the muscle as needed for anaphylaxis.   fluocinonide cream (LIDEX) 0.05 % Apply 1 application topically 2 (two) times daily as needed.   Fluticasone-Umeclidin-Vilant (TRELEGY ELLIPTA) 100-62.5-25 MCG/ACT AEPB Inhale 1 puff then rinse mouth, once daily   hydrocortisone 2.5 % ointment Apply topically 2 (two) times daily.   meloxicam (MOBIC) 7.5 MG tablet Take 7.5 mg by mouth daily.   montelukast (SINGULAIR) 10 MG tablet Take 1 tablet (10 mg total) by mouth at bedtime.   Sodium Sulfate-Mag Sulfate-KCl (SUTAB) [phone] MG TABS Take 12 tablets as directed the evening before your colonoscopy.  Take 12 tablets as directed the day of your colonoscopy..   traZODone (DESYREL) 50 MG tablet TAKE 0.5-1 TABLETS BY MOUTH AT BEDTIME AS NEEDED FOR SLEEP.   Vitamin D, Ergocalciferol, (DRISDOL) 1.25 MG (50000 UNIT) CAPS capsule TAKE 1 CAPSULE (50,000 UNITS TOTAL) BY MOUTH EVERY 7 (SEVEN) DAYS  No facility-administered encounter medications on file as of 03/05/2023.    Allergies (verified) Astelin [azelastine], Nasonex [mometasone], and Other   History: Past Medical History:  Diagnosis Date   ADD (attention deficit disorder)    Adult acne    Alcohol abuse    Allergic rhinitis    Anxiety and depression    Arthritis    Asthma    Congenital pneumonia    COPD (chronic obstructive pulmonary disease) (HCC)    Eczema    Hypertension    Hypothyroidism     Impaired fasting blood sugar    Mycoplasma pneumonia    Restless leg syndrome    Seasonal allergies    Sleep apnea    Stroke (HCC)    Urticaria    Past Surgical History:  Procedure Laterality Date   DILATION AND CURETTAGE OF UTERUS     @ 73 years old   TOTAL HIP ARTHROPLASTY Left 07/29/2018   Procedure: Left Anterior Hip Arthroplasty;  Surgeon: Marcene Corning, MD;  Location: [facility];  Service: Orthopedics;  Laterality: Left;   WISDOM TOOTH EXTRACTION     Family History  Problem Relation Age of Onset   Cancer Mother        breast   Hypertension Mother    Dementia Mother    Stroke Mother    Breast cancer Mother    COPD Father    Heart failure Father    Coronary artery disease Father    Colon cancer Neg Hx    Rectal cancer Neg Hx    Stomach cancer Neg Hx    Esophageal cancer Neg Hx    Social History   Socioeconomic History   Marital status: Widowed    Spouse name: has boyfriend Sam   Number of children: 2   Years of education: 17   Highest education level: Some college, no degree  Occupational History   Occupation: Product/process development scientist    Comment: Semi-retired  Tobacco Use   Smoking status: Former    Current packs/day: 0.00    Average packs/day: 2.0 packs/day for 15.0 years (30.0 ttl pk-yrs)    Types: Cigarettes    Start date: 7    Quit date: 1980    Years since quitting: 45.1    Passive exposure: Past   Smokeless tobacco: Never  Vaping Use   Vaping status: Former  Substance and Sexual Activity   Alcohol use: Not Currently    Alcohol/week: 1.0 - 2.0 standard drink of alcohol    Types: 1 - 2 Glasses of wine per week    Comment: Maybe 1-2 glass / day   Drug use: No   Sexual activity: Not on file  Other Topics Concern   Not on file  Social History Narrative   HS[employer], [employer] MA- photography   Married '73- 10 years divorced, married '86- 10 years divorced, Married '96- 10 years- widowed.   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   Social Drivers of Corporate investment banker Strain: Low Risk  (03/05/2023)   Overall Financial Resource Strain (CARDIA)    Difficulty of Paying Living Expenses: Not hard at all  Food Insecurity: No Food Insecurity (03/05/2023)   Hunger Vital Sign    Worried About Running Out of Food in the Last Year: Never true    Ran Out of Food in the Last Year: Never true  Transportation Needs: No Transportation Needs (03/05/2023)   PRAPARE -  Administrator, Civil Service (Medical): No    Lack of Transportation (Non-Medical): No  Physical Activity: Sufficiently Active (03/05/2023)   Exercise Vital Sign    Days of Exercise per Week: 7 days    Minutes of Exercise per Session: 50 min  Stress: No Stress Concern Present (03/05/2023)   [employer] of Occupational Health - Occupational Stress Questionnaire    Feeling of Stress : Not at all  Social Connections: Moderately Isolated (03/05/2023)   Social Connection and Isolation Panel [NHANES]    Frequency of Communication with Friends and Family: More than three times a week    Frequency of Social Gatherings with Friends and Family: More than three times a week    Attends Religious Services: Never    Database administrator or Organizations: No    Attends Engineer, structural: Never    Marital Status: Living with partner    Tobacco Counseling Counseling given: Not Answered    Clinical Intake:  Pre-visit preparation completed: Yes  Pain : No/denies pain     BMI - recorded: 35.9 Nutritional Status: BMI > 30  Obese Nutritional Risks: None Diabetes: Yes CBG done?: No Did pt. bring in CBG monitor from home?: No  How often do you need to have someone help you when you read instructions, pamphlets, or other written materials from your doctor or pharmacy?: 1 - Never  Interpreter Needed?: No  Information entered by :: Hassell Halim, CMA   Activities of Daily Living  Completed 03/05/2023    03/05/2023   11:03 AM  In your present state of health, do you have any difficulty performing the following activities:  Hearing? 0  Vision? 0  Difficulty concentrating or making decisions? 0  Walking or climbing stairs? 0  Dressing or bathing? 0  Doing errands, shopping? 0  Preparing Food and eating ? N  Using the Toilet? N  In the past six months, have you accidently leaked urine? N  Do you have problems with loss of bowel control? N  Managing your Medications? N  Managing your Finances? N  Housekeeping or managing your Housekeeping? N    Patient Care Team: Myrlene Broker, MD as PCP - General (Internal Medicine) Rollene Rotunda, MD as PCP - Cardiology (Cardiology)  Indicate any recent Medical Services you may have received from other than [facility] providers in the past year (date may be approximate).     Assessment:   This is a routine wellness examination for Bearden.  Hearing/Vision screen Hearing Screening - Comments:: Denies hearing difficulties   Vision Screening - Comments:: Wears rx glasses - up to date with routine eye exams with [facility]    Goals Addressed               This Visit's Progress     Patient Stated (pt-stated)        Patient stated she'd like to drop weight (30-50lbs) and get more sleep.       Depression Screen Completed 03/05/2023    03/05/2023   11:13 AM 03/05/2023   11:12 AM 08/03/2022    8:27 AM 01/18/2022   11:17 AM 12/04/2021   11:02 AM 02/24/2020    8:55 AM 02/11/2018    1:27 PM  PHQ 2/9 Scores  PHQ - 2 Score 0 0 1 0 0 0 1  PHQ- 9 Score   9 7 7       Fall Risk Completed 03/05/2023    03/05/2023   11:07 AM  01/21/2023   10:09 AM 08/03/2022    8:27 AM 05/28/2022   10:52 AM 12/04/2021   11:02 AM  Fall Risk   Falls in the past year? 0 0 1 0 1  Number falls in past yr: 0 0 1 0 1  Injury with Fall? 0 0 0  1  Risk for fall due to : No Fall Risks  History of fall(s)  History of fall(s)  Follow up  Falls evaluation completed;Falls prevention discussed Falls evaluation completed Falls evaluation completed  Falls evaluation completed    MEDICARE RISK AT HOME: Completed 03/05/2023 Medicare Risk at Home Any stairs in or around the home?: Yes (in basement and outside) If so, are there any without handrails?: Yes (no handrails) Home free of loose throw rugs in walkways, pet beds, electrical cords, etc?: Yes Adequate lighting in your home to reduce risk of falls?: Yes Life alert?: No Use of a cane, walker or w/c?: Yes (cane/walker) Grab bars in the bathroom?: Yes Shower chair or bench in shower?: Yes Elevated toilet seat or a handicapped toilet?: No  TIMED UP AND GO:  Was the test performed?  No  Cognitive Function: 6CIT completed    05/14/2014    8:37 AM  MMSE - Mini Mental State Exam  Orientation to time 5  Orientation to Place 5  Registration 3  Attention/ Calculation 5  Recall 3  Language- name 2 objects 2  Language- repeat 1  Language- follow 3 step command 3  Language- read & follow direction 1  Write a sentence 1  Copy design 1  Total score 30        03/05/2023   11:09 AM 01/18/2022   11:16 AM  6CIT Screen  What Year? 0 points 0 points  What month? 0 points 0 points  What time? 0 points 0 points  Count back from 20 0 points 0 points  Months in reverse 0 points 0 points  Repeat phrase 0 points 0 points  Total Score 0 points 0 points    Immunizations Immunization History  Administered Date(s) Administered   Moderna Covid-19 Fall Seasonal Vaccine 52yrs & older 09/14/2022   Moderna Sars-Covid-2 Vaccination 04/25/2020   PFIZER(Purple Top)SARS-COV-2 Vaccination 02/22/2019, 03/17/2019, 11/05/2019   Pfizer Covid-19 Vaccine Bivalent Booster 6yrs & up 10/25/2020   Tdap 12/15/2015   Zoster, Live 12/15/2015    Screening Tests Health Maintenance  Topic Date Due   Pneumonia Vaccine 35+ Years old (1 of 2 - PCV) Never done   Hepatitis C Screening  Never done    Zoster Vaccines- Shingrix (1 of 2) 04/05/1970   OPHTHALMOLOGY EXAM  02/09/2015   DEXA SCAN  Never done   COVID-19 Vaccine (7 - 2024-25 season) 11/09/2022   HEMOGLOBIN A1C  11/28/2022   INFLUENZA VACCINE  04/08/2023 (Originally 08/09/2022)   Diabetic kidney evaluation - eGFR measurement  05/28/2023   Diabetic kidney evaluation - Urine ACR  05/28/2023   FOOT EXAM  05/28/2023   MAMMOGRAM  02/16/2024   Medicare Annual Wellness (AWV)  03/04/2024   DTaP/Tdap/Td (2 - Td or Tdap) 12/14/2025   Colonoscopy  02/05/2026   HPV VACCINES  Aged Out   Fecal DNA (Cologuard)  Discontinued    Health Maintenance  Health Maintenance Due  Topic Date Due   Pneumonia Vaccine 72+ Years old (1 of 2 - PCV) Never done   Hepatitis C Screening  Never done   Zoster Vaccines- Shingrix (1 of 2) 04/05/1970   OPHTHALMOLOGY EXAM  02/09/2015  DEXA SCAN  Never done   COVID-19 Vaccine (7 - 2024-25 season) 11/09/2022   HEMOGLOBIN A1C  11/28/2022   Health Maintenance Items Addressed: 03/05/2023   Pt declines the Pneumonia and COVID vaccines.  Pt is considering the Hepatitis C and Shingles vaccines and educated - plans to get at local pharmacy.    Foot Exam status: Completed 05/28/22 w/PCP and due in 06/2023 w/PCP.   DEXA ordered on 03/05/2023.  Additional Screening:  Vision Screening: Recommended annual ophthalmology exams for early detection of glaucoma and other disorders of the eye. Seen at [facility] in 2024 for routine eye exam.    Dental Screening: Recommended annual dental exams for proper oral hygiene  Community Resource Referral / Chronic Care Management: CRR required this visit?  No   CCM required this visit?  No     Plan:     I have personally reviewed and noted the following in the patient's chart:   Medical and social history Use of alcohol, tobacco or illicit drugs  Current medications and supplements including opioid prescriptions. Patient is not currently taking opioid  prescriptions. Functional ability and status Nutritional status Physical activity Advanced directives List of other physicians Hospitalizations, surgeries, and ER visits in previous 12 months Vitals Screenings to include cognitive, depression, and falls Referrals and appointments  In addition, I have reviewed and discussed with patient certain preventive protocols, quality metrics, and best practice recommendations. A written personalized care plan for preventive services as well as general preventive health recommendations were provided to patient.     Darreld Mclean, CMA   03/05/2023   After Visit Summary: (MyChart) Due to this being a telephonic visit, the after visit summary with patients personalized plan was offered to patient via MyChart   Notes:  Ordered DEXA scan.

## 2023-03-05 NOTE — Addendum Note (Signed)
 Addended by: Darreld Mclean on: 03/05/2023 12:22 PM   Modules accepted: Orders

## 2023-03-05 NOTE — Patient Instructions (Addendum)
 Toni Hicks , Thank you for taking time to come for your Medicare Wellness Visit. I appreciate your ongoing commitment to your health goals. Please review the following plan we discussed and let me know if I can assist you in the future.   Referrals/Orders/Follow-Ups/Clinician Recommendations: Aim for 30 minutes of exercise or brisk walking, 6-8 glasses of water, and 5 servings of fruits and vegetables each day. Educated and advised to get the Pneumonia, Hepatitis C, Shingles, and COVID vaccines.  Ordered a DEXA scan.    This is a list of the screening recommended for you and due dates:  Health Maintenance  Topic Date Due   Pneumonia Vaccine (1 of 2 - PCV) Never done   Hepatitis C Screening  Never done   Zoster (Shingles) Vaccine (1 of 2) 04/05/1970   Eye exam for diabetics  02/09/2015   DEXA scan (bone density measurement)  Never done   COVID-19 Vaccine (7 - 2024-25 season) 11/09/2022   Hemoglobin A1C  11/28/2022   Flu Shot  04/08/2023*   Yearly kidney function blood test for diabetes  05/28/2023   Yearly kidney health urinalysis for diabetes  05/28/2023   Complete foot exam   05/28/2023   Mammogram  02/16/2024   Medicare Annual Wellness Visit  03/04/2024   DTaP/Tdap/Td vaccine (2 - Td or Tdap) 12/14/2025   Colon Cancer Screening  02/05/2026   HPV Vaccine  Aged Out   Cologuard (Stool DNA test)  Discontinued  *Topic was postponed. The date shown is not the original due date.    Advanced directives: (Provided) Advance directive discussed with you today. I have provided a copy for you to complete at home and have notarized. Once this is complete, please bring a copy in to our office so we can scan it into your chart.   Next Medicare Annual Wellness Visit scheduled for next year: Yes - 06/2024

## 2023-03-06 ENCOUNTER — Telehealth: Payer: Self-pay | Admitting: Internal Medicine

## 2023-03-06 NOTE — Telephone Encounter (Signed)
 PT calling because her last Trelegy refill, she says was for three units. She states she only got one unit because the pharm was out of stock and they said they'd call her when the others came in. When she told the pharm this they told her they had proof on video she was given three units. She still feels it was one unit and now they will not refill her for a few months. Please call PT to advise. Her suggestions is samples to hold her over. Her # is [phone]

## 2023-03-07 MED ORDER — TRELEGY ELLIPTA 100-62.5-25 MCG/ACT IN AEPB: INHALATION_SPRAY | RESPIRATORY_TRACT | 0 refills | Status: DC

## 2023-03-07 NOTE — Telephone Encounter (Signed)
 Trelegy has been sent to preferred pharmacy.  Pt is aware and voiced her understanding.  Nothing further needed.

## 2023-03-07 NOTE — Telephone Encounter (Signed)
 Patient has been out of Trelegy for a week and needs refills or samples. The pharmacy states that she was given all three when they were actual short and only gave her one.   [facility] [address]

## 2023-03-10 ENCOUNTER — Other Ambulatory Visit: Payer: Self-pay | Admitting: Pulmonary Disease

## 2023-03-10 DIAGNOSIS — J4540 Moderate persistent asthma, uncomplicated: Secondary | ICD-10-CM

## 2023-03-13 ENCOUNTER — Ambulatory Visit: Payer: Self-pay | Admitting: Internal Medicine

## 2023-03-13 NOTE — Telephone Encounter (Signed)
  Chief Complaint: L ear fullness Symptoms: decreased hearing, stuffy Frequency: began this morning Pertinent Negatives: Patient denies pain, fever Disposition: [] ED /[] Urgent Care (no appt availability in office) / [] Appointment(In office/virtual)/ []  [facility]/ [x] Home Care/ [] Refused Recommended Disposition /[] [facility]/ []  Follow-up with PCP Additional Notes: Patient calls reporting ear stuffiness that began this morning. Patient states she has had to have an ear lavage in the past for wax build up and this feels similar. Patient reports she has been flushing her ear with peroxide and has had some improvement of symptoms. Patient states she is going out of town tomorrow and may go to [facility] for eval if necessary. Per protocol, home care appropriate. Care advice reviewed, patient verbalized understanding and denies further questions at this time. Advised to monitor for worsening signs and symptoms and call back with any changes. Alerting PCP for review.    Reason for Disposition  Ear congestion  Answer Assessment - Initial Assessment Questions 1. LOCATION: "Which ear is involved?"       L ear 2. SENSATION: "Describe how the ear feels." (e.g. stuffy, full, plugged)."      Decreased hearing, feels plugged, states she used hydrogen peroxide in her ear and it has improved. 3. ONSET:  "When did the ear symptoms start?"       This morning 4. PAIN: "Do you also have an earache?" If Yes, ask: "How bad is it?" (Scale 1-10; or mild, moderate, severe)     Denies 5. CAUSE: "What do you think is causing the ear congestion?"     Ear wax build up, sinus 6. URI: "Do you have a runny nose or cough?"      Allergies have been acting up. 7. NASAL ALLERGIES: "Are there symptoms of hay fever, such as sneezing or a clear nasal discharge?"     Sneezing, runny nose.  Protocols used: Ear - Congestion-A-AH

## 2023-03-25 ENCOUNTER — Telehealth: Payer: Self-pay | Admitting: Internal Medicine

## 2023-03-25 DIAGNOSIS — J4540 Moderate persistent asthma, uncomplicated: Secondary | ICD-10-CM

## 2023-03-25 NOTE — Telephone Encounter (Signed)
 montelukast (SINGULAIR) 10 MG tablet [[id number]]   She needs [facility] day supply of [facility]. [facility] [address]  She last got only 10 pills. Please send correction. TY.

## 2023-03-26 MED ORDER — MONTELUKAST SODIUM 10 MG PO TABS: 10.0000 mg | ORAL_TABLET | Freq: Every day | ORAL | 0 refills | Status: DC

## 2023-03-26 NOTE — Telephone Encounter (Signed)
 OK to send in Singulair.  Per Dr. Sinclair Ship office note from 11/20/2022:  Singulair is listed:  -Ventolin hfa, Singulair, Trelegy 100, Neb albuterol, Dupixent, LOV 9/23-Dr Mannam- COPD exacerbation> Zpak, prednisone  And return: Return in about 6 months (around 05/20/2023).   Will call in refill of 90 days supply with no additional refills.  Patient will need return office visit in May 2025  Sent in rx.

## 2023-04-04 ENCOUNTER — Telehealth: Payer: Self-pay | Admitting: Internal Medicine

## 2023-04-04 DIAGNOSIS — J4540 Moderate persistent asthma, uncomplicated: Secondary | ICD-10-CM

## 2023-04-04 NOTE — Telephone Encounter (Signed)
 Natalie from [facility] called and states patient needs a new script for Dupixent- she has been out of it for 1 month. Requesting a script for 1 month (1 box with 2 pens) so that she can reach her out of pocket max so insurance will start covering it. Dorene Grebe is resending authorization to [place] at fax number [phone]. Please send prescription to [facility] [address] phone number: [phone].   Natalies call back is [phone]

## 2023-04-10 ENCOUNTER — Telehealth: Payer: Self-pay

## 2023-04-10 NOTE — Telephone Encounter (Signed)
 Please direct her question about Dupixent status to our pharmacy team.  Meanwhile, if not otherwise specified from last visit, she should be seen here once a year.

## 2023-04-10 NOTE — Telephone Encounter (Signed)
 Copied from CRM [id number]. Topic: Clinical - Prescription Issue >> Apr 09, 2023  3:11 PM Isabell A wrote: Reason for CRM: Patient is calling to check the status of her refills for Dupixent - states there was an issue with the cost & Dr.Young had to sign the paperwork & send a new prescription for one month at a time.  Per telephone encounter on 04-04-23, the RX team stated,   " Pt's last visit was 11/20/22, she currently does not have a f/u scheduled. Please advise whether or not if appt is needed prior to refill "  Please advise.

## 2023-04-11 NOTE — Telephone Encounter (Addendum)
 We received a fax from [facility] stating Dupixent has been denied. I am not sure who submitted this prior authorization  Submitted an URGENT appeal to [facility] for DUPIXENT.  Phone: [phone]  Fax: [phone]   Patient to stop by Monday afternoon for a Dupixent sample  She states she no longer qualifies for patient assistance through [employer] and will pay towards her $2000 OOP max  Chesley Mires, PharmD, MPH, BCPS, CPP Clinical Pharmacist (Rheumatology and Pulmonology)

## 2023-04-12 ENCOUNTER — Ambulatory Visit (INDEPENDENT_AMBULATORY_CARE_PROVIDER_SITE_OTHER)
Admission: RE | Admit: 2023-04-12 | Discharge: 2023-04-12 | Disposition: A | Discharge status: Home / Self Care | Source: Ambulatory Visit | Attending: Internal Medicine | Admitting: Internal Medicine

## 2023-04-12 DIAGNOSIS — Z780 Asymptomatic menopausal state: Secondary | ICD-10-CM | POA: Diagnosis not present

## 2023-04-15 ENCOUNTER — Encounter: Payer: Self-pay | Admitting: Internal Medicine

## 2023-04-16 ENCOUNTER — Telehealth: Payer: Self-pay

## 2023-04-16 NOTE — Telephone Encounter (Signed)
 Copied from CRM [id number]. Topic: Clinical - Medication Question >> Apr 16, 2023  1:37 PM Chantha C wrote: Reason for CRM: Patient wants to speak with the pharmacist regarding Dupilumab (DUPIXENT) 300 MG/2ML SOPN, patient is no longer covering patient and wants to figure out what can be done. Patient states she's going to the gym in hour and be available after 4:30 pm today. Please advise and call back [phone].

## 2023-04-17 ENCOUNTER — Other Ambulatory Visit (HOSPITAL_COMMUNITY): Payer: Self-pay

## 2023-04-17 NOTE — Telephone Encounter (Signed)
 Returned call to patient. She states she received a letter from [facility] dated 04/04/23 with denial for Dupixent. I advised that we are aware and have already submitted urgent appeal for her.  Advised that we will reach out once we receive determination  Chesley Mires, PharmD, MPH, BCPS, CPP Clinical Pharmacist (Rheumatology and Pulmonology)

## 2023-04-18 ENCOUNTER — Other Ambulatory Visit: Payer: Self-pay | Admitting: Internal Medicine

## 2023-04-19 NOTE — Telephone Encounter (Signed)
 Copied from CRM [id number]. Topic: Clinical - Medication Question >> Apr 18, 2023  4:51 PM Renie Ora wrote: Reason for CRM: Patient called and inquired about medication. Patient stated Emi Belfast has a sample doses she informed she could pick up and patient would like to know if she can come into the office on tomorrow and pickup the medication. Patient stated she has been without the medication for 30 days. Contacted [facility] and spoke with Channing. Channing contacted [facility] regarding this, no response at this time. Patient stated she would like a callback as soon as possible regarding this.   Patient calling again. Routing to [facility], please advise

## 2023-04-19 NOTE — Telephone Encounter (Signed)
 We do not have samples of Dupixent at this time. ATC patient but phone went to VM. Advised patient to stop by clinic on  Wednesday for sample. She had previously been advised to stop by Monday of this week for samples and looks like didn't do so  Chesley Mires, PharmD, MPH, BCPS, CPP Clinical Pharmacist (Rheumatology and Pulmonology)

## 2023-04-22 ENCOUNTER — Other Ambulatory Visit (HOSPITAL_COMMUNITY): Payer: Self-pay

## 2023-04-23 ENCOUNTER — Other Ambulatory Visit (HOSPITAL_COMMUNITY): Payer: Self-pay

## 2023-04-23 NOTE — Telephone Encounter (Signed)
 Copied from CRM [id number]. Topic: Clinical - Prescription Issue >> Apr 23, 2023 10:14 AM Toni Hicks wrote: Reason for CRM: Patient states she finally got approved for Dupixent, would like to speak with pharmacist.  Callback number: [phone]  Routing to RX team

## 2023-04-24 ENCOUNTER — Other Ambulatory Visit: Payer: Self-pay | Admitting: Pharmacist

## 2023-04-24 ENCOUNTER — Other Ambulatory Visit (HOSPITAL_COMMUNITY): Payer: Self-pay

## 2023-04-24 MED ORDER — DUPIXENT 300 MG/2ML ~~LOC~~ SOAJ
300.0000 mg | SUBCUTANEOUS | 1 refills | Status: DC
  Filled 2023-04-29: qty 4, 28d supply, fill #0
  Filled 2023-05-22: qty 4, 28d supply, fill #1
  Filled 2023-06-28: qty 4, 28d supply, fill #2
  Filled 2023-07-24: qty 4, 28d supply, fill #3
  Filled 2023-08-16 – 2023-08-19 (×2): qty 4, 28d supply, fill #4
  Filled 2023-09-13 – 2023-10-02 (×3): qty 4, 28d supply, fill #5

## 2023-04-24 NOTE — Progress Notes (Signed)
 Patient no longer eligible for DMW PAP. Will pay towards $2000 OOP max. Continue Dupixent 300mg  subcut every 14 days for asthma-COPD overlap, atopic dermatitis, and nasal polyposis  Has had interruption in treatment and has noticed her atopic dermatitis specifically worsening on palm and legs  Geraldene Kleine, PharmD, MPH, BCPS, CPP Clinical Pharmacist (Rheumatology and Pulmonology)

## 2023-04-24 NOTE — Telephone Encounter (Signed)
 Patient stopped by for Dupixent dose today. We did not have pens in stock so I offered to administer PFS for patient since her eczema is worsening.  She states she received a letter from [facility] stating that redetermination led to approval of Dupixent but I advised patient that we have not yet received approval. Test claim shows $626 for 28 day supply  Rx sent to [facility] and patient advised that pharmacy/[facility] will call to set up first shipment to home.  Geraldene Kleine, PharmD, MPH, BCPS, CPP Clinical Pharmacist (Rheumatology and Pulmonology)

## 2023-04-29 ENCOUNTER — Other Ambulatory Visit: Payer: Self-pay

## 2023-04-29 ENCOUNTER — Other Ambulatory Visit (HOSPITAL_COMMUNITY): Payer: Self-pay

## 2023-04-29 NOTE — Progress Notes (Signed)
 Specialty Pharmacy Initial Fill Coordination Note  Toni Hicks is a 73 y.o. female contacted today regarding initial fill of specialty medication(s) Dupilumab  (Dupixent )   Patient requested Delivery   Delivery date: 05/01/23   Verified address: [address]  [place] [address]   Medication will be filled on 04/30/2023.   Patient is aware of $672.62 copayment. Payment information collected and forwarded to [place] at [facility].

## 2023-04-30 ENCOUNTER — Other Ambulatory Visit: Payer: Self-pay

## 2023-05-15 ENCOUNTER — Other Ambulatory Visit (HOSPITAL_COMMUNITY): Payer: Self-pay

## 2023-05-16 ENCOUNTER — Telehealth: Payer: Self-pay

## 2023-05-16 NOTE — Telephone Encounter (Signed)
 Copied from CRM [id number]. Topic: Clinical - Medication Question >> May 16, 2023  4:04 PM Toni Hicks wrote: Reason for CRM: Patient would like to confirm the last date she had her Dupixent  shot, she is not sure if she should've taken her shot yesterday or next week.   OK to leave voicemail stating date.  I tried calling pt. It went to vm. I left her Hicks vm informing her that we last filled this on 04-24-23 and her next refill date is due 05-28-23. NFN

## 2023-05-16 NOTE — Telephone Encounter (Unsigned)
 Copied from CRM [id number]. Topic: Clinical - Medication Question >> May 16, 2023  4:04 PM Isabell A wrote: Reason for CRM: Patient would like to confirm the last date she had her Dupixent  shot, she is not sure if she should've taken her shot yesterday or next week.   OK to leave voicemail stating date.

## 2023-05-22 ENCOUNTER — Other Ambulatory Visit: Payer: Self-pay

## 2023-05-22 NOTE — Progress Notes (Signed)
 Specialty Pharmacy Refill Coordination Note  Toni Hicks is a 73 y.o. female contacted today regarding refills of specialty medication(s) Dupilumab  (Dupixent )   Patient requested Delivery   Delivery date: 06/05/23   Verified address: [address]  [place] [address]   Medication will be filled on 05.27.25.

## 2023-06-08 ENCOUNTER — Other Ambulatory Visit: Payer: Self-pay | Admitting: Family Medicine

## 2023-06-14 ENCOUNTER — Telehealth: Payer: Self-pay | Admitting: Internal Medicine

## 2023-06-14 ENCOUNTER — Encounter: Payer: Medicare HMO | Admitting: Internal Medicine

## 2023-06-14 NOTE — Telephone Encounter (Signed)
 Copied from CRM [id number]. Topic: Appointments - Appointment Cancel/Reschedule >> Jun 14, 2023 11:19 AM Allyne Areola wrote: Patient/patient representative is calling to cancel or reschedule an appointment. Refer to attachments for appointment information. Patient is calling because she is scheduled for a physical but was not able to make it, she is sick, throwing up and just doesn't feel well. Offered to transfer call to triage but she declined.

## 2023-06-26 ENCOUNTER — Other Ambulatory Visit: Payer: Self-pay

## 2023-06-28 ENCOUNTER — Other Ambulatory Visit: Payer: Self-pay

## 2023-06-28 NOTE — Progress Notes (Signed)
 Specialty Pharmacy Refill Coordination Note  Toni Hicks is a 73 y.o. female contacted today regarding refills of specialty medication(s) Dupilumab  (Dupixent )   Patient requested Delivery   Delivery date: 07/02/23   Verified address: [address]  [place] [address]   Medication will be filled on 07/01/23.

## 2023-07-06 ENCOUNTER — Other Ambulatory Visit: Payer: Self-pay | Admitting: Internal Medicine

## 2023-07-06 DIAGNOSIS — J4540 Moderate persistent asthma, uncomplicated: Secondary | ICD-10-CM

## 2023-07-10 ENCOUNTER — Telehealth: Payer: Self-pay | Admitting: Family Medicine

## 2023-07-10 ENCOUNTER — Other Ambulatory Visit: Payer: Self-pay

## 2023-07-10 MED ORDER — MELOXICAM 7.5 MG PO TABS: 7.5000 mg | ORAL_TABLET | Freq: Every day | ORAL | 0 refills | Status: DC

## 2023-07-10 NOTE — Telephone Encounter (Signed)
 Pt calling for refill of Meloxicam  [facility] [address]. Last OV 11/2022, pt was to recheck in 3 months but seems unaware. Scheduled for recheck August 27 based on provider schedule.

## 2023-07-22 ENCOUNTER — Other Ambulatory Visit (HOSPITAL_COMMUNITY): Payer: Self-pay

## 2023-07-24 ENCOUNTER — Other Ambulatory Visit: Payer: Self-pay | Admitting: Pharmacy Technician

## 2023-07-24 ENCOUNTER — Other Ambulatory Visit: Payer: Self-pay

## 2023-07-24 NOTE — Progress Notes (Signed)
 Specialty Pharmacy Refill Coordination Note  Toni Hicks is a 73 y.o. female contacted today regarding refills of specialty medication(s) Dupilumab  (Dupixent )   Patient requested Delivery   Delivery date: 07/25/23   Verified address: [address]  [name]   Medication will be filled on 07/24/23.

## 2023-08-11 ENCOUNTER — Other Ambulatory Visit: Payer: Self-pay | Admitting: Family Medicine

## 2023-08-13 ENCOUNTER — Other Ambulatory Visit: Payer: Self-pay | Admitting: Internal Medicine

## 2023-08-16 ENCOUNTER — Other Ambulatory Visit: Payer: Self-pay

## 2023-08-19 ENCOUNTER — Encounter: Payer: Self-pay | Admitting: Internal Medicine

## 2023-08-19 ENCOUNTER — Other Ambulatory Visit: Payer: Self-pay

## 2023-08-19 ENCOUNTER — Ambulatory Visit (INDEPENDENT_AMBULATORY_CARE_PROVIDER_SITE_OTHER): Admitting: Internal Medicine

## 2023-08-19 ENCOUNTER — Other Ambulatory Visit: Payer: Self-pay | Admitting: Internal Medicine

## 2023-08-19 VITALS — BP 120/70 | HR 90 | Temp 98.5°F | Ht 61.0 in | Wt 201.0 lb

## 2023-08-19 DIAGNOSIS — E118 Type 2 diabetes mellitus with unspecified complications: Secondary | ICD-10-CM | POA: Diagnosis not present

## 2023-08-19 DIAGNOSIS — Z8673 Personal history of transient ischemic attack (TIA), and cerebral infarction without residual deficits: Secondary | ICD-10-CM | POA: Diagnosis not present

## 2023-08-19 DIAGNOSIS — F1021 Alcohol dependence, in remission: Secondary | ICD-10-CM

## 2023-08-19 DIAGNOSIS — Z0001 Encounter for general adult medical examination with abnormal findings: Secondary | ICD-10-CM

## 2023-08-19 DIAGNOSIS — G4733 Obstructive sleep apnea (adult) (pediatric): Secondary | ICD-10-CM

## 2023-08-19 DIAGNOSIS — R413 Other amnesia: Secondary | ICD-10-CM

## 2023-08-19 DIAGNOSIS — Z1159 Encounter for screening for other viral diseases: Secondary | ICD-10-CM

## 2023-08-19 DIAGNOSIS — E559 Vitamin D deficiency, unspecified: Secondary | ICD-10-CM

## 2023-08-19 DIAGNOSIS — J449 Chronic obstructive pulmonary disease, unspecified: Secondary | ICD-10-CM | POA: Diagnosis not present

## 2023-08-19 LAB — COMPREHENSIVE METABOLIC PANEL WITH GFR
ALT: 35 U/L (ref 0–35)
AST: 38 U/L — ABNORMAL HIGH (ref 0–37)
Albumin: 4.6 g/dL (ref 3.5–5.2)
Alkaline Phosphatase: 84 U/L (ref 39–117)
BUN: 10 mg/dL (ref 6–23)
CO2: 29 meq/L (ref 19–32)
Calcium: 9.2 mg/dL (ref 8.4–10.5)
Chloride: 101 meq/L (ref 96–112)
Creatinine, Ser: 0.57 mg/dL (ref 0.40–1.20)
GFR: 90.82 mL/min (ref >60.00)
Glucose, Bld: 110 mg/dL — ABNORMAL HIGH (ref 70–99)
Potassium: 4 meq/L (ref 3.5–5.1)
Sodium: 138 meq/L (ref 135–145)
Total Bilirubin: 0.4 mg/dL (ref 0.2–1.2)
Total Protein: 7.6 g/dL (ref 6.0–8.3)

## 2023-08-19 LAB — HEMOGLOBIN A1C: Hgb A1c MFr Bld: 6.7 % — ABNORMAL HIGH (ref 4.6–6.5)

## 2023-08-19 LAB — LIPID PANEL
Cholesterol: 146 mg/dL (ref 0–200)
HDL: 52.9 mg/dL (ref >39.00)
LDL Cholesterol: 76 mg/dL (ref 0–99)
NonHDL: 93.4
Total CHOL/HDL Ratio: 3
Triglycerides: 89 mg/dL (ref 0.0–149.0)
VLDL: 17.8 mg/dL (ref 0.0–40.0)

## 2023-08-19 LAB — MICROALBUMIN / CREATININE URINE RATIO
Creatinine,U: 39 mg/dL
Microalb Creat Ratio: UNDETERMINED mg/g (ref 0.0–30.0)
Microalb, Ur: <0.7 mg/dL

## 2023-08-19 LAB — VITAMIN D 25 HYDROXY (VIT D DEFICIENCY, FRACTURES): VITD: 25.54 ng/mL — ABNORMAL LOW (ref 30.00–100.00)

## 2023-08-19 LAB — CBC
HCT: 42.6 % (ref 36.0–46.0)
Hemoglobin: 14.1 g/dL (ref 12.0–15.0)
MCHC: 33.1 g/dL (ref 30.0–36.0)
MCV: 86.3 fl (ref 78.0–100.0)
Platelets: 271 K/uL (ref 150.0–400.0)
RBC: 4.93 Mil/uL (ref 3.87–5.11)
RDW: 14.2 % (ref 11.5–15.5)
WBC: 6.3 K/uL (ref 4.0–10.5)

## 2023-08-19 MED ORDER — ZEPBOUND 5 MG/0.5ML ~~LOC~~ SOAJ: 5.0000 mg | SUBCUTANEOUS | 0 refills | Status: DC

## 2023-08-19 MED ORDER — ZEPBOUND 2.5 MG/0.5ML ~~LOC~~ SOAJ: 2.5000 mg | SUBCUTANEOUS | 0 refills | Status: DC

## 2023-08-19 MED ORDER — ZEPBOUND 7.5 MG/0.5ML ~~LOC~~ SOAJ
7.5000 mg | SUBCUTANEOUS | 0 refills | Status: DC
Start: 2023-08-19 — End: 2023-09-16

## 2023-08-19 NOTE — Progress Notes (Signed)
 Specialty Pharmacy Refill Coordination Note  Toni Hicks is a 73 y.o. female contacted today regarding refills of specialty medication(s) Dupilumab  (Dupixent )   Patient requested Delivery   Delivery date: 08/21/23   Verified address: [address]  [place]   Medication will be filled on 08/20/23.

## 2023-08-19 NOTE — Telephone Encounter (Signed)
 Dx code OSA and morbid obesity indication was checked on rx

## 2023-08-19 NOTE — Progress Notes (Signed)
 Subjective:   Patient ID: Toni Hicks, female    DOB: [birth year], 73 y.o.   MRN: [id number]  The patient is here for physical. Pertinent topics discussed: Discussed the use of AI scribe software for clinical note transcription with the patient, who gave verbal consent to proceed.  History of Present Illness Toni Hicks is a 73 year old female who presents with sleep disturbances and memory issues.  She experiences significant sleep disturbances, averaging about four hours of sleep per night. She wakes up four to five times nightly, often due to nocturia or disturbances from her husband. She has tried various sleep aids in the past, including trazodone , hyperzine, and Benadryl , but discontinued them due to concerns about cognitive effects and dry mouth. Currently, she uses magnesium and tryptophan supplements, which help her fall back asleep more easily, though she still experiences frequent awakenings. She has a history of obstructive sleep apnea and uses a CPAP machine, with an average usage of about 4 hours per night.  She is concerned about cognitive decline, noting difficulties with word retrieval and memory lapses. She recalls a previous stroke in 2016 and is worried about potential mini-strokes since then. There is a family history of cognitive decline, as her mother experienced similar issues.  She experiences intermittent headaches, described as pressure and pounding, located at the top of her head, which intensify with head movement. She attributes some improvement to changing her washing sheet but still experiences occasional episodes.  She has a history of gastrointestinal issues, including constipation and the need for enemas about five times a year. She has been using probiotics, specifically Bioma, which have improved her gastrointestinal symptoms.  Her social history includes a reduction in alcohol consumption, now drinking most days 1-2 drinks but not daily, and she has quit  smoking cigarettes. She remains physically active, though recent weather conditions have limited her ability to swim and walk regularly. The patient denies new chest pains, breathing troubles, stomach troubles, diarrhea, constipation, or heartburn.  PMH, [facility], social history reviewed and updated  Review of Systems  Constitutional: Negative.   HENT: Negative.    Eyes: Negative.   Respiratory:  Negative for cough, chest tightness and shortness of breath.   Cardiovascular:  Negative for chest pain, palpitations and leg swelling.  Gastrointestinal:  Negative for abdominal distention, abdominal pain, constipation, diarrhea, nausea and vomiting.  Musculoskeletal: Negative.   Skin: Negative.   Neurological: Negative.        Cognitive changes  Psychiatric/Behavioral: Negative.      Objective:  Physical Exam Constitutional:      Appearance: She is well-developed.  HENT:     Head: Normocephalic and atraumatic.  Cardiovascular:     Rate and Rhythm: Normal rate and regular rhythm.  Pulmonary:     Effort: Pulmonary effort is normal. No respiratory distress.     Breath sounds: Normal breath sounds. No wheezing or rales.  Abdominal:     General: Bowel sounds are normal. There is no distension.     Palpations: Abdomen is soft.     Tenderness: There is no abdominal tenderness. There is no rebound.  Musculoskeletal:     Cervical back: Normal range of motion.  Skin:    General: Skin is warm and dry.  Neurological:     Mental Status: She is alert and oriented to person, place, and time.     Coordination: Coordination normal.     Vitals:   08/19/23 1050  BP: 120/70  Pulse: 90  Temp: 98.5 F (36.9 C)  TempSrc: Oral  SpO2: 92%  Weight: 201 lb (91.2 kg)  Height: 5' 1 (1.549 m)    Assessment & Plan:

## 2023-08-19 NOTE — Patient Instructions (Signed)
We will check the labs and the MRI of the brain.   

## 2023-08-20 ENCOUNTER — Ambulatory Visit: Payer: Self-pay | Admitting: Internal Medicine

## 2023-08-20 LAB — HEPATITIS C ANTIBODY: Hepatitis C Ab: NONREACTIVE

## 2023-08-22 NOTE — Assessment & Plan Note (Signed)
 Flu shot yearly. Pneumonia declines. Shingrix declines. Tetanus up to date. Colonoscopy up to date. Mammogram up to date, pap smear aged out and dexa complete. Counseled about sun safety and mole surveillance. Counseled about the dangers of distracted driving. Given 10 year screening recommendations.

## 2023-08-22 NOTE — Assessment & Plan Note (Signed)
 Checking HgA1c, lipid panel and CMP. BP at goal. Adjust as needed

## 2023-08-22 NOTE — Assessment & Plan Note (Signed)
 She is still drinking same amounts as prior 1-2 drinks per day about 5-6 days per week. We discussed that this can still contribute to cognitive change over time and that no alcohol is best. She is still significantly less than prior consumption.

## 2023-08-22 NOTE — Assessment & Plan Note (Signed)
 Checking HgA1c, lipid panel and UACR and CMP. Adjust as needed is diet controlled currently.

## 2023-08-22 NOTE — Assessment & Plan Note (Signed)
 With OSA as well and needs to start zepbound  first 3 months titration sent in.

## 2023-08-22 NOTE — Assessment & Plan Note (Signed)
 Checking vitamin D  and adjust as needed.

## 2023-08-22 NOTE — Assessment & Plan Note (Signed)
 Using CPAP but average 4 hours per night.

## 2023-08-22 NOTE — Assessment & Plan Note (Signed)
 Worsening overall and checking B12, vitamin D  and CMP and MRI brain to assess.

## 2023-08-22 NOTE — Assessment & Plan Note (Signed)
 Some SOB still but using trelegy daily and albuterol  prn. Continue.

## 2023-08-23 ENCOUNTER — Telehealth: Payer: Self-pay | Admitting: Internal Medicine

## 2023-08-23 NOTE — Telephone Encounter (Unsigned)
 Copied from CRM [id number]. Topic: Clinical - Prescription Issue >> Aug 23, 2023  2:04 PM Martinique E wrote: Reason for CRM: [facility] has to re-do the prescriptions for all 3 Zepbound  medications. Relayed to patient that the pharmacy received these medications on 8/11, but they will not dispense them to the patient. Callback number for patient is [phone].

## 2023-08-26 NOTE — Telephone Encounter (Signed)
 Copied from CRM [id number]. Topic: Clinical - Medication Question >> Aug 26, 2023  1:39 PM Shereese L wrote: Reason for CRM: pharmacy told the patient that they weren't able to dispense medication because there are 3 different dosage amount being prescribed and that 1 dosage which is the lowest needs to be prescribed at a time tirzepatide  (ZEPBOUND ) 2.5 MG/0.5ML Pen

## 2023-08-27 ENCOUNTER — Telehealth: Payer: Self-pay

## 2023-08-27 ENCOUNTER — Other Ambulatory Visit (HOSPITAL_COMMUNITY): Payer: Self-pay

## 2023-08-27 NOTE — Telephone Encounter (Signed)
 Spoke with pharmacy and she stated that this medication is not on her formulary and also need to add DX code as well. I will send over to have a prior authorzation done. Do you want to resend in medication and add DX code or wait until prior authorization works on this?

## 2023-08-27 NOTE — Telephone Encounter (Signed)
 Pharmacy Patient Advocate Encounter   Received notification from Pt Calls Messages that prior authorization for Zepbound  2.5mg /0.71ml is required/requested.   Insurance verification completed.   The patient is insured through [place] .   Per test claim: PA required; PA submitted to above mentioned insurance via Latent Key/confirmation #/EOC [id number] Status is pending

## 2023-08-27 NOTE — Telephone Encounter (Signed)
 2.5 mg weekly to be dispensed first, then next month 5 mg weekly. This is very standard practice to send out several months at a time. Please have pharmacy dispense.

## 2023-08-27 NOTE — Telephone Encounter (Signed)
 If PA approved we can resend.

## 2023-08-27 NOTE — Telephone Encounter (Signed)
 Pharmacy Patient Advocate Encounter  Received notification from [employer] that Prior Authorization for Zepbound  2.5mg /0.17ml has been APPROVED from 08/26/23 to 01/08/24   PA #/Case ID/Reference #: [id number]

## 2023-09-03 NOTE — Progress Notes (Deleted)
 [facility] [address] [place] [address] Phone: [phone] Subjective:    I'm seeing this patient by the request  of:  Rollene Almarie LABOR, MD  CC: Bilateral knee pain  YEP:Dlagzrupcz  Toni Hicks is a 73 y.o. female coming in with complaint of B knee pain. Last seen in Nov 2024. Patient states    Past Medical History:  Diagnosis Date   ADD (attention deficit disorder)    Adult acne    Alcohol abuse    Allergic rhinitis    Allergy From birth   Anxiety and depression    Arthritis    Asthma    Congenital pneumonia    COPD (chronic obstructive pulmonary disease) (HCC)    Eczema    Hypertension    Hypothyroidism    Impaired fasting blood sugar    Mycoplasma pneumonia    Restless leg syndrome    Seasonal allergies    Sleep apnea    Stroke (HCC)    Substance abuse (HCC)    Urticaria    Past Surgical History:  Procedure Laterality Date   DILATION AND CURETTAGE OF UTERUS     @ 73 years old   TOTAL HIP ARTHROPLASTY Left 07/29/2018   Procedure: Left Anterior Hip Arthroplasty;  Surgeon: Sheril Coy, MD;  Location: [facility];  Service: Orthopedics;  Laterality: Left;   WISDOM TOOTH EXTRACTION     Social History   Socioeconomic History   Marital status: Widowed    Spouse name: has boyfriend Sam   Number of children: 2   Years of education: 17   Highest education level: Some college, no degree  Occupational History   Occupation: Product/process development scientist    Comment: Semi-retired  Tobacco Use   Smoking status: Former    Current packs/day: 0.00    Average packs/day: 2.0 packs/day for 15.0 years (30.0 ttl pk-yrs)    Types: Cigarettes    Start date: 51    Quit date: 1980    Years since quitting: 45.6    Passive exposure: Past   Smokeless tobacco: Never  Vaping Use   Vaping status: Former  Substance and Sexual Activity   Alcohol use: Not Currently    Alcohol/week: 1.0 - 2.0 standard drink of alcohol    Types: 1 - 2 Glasses  of wine per week    Comment: Maybe 1-2 glass / day   Drug use: No   Sexual activity: Not on file  Other Topics Concern   Not on file  Social History Narrative   HS[employer], [employer] MA- photography   Married '73- 10 years divorced, married '86- 10 years divorced, Married '96- 10 years- widowed.   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   Social Drivers of Corporate investment banker Strain: Low Risk  (03/05/2023)   Overall Financial Resource Strain (CARDIA)    Difficulty of Paying Living Expenses: Not hard at all  Food Insecurity: No Food Insecurity (03/05/2023)   Hunger Vital Sign    Worried About Running Out of Food in the Last Year: Never true    Ran Out of Food in the Last Year: Never true  Transportation Needs: No Transportation Needs (03/05/2023)   PRAPARE - Administrator, Civil Service (Medical): No    Lack of Transportation (Non-Medical): No  Physical Activity: Sufficiently Active (03/05/2023)   Exercise Vital Sign    Days of Exercise  per Week: 7 days    Minutes of Exercise per Session: 50 min  Stress: No Stress Concern Present (03/05/2023)   [employer] of Occupational Health - Occupational Stress Questionnaire    Feeling of Stress : Not at all  Social Connections: Moderately Isolated (03/05/2023)   Social Connection and Isolation Panel    Frequency of Communication with Friends and Family: More than three times a week    Frequency of Social Gatherings with Friends and Family: More than three times a week    Attends Religious Services: Never    Database administrator or Organizations: No    Attends Engineer, structural: Never    Marital Status: Living with partner   Allergies  Allergen Reactions   Astelin  [Azelastine ] Other (See Comments)    Per patient caused psychiatric illness   Nasonex  [Mometasone ] Other (See Comments)    Per patient caused psychiatric illness    Other     Can not eat carbohydrates without protein, is allergic to certain foods but can take in certain doses   Family History  Problem Relation Age of Onset   Cancer Mother        breast   Hypertension Mother    Dementia Mother    Stroke Mother    Breast cancer Mother    COPD Father    Heart failure Father    Coronary artery disease Father    Colon cancer Neg Hx    Rectal cancer Neg Hx    Stomach cancer Neg Hx    Esophageal cancer Neg Hx      Current Outpatient Medications (Cardiovascular):    amLODipine  (NORVASC ) 5 MG tablet, TAKE 1 TABLET BY MOUTH EVERY DAY   EPINEPHrine  0.3 mg/0.3 mL IJ SOAJ injection, Inject 0.3 mg into the muscle as needed for anaphylaxis.  Current Outpatient Medications (Respiratory):    albuterol  (VENTOLIN  HFA) 108 (90 Base) MCG/ACT inhaler, INHALE 1-2 PUFFS BY MOUTH EVERY 6 HOURS AS NEEDED FOR WHEEZE OR SHORTNESS OF BREATH   Fluticasone -Umeclidin-Vilant (TRELEGY ELLIPTA ) 100-62.5-25 MCG/ACT AEPB, Inhale 1 puff then rinse mouth, once daily   montelukast  (SINGULAIR ) 10 MG tablet, TAKE 1 TABLET BY MOUTH EVERYDAY AT BEDTIME  Current Outpatient Medications (Analgesics):    meloxicam  (MOBIC ) 7.5 MG tablet, TAKE 1 TABLET BY MOUTH EVERY DAY   Current Outpatient Medications (Other):    b complex vitamins tablet, Take 1 tablet by mouth 3 (three) times a week.   DULoxetine  (CYMBALTA ) 20 MG capsule, TAKE 1 CAPSULE BY MOUTH EVERY DAY   Dupilumab  (DUPIXENT ) 300 MG/2ML SOAJ, Inject 300 mg into the skin every 14 (fourteen) days.   hydrocortisone  2.5 % ointment, Apply topically 2 (two) times daily.   tirzepatide  (ZEPBOUND ) 2.5 MG/0.5ML Pen, Inject 2.5 mg into the skin once a week.   tirzepatide  (ZEPBOUND ) 5 MG/0.5ML Pen, INJECT 5 MG SUBCUTANEOUSLY WEEKLY   tirzepatide  (ZEPBOUND ) 7.5 MG/0.5ML Pen, Inject 7.5 mg into the skin once a week.   Vitamin D , Ergocalciferol , (DRISDOL ) 1.25 MG (50000 UNIT) CAPS capsule, TAKE 1 CAPSULE (50,000 UNITS TOTAL) BY MOUTH EVERY 7  (SEVEN) DAYS   Reviewed prior external information including notes and imaging from  primary care provider As well as notes that were available from care everywhere and other healthcare systems.  Past medical history, social, surgical and family history all reviewed in electronic medical record.  No pertanent information unless stated regarding to the chief complaint.   Review of Systems:  No headache, visual changes, nausea, vomiting, diarrhea,  constipation, dizziness, abdominal pain, skin rash, fevers, chills, night sweats, weight loss, swollen lymph nodes, body aches, joint swelling, chest pain, shortness of breath, mood changes. POSITIVE muscle aches  Objective  There were no vitals taken for this visit.   General: No apparent distress alert and oriented x3 mood and affect normal, dressed appropriately.  HEENT: Pupils equal, extraocular movements intact  Respiratory: Patient's speak in full sentences and does not appear short of breath  Cardiovascular: No lower extremity edema, non tender, no erythema  Bilateral knee exam shows arthritic changes noted.  Trace effusion noted.  After informed written and verbal consent, patient was seated on exam table. Right knee was prepped with alcohol swab and utilizing anterolateral approach, patient's right knee space was injected with 4:1  marcaine  0.5%: Kenalog  40mg /dL. Patient tolerated the procedure well without immediate complications.  After informed written and verbal consent, patient was seated on exam table. Left knee was prepped with alcohol swab and utilizing anterolateral approach, patient's left knee space was injected with 4:1  marcaine  0.5%: Kenalog  40mg /dL. Patient tolerated the procedure well without immediate complications.    Impression and Recommendations:    The above documentation has been reviewed and is accurate and complete Zanobia Griebel M Andersen Iorio, DO

## 2023-09-04 ENCOUNTER — Ambulatory Visit: Admitting: Family Medicine

## 2023-09-05 ENCOUNTER — Other Ambulatory Visit: Payer: Self-pay | Admitting: Family Medicine

## 2023-09-05 ENCOUNTER — Other Ambulatory Visit: Payer: Self-pay | Admitting: Internal Medicine

## 2023-09-05 NOTE — Progress Notes (Signed)
 [facility] [address] [place] [address] Phone: [phone] Subjective:   ISusannah Gully, am serving as a scribe for Dr. Arthea Claudene.  I'm seeing this patient by the request  of:  Rollene Almarie LABOR, MD  CC: Bilateral knee pain  YEP:Dlagzrupcz  11/27/2022 Arthritic changes of the knees but seems to be holding strong at the moment.  Patient has been the primary caregiver for her ailing husband at the moment.  We discussed worsening symptoms another injection could be beneficial.  Patient wants to hold off that on this for the moment.  Discussed with her about different treatment options that are available if needed.  Patient does seem to do better with talking.  Follow-up with me again 3 months otherwise.  Total time with patient today 36 minutes     Updated 09/11/2023 TYRESHIA INGMAN is a 73 y.o. female coming in with complaint of B knee pain. Patient states hasn't ben able to sleep, can't get weight down. Doesn't want any intervention with knees today. Uses a topical and takes some medication when needed. Sometimes knee gives way.   Past Medical History:  Diagnosis Date   ADD (attention deficit disorder)    Adult acne    Alcohol abuse    Allergic rhinitis    Allergy From birth   Anxiety and depression    Arthritis    Asthma    Congenital pneumonia    COPD (chronic obstructive pulmonary disease) (HCC)    Eczema    Hypertension    Hypothyroidism    Impaired fasting blood sugar    Mycoplasma pneumonia    Restless leg syndrome    Seasonal allergies    Sleep apnea    Stroke (HCC)    Substance abuse (HCC)    Urticaria    Past Surgical History:  Procedure Laterality Date   DILATION AND CURETTAGE OF UTERUS     @ 73 years old   TOTAL HIP ARTHROPLASTY Left 07/29/2018   Procedure: Left Anterior Hip Arthroplasty;  Surgeon: Sheril Coy, MD;  Location: [facility];  Service: Orthopedics;  Laterality: Left;   WISDOM TOOTH EXTRACTION      Social History   Socioeconomic History   Marital status: Widowed    Spouse name: has boyfriend Sam   Number of children: 2   Years of education: 17   Highest education level: Some college, no degree  Occupational History   Occupation: Product/process development scientist    Comment: Semi-retired  Tobacco Use   Smoking status: Former    Current packs/day: 0.00    Average packs/day: 2.0 packs/day for 15.0 years (30.0 ttl pk-yrs)    Types: Cigarettes    Start date: 97    Quit date: 1980    Years since quitting: 45.7    Passive exposure: Past   Smokeless tobacco: Never  Vaping Use   Vaping status: Former  Substance and Sexual Activity   Alcohol use: Not Currently    Alcohol/week: 1.0 - 2.0 standard drink of alcohol    Types: 1 - 2 Glasses of wine per week    Comment: Maybe 1-2 glass / day   Drug use: No   Sexual activity: Not on file  Other Topics Concern   Not on file  Social History Narrative   HS[employer], [employer] MA- photography   Married '73- 10 years divorced, married '86- 10 years divorced, Married '96- 10 years- widowed.   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   Social Drivers of Corporate investment banker Strain: Low Risk  (03/05/2023)   Overall Financial Resource Strain (CARDIA)    Difficulty of Paying Living Expenses: Not hard at all  Food Insecurity: No Food Insecurity (03/05/2023)   Hunger Vital Sign    Worried About Running Out of Food in the Last Year: Never true    Ran Out of Food in the Last Year: Never true  Transportation Needs: No Transportation Needs (03/05/2023)   PRAPARE - Administrator, Civil Service (Medical): No    Lack of Transportation (Non-Medical): No  Physical Activity: Sufficiently Active (03/05/2023)   Exercise Vital Sign    Days of Exercise per Week: 7 days    Minutes of Exercise per Session: 50 min  Stress: No Stress Concern Present (03/05/2023)   [employer] of Occupational Health - Occupational Stress Questionnaire    Feeling of Stress : Not at all  Social Connections: Moderately Isolated (03/05/2023)   Social Connection and Isolation Panel    Frequency of Communication with Friends and Family: More than three times a week    Frequency of Social Gatherings with Friends and Family: More than three times a week    Attends Religious Services: Never    Database administrator or Organizations: No    Attends Engineer, structural: Never    Marital Status: Living with partner   Allergies  Allergen Reactions   Astelin  [Azelastine ] Other (See Comments)    Per patient caused psychiatric illness   Nasonex  [Mometasone ] Other (See Comments)    Per patient caused psychiatric illness   Other     Can not eat carbohydrates without protein, is allergic to certain foods but can take in certain doses   Family History  Problem Relation Age of Onset   Cancer Mother        breast   Hypertension Mother    Dementia Mother    Stroke Mother    Breast cancer Mother    COPD Father    Heart failure Father    Coronary artery disease Father    Colon cancer Neg Hx    Rectal cancer Neg Hx    Stomach cancer Neg Hx    Esophageal cancer Neg Hx      Current Outpatient Medications (Cardiovascular):    amLODipine  (NORVASC ) 5 MG tablet, TAKE 1 TABLET BY MOUTH EVERY DAY   EPINEPHrine  0.3 mg/0.3 mL IJ SOAJ injection, Inject 0.3 mg into the muscle as needed for anaphylaxis.  Current Outpatient Medications (Respiratory):    albuterol  (VENTOLIN  HFA) 108 (90 Base) MCG/ACT inhaler, INHALE 1-2 PUFFS BY MOUTH EVERY 6 HOURS AS NEEDED FOR WHEEZE OR SHORTNESS OF BREATH   montelukast  (SINGULAIR ) 10 MG tablet, TAKE 1 TABLET BY MOUTH EVERYDAY AT BEDTIME   TRELEGY ELLIPTA  100-62.5-25 MCG/ACT AEPB, INHALE 1 PUFF THEN RINSE MOUTH, ONCE DAILY  Current Outpatient Medications (Analgesics):    meloxicam  (MOBIC ) 15 MG tablet, Take 1 tablet (15 mg total) by mouth  daily.   meloxicam  (MOBIC ) 7.5 MG tablet, TAKE 1 TABLET BY MOUTH EVERY DAY   Current Outpatient Medications (Other):    DULoxetine  (CYMBALTA ) 30 MG capsule, Take 1 capsule (30 mg total) by mouth daily.   b complex vitamins tablet, Take 1 tablet by mouth 3 (three) times a week.   DULoxetine  (CYMBALTA ) 20 MG capsule, TAKE 1 CAPSULE BY MOUTH EVERY DAY   Dupilumab  (DUPIXENT ) 300 MG/2ML  SOAJ, Inject 300 mg into the skin every 14 (fourteen) days.   hydrocortisone  2.5 % ointment, Apply topically 2 (two) times daily.   tirzepatide  (ZEPBOUND ) 2.5 MG/0.5ML Pen, Inject 2.5 mg into the skin once a week.   tirzepatide  (ZEPBOUND ) 5 MG/0.5ML Pen, INJECT 5 MG SUBCUTANEOUSLY WEEKLY   tirzepatide  (ZEPBOUND ) 7.5 MG/0.5ML Pen, Inject 7.5 mg into the skin once a week.   Vitamin D , Ergocalciferol , (DRISDOL ) 1.25 MG (50000 UNIT) CAPS capsule, Take 1 capsule (50,000 Units total) by mouth every 7 (seven) days.   Reviewed prior external information including notes and imaging from  primary care provider As well as notes that were available from care everywhere and other healthcare systems.  Past medical history, social, surgical and family history all reviewed in electronic medical record.  No pertanent information unless stated regarding to the chief complaint.   Review of Systems:  No headache, visual changes, nausea, vomiting, diarrhea, constipation, dizziness, abdominal pain, skin rash, fevers, chills, night sweats, weight loss, swollen lymph nodes, body aches, joint swelling, chest pain, shortness of breath, mood changes. POSITIVE muscle aches  Objective  Blood pressure 114/68, pulse 82, height 5' 1 (1.549 m), weight 201 lb (91.2 kg), SpO2 96%.   General: No apparent distress alert and oriented x3 mood and affect normal, dressed appropriately.  HEENT: Pupils equal, extraocular movements intact  Respiratory: Patient's speak in full sentences and does not appear short of breath  Cardiovascular: No lower  extremity edema, non tender, no erythema  Bilateral knee exam shows arthritic changes noted.  Trace effusion noted.  Instability noted with valgus and varus force as well.  Patient has tenderness to palpation over the greater trochanteric area as well.   Impression and Recommendations:    The above documentation has been reviewed and is accurate and complete Laisa Larrick M Briceida Rasberry, DO

## 2023-09-06 ENCOUNTER — Encounter: Payer: Self-pay | Admitting: Internal Medicine

## 2023-09-11 ENCOUNTER — Ambulatory Visit: Admitting: Family Medicine

## 2023-09-11 VITALS — BP 114/68 | HR 82 | Ht 61.0 in | Wt 201.0 lb

## 2023-09-11 DIAGNOSIS — M1612 Unilateral primary osteoarthritis, left hip: Secondary | ICD-10-CM | POA: Diagnosis not present

## 2023-09-11 DIAGNOSIS — M51362 Other intervertebral disc degeneration, lumbar region with discogenic back pain and lower extremity pain: Secondary | ICD-10-CM | POA: Diagnosis not present

## 2023-09-11 DIAGNOSIS — M170 Bilateral primary osteoarthritis of knee: Secondary | ICD-10-CM

## 2023-09-11 DIAGNOSIS — E66813 Obesity, class 3: Secondary | ICD-10-CM | POA: Diagnosis not present

## 2023-09-11 MED ORDER — DULOXETINE HCL 30 MG PO CPEP: 30.0000 mg | ORAL_CAPSULE | Freq: Every day | ORAL | 1 refills | Status: DC

## 2023-09-11 MED ORDER — MELOXICAM 15 MG PO TABS: 15.0000 mg | ORAL_TABLET | Freq: Every day | ORAL | 1 refills | Status: DC

## 2023-09-11 MED ORDER — VITAMIN D (ERGOCALCIFEROL) 1.25 MG (50000 UNIT) PO CAPS: 50000.0000 [IU] | ORAL_CAPSULE | ORAL | 0 refills | Status: AC

## 2023-09-11 NOTE — Assessment & Plan Note (Signed)
 Known arthritic changes, patient wants to avoid any type of injection at this moment.  Discussed icing regimen and home exercises.  Patient will continue to attempt to lose weight.  She has lost enough weight that she would be a surgical candidate if necessary.  Patient still wants to avoid and will continue to be active otherwise.  Follow-up with me again 3 months

## 2023-09-11 NOTE — Assessment & Plan Note (Signed)
 Believe some of the hip pain is actually secondary to more of the back.  Discussed icing regimen and home exercises, increase activity slowly.  Discussed avoiding which activities to do and which ones to avoid.  Increase activity slowly.  Follow-up again in 6 to 8 weeks

## 2023-09-11 NOTE — Assessment & Plan Note (Signed)
 Due to the chronic pain patient is having tried the Cymbalta  at a higher dose of 30 mg and increase meloxicam  to 15 mg daily.  Patient's will monitor any type of side effects and we discussed them in greater detail.

## 2023-09-11 NOTE — Assessment & Plan Note (Signed)
 Continue to work on weight loss, would like BMI under 35

## 2023-09-11 NOTE — Patient Instructions (Addendum)
 Good to see you. Cymbalta  30 mg daily. Once weekly Vit D 50,000 mg. Meloxicam  15 mg. Discontinue 7.5 mg. GT bursitis Hep. Ice after activity and before bed.  See me again in 2 months.

## 2023-09-13 ENCOUNTER — Other Ambulatory Visit: Payer: Self-pay

## 2023-09-14 ENCOUNTER — Observation Stay (HOSPITAL_COMMUNITY)
Admission: EM | Admit: 2023-09-14 | Discharge: 2023-09-16 | Disposition: A | Discharge status: Home / Self Care | Attending: Internal Medicine | Admitting: Internal Medicine

## 2023-09-14 ENCOUNTER — Other Ambulatory Visit (HOSPITAL_COMMUNITY)

## 2023-09-14 ENCOUNTER — Other Ambulatory Visit: Payer: Self-pay

## 2023-09-14 ENCOUNTER — Emergency Department (HOSPITAL_COMMUNITY)

## 2023-09-14 ENCOUNTER — Observation Stay (HOSPITAL_COMMUNITY)

## 2023-09-14 ENCOUNTER — Encounter (HOSPITAL_COMMUNITY): Payer: Self-pay | Admitting: *Deleted

## 2023-09-14 DIAGNOSIS — R4781 Slurred speech: Secondary | ICD-10-CM | POA: Insufficient documentation

## 2023-09-14 DIAGNOSIS — R29810 Facial weakness: Secondary | ICD-10-CM | POA: Diagnosis not present

## 2023-09-14 DIAGNOSIS — F10920 Alcohol use, unspecified with intoxication, uncomplicated: Secondary | ICD-10-CM | POA: Insufficient documentation

## 2023-09-14 DIAGNOSIS — G4700 Insomnia, unspecified: Secondary | ICD-10-CM | POA: Insufficient documentation

## 2023-09-14 DIAGNOSIS — I6381 Other cerebral infarction due to occlusion or stenosis of small artery: Secondary | ICD-10-CM | POA: Diagnosis not present

## 2023-09-14 DIAGNOSIS — Z79899 Other long term (current) drug therapy: Secondary | ICD-10-CM | POA: Diagnosis not present

## 2023-09-14 DIAGNOSIS — R200 Anesthesia of skin: Secondary | ICD-10-CM | POA: Diagnosis not present

## 2023-09-14 DIAGNOSIS — I6782 Cerebral ischemia: Secondary | ICD-10-CM | POA: Diagnosis not present

## 2023-09-14 DIAGNOSIS — Z7982 Long term (current) use of aspirin: Secondary | ICD-10-CM | POA: Diagnosis not present

## 2023-09-14 DIAGNOSIS — R9082 White matter disease, unspecified: Secondary | ICD-10-CM | POA: Diagnosis not present

## 2023-09-14 DIAGNOSIS — R29818 Other symptoms and signs involving the nervous system: Secondary | ICD-10-CM | POA: Diagnosis not present

## 2023-09-14 DIAGNOSIS — I10 Essential (primary) hypertension: Secondary | ICD-10-CM | POA: Diagnosis not present

## 2023-09-14 DIAGNOSIS — J449 Chronic obstructive pulmonary disease, unspecified: Secondary | ICD-10-CM | POA: Diagnosis not present

## 2023-09-14 DIAGNOSIS — I639 Cerebral infarction, unspecified: Principal | ICD-10-CM | POA: Diagnosis present

## 2023-09-14 DIAGNOSIS — E785 Hyperlipidemia, unspecified: Secondary | ICD-10-CM | POA: Diagnosis not present

## 2023-09-14 DIAGNOSIS — R29703 NIHSS score 3: Secondary | ICD-10-CM

## 2023-09-14 DIAGNOSIS — I6389 Other cerebral infarction: Principal | ICD-10-CM | POA: Insufficient documentation

## 2023-09-14 DIAGNOSIS — I69398 Other sequelae of cerebral infarction: Secondary | ICD-10-CM | POA: Diagnosis not present

## 2023-09-14 DIAGNOSIS — I6529 Occlusion and stenosis of unspecified carotid artery: Secondary | ICD-10-CM | POA: Diagnosis not present

## 2023-09-14 DIAGNOSIS — I672 Cerebral atherosclerosis: Secondary | ICD-10-CM | POA: Diagnosis not present

## 2023-09-14 LAB — COMPREHENSIVE METABOLIC PANEL WITH GFR
ALT: 39 U/L (ref 0–44)
AST: 43 U/L — ABNORMAL HIGH (ref 15–41)
Albumin: 4.1 g/dL (ref 3.5–5.0)
Alkaline Phosphatase: 84 U/L (ref 38–126)
Anion gap: 13 (ref 5–15)
BUN: 8 mg/dL (ref 8–23)
CO2: 22 mmol/L (ref 22–32)
Calcium: 9.1 mg/dL (ref 8.9–10.3)
Chloride: 105 mmol/L (ref 98–111)
Creatinine, Ser: 0.73 mg/dL (ref 0.44–1.00)
GFR, Estimated: >60 mL/min (ref >60)
Glucose, Bld: 146 mg/dL — ABNORMAL HIGH (ref 70–99)
Potassium: 3.6 mmol/L (ref 3.5–5.1)
Sodium: 140 mmol/L (ref 135–145)
Total Bilirubin: 0.5 mg/dL (ref 0.0–1.2)
Total Protein: 7.6 g/dL (ref 6.5–8.1)

## 2023-09-14 LAB — DIFFERENTIAL
Abs Immature Granulocytes: 0.02 K/uL (ref 0.00–0.07)
Basophils Absolute: 0.1 K/uL (ref 0.0–0.1)
Basophils Relative: 1 %
Eosinophils Absolute: 0.2 K/uL (ref 0.0–0.5)
Eosinophils Relative: 2 %
Immature Granulocytes: 0 %
Lymphocytes Relative: 26 %
Lymphs Abs: 1.9 K/uL (ref 0.7–4.0)
Monocytes Absolute: 0.8 K/uL (ref 0.1–1.0)
Monocytes Relative: 12 %
Neutro Abs: 4.3 K/uL (ref 1.7–7.7)
Neutrophils Relative %: 59 %

## 2023-09-14 LAB — I-STAT CHEM 8, ED
BUN: 9 mg/dL (ref 8–23)
Calcium, Ion: 1.09 mmol/L — ABNORMAL LOW (ref 1.15–1.40)
Chloride: 105 mmol/L (ref 98–111)
Creatinine, Ser: 0.6 mg/dL (ref 0.44–1.00)
Glucose, Bld: 147 mg/dL — ABNORMAL HIGH (ref 70–99)
HCT: 45 % (ref 36.0–46.0)
Hemoglobin: 15.3 g/dL — ABNORMAL HIGH (ref 12.0–15.0)
Potassium: 3.7 mmol/L (ref 3.5–5.1)
Sodium: 142 mmol/L (ref 135–145)
TCO2: 24 mmol/L (ref 22–32)

## 2023-09-14 LAB — LIPID PANEL
Cholesterol: 161 mg/dL (ref 0–200)
HDL: 55 mg/dL (ref >40)
LDL Cholesterol: 90 mg/dL (ref 0–99)
Total CHOL/HDL Ratio: 2.9 ratio
Triglycerides: 78 mg/dL (ref <150)
VLDL: 16 mg/dL (ref 0–40)

## 2023-09-14 LAB — CBC
HCT: 45.8 % (ref 36.0–46.0)
Hemoglobin: 15.1 g/dL — ABNORMAL HIGH (ref 12.0–15.0)
MCH: 28.8 pg (ref 26.0–34.0)
MCHC: 33 g/dL (ref 30.0–36.0)
MCV: 87.4 fL (ref 80.0–100.0)
Platelets: 265 K/uL (ref 150–400)
RBC: 5.24 MIL/uL — ABNORMAL HIGH (ref 3.87–5.11)
RDW: 13.4 % (ref 11.5–15.5)
WBC: 7.2 K/uL (ref 4.0–10.5)
nRBC: 0 % (ref 0.0–0.2)

## 2023-09-14 LAB — HEMOGLOBIN A1C
Hgb A1c MFr Bld: 6.4 % — ABNORMAL HIGH (ref 4.8–5.6)
Mean Plasma Glucose: 136.98 mg/dL

## 2023-09-14 LAB — RAPID URINE DRUG SCREEN, HOSP PERFORMED
Amphetamines: NOT DETECTED
Barbiturates: NOT DETECTED
Benzodiazepines: NOT DETECTED
Cocaine: NOT DETECTED
Opiates: NOT DETECTED
Tetrahydrocannabinol: POSITIVE — AB

## 2023-09-14 LAB — ETHANOL: Alcohol, Ethyl (B): <15 mg/dL (ref <15)

## 2023-09-14 LAB — PROTIME-INR
INR: 0.9 (ref 0.8–1.2)
Prothrombin Time: 13.2 s (ref 11.4–15.2)

## 2023-09-14 LAB — CBG MONITORING, ED: Glucose-Capillary: 144 mg/dL — ABNORMAL HIGH (ref 70–99)

## 2023-09-14 LAB — APTT: aPTT: 37 s — ABNORMAL HIGH (ref 24–36)

## 2023-09-14 MED ORDER — ALBUTEROL SULFATE (2.5 MG/3ML) 0.083% IN NEBU: 3.0000 mL | INHALATION_SOLUTION | Freq: Four times a day (QID) | RESPIRATORY_TRACT | Status: DC | PRN

## 2023-09-14 MED ORDER — ACETAMINOPHEN 650 MG RE SUPP
650.0000 mg | RECTAL | Status: DC | PRN
Start: 2023-09-14 — End: 2023-09-16

## 2023-09-14 MED ORDER — STROKE: EARLY STAGES OF RECOVERY BOOK
Freq: Once | Status: DC
  Filled 2023-09-14: qty 1

## 2023-09-14 MED ORDER — SODIUM CHLORIDE 0.9 % IV SOLN: INTRAVENOUS | Status: DC

## 2023-09-14 MED ORDER — FOLIC ACID 1 MG PO TABS
1.0000 mg | ORAL_TABLET | Freq: Every day | ORAL | Status: DC
  Administered 2023-09-14 – 2023-09-16 (×3): 1 mg via ORAL
  Filled 2023-09-14 (×3): qty 1

## 2023-09-14 MED ORDER — ADULT MULTIVITAMIN W/MINERALS CH
1.0000 | ORAL_TABLET | Freq: Every day | ORAL | Status: DC
  Administered 2023-09-14 – 2023-09-16 (×3): 1 via ORAL
  Filled 2023-09-14 (×3): qty 1

## 2023-09-14 MED ORDER — THIAMINE HCL 100 MG/ML IJ SOLN
100.0000 mg | Freq: Every day | INTRAMUSCULAR | Status: DC
  Filled 2023-09-14 (×2): qty 2

## 2023-09-14 MED ORDER — ACETAMINOPHEN 160 MG/5ML PO SOLN
650.0000 mg | ORAL | Status: DC | PRN
Start: 2023-09-14 — End: 2023-09-16

## 2023-09-14 MED ORDER — MELATONIN 5 MG PO TABS: 5.0000 mg | ORAL_TABLET | Freq: Every evening | ORAL | Status: DC | PRN

## 2023-09-14 MED ORDER — ACETAMINOPHEN 325 MG PO TABS
650.0000 mg | ORAL_TABLET | ORAL | Status: DC | PRN
  Administered 2023-09-14: 650 mg via ORAL
  Filled 2023-09-14: qty 2

## 2023-09-14 MED ORDER — HEPARIN SODIUM (PORCINE) 5000 UNIT/ML IJ SOLN
5000.0000 [IU] | Freq: Two times a day (BID) | INTRAMUSCULAR | Status: DC
  Administered 2023-09-14 – 2023-09-15 (×3): 5000 [IU] via SUBCUTANEOUS
  Filled 2023-09-14 (×3): qty 1

## 2023-09-14 MED ORDER — LORAZEPAM 2 MG/ML IJ SOLN
1.0000 mg | INTRAMUSCULAR | Status: DC | PRN
  Administered 2023-09-15: 2 mg via INTRAVENOUS
  Filled 2023-09-14: qty 1

## 2023-09-14 MED ORDER — CLOPIDOGREL BISULFATE 75 MG PO TABS
300.0000 mg | ORAL_TABLET | Freq: Once | ORAL | Status: AC
  Administered 2023-09-15: 300 mg via ORAL
  Filled 2023-09-14: qty 4

## 2023-09-14 MED ORDER — THIAMINE MONONITRATE 100 MG PO TABS
100.0000 mg | ORAL_TABLET | Freq: Every day | ORAL | Status: DC
  Administered 2023-09-14 – 2023-09-16 (×3): 100 mg via ORAL
  Filled 2023-09-14 (×3): qty 1

## 2023-09-14 MED ORDER — LORAZEPAM 1 MG PO TABS: 1.0000 mg | ORAL_TABLET | ORAL | Status: DC | PRN

## 2023-09-14 MED ORDER — IOHEXOL 350 MG/ML SOLN
75.0000 mL | Freq: Once | INTRAVENOUS | Status: AC | PRN
  Administered 2023-09-14: 75 mL via INTRAVENOUS

## 2023-09-14 MED ORDER — MONTELUKAST SODIUM 10 MG PO TABS
10.0000 mg | ORAL_TABLET | Freq: Every day | ORAL | Status: DC
  Administered 2023-09-14 – 2023-09-15 (×2): 10 mg via ORAL
  Filled 2023-09-14 (×2): qty 1

## 2023-09-14 MED ORDER — DIAZEPAM 5 MG/ML IJ SOLN
5.0000 mg | Freq: Once | INTRAMUSCULAR | Status: AC
  Administered 2023-09-14: 5 mg via INTRAVENOUS
  Filled 2023-09-14: qty 2

## 2023-09-14 MED ORDER — ASPIRIN 81 MG PO TBEC
81.0000 mg | DELAYED_RELEASE_TABLET | Freq: Every day | ORAL | Status: DC
  Administered 2023-09-16: 81 mg via ORAL
  Filled 2023-09-14 (×2): qty 1

## 2023-09-14 MED ORDER — ASPIRIN 325 MG PO TABS
325.0000 mg | ORAL_TABLET | Freq: Once | ORAL | Status: AC
  Administered 2023-09-15: 325 mg via ORAL
  Filled 2023-09-14: qty 1

## 2023-09-14 MED ORDER — CLOPIDOGREL BISULFATE 75 MG PO TABS
75.0000 mg | ORAL_TABLET | Freq: Every day | ORAL | Status: DC
  Administered 2023-09-16: 75 mg via ORAL
  Filled 2023-09-14 (×2): qty 1

## 2023-09-14 MED ORDER — BUDESON-GLYCOPYRROL-FORMOTEROL 160-9-4.8 MCG/ACT IN AERO
2.0000 | INHALATION_SPRAY | Freq: Two times a day (BID) | RESPIRATORY_TRACT | Status: DC
  Administered 2023-09-15 – 2023-09-16 (×3): 2 via RESPIRATORY_TRACT
  Filled 2023-09-14: qty 5.9

## 2023-09-14 NOTE — ED Triage Notes (Signed)
 Patient presents to ed c/o facial droop this am went she woke up LKN  12 mn. Hx of previous CVA with weakness on left  side

## 2023-09-14 NOTE — Evaluation (Signed)
 Speech Language Pathology Evaluation Patient Details Name: Toni Hicks MRN: [id number] DOB: December 05, 1950 Today's Date: 09/14/2023 Time: 8458-8441 SLP Time Calculation (min) (ACUTE ONLY): 17 min  Problem List:  Patient Active Problem List   Diagnosis Date Noted   Facial droop due to acute stroke (HCC) 09/14/2023   Memory change 01/21/2023   Class 3 severe obesity due to excess calories with serious comorbidity in adult 05/16/2022   Tinnitus of both ears 12/04/2021   COPD mixed type (HCC) 03/20/2021   Diabetes mellitus type 2 with complications (HCC) 01/20/2021   Eczema 12/09/2019   Vitamin D  deficiency 09/11/2019   Degenerative arthritis of knee, bilateral 08/06/2019   Degenerative disc disease, lumbar 08/06/2019   Morbid obesity (HCC) 08/06/2019   Primary osteoarthritis of left hip 07/29/2018   History of completed stroke 02/12/2018   Moderate persistent asthma, uncomplicated 01/29/2018   Nasal polyposis 01/29/2018   Nasal septal perforation 01/29/2018   Anxiety 01/21/2018   Arthritis of left hip 01/14/2018   Obstructive sleep apnea treated with continuous positive airway pressure (CPAP) 09/18/2017   Insomnia 09/18/2017   Small vessel disease, cerebrovascular 09/17/2017   RLS (restless legs syndrome) 03/11/2017   Alcohol dependence in remission (HCC) 03/11/2017   Stress incontinence 11/13/2016   Encounter for general adult medical examination with abnormal findings 12/10/2010   Essential hypertension 11/14/2007   Allergic rhinitis due to allergen 11/14/2007   Past Medical History:  Past Medical History:  Diagnosis Date   ADD (attention deficit disorder)    Adult acne    Alcohol abuse    Allergic rhinitis    Allergy From birth   Anxiety and depression    Arthritis    Asthma    Congenital pneumonia    COPD (chronic obstructive pulmonary disease) (HCC)    Eczema    Hypertension    Hypothyroidism    Impaired fasting blood sugar    Mycoplasma pneumonia    Restless  leg syndrome    Seasonal allergies    Sleep apnea    Stroke (HCC)    Substance abuse (HCC)    Urticaria    Past Surgical History:  Past Surgical History:  Procedure Laterality Date   DILATION AND CURETTAGE OF UTERUS     @ 73 years old   TOTAL HIP ARTHROPLASTY Left 07/29/2018   Procedure: Left Anterior Hip Arthroplasty;  Surgeon: Sheril Coy, MD;  Location: [facility];  Service: Orthopedics;  Laterality: Left;   WISDOM TOOTH EXTRACTION     HPI:  Patient is a 73 y.o. female with PMH: CVA (10 years ago), COPD, hypothyroidism, OSA, tobacco abuse, ETOH abuse, essential HTN. She presented to the hospital on 09/14/2023 with left facial droop and sensation that her tongue was bigger than the size of her mouth. MRI brain showed an acute lacunar infarct of the right posterior frontal lobe, chronic lacunar infarct in the left posterior thalamus.   Assessment / Plan / Recommendation Clinical Impression  Patient presents with a mild dysarthria of speech that reduces the quality of her speech but not the overall intelligibility. Cognition and language abilities appear WNL. Patient demonstrates awareness to her left facial numbness and weakness and left bilabial weakness. She denies any UE or LE weakness or numbness. During OME, patient observed to have lingual deviation to right when protruding as well as reduced lingual elevation. She did indicate that her speech is sounding better now compared to initial presentation. SLP not recommending skilled intervention at this time but encouraged patient to reach  out to her PCP in the future if her symptoms don't continue to improve.    SLP Assessment  SLP Recommendation/Assessment: Patient does not need any further Speech Language Pathology Services SLP Visit Diagnosis: Dysarthria and anarthria (R47.1)     Assistance Recommended at Discharge  None  Functional Status Assessment Patient has had a recent decline in their functional status and demonstrates the  ability to make significant improvements in function in a reasonable and predictable amount of time.  Frequency and Duration           SLP Evaluation Cognition  Overall Cognitive Status: Within Functional Limits for tasks assessed Arousal/Alertness: Awake/alert Orientation Level: Oriented X4       Comprehension  Auditory Comprehension Overall Auditory Comprehension: Appears within functional limits for tasks assessed    Expression Expression Primary Mode of Expression: Verbal Verbal Expression Overall Verbal Expression: Appears within functional limits for tasks assessed   Oral / Motor  Oral Motor/Sensory Function Overall Oral Motor/Sensory Function: Mild impairment Facial ROM: Reduced left;Suspected CN VII (facial) dysfunction Facial Symmetry: Abnormal symmetry left;Suspected CN VII (facial) dysfunction Facial Sensation: Reduced left;Suspected CN V (Trigeminal) dysfunction Lingual ROM: Reduced left Lingual Symmetry: Abnormal symmetry left Lingual Strength: Within Functional Limits Lingual Sensation: Within Functional Limits Velum: Within Functional Limits Mandible: Within Functional Limits Motor Speech Overall Motor Speech: Impaired Respiration: Within functional limits Resonance: Within functional limits Articulation: Impaired Level of Impairment: Conversation Intelligibility: Intelligible Motor Speech Errors: Not applicable Effective Techniques: Slow rate            Norleen IVAR Blase, MA, CCC-SLP Speech Therapy

## 2023-09-14 NOTE — H&P (Signed)
 History and Physical    Patient: Toni Hicks FMW:[id number] DOB: 03/02/1950 DOA: 09/14/2023 DOS: the patient was seen and examined on 09/14/2023 . PCP: Rollene Almarie LABOR, MD  Patient coming from: Home Chief complaint: Chief Complaint  Patient presents with   Facial Droop   HPI:  Toni Hicks is a 73 y.o. female with past medical history  of allergy to Astelin  Nasonex ,Essential hypertension, COPD, hypothyroidism, sleep apnea, history of stroke, history of tobacco abuse, history of alcohol abuse presenting today with left facial droop that had occurred today morning at around 6 AM and last known well was 12 midnight.  Patient states her tongue felt a little odd and it like it was bigger than the size of her mouth.  Patient states that her last stroke was about 10 years ago and she has been told that she was a prediabetic and since then has controlled everything with diet.  At bedside patient is alert awake oriented gives history and is otherwise nonfocal.  ED Course:  Vital signs in the ED were notable for the following:  Vitals:   09/14/23 0738 09/14/23 0741 09/14/23 1120 09/14/23 1123  BP: (!) 150/101  135/88   Pulse: 74  65   Temp: (!) 97.3 F (36.3 C)   97.6 F (36.4 C)  Resp: 18  17   Height:  5' 1 (1.549 m)    Weight:  88.5 kg    SpO2: 97%  100%   TempSrc:    Oral  BMI (Calculated):  36.86    >>ED evaluation thus far shows: CMP shows glucose 146 AST 43 normal kidney function and normal electrolytes otherwise. CBC shows white count 7.2 hemoglobin 15.1 platelets of 265. UDS shows THC. Alcohol level less than 15. MRI today showed an acute lacunar infarct of the right posterior frontal lobe, chronic lacunar infarct in the left posterior thalamus.   >>While in the ED patient received the following: medications  aspirin  tablet 325 mg (has no administration in time range)    Followed by  aspirin  EC tablet 81 mg (has no administration in time range)  clopidogrel   (PLAVIX ) tablet 300 mg (has no administration in time range)    Followed by  clopidogrel  (PLAVIX ) tablet 75 mg (has no administration in time range)   Review of Systems  Neurological:  Positive for focal weakness and weakness.   Past Medical History:  Diagnosis Date   ADD (attention deficit disorder)    Adult acne    Alcohol abuse    Allergic rhinitis    Allergy From birth   Anxiety and depression    Arthritis    Asthma    Congenital pneumonia    COPD (chronic obstructive pulmonary disease) (HCC)    Eczema    Hypertension    Hypothyroidism    Impaired fasting blood sugar    Mycoplasma pneumonia    Restless leg syndrome    Seasonal allergies    Sleep apnea    Stroke (HCC)    Substance abuse (HCC)    Urticaria    Past Surgical History:  Procedure Laterality Date   DILATION AND CURETTAGE OF UTERUS     @ 73 years old   TOTAL HIP ARTHROPLASTY Left 07/29/2018   Procedure: Left Anterior Hip Arthroplasty;  Surgeon: Sheril Coy, MD;  Location: [facility];  Service: Orthopedics;  Laterality: Left;   WISDOM TOOTH EXTRACTION      reports that she quit smoking about 45 years ago. Her smoking use included  cigarettes. She started smoking about 60 years ago. She has a 30 pack-year smoking history. She has been exposed to tobacco smoke. She has never used smokeless tobacco. She reports current alcohol use of about 1.0 - 2.0 standard drink of alcohol per week. She reports that she does not use drugs. Allergies  Allergen Reactions   Astelin  [Azelastine ] Other (See Comments)    Per patient caused psychiatric illness   Nasonex  [Mometasone ] Other (See Comments)    Per patient caused psychiatric illness   Other     Can not eat carbohydrates without protein, is allergic to certain foods but can take in certain doses   Family History  Problem Relation Age of Onset   Cancer Mother        breast   Hypertension Mother    Dementia Mother    Stroke Mother    Breast cancer Mother    COPD  Father    Heart failure Father    Coronary artery disease Father    Colon cancer Neg Hx    Rectal cancer Neg Hx    Stomach cancer Neg Hx    Esophageal cancer Neg Hx    Prior to Admission medications   Medication Sig Start Date End Date Taking? Authorizing Provider  Dupilumab  (DUPIXENT ) 300 MG/2ML SOAJ Inject 300 mg into the skin every 14 (fourteen) days. 04/24/23  Yes Young, Reggy D, MD  albuterol  (VENTOLIN  HFA) 108 (90 Base) MCG/ACT inhaler INHALE 1-2 PUFFS BY MOUTH EVERY 6 HOURS AS NEEDED FOR WHEEZE OR SHORTNESS OF BREATH 03/11/23   Mannam, Praveen, MD  amLODipine  (NORVASC ) 5 MG tablet TAKE 1 TABLET BY MOUTH EVERY DAY 08/13/23   Rollene Almarie LABOR, MD  b complex vitamins tablet Take 1 tablet by mouth 3 (three) times a week.    [provider]  DULoxetine  (CYMBALTA ) 20 MG capsule TAKE 1 CAPSULE BY MOUTH EVERY DAY 06/10/23   Claudene Arthea HERO, DO  DULoxetine  (CYMBALTA ) 30 MG capsule Take 1 capsule (30 mg total) by mouth daily. 09/11/23   Claudene Arthea HERO, DO  EPINEPHrine  0.3 mg/0.3 mL IJ SOAJ injection Inject 0.3 mg into the muscle as needed for anaphylaxis. 04/27/20   Neysa Reggy D, MD  hydrocortisone  2.5 % ointment Apply topically 2 (two) times daily. 02/12/20   Jeneal Danita Macintosh, MD  meloxicam  (MOBIC ) 15 MG tablet Take 1 tablet (15 mg total) by mouth daily. 09/11/23   Claudene Arthea HERO, DO  meloxicam  (MOBIC ) 7.5 MG tablet TAKE 1 TABLET BY MOUTH EVERY DAY 08/12/23   Smith, Zachary M, DO  montelukast  (SINGULAIR ) 10 MG tablet TAKE 1 TABLET BY MOUTH EVERYDAY AT BEDTIME 07/07/23   Young, Reggy D, MD  tirzepatide  (ZEPBOUND ) 2.5 MG/0.5ML Pen Inject 2.5 mg into the skin once a week. 08/19/23   Rollene Almarie LABOR, MD  tirzepatide  (ZEPBOUND ) 5 MG/0.5ML Pen INJECT 5 MG SUBCUTANEOUSLY WEEKLY 08/19/23   Rollene Almarie LABOR, MD  tirzepatide  (ZEPBOUND ) 7.5 MG/0.5ML Pen Inject 7.5 mg into the skin once a week. 08/19/23   Rollene Almarie LABOR, MD  TRELEGY ELLIPTA  100-62.5-25 MCG/ACT AEPB INHALE  1 PUFF THEN RINSE MOUTH, ONCE DAILY 09/05/23   Neysa Reggy D, MD  Vitamin D , Ergocalciferol , (DRISDOL ) 1.25 MG (50000 UNIT) CAPS capsule Take 1 capsule (50,000 Units total) by mouth every 7 (seven) days. 09/11/23   Smith, Zachary M, DO  Vitals:   09/14/23 0738 09/14/23 0741 09/14/23 1120 09/14/23 1123  BP: (!) 150/101  135/88   Pulse: 74  65   Resp: 18  17   Temp: (!) 97.3 F (36.3 C)   97.6 F (36.4 C)  TempSrc:    Oral  SpO2: 97%  100%   Weight:  88.5 kg    Height:  5' 1 (1.549 m)     Physical Exam Vitals and nursing note reviewed.  Constitutional:      General: She is not in acute distress.    Appearance: She is obese. She is not ill-appearing.  HENT:     Head: Normocephalic and atraumatic.     Right Ear: Hearing and external ear normal.     Left Ear: Hearing and external ear normal.     Nose: No nasal deformity.     Mouth/Throat:     Lips: Pink.  Eyes:     General: Lids are normal.     Extraocular Movements: Extraocular movements intact.  Neck:     Vascular: No carotid bruit.  Cardiovascular:     Rate and Rhythm: Normal rate and regular rhythm.     Pulses: Normal pulses.     Heart sounds: Normal heart sounds.  Pulmonary:     Effort: Pulmonary effort is normal.     Breath sounds: Normal breath sounds.  Abdominal:     General: Bowel sounds are normal. There is no distension.     Palpations: Abdomen is soft. There is no mass.     Tenderness: There is no abdominal tenderness.  Musculoskeletal:     Right lower leg: No edema.     Left lower leg: No edema.  Skin:    General: Skin is warm.  Neurological:     General: No focal deficit present.     Mental Status: She is alert and oriented to person, place, and time.     Cranial Nerves: Facial asymmetry present. No dysarthria.     Sensory: No sensory deficit.     Motor: Weakness present. No tremor.     Coordination: Coordination normal.  Finger-Nose-Finger Test and Heel to [facility] Test normal.     Gait: Gait normal.     Deep Tendon Reflexes:     Reflex Scores:      Bicep reflexes are 1+ on the right side and 1+ on the left side.      Patellar reflexes are 1+ on the right side and 1+ on the left side. Psychiatric:        Attention and Perception: Attention normal.        Mood and Affect: Mood normal.        Speech: Speech normal.        Behavior: Behavior normal. Behavior is cooperative.     Labs on Admission: I have personally reviewed following labs and imaging studies CBC: Recent Labs  Lab 09/14/23 0754 09/14/23 0812  WBC 7.2  --   NEUTROABS 4.3  --   HGB 15.1* 15.3*  HCT 45.8 45.0  MCV 87.4  --   PLT 265  --    Basic Metabolic Panel: Recent Labs  Lab 09/14/23 0754 09/14/23 0812  NA 140 142  K 3.6 3.7  CL 105 105  CO2 22  --   GLUCOSE 146* 147*  BUN 8 9  CREATININE 0.73 0.60  CALCIUM  9.1  --    GFR: Estimated Creatinine Clearance: 64.3 mL/min (by C-G formula based on SCr of 0.6  mg/dL). Liver Function Tests: Recent Labs  Lab 09/14/23 0754  AST 43*  ALT 39  ALKPHOS 84  BILITOT 0.5  PROT 7.6  ALBUMIN 4.1   No results for input(s): LIPASE, AMYLASE in the last 168 hours. No results for input(s): AMMONIA in the last 168 hours. Recent Labs    08/19/23 1138 09/14/23 0754 09/14/23 0812  BUN 10 8 9   CREATININE 0.57 0.73 0.60    Estimated Creatinine Clearance: 64.3 mL/min (by C-G formula based on SCr of 0.6 mg/dL).   Recent Labs    08/19/23 1138 09/14/23 0754 09/14/23 0812  BUN 10 8 9   CREATININE 0.57 0.73 0.60  CO2 29 22  --    Cardiac Enzymes: No results for input(s): CKTOTAL, CKMB, CKMBINDEX, TROPONINI in the last 168 hours. BNP (last 3 results) No results for input(s): PROBNP in the last 8760 hours. HbA1C: No results for input(s): HGBA1C in the last 72 hours. CBG: Recent Labs  Lab 09/14/23 0738  GLUCAP 144*   Lipid Profile: No results for input(s):  CHOL, HDL, LDLCALC, TRIG, CHOLHDL, LDLDIRECT in the last 72 hours. Thyroid  Function Tests: No results for input(s): TSH, T4TOTAL, FREET4, T3FREE, THYROIDAB in the last 72 hours. Anemia Panel: No results for input(s): VITAMINB12, FOLATE, FERRITIN, TIBC, IRON, RETICCTPCT in the last 72 hours. Urine analysis:    Component Value Date/Time   COLORURINE YELLOW 07/25/2018 1627   APPEARANCEUR HAZY (A) 07/25/2018 1627   LABSPEC 1.009 07/25/2018 1627   PHURINE 6.0 07/25/2018 1627   GLUCOSEU NEGATIVE 07/25/2018 1627   HGBUR NEGATIVE 07/25/2018 1627   BILIRUBINUR NEGATIVE 07/25/2018 1627   KETONESUR NEGATIVE 07/25/2018 1627   PROTEINUR NEGATIVE 07/25/2018 1627   NITRITE NEGATIVE 07/25/2018 1627   LEUKOCYTESUR NEGATIVE 07/25/2018 1627   Radiological Exams on Admission: MR BRAIN WO CONTRAST Result Date: 09/14/2023 EXAM: MRI BRAIN WITHOUT CONTRAST 09/14/2023 11:21:00 AM TECHNIQUE: Multiplanar multisequence MRI of the head/brain was performed without the administration of intravenous contrast. COMPARISON: 04/05/2017 CLINICAL HISTORY: Neuro deficit, acute, stroke suspected. Left lower facial droop. FINDINGS: BRAIN AND VENTRICLES: There is an acute lacunar infarct within the centrum semiovale of the right posterior frontal lobe. There is age-related atrophy and moderately advanced periventricular and deep cerebral white matter disease. There is a chronic lacunar infarct within the left posterior thalamus. No intracranial hemorrhage. No mass. No midline shift. No hydrocephalus. The sella is unremarkable. Normal flow voids. ORBITS: No acute abnormality. SINUSES AND MASTOIDS: There is polypoid mucosal disease present within the maxillary sinuses bilaterally. BONES AND SOFT TISSUES: Normal marrow signal. No acute soft tissue abnormality. IMPRESSION: 1. Acute lacunar infarct within the centrum semiovale of the right posterior frontal lobe. 2. Chronic lacunar infarct within the left  posterior thalamus. 3. Age-related atrophy and moderately advanced periventricular and deep cerebral white matter disease. Electronically signed by: Evalene Coho MD 09/14/2023 11:30 AM EDT RP Workstation: [id number]   Data Reviewed: Relevant notes from primary care and specialist visits, past discharge summaries as available in EHR, including Care Everywhere . Prior diagnostic testing as pertinent to current admission diagnoses, Updated medications and problem lists for reconciliation .ED course, including vitals, labs, imaging, treatment and response to treatment,Triage notes, nursing and pharmacy notes and ED provider's notes.Notable results as noted in HPI.Discussed case with EDMD/ ED APP/ or Specialty MD on call and as needed.  Assessment & Plan  >> Acute lacunar infarct of the right frontal lobe: - Admit to telemetry unit with cardiac monitoring. -A1c free T4 TSH fasting lipid panel. -Antiplatelet therapy  per neurology aspirin  and Plavix  with loading dose of 300 mg of Plavix . - Neurochecks aspiration precaution fall precaution ambulate with assistance. - Swallow evaluation and a formal swallow as needed. - 2D echocardiogram with bubble study to evaluate for PFO. - PT/OT consult. - Neurology / stroke team consulted.   >> Essential hypertension: Vitals:   09/14/23 0738 09/14/23 1120  BP: (!) 150/101 135/88  Goal systolic of 150s.   >> OSA on CPAP: Cpap per home settings.    >> Obesity with a BMI of 36.84/hyperglycemia/suspect diabetes mellitus type 2: Will follow A1c. Carb consistent cardiac diet.   DVT prophylaxis:  Heparin .  Consults:  Neurology.   Advance Care Planning:    Code Status: Full Code   Family Communication:  None.  Disposition Plan:  Home.  Severity of Illness: The appropriate patient status for this patient is OBSERVATION. Observation status is judged to be reasonable and necessary in order to provide the required intensity of service to ensure the  patient's safety. The patient's presenting symptoms, physical exam findings, and initial radiographic and laboratory data in the context of their medical condition is felt to place them at decreased risk for further clinical deterioration. Furthermore, it is anticipated that the patient will be medically stable for discharge from the hospital within 2 midnights of admission.   Unresulted Labs (From admission, onward)     Start     Ordered   09/14/23 1245  Lipid panel  Once,   URGENT        09/14/23 1244   09/14/23 1245  Hemoglobin A1c  Once,   URGENT        09/14/23 1244            Meds ordered this encounter  Medications   FOLLOWED BY Linked Order Group    aspirin  tablet 325 mg    aspirin  EC tablet 81 mg   FOLLOWED BY Linked Order Group    clopidogrel  (PLAVIX ) tablet 300 mg    clopidogrel  (PLAVIX ) tablet 75 mg    stroke: early stages of recovery book   0.9 %  sodium chloride  infusion   OR Linked Order Group    acetaminophen  (TYLENOL ) tablet 650 mg    acetaminophen  (TYLENOL ) 160 MG/5ML solution 650 mg    acetaminophen  (TYLENOL ) suppository 650 mg   heparin  injection 5,000 Units     Orders Placed This Encounter  Procedures   MR BRAIN WO CONTRAST   CT ANGIO HEAD NECK W WO CM   Ethanol   Protime-INR   APTT   CBC   Differential   Comprehensive metabolic panel   Urine rapid drug screen (hosp performed)   Lipid panel   Hemoglobin A1c   Diet heart healthy/carb modified Room service appropriate? Yes; Fluid consistency: Nectar Thick   Vital signs   ED Cardiac monitoring   NIH Stroke Scale   Swallow screen   Initiate Carrier Fluid Protocol   If O2 sat <94% Administer O2 @ 2 Liters/Minute If O2 Sat < 94%, administer O2 at 2 liters/minute via nasal cannula.   NIHSS score documentation NIHSS score range: 0-42   Notify physician (specify)   OOB with assistance   Activity as tolerated   Swallow screen - If patient does NOT pass this screen, place order for SLP eval and treat  (SLP2) - swallowing evaluation (BSE, MBS and/or diet order as indicated)   Intake and output   Apply Stroke Care Plan: Ischemic Stroke, TIA   Initiate Adult [employer]  Maintenance and Catheter Protocol for patients with central line (CVC, PICC, Port, Hemodialysis, Trialysis)   Discuss with patient and document patient's goals for stroke risk factor reduction   Initiate Oral Care Protocol   Provide stroke education material to patient and family.   Nurse to provide smoking / tobacco cessation education   If the patient has passed the Stroke Swallow Screen or has a feeding tube, then RN may order General Admission PRN Orders (through manage orders) for the following patient needs: allergy symptoms (Claritin), cold sores (Carmex), cough (Robitussin DM), eye irritation (Liquifilm Tears), hemorrhoids (Tucks), indigestion (Maalox), minor skin irritation (hydrocortisone  cream), muscle pain Lucienne Gay), nose irritation (saline nasal spray) and sore throat (Chloraseptic spray).   Vital signs   Full code   Consult to neurology   Consult to hospitalist   Consult to Registered Dietitian   Consult to Transition of Care Team   OT eval and treat   PT eval and treat   ED Pulse oximetry, continuous   Oxygen therapy Mode or (Route): Nasal cannula; Liters Per Minute: 2; Keep O2 saturation between: greater than 94 %   CPAP   SLP eval and treat Reason for evaluation: Cognitive/Language evaluation   CBG monitoring, ED   I-stat chem 8, ED   ED EKG   EKG 12-Lead   ECHOCARDIOGRAM COMPLETE BUBBLE STUDY   Saline lock IV   Place in observation (patient's expected length of stay will be less than 2 midnights)   Fall precautions   Aspiration precautions    Author: Mario LULLA Blanch, MD 12 pm- 8 pm. [facility]. 09/14/2023 1:38 PM Please note for any communication after hours contact [facility] Assigned provider on call on Amion.

## 2023-09-14 NOTE — Plan of Care (Signed)
 Problem: Education: Goal: Knowledge of disease or condition will improve 09/14/2023 2038 by Jennye Roberts CROME, RN Outcome: Progressing 09/14/2023 1943 by Jennye Roberts CROME, RN Outcome: Progressing Goal: Knowledge of secondary prevention will improve (MUST DOCUMENT ALL) 09/14/2023 2038 by Jennye Roberts CROME, RN Outcome: Progressing 09/14/2023 1943 by Jennye Roberts CROME, RN Outcome: Progressing Goal: Knowledge of patient specific risk factors will improve (DELETE if not current risk factor) 09/14/2023 2038 by Jennye Roberts CROME, RN Outcome: Progressing 09/14/2023 1943 by Jennye Roberts CROME, RN Outcome: Progressing   Problem: Ischemic Stroke/TIA Tissue Perfusion: Goal: Complications of ischemic stroke/TIA will be minimized 09/14/2023 2038 by Jennye Roberts CROME, RN Outcome: Progressing 09/14/2023 1943 by Jennye Roberts CROME, RN Outcome: Progressing   Problem: Coping: Goal: Will verbalize positive feelings about self 09/14/2023 2038 by Jennye Roberts CROME, RN Outcome: Progressing 09/14/2023 1943 by Jennye Roberts CROME, RN Outcome: Progressing Goal: Will identify appropriate support needs 09/14/2023 2038 by Jennye Roberts CROME, RN Outcome: Progressing 09/14/2023 1943 by Jennye Roberts CROME, RN Outcome: Progressing   Problem: Health Behavior/Discharge Planning: Goal: Ability to manage health-related needs will improve 09/14/2023 2038 by Jennye Roberts CROME, RN Outcome: Progressing 09/14/2023 1943 by Jennye Roberts CROME, RN Outcome: Progressing Goal: Goals will be collaboratively established with patient/family 09/14/2023 2038 by Jennye Roberts CROME, RN Outcome: Progressing 09/14/2023 1943 by Jennye Roberts CROME, RN Outcome: Progressing   Problem: Self-Care: Goal: Ability to participate in self-care as condition permits will improve 09/14/2023 2038 by Jennye Roberts CROME, RN Outcome: Progressing 09/14/2023 1943 by Jennye Roberts CROME, RN Outcome: Progressing Goal: Verbalization of feelings and  concerns over difficulty with self-care will improve 09/14/2023 2038 by Jennye Roberts CROME, RN Outcome: Progressing 09/14/2023 1943 by Jennye Roberts CROME, RN Outcome: Progressing Goal: Ability to communicate needs accurately will improve 09/14/2023 2038 by Jennye Roberts CROME, RN Outcome: Progressing 09/14/2023 1943 by Jennye Roberts CROME, RN Outcome: Progressing   Problem: Nutrition: Goal: Risk of aspiration will decrease 09/14/2023 2038 by Jennye Roberts CROME, RN Outcome: Progressing 09/14/2023 1943 by Jennye Roberts CROME, RN Outcome: Progressing Goal: Dietary intake will improve 09/14/2023 2038 by Jennye Roberts CROME, RN Outcome: Progressing 09/14/2023 1943 by Jennye Roberts CROME, RN Outcome: Progressing   Problem: Education: Goal: Knowledge of General Education information will improve Description: Including pain rating scale, medication(s)/side effects and non-pharmacologic comfort measures 09/14/2023 2038 by Jennye Roberts CROME, RN Outcome: Progressing 09/14/2023 1943 by Jennye Roberts CROME, RN Outcome: Progressing   Problem: Health Behavior/Discharge Planning: Goal: Ability to manage health-related needs will improve 09/14/2023 2038 by Jennye Roberts CROME, RN Outcome: Progressing 09/14/2023 1943 by Jennye Roberts CROME, RN Outcome: Progressing   Problem: Clinical Measurements: Goal: Ability to maintain clinical measurements within normal limits will improve 09/14/2023 2038 by Jennye Roberts CROME, RN Outcome: Progressing 09/14/2023 1943 by Jennye Roberts CROME, RN Outcome: Progressing Goal: Will remain free from infection 09/14/2023 2038 by Jennye Roberts CROME, RN Outcome: Progressing 09/14/2023 1943 by Jennye Roberts CROME, RN Outcome: Progressing Goal: Diagnostic test results will improve 09/14/2023 2038 by Jennye Roberts CROME, RN Outcome: Progressing 09/14/2023 1943 by Jennye Roberts CROME, RN Outcome: Progressing Goal: Respiratory complications will improve 09/14/2023 2038 by Jennye Roberts CROME, RN Outcome: Progressing 09/14/2023 1943 by Jennye Roberts CROME, RN Outcome: Progressing Goal: Cardiovascular complication will be avoided 09/14/2023 2038 by Jennye Roberts CROME, RN Outcome: Progressing 09/14/2023 1943 by Jennye Roberts CROME, RN Outcome: Progressing   Problem: Activity: Goal: Risk for activity intolerance will decrease 09/14/2023 2038 by Jennye Roberts CROME, RN Outcome: Progressing 09/14/2023 1943 by  Whaley-Dula, Kyleena Scheirer L, RN Outcome: Progressing   Problem: Nutrition: Goal: Adequate nutrition will be maintained 09/14/2023 2038 by Jennye Roberts CROME, RN Outcome: Progressing 09/14/2023 1943 by Jennye Roberts CROME, RN Outcome: Progressing   Problem: Coping: Goal: Level of anxiety will decrease 09/14/2023 2038 by Jennye Roberts CROME, RN Outcome: Progressing 09/14/2023 1943 by Jennye Roberts CROME, RN Outcome: Progressing   Problem: Elimination: Goal: Will not experience complications related to bowel motility 09/14/2023 2038 by Jennye Roberts CROME, RN Outcome: Progressing 09/14/2023 1943 by Jennye Roberts CROME, RN Outcome: Progressing Goal: Will not experience complications related to urinary retention 09/14/2023 2038 by Jennye Roberts CROME, RN Outcome: Progressing 09/14/2023 1943 by Jennye Roberts CROME, RN Outcome: Progressing   Problem: Pain Managment: Goal: General experience of comfort will improve and/or be controlled 09/14/2023 2038 by Jennye Roberts CROME, RN Outcome: Progressing 09/14/2023 1943 by Jennye Roberts CROME, RN Outcome: Progressing   Problem: Safety: Goal: Ability to remain free from injury will improve 09/14/2023 2038 by Jennye Roberts CROME, RN Outcome: Progressing 09/14/2023 1943 by Jennye Roberts CROME, RN Outcome: Progressing   Problem: Skin Integrity: Goal: Risk for impaired skin integrity will decrease 09/14/2023 2038 by Jennye Roberts CROME, RN Outcome: Progressing 09/14/2023 1943 by Jennye Roberts CROME, RN Outcome: Progressing

## 2023-09-14 NOTE — Plan of Care (Signed)
 Problem: Education: Goal: Knowledge of disease or condition will improve 09/14/2023 2314 by Jennye Roberts CROME, RN Outcome: Progressing 09/14/2023 2038 by Jennye Roberts CROME, RN Outcome: Progressing 09/14/2023 1943 by Jennye Roberts CROME, RN Outcome: Progressing Goal: Knowledge of secondary prevention will improve (MUST DOCUMENT ALL) 09/14/2023 2314 by Jennye Roberts CROME, RN Outcome: Progressing 09/14/2023 2038 by Jennye Roberts CROME, RN Outcome: Progressing 09/14/2023 1943 by Jennye Roberts CROME, RN Outcome: Progressing Goal: Knowledge of patient specific risk factors will improve (DELETE if not current risk factor) 09/14/2023 2314 by Jennye Roberts CROME, RN Outcome: Progressing 09/14/2023 2038 by Jennye Roberts CROME, RN Outcome: Progressing 09/14/2023 1943 by Jennye Roberts CROME, RN Outcome: Progressing   Problem: Ischemic Stroke/TIA Tissue Perfusion: Goal: Complications of ischemic stroke/TIA will be minimized 09/14/2023 2314 by Jennye Roberts CROME, RN Outcome: Progressing 09/14/2023 2038 by Jennye Roberts CROME, RN Outcome: Progressing 09/14/2023 1943 by Jennye Roberts CROME, RN Outcome: Progressing   Problem: Coping: Goal: Will verbalize positive feelings about self 09/14/2023 2314 by Jennye Roberts CROME, RN Outcome: Progressing 09/14/2023 2038 by Jennye Roberts CROME, RN Outcome: Progressing 09/14/2023 1943 by Jennye Roberts CROME, RN Outcome: Progressing Goal: Will identify appropriate support needs 09/14/2023 2314 by Jennye Roberts CROME, RN Outcome: Progressing 09/14/2023 2038 by Jennye Roberts CROME, RN Outcome: Progressing 09/14/2023 1943 by Jennye Roberts CROME, RN Outcome: Progressing   Problem: Health Behavior/Discharge Planning: Goal: Ability to manage health-related needs will improve 09/14/2023 2314 by Jennye Roberts CROME, RN Outcome: Progressing 09/14/2023 2038 by Jennye Roberts CROME, RN Outcome: Progressing 09/14/2023 1943 by Jennye Roberts CROME, RN Outcome: Progressing Goal: Goals  will be collaboratively established with patient/family 09/14/2023 2314 by Jennye Roberts CROME, RN Outcome: Progressing 09/14/2023 2038 by Jennye Roberts CROME, RN Outcome: Progressing 09/14/2023 1943 by Jennye Roberts CROME, RN Outcome: Progressing   Problem: Self-Care: Goal: Ability to participate in self-care as condition permits will improve 09/14/2023 2314 by Jennye Roberts CROME, RN Outcome: Progressing 09/14/2023 2038 by Jennye Roberts CROME, RN Outcome: Progressing 09/14/2023 1943 by Jennye Roberts CROME, RN Outcome: Progressing Goal: Verbalization of feelings and concerns over difficulty with self-care will improve 09/14/2023 2314 by Jennye Roberts CROME, RN Outcome: Progressing 09/14/2023 2038 by Jennye Roberts CROME, RN Outcome: Progressing 09/14/2023 1943 by Jennye Roberts CROME, RN Outcome: Progressing Goal: Ability to communicate needs accurately will improve 09/14/2023 2314 by Jennye Roberts CROME, RN Outcome: Progressing 09/14/2023 2038 by Jennye Roberts CROME, RN Outcome: Progressing 09/14/2023 1943 by Jennye Roberts CROME, RN Outcome: Progressing   Problem: Nutrition: Goal: Risk of aspiration will decrease 09/14/2023 2314 by Jennye Roberts CROME, RN Outcome: Progressing 09/14/2023 2038 by Jennye Roberts CROME, RN Outcome: Progressing 09/14/2023 1943 by Jennye Roberts CROME, RN Outcome: Progressing Goal: Dietary intake will improve 09/14/2023 2314 by Jennye Roberts CROME, RN Outcome: Progressing 09/14/2023 2038 by Jennye Roberts CROME, RN Outcome: Progressing 09/14/2023 1943 by Jennye Roberts CROME, RN Outcome: Progressing   Problem: Education: Goal: Knowledge of General Education information will improve Description: Including pain rating scale, medication(s)/side effects and non-pharmacologic comfort measures 09/14/2023 2314 by Jennye Roberts CROME, RN Outcome: Progressing 09/14/2023 2038 by Jennye Roberts CROME, RN Outcome: Progressing 09/14/2023 1943 by Jennye Roberts CROME, RN Outcome: Progressing    Problem: Health Behavior/Discharge Planning: Goal: Ability to manage health-related needs will improve 09/14/2023 2314 by Jennye Roberts CROME, RN Outcome: Progressing 09/14/2023 2038 by Jennye Roberts CROME, RN Outcome: Progressing 09/14/2023 1943 by Jennye Roberts CROME, RN Outcome: Progressing   Problem: Clinical Measurements: Goal: Ability to maintain clinical measurements within normal limits will improve 09/14/2023  2314 by Jennye Roberts CROME, RN Outcome: Progressing 09/14/2023 2038 by Jennye Roberts CROME, RN Outcome: Progressing 09/14/2023 1943 by Jennye Roberts CROME, RN Outcome: Progressing Goal: Will remain free from infection 09/14/2023 2314 by Jennye Roberts CROME, RN Outcome: Progressing 09/14/2023 2038 by Jennye Roberts CROME, RN Outcome: Progressing 09/14/2023 1943 by Jennye Roberts CROME, RN Outcome: Progressing Goal: Diagnostic test results will improve 09/14/2023 2314 by Jennye Roberts CROME, RN Outcome: Progressing 09/14/2023 2038 by Jennye Roberts CROME, RN Outcome: Progressing 09/14/2023 1943 by Jennye Roberts CROME, RN Outcome: Progressing Goal: Respiratory complications will improve 09/14/2023 2314 by Jennye Roberts CROME, RN Outcome: Progressing 09/14/2023 2038 by Jennye Roberts CROME, RN Outcome: Progressing 09/14/2023 1943 by Jennye Roberts CROME, RN Outcome: Progressing Goal: Cardiovascular complication will be avoided 09/14/2023 2314 by Jennye Roberts CROME, RN Outcome: Progressing 09/14/2023 2038 by Jennye Roberts CROME, RN Outcome: Progressing 09/14/2023 1943 by Jennye Roberts CROME, RN Outcome: Progressing   Problem: Activity: Goal: Risk for activity intolerance will decrease 09/14/2023 2314 by Jennye Roberts CROME, RN Outcome: Progressing 09/14/2023 2038 by Jennye Roberts CROME, RN Outcome: Progressing 09/14/2023 1943 by Jennye Roberts CROME, RN Outcome: Progressing   Problem: Nutrition: Goal: Adequate nutrition will be maintained 09/14/2023 2314 by Jennye Roberts CROME, RN Outcome:  Progressing 09/14/2023 2038 by Jennye Roberts CROME, RN Outcome: Progressing 09/14/2023 1943 by Jennye Roberts CROME, RN Outcome: Progressing   Problem: Coping: Goal: Level of anxiety will decrease 09/14/2023 2314 by Jennye Roberts CROME, RN Outcome: Progressing 09/14/2023 2038 by Jennye Roberts CROME, RN Outcome: Progressing 09/14/2023 1943 by Jennye Roberts CROME, RN Outcome: Progressing   Problem: Elimination: Goal: Will not experience complications related to bowel motility 09/14/2023 2314 by Jennye Roberts CROME, RN Outcome: Progressing 09/14/2023 2038 by Jennye Roberts CROME, RN Outcome: Progressing 09/14/2023 1943 by Jennye Roberts CROME, RN Outcome: Progressing Goal: Will not experience complications related to urinary retention 09/14/2023 2314 by Jennye Roberts CROME, RN Outcome: Progressing 09/14/2023 2038 by Jennye Roberts CROME, RN Outcome: Progressing 09/14/2023 1943 by Jennye Roberts CROME, RN Outcome: Progressing   Problem: Pain Managment: Goal: General experience of comfort will improve and/or be controlled 09/14/2023 2314 by Jennye Roberts CROME, RN Outcome: Progressing 09/14/2023 2038 by Jennye Roberts CROME, RN Outcome: Progressing 09/14/2023 1943 by Jennye Roberts CROME, RN Outcome: Progressing   Problem: Safety: Goal: Ability to remain free from injury will improve 09/14/2023 2314 by Jennye Roberts CROME, RN Outcome: Progressing 09/14/2023 2038 by Jennye Roberts CROME, RN Outcome: Progressing 09/14/2023 1943 by Jennye Roberts CROME, RN Outcome: Progressing   Problem: Skin Integrity: Goal: Risk for impaired skin integrity will decrease 09/14/2023 2314 by Jennye Roberts CROME, RN Outcome: Progressing 09/14/2023 2038 by Jennye Roberts CROME, RN Outcome: Progressing 09/14/2023 1943 by Jennye Roberts CROME, RN Outcome: Progressing

## 2023-09-14 NOTE — Plan of Care (Signed)

## 2023-09-14 NOTE — Progress Notes (Signed)
 [facility] night cross cover note:   Per pt's request, I had resumed her home nocturnal CPAP in the setting of a history of obstructive sleep apnea.  Additionally, the patient requests medication for anxiety as well as for sleep.  I subsequently ordered Valium  5 mg IV x 1 dose now and also placed order for prn melatonin for insomnia.  Additionally, the patient requests resumption of her home medication.  I subsequently reviewed her reconciled outpatient medication list, and resumed her home Singulair  and prn albuterol  inhaler.  I also ordered our formulary equivalent of her home Trelegy. Will hold home amlodipine  for now in the setting of her presenting acute ischemic CVA with last known well documented to be midnight on 9/6.   RN also conveys that the patient consumes alcohol on daily basis. In light of this, I initiated CIWA protocol. I also ordered qdaily supplementation with multivitamin, folic acid , and thiamine , with first doses to occur now, and also placed consult with transition of care team.     Eva Pore, DO Hospitalist

## 2023-09-14 NOTE — ED Provider Notes (Signed)
 [place] [facility] DEPARTMENT AT [facility] Provider Note   CSN: [id number] Arrival date & time: 09/14/23  0730     Patient presents with: Facial Droop   Toni Hicks is a 73 y.o. female.   HPI Patient presents with facial droop.  Left-sided.  States went to bed last night at around midnight and was not there.  Has had previous stroke with right sided deficits.  Will get tingling in the arms and legs.  States that wax and wanes but potentially is a little more severe than before.  States she is due for an MRI this week to follow her previous stroke.  Past Medical History:  Diagnosis Date   ADD (attention deficit disorder)    Adult acne    Alcohol abuse    Allergic rhinitis    Allergy From birth   Anxiety and depression    Arthritis    Asthma    Congenital pneumonia    COPD (chronic obstructive pulmonary disease) (HCC)    Eczema    Hypertension    Hypothyroidism    Impaired fasting blood sugar    Mycoplasma pneumonia    Restless leg syndrome    Seasonal allergies    Sleep apnea    Stroke (HCC)    Substance abuse (HCC)    Urticaria    Past Surgical History:  Procedure Laterality Date   DILATION AND CURETTAGE OF UTERUS     @ 73 years old   TOTAL HIP ARTHROPLASTY Left 07/29/2018   Procedure: Left Anterior Hip Arthroplasty;  Surgeon: Sheril Coy, MD;  Location: [facility];  Service: Orthopedics;  Laterality: Left;   WISDOM TOOTH EXTRACTION        Prior to Admission medications   Medication Sig Start Date End Date Taking? Authorizing Provider  albuterol  (VENTOLIN  HFA) 108 (90 Base) MCG/ACT inhaler INHALE 1-2 PUFFS BY MOUTH EVERY 6 HOURS AS NEEDED FOR WHEEZE OR SHORTNESS OF BREATH 03/11/23   Mannam, Praveen, MD  amLODipine  (NORVASC ) 5 MG tablet TAKE 1 TABLET BY MOUTH EVERY DAY 08/13/23   Rollene Almarie LABOR, MD  b complex vitamins tablet Take 1 tablet by mouth 3 (three) times a week.    [provider]  DULoxetine  (CYMBALTA ) 20 MG capsule TAKE 1  CAPSULE BY MOUTH EVERY DAY 06/10/23   Claudene Arthea HERO, DO  DULoxetine  (CYMBALTA ) 30 MG capsule Take 1 capsule (30 mg total) by mouth daily. 09/11/23   Claudene Arthea HERO, DO  Dupilumab  (DUPIXENT ) 300 MG/2ML SOAJ Inject 300 mg into the skin every 14 (fourteen) days. 04/24/23   Neysa Rama D, MD  EPINEPHrine  0.3 mg/0.3 mL IJ SOAJ injection Inject 0.3 mg into the muscle as needed for anaphylaxis. 04/27/20   Neysa Rama D, MD  hydrocortisone  2.5 % ointment Apply topically 2 (two) times daily. 02/12/20   Jeneal Danita Macintosh, MD  meloxicam  (MOBIC ) 15 MG tablet Take 1 tablet (15 mg total) by mouth daily. 09/11/23   Claudene Arthea HERO, DO  meloxicam  (MOBIC ) 7.5 MG tablet TAKE 1 TABLET BY MOUTH EVERY DAY 08/12/23   Smith, Zachary M, DO  montelukast  (SINGULAIR ) 10 MG tablet TAKE 1 TABLET BY MOUTH EVERYDAY AT BEDTIME 07/07/23   Young, Rama D, MD  tirzepatide  (ZEPBOUND ) 2.5 MG/0.5ML Pen Inject 2.5 mg into the skin once a week. 08/19/23   Rollene Almarie LABOR, MD  tirzepatide  (ZEPBOUND ) 5 MG/0.5ML Pen INJECT 5 MG SUBCUTANEOUSLY WEEKLY 08/19/23   Rollene Almarie LABOR, MD  tirzepatide  (ZEPBOUND ) 7.5 MG/0.5ML Pen Inject  7.5 mg into the skin once a week. 08/19/23   Rollene Almarie LABOR, MD  TRELEGY ELLIPTA  100-62.5-25 MCG/ACT AEPB INHALE 1 PUFF THEN RINSE MOUTH, ONCE DAILY 09/05/23   Neysa Rama D, MD  Vitamin D , Ergocalciferol , (DRISDOL ) 1.25 MG (50000 UNIT) CAPS capsule Take 1 capsule (50,000 Units total) by mouth every 7 (seven) days. 09/11/23   Claudene Arthea HERO, DO    Allergies: Astelin  [azelastine ], Nasonex  [mometasone ], and Other    Review of Systems  Updated Vital Signs BP 135/88 (BP Location: Right Arm)   Pulse 65   Temp 97.6 F (36.4 C) (Oral)   Resp 17   Ht 5' 1 (1.549 m)   Wt 88.5 kg   SpO2 100%   BMI 36.84 kg/m   Physical Exam Vitals and nursing note reviewed.  HENT:     Head: Atraumatic.  Eyes:     Extraocular Movements: Extraocular movements intact.     Pupils: Pupils are equal,  round, and reactive to light.  Cardiovascular:     Rate and Rhythm: Regular rhythm.  Chest:     Chest wall: No tenderness.  Abdominal:     Tenderness: There is no abdominal tenderness.  Skin:    General: Skin is warm.  Neurological:     Mental Status: She is alert and oriented to person, place, and time.     Comments: Left lower facial droop.  Sensation grossly intact.  However does have good eyebrow raising a good eyelid closing bilaterally.  Sensation intact in upper and lower extremities.  Good grip strength bilateral upper and lower extremities.  Good straight leg raise bilaterally.     (all labs ordered are listed, but only abnormal results are displayed) Labs Reviewed  APTT - Abnormal; Notable for the following components:      Result Value   aPTT 37 (*)    All other components within normal limits  CBC - Abnormal; Notable for the following components:   RBC 5.24 (*)    Hemoglobin 15.1 (*)    All other components within normal limits  COMPREHENSIVE METABOLIC PANEL WITH GFR - Abnormal; Notable for the following components:   Glucose, Bld 146 (*)    AST 43 (*)    All other components within normal limits  RAPID URINE DRUG SCREEN, HOSP PERFORMED - Abnormal; Notable for the following components:   Tetrahydrocannabinol POSITIVE (*)    All other components within normal limits  CBG MONITORING, ED - Abnormal; Notable for the following components:   Glucose-Capillary 144 (*)    All other components within normal limits  I-STAT CHEM 8, ED - Abnormal; Notable for the following components:   Glucose, Bld 147 (*)    Calcium , Ion 1.09 (*)    Hemoglobin 15.3 (*)    All other components within normal limits  ETHANOL  PROTIME-INR  DIFFERENTIAL    EKG: EKG Interpretation Date/Time:  Saturday September 14 2023 08:14:04 EDT Ventricular Rate:  65 PR Interval:  161 QRS Duration:  97 QT Interval:  457 QTC Calculation: 476 R Axis:   -19  Text Interpretation: Sinus or ectopic  atrial rhythm Borderline left axis deviation Abnormal R-wave progression, early transition Confirmed by Patsey Lot [phone]) on 09/14/2023 11:07:10 AM  Radiology: MR BRAIN WO CONTRAST Result Date: 09/14/2023 EXAM: MRI BRAIN WITHOUT CONTRAST 09/14/2023 11:21:00 AM TECHNIQUE: Multiplanar multisequence MRI of the head/brain was performed without the administration of intravenous contrast. COMPARISON: 04/05/2017 CLINICAL HISTORY: Neuro deficit, acute, stroke suspected. Left lower facial droop. FINDINGS: BRAIN  AND VENTRICLES: There is an acute lacunar infarct within the centrum semiovale of the right posterior frontal lobe. There is age-related atrophy and moderately advanced periventricular and deep cerebral white matter disease. There is a chronic lacunar infarct within the left posterior thalamus. No intracranial hemorrhage. No mass. No midline shift. No hydrocephalus. The sella is unremarkable. Normal flow voids. ORBITS: No acute abnormality. SINUSES AND MASTOIDS: There is polypoid mucosal disease present within the maxillary sinuses bilaterally. BONES AND SOFT TISSUES: Normal marrow signal. No acute soft tissue abnormality. IMPRESSION: 1. Acute lacunar infarct within the centrum semiovale of the right posterior frontal lobe. 2. Chronic lacunar infarct within the left posterior thalamus. 3. Age-related atrophy and moderately advanced periventricular and deep cerebral white matter disease. Electronically signed by: Evalene Coho MD 09/14/2023 11:30 AM EDT RP Workstation: [id number]     Procedures   Medications Ordered in the ED - No data to display                                  Medical Decision Making Amount and/or Complexity of Data Reviewed Labs: ordered. Radiology: ordered.   Patient with left lower facial droop.  Last normal around midnight last night.  Does not appear to have forehead involvement.  Differential diagnosis does include somewhat atypical Bell's palsy and  stroke.  Reviewed previous neurology note.  Will get MRI after discussion with Dr.Palikh  Not a TNK candidate due to last normal and relatively mild deficits.  MRI does show acute stroke.  Will inform neurology admit to hospitalist        Final diagnoses:  Cerebrovascular accident (CVA), unspecified mechanism [facility])    ED Discharge Orders     None          Patsey Lot, MD 09/14/23 1138

## 2023-09-14 NOTE — Consult Note (Signed)
 NEUROLOGY CONSULT NOTE   Date of service: September 14, 2023 Patient Name: Toni Hicks MRN:  [id number] DOB:  06/08/1950 Chief Complaint: left facial weakness Requesting Provider: Patsey Lot, MD  History of Present Illness  ORA BOLLIG is a 73 y.o. female with hx of left posterior thalamic CVA in 2016 with residual fluctuating right arm and face numbness (usually just the 3-5 digits on the right hand, the right nare, and corner of right mouth but at times involving the right forearm), EtOH use (improved from 1.5 bottles of wine per day to 2 glasses of wine daily per daughter), OSA on CPAP, HTN, anxiety, depression, RLS, remote tobacco use, and substance abuse who presented to the ED this morning for evaluation of left facial weakness.  Patient states that she was in her usual state of health last night at midnight when she went to bed and woke up at about 615 this morning with left-sided facial weakness and slurred speech.    NIHSS components Score: Comment  1a Level of Conscious 0[x]  1[]  2[]  3[]      1b LOC Questions 0[x]  1[]  2[]       1c LOC Commands 0[x]  1[]  2[]       2 Best Gaze 0[x]  1[]  2[]       3 Visual 0[x]  1[]  2[]  3[]      4 Facial Palsy 0[]  1[x]  2[]  3[]      5a Motor Arm - left 0[x]  1[]  2[]  3[]  4[]  UN[]    5b Motor Arm - Right 0[x]  1[]  2[]  3[]  4[]  UN[]    6a Motor Leg - Left 0[x]  1[]  2[]  3[]  4[]  UN[]    6b Motor Leg - Right 0[x]  1[]  2[]  3[]  4[]  UN[]    7 Limb Ataxia 0[x]  1[]  2[]  UN[]      8 Sensory 0[]  1[x]  2[]  UN[]      9 Best Language 0[x]  1[]  2[]  3[]      10 Dysarthria 0[]  1[x]  2[]  UN[]      11 Extinct. and Inattention 0[x]  1[]  2[]       TOTAL: 3     ROS  Comprehensive ROS performed and pertinent positives documented in HPI   Past History   Past Medical History:  Diagnosis Date   ADD (attention deficit disorder)    Adult acne    Alcohol abuse    Allergic rhinitis    Allergy From birth   Anxiety and depression    Arthritis    Asthma    Congenital pneumonia     COPD (chronic obstructive pulmonary disease) (HCC)    Eczema    Hypertension    Hypothyroidism    Impaired fasting blood sugar    Mycoplasma pneumonia    Restless leg syndrome    Seasonal allergies    Sleep apnea    Stroke (HCC)    Substance abuse (HCC)    Urticaria    Past Surgical History:  Procedure Laterality Date   DILATION AND CURETTAGE OF UTERUS     @ 73 years old   TOTAL HIP ARTHROPLASTY Left 07/29/2018   Procedure: Left Anterior Hip Arthroplasty;  Surgeon: Sheril Coy, MD;  Location: [facility];  Service: Orthopedics;  Laterality: Left;   WISDOM TOOTH EXTRACTION     Family History: Family History  Problem Relation Age of Onset   Cancer Mother        breast   Hypertension Mother    Dementia Mother    Stroke Mother    Breast cancer Mother    COPD Father    Heart  failure Father    Coronary artery disease Father    Colon cancer Neg Hx    Rectal cancer Neg Hx    Stomach cancer Neg Hx    Esophageal cancer Neg Hx    Social History  reports that she quit smoking about 45 years ago. Her smoking use included cigarettes. She started smoking about 60 years ago. She has a 30 pack-year smoking history. She has been exposed to tobacco smoke. She has never used smokeless tobacco. She reports current alcohol use of about 1.0 - 2.0 standard drink of alcohol per week. She reports that she does not use drugs.  Allergies  Allergen Reactions   Astelin  [Azelastine ] Other (See Comments)    Per patient caused psychiatric illness   Nasonex  [Mometasone ] Other (See Comments)    Per patient caused psychiatric illness   Other     Can not eat carbohydrates without protein, is allergic to certain foods but can take in certain doses   Medications  No current facility-administered medications for this encounter.  Current Outpatient Medications:    albuterol  (VENTOLIN  HFA) 108 (90 Base) MCG/ACT inhaler, INHALE 1-2 PUFFS BY MOUTH EVERY 6 HOURS AS NEEDED FOR WHEEZE OR SHORTNESS OF BREATH,  Disp: 36 each, Rfl: 3   amLODipine  (NORVASC ) 5 MG tablet, TAKE 1 TABLET BY MOUTH EVERY DAY, Disp: 90 tablet, Rfl: 0   b complex vitamins tablet, Take 1 tablet by mouth 3 (three) times a week., Disp: , Rfl:    DULoxetine  (CYMBALTA ) 20 MG capsule, TAKE 1 CAPSULE BY MOUTH EVERY DAY, Disp: 90 capsule, Rfl: 0   DULoxetine  (CYMBALTA ) 30 MG capsule, Take 1 capsule (30 mg total) by mouth daily., Disp: 30 capsule, Rfl: 1   Dupilumab  (DUPIXENT ) 300 MG/2ML SOAJ, Inject 300 mg into the skin every 14 (fourteen) days., Disp: 12 mL, Rfl: 1   EPINEPHrine  0.3 mg/0.3 mL IJ SOAJ injection, Inject 0.3 mg into the muscle as needed for anaphylaxis., Disp: 1 each, Rfl: 2   hydrocortisone  2.5 % ointment, Apply topically 2 (two) times daily., Disp: 60 g, Rfl: 5   meloxicam  (MOBIC ) 15 MG tablet, Take 1 tablet (15 mg total) by mouth daily., Disp: 30 tablet, Rfl: 1   meloxicam  (MOBIC ) 7.5 MG tablet, TAKE 1 TABLET BY MOUTH EVERY DAY, Disp: 30 tablet, Rfl: 0   montelukast  (SINGULAIR ) 10 MG tablet, TAKE 1 TABLET BY MOUTH EVERYDAY AT BEDTIME, Disp: 90 tablet, Rfl: 3   tirzepatide  (ZEPBOUND ) 2.5 MG/0.5ML Pen, Inject 2.5 mg into the skin once a week., Disp: 2 mL, Rfl: 0   tirzepatide  (ZEPBOUND ) 5 MG/0.5ML Pen, INJECT 5 MG SUBCUTANEOUSLY WEEKLY, Disp: 2 mL, Rfl: 0   tirzepatide  (ZEPBOUND ) 7.5 MG/0.5ML Pen, Inject 7.5 mg into the skin once a week., Disp: 2 mL, Rfl: 0   TRELEGY ELLIPTA  100-62.5-25 MCG/ACT AEPB, INHALE 1 PUFF THEN RINSE MOUTH, ONCE DAILY, Disp: 180 each, Rfl: 0   Vitamin D , Ergocalciferol , (DRISDOL ) 1.25 MG (50000 UNIT) CAPS capsule, Take 1 capsule (50,000 Units total) by mouth every 7 (seven) days., Disp: 12 capsule, Rfl: 0  Vitals   Vitals:   09/14/23 0738 09/14/23 0741 09/14/23 1120 09/14/23 1123  BP: (!) 150/101  135/88   Pulse: 74  65   Resp: 18  17   Temp: (!) 97.3 F (36.3 C)   97.6 F (36.4 C)  TempSrc:    Oral  SpO2: 97%  100%   Weight:  88.5 kg    Height:  5' 1 (1.549 m)  Body mass index is  36.84 kg/m.  Physical Exam   Constitutional: Appears well-developed and well-nourished.  Psych: Affect appropriate to situation.  She is calm and cooperative with exam. Eyes: No scleral injection.  HENT: No OP obstruction.  Head: Normocephalic.  Cardiovascular: Normal rate and regular rhythm.  Respiratory: Effort normal, non-labored breathing on room air GI: Soft.  No distension. There is no tenderness.  Skin: WDI.   Neurologic Examination  Mental Status: Patient is awake, alert, oriented to person, place, month, year, and situation. Patient is able to give a clear and coherent history. No signs of aphasia or neglect. She does have dysarthric speech in the setting of facial weakness. Cranial Nerves: II: Visual Fields are full. Pupils are equal, round, and reactive to light.   III,IV, VI: EOMI without ptosis or diploplia.  V: Chronic right facial numbness noted VII: Left mouth droop  VIII: Hearing is intact to voice X: Palate elevates symmetrically XI: Shoulder shrug is symmetric. XII: Tongue protrudes midline  Motor: Tone is normal. Bulk is normal. 5/5 strength was present in all four extremities.  Sensory: Sensation is symmetric to light touch n the arms and legs. No extinction to DSS present.  Patient reports some decrease sensation to the right hand digits with 3-5 that is chronic from her previous stroke. Plantars: Toes are downgoing bilaterally.  Cerebellar: FNF and HKS are intact bilaterally  Labs/Imaging/Neurodiagnostic studies   CBC:  Recent Labs  Lab 09-17-23 0754 09-17-23 0812  WBC 7.2  --   NEUTROABS 4.3  --   HGB 15.1* 15.3*  HCT 45.8 45.0  MCV 87.4  --   PLT 265  --    Basic Metabolic Panel:  Lab Results  Component Value Date   NA 142 09-17-23   K 3.7 09/17/2023   CO2 22 09/17/2023   GLUCOSE 147 (H) 09/17/23   BUN 9 09/17/2023   CREATININE 0.60 Sep 17, 2023   CALCIUM  9.1 09-17-2023   GFRNONAA >60 09-17-2023   GFRAA >60 07/25/2018    Lipid Panel:  Lab Results  Component Value Date   LDLCALC 76 08/19/2023   HgbA1c:  Lab Results  Component Value Date   HGBA1C 6.7 (H) 08/19/2023   Urine Drug Screen:     Component Value Date/Time   LABOPIA NONE DETECTED 2023/09/17 0754   COCAINSCRNUR NONE DETECTED 2023-09-17 0754   LABBENZ NONE DETECTED 2023/09/17 0754   AMPHETMU NONE DETECTED 09/17/2023 0754   THCU POSITIVE (A) 2023-09-17 0754   LABBARB NONE DETECTED 2023-09-17 0754    Alcohol Level     Component Value Date/Time   ETH <15 17-Sep-2023 0754   INR  Lab Results  Component Value Date   INR 0.9 17-Sep-2023   APTT  Lab Results  Component Value Date   APTT 37 (H) Sep 17, 2023   CT angio Head and Neck with contrast ordered, pending  MRI Brain (Personally reviewed): 1. Acute lacunar infarct within the centrum semiovale of the right posterior frontal lobe. 2. Chronic lacunar infarct within the left posterior thalamus. 3. Age-related atrophy and moderately advanced periventricular and deep cerebral white matter disease.  ASSESSMENT   AZURA TUFARO is a 73 y.o. female with PMHx of left thalamic CVA in 2016 with residual fluctuating right hand/arm and face numbness, EtOH use, remote tobacco use, OSA on CPAP, HTN, anxiety, depression, RLS, and substance abuse who presented to the ED 9/6 for evaluation of left mouth weakness and subsequent dysarthria present when she woke up this morning.  She was  last in her usual state of health before bed at 00:00 last night and woke up at 6:15 this morning with symptoms.  MRI brain on arrival revealed an acute lacunar infarct within the centrum semiovale of the right posterior frontal lobe and patient was admitted for stroke work up.    RECOMMENDATIONS  - HgbA1c, fasting lipid panel - CTA head and neck  - Frequent neuro checks - Echocardiogram - Prophylactic therapy- Antiplatelet med: Aspirin  - dose 325mg  PO or 300mg  PR with clopidogrel  300 mg PO once followed by ASA 81 mg PO  daily and clopidogrel  75 mg PO daily  - Risk factor modification - Telemetry monitoring - PT consult, OT consult, Speech consult - Stroke team to follow ______________________________________________________________________  Signed,  Mimi LELON Ny, NP [facility]  ATTENDING ATTESTATION:  Facial droop and dysarthria since yesterday.  MRI today revealed acute lacunar infarct.  Stroke workup as above loaded with aspirin  and Plavix .  Dr. Nichola evaluated pt independently, reviewed imaging, chart, labs. Discussed and formulated plan with the Resident/APP. Changes were made to the note where appropriate. Please see APP/resident note above for details.    Mechel Haggard,MD

## 2023-09-15 ENCOUNTER — Observation Stay (HOSPITAL_BASED_OUTPATIENT_CLINIC_OR_DEPARTMENT_OTHER)

## 2023-09-15 ENCOUNTER — Other Ambulatory Visit (HOSPITAL_COMMUNITY): Payer: Self-pay

## 2023-09-15 DIAGNOSIS — R29701 NIHSS score 1: Secondary | ICD-10-CM

## 2023-09-15 DIAGNOSIS — F1090 Alcohol use, unspecified, uncomplicated: Secondary | ICD-10-CM | POA: Diagnosis not present

## 2023-09-15 DIAGNOSIS — I69391 Dysphagia following cerebral infarction: Secondary | ICD-10-CM

## 2023-09-15 DIAGNOSIS — I6381 Other cerebral infarction due to occlusion or stenosis of small artery: Secondary | ICD-10-CM | POA: Diagnosis not present

## 2023-09-15 DIAGNOSIS — Z87891 Personal history of nicotine dependence: Secondary | ICD-10-CM

## 2023-09-15 DIAGNOSIS — R29810 Facial weakness: Secondary | ICD-10-CM | POA: Diagnosis not present

## 2023-09-15 DIAGNOSIS — E1151 Type 2 diabetes mellitus with diabetic peripheral angiopathy without gangrene: Secondary | ICD-10-CM | POA: Diagnosis not present

## 2023-09-15 DIAGNOSIS — E785 Hyperlipidemia, unspecified: Secondary | ICD-10-CM | POA: Diagnosis not present

## 2023-09-15 DIAGNOSIS — I639 Cerebral infarction, unspecified: Secondary | ICD-10-CM | POA: Diagnosis not present

## 2023-09-15 DIAGNOSIS — I69398 Other sequelae of cerebral infarction: Secondary | ICD-10-CM | POA: Diagnosis not present

## 2023-09-15 DIAGNOSIS — I6389 Other cerebral infarction: Secondary | ICD-10-CM

## 2023-09-15 DIAGNOSIS — R200 Anesthesia of skin: Secondary | ICD-10-CM | POA: Diagnosis not present

## 2023-09-15 LAB — ECHOCARDIOGRAM COMPLETE BUBBLE STUDY: S' Lateral: 3.2 cm

## 2023-09-15 MED ORDER — ROSUVASTATIN CALCIUM 10 MG PO TABS
10.0000 mg | ORAL_TABLET | Freq: Every day | ORAL | 0 refills | Status: DC
Start: 2023-09-15 — End: 2023-09-24
  Filled 2023-09-15: qty 30, 30d supply, fill #0

## 2023-09-15 MED ORDER — CLOPIDOGREL BISULFATE 75 MG PO TABS
75.0000 mg | ORAL_TABLET | Freq: Every day | ORAL | 0 refills | Status: AC
  Filled 2023-09-15: qty 20, 20d supply, fill #0

## 2023-09-15 MED ORDER — ROSUVASTATIN CALCIUM 5 MG PO TABS
10.0000 mg | ORAL_TABLET | Freq: Every day | ORAL | Status: DC
  Administered 2023-09-15: 10 mg via ORAL
  Filled 2023-09-15: qty 2

## 2023-09-15 MED ORDER — ENSURE PLUS HIGH PROTEIN PO LIQD
237.0000 mL | Freq: Two times a day (BID) | ORAL | Status: DC
  Administered 2023-09-15: 237 mL via ORAL

## 2023-09-15 MED ORDER — MELATONIN 5 MG PO TABS
5.0000 mg | ORAL_TABLET | Freq: Every day | ORAL | Status: DC
  Administered 2023-09-15: 5 mg via ORAL
  Filled 2023-09-15: qty 1

## 2023-09-15 MED ORDER — ASPIRIN 81 MG PO TBEC
81.0000 mg | DELAYED_RELEASE_TABLET | Freq: Every day | ORAL | 2 refills | Status: DC
Start: 2023-09-15 — End: 2023-09-24
  Filled 2023-09-15: qty 30, 30d supply, fill #0

## 2023-09-15 MED ORDER — TRAZODONE HCL 50 MG PO TABS
100.0000 mg | ORAL_TABLET | Freq: Every day | ORAL | Status: DC
  Administered 2023-09-15: 100 mg via ORAL
  Filled 2023-09-15: qty 2

## 2023-09-15 MED ORDER — ROSUVASTATIN CALCIUM 20 MG PO TABS
20.0000 mg | ORAL_TABLET | Freq: Every day | ORAL | Status: DC
  Administered 2023-09-16: 20 mg via ORAL
  Filled 2023-09-15: qty 1

## 2023-09-15 NOTE — Discharge Instructions (Signed)
 Follow with Primary MD Rollene Almarie LABOR, MD in 7 days   Get CBC, CMP, Magnesium  -  checked next visit with your primary MD    Activity: As tolerated with Full fall precautions use walker/cane & assistance as needed  Disposition Home    Diet: Heart Healthy Low Carb  Accuchecks 4 times/day, Once in AM empty stomach and then before each meal. Log in all results and show them to your Prim.MD in 3 days. If any glucose reading is under 80 or above 300 call your Prim MD immidiately. Follow Low glucose instructions for glucose under 80 as instructed.   Special Instructions: If you have smoked or chewed Tobacco  in the last 2 yrs please stop smoking, stop any regular Alcohol  and or any Recreational drug use.  On your next visit with your primary care physician please Get Medicines reviewed and adjusted.  Please request your Prim.MD to go over all Hospital Tests and Procedure/Radiological results at the follow up, please get all Hospital records sent to your Prim MD by signing hospital release before you go home.  If you experience worsening of your admission symptoms, develop shortness of breath, life threatening emergency, suicidal or homicidal thoughts you must seek medical attention immediately by calling 911 or calling your MD immediately  if symptoms less severe.  You Must read complete instructions/literature along with all the possible adverse reactions/side effects for all the Medicines you take and that have been prescribed to you. Take any new Medicines after you have completely understood and accpet all the possible adverse reactions/side effects.   Do not drive when taking Pain medications.  Do not take more than prescribed Pain, Sleep and Anxiety Medications  Wear Seat belts while driving.

## 2023-09-15 NOTE — Progress Notes (Signed)
 Pt is independent with CPAP unit.    09/15/23 2119  BiPAP/CPAP/SIPAP  BiPAP/CPAP/SIPAP Pt Type Adult  BiPAP/CPAP/SIPAP Resmed  Mask Type Nasal mask  Dentures removed? Not applicable  Mask Size Small  EPAP 5 cmH2O  FiO2 (%) 21 %  Patient Home Machine No  Patient Home Mask No  Patient Home Tubing No  Auto Titrate No  CPAP/SIPAP surface wiped down Yes  Device Plugged into RED Power Outlet Yes  BiPAP/CPAP /SiPAP Vitals  Pulse Rate 80  Resp 18  SpO2 94 %  MEWS Score/Color  MEWS Score 0  MEWS Score Color Toni Hicks

## 2023-09-15 NOTE — Plan of Care (Signed)

## 2023-09-15 NOTE — Care Management Obs Status (Signed)
 MEDICARE OBSERVATION STATUS NOTIFICATION   Patient Details  Name: Toni Hicks MRN: [id number] Date of Birth: 1950-03-27   Medicare Observation Status Notification Given:  Yes    Marval Gell, RN 09/15/2023, 3:00 PM

## 2023-09-15 NOTE — Progress Notes (Signed)
 PROGRESS NOTE                                                                                                                                                                                                             Patient Demographics:    Toni Hicks, is a 73 y.o. female, DOB - [birth year], FMW:[id number]  Outpatient Primary MD for the patient is Rollene Almarie LABOR, MD    LOS - 0  Admit date - 09/14/2023    Chief Complaint  Patient presents with   Facial Droop       Brief Narrative (HPI from H&P)     73 y.o. female with past medical history  of allergy to Astelin  Nasonex ,Essential hypertension, COPD, hypothyroidism, sleep apnea, history of stroke, history of tobacco abuse, history of alcohol abuse presenting today with left facial droop that had occurred today morning at around 6 AM and last known well was 12 midnight.  Workup suggested acute lacunar infarct and she was admitted for further workup.   Subjective:    Toni Hicks today has, No headache, No chest pain, No abdominal pain - No Nausea, No new weakness tingling or numbness, no SOB, he is excessively tired and washed out says has not slept well in several years.   Assessment  & Plan :   Acute lacunar infarct within the centrum semiovale of the right posterior frontal lobe. 2. Chronic lacunar infarct within the left posterior thalamus - with left-sided facial droop, full stroke workup underway, LDL above goal hence placed on statin, on DAPT, CTA head and neck stable, echo pending, PT OT and speech eval pending, further management per stroke team.  Will monitor.  Overall improving.  Hypertension.  Allow for permissive hypertension due to acute stroke and monitor.    Morbid obesity with OSA.  BMI of 36, follow-up with PCP for weight loss, nighttime CPAP.  Dyslipidemia.  Placed on statin.  Occasional alcohol intake.  Inconsistent history, counseled to abstain  from regular alcohol use, monitor on CIWA protocol.  Chronic insomnia.  Medications adjusted.        Condition - Fair  Family Communication  : Message left for significant other Sam [phone]  on 09/15/2023 at 9:02 AM  Code Status : Full code  Consults  :  Neuro  PUD Prophylaxis :    Procedures  :  MRI brain.  1. Acute lacunar infarct within the centrum semiovale of the right posterior frontal lobe. 2. Chronic lacunar infarct within the left posterior thalamus. 3. Age-related atrophy and moderately advanced periventricular and deep cerebral white matter disease.  CTA head and neck.  1. Mild atherosclerosis in the head and neck without a large vessel occlusion or significant proximal stenosis. 2. Moderate to severe chronic small vessel ischemic disease. 3.  Aortic Atherosclerosis  Echocardiogram.      Disposition Plan  :    Status is: Observation  DVT Prophylaxis  :    heparin  injection 5,000 Units Start: 09/14/23 1315    Lab Results  Component Value Date   PLT 265 09/14/2023    Diet :  Diet Order             Diet heart healthy/carb modified Room service appropriate? Yes; Fluid consistency: Nectar Thick  Diet effective now                    Inpatient Medications  Scheduled Meds:   stroke: early stages of recovery book   Does not apply Once   aspirin   325 mg Oral Once   Followed by   aspirin  EC  81 mg Oral Daily   budesonide -glycopyrrolate -formoterol   2 puff Inhalation BID   clopidogrel   300 mg Oral Once   Followed by   clopidogrel   75 mg Oral Daily   folic acid   1 mg Oral Daily   heparin   5,000 Units Subcutaneous Q12H   melatonin  5 mg Oral QHS   montelukast   10 mg Oral QHS   multivitamin with minerals  1 tablet Oral Daily   rosuvastatin   10 mg Oral Daily   thiamine   100 mg Oral Daily   Or   thiamine   100 mg Intravenous Daily   traZODone   100 mg Oral QHS   Continuous Infusions: PRN Meds:.acetaminophen  **OR** acetaminophen  (TYLENOL ) oral  liquid 160 mg/5 mL **OR** acetaminophen , albuterol , LORazepam  **OR** LORazepam   Antibiotics  :    Anti-infectives (From admission, onward)    None         Objective:   Vitals:   09/15/23 0200 09/15/23 0400 09/15/23 0447 09/15/23 0739  BP: 134/78 (!) 156/68 (!) 154/66   Pulse:  63 64   Resp:  16    Temp:  98.6 F (37 C)    TempSrc:  Oral    SpO2:  93%  91%  Weight:      Height:        Wt Readings from Last 3 Encounters:  09/14/23 88.5 kg  09/11/23 91.2 kg  08/19/23 91.2 kg    No intake or output data in the 24 hours ending 09/15/23 0904   Physical Exam  Awake Alert, No new F.N deficits, Normal affect [place].AT,PERRAL Supple Neck, No JVD,   Symmetrical Chest wall movement, Good air movement bilaterally, CTAB RRR,No Gallops,Rubs or new Murmurs,  +ve B.Sounds, Abd Soft, No tenderness,   No Cyanosis, Clubbing or edema     RN pressure injury documentation:      Data Review:    Recent Labs  Lab 09/14/23 0754 09/14/23 0812  WBC 7.2  --   HGB 15.1* 15.3*  HCT 45.8 45.0  PLT 265  --   MCV 87.4  --   MCH 28.8  --   MCHC 33.0  --   RDW 13.4  --   LYMPHSABS 1.9  --   MONOABS 0.8  --  EOSABS 0.2  --   BASOSABS 0.1  --     Recent Labs  Lab 09/14/23 0754 09/14/23 0812 09/14/23 1522  NA 140 142  --   K 3.6 3.7  --   CL 105 105  --   CO2 22  --   --   ANIONGAP 13  --   --   GLUCOSE 146* 147*  --   BUN 8 9  --   CREATININE 0.73 0.60  --   AST 43*  --   --   ALT 39  --   --   ALKPHOS 84  --   --   BILITOT 0.5  --   --   ALBUMIN 4.1  --   --   INR 0.9  --   --   HGBA1C  --   --  6.4*  CALCIUM  9.1  --   --       Recent Labs  Lab 09/14/23 0754 09/14/23 1522  INR 0.9  --   HGBA1C  --  6.4*  CALCIUM  9.1  --     --------------------------------------------------------------------------------------------------------------- Lab Results  Component Value Date   CHOL 161 09/14/2023   HDL 55 09/14/2023   LDLCALC 90 09/14/2023   TRIG 78  09/14/2023   CHOLHDL 2.9 09/14/2023    Lab Results  Component Value Date   HGBA1C 6.4 (H) 09/14/2023   No results for input(s): TSH, T4TOTAL, FREET4, T3FREE, THYROIDAB in the last 72 hours. No results for input(s): VITAMINB12, FOLATE, FERRITIN, TIBC, IRON, RETICCTPCT in the last 72 hours. ------------------------------------------------------------------------------------------------------------------ Cardiac Enzymes No results for input(s): CKMB, TROPONINI, MYOGLOBIN in the last 168 hours.  Invalid input(s): CK  Micro Results No results found for this or any previous visit (from the past 240 hours).  Radiology Report CT ANGIO HEAD NECK W WO CM Result Date: 09/14/2023 CLINICAL DATA:  Stroke/TIA, determine embolic source.  Facial droop. EXAM: CT ANGIOGRAPHY HEAD AND NECK WITH AND WITHOUT CONTRAST TECHNIQUE: Multidetector CT imaging of the head and neck was performed using the standard protocol during bolus administration of intravenous contrast. Multiplanar CT image reconstructions and MIPs were obtained to evaluate the vascular anatomy. Carotid stenosis measurements (when applicable) are obtained utilizing NASCET criteria, using the distal internal carotid diameter as the denominator. RADIATION DOSE REDUCTION: This exam was performed according to the departmental dose-optimization program which includes automated exposure control, adjustment of the mA and/or kV according to patient size and/or use of iterative reconstruction technique. CONTRAST:  75mL OMNIPAQUE  IOHEXOL  350 MG/ML SOLN COMPARISON:  Head MRI 09/14/2023 FINDINGS: CT HEAD FINDINGS Brain: No acute large territory cortical infarct, intracranial hemorrhage, mass, midline shift, or extra-axial fluid collection is identified. Patchy to confluent hypodensities in the cerebral white matter bilaterally are nonspecific but compatible with moderate to severe chronic small vessel ischemic disease. The acute lacunar  infarct in the posterior right frontal white matter on MRI is not well delineated separate from chronic small vessel ischemia on CT. A chronic lacunar infarct is again noted in the left thalamus. Mild cerebral atrophy is within normal limits for age. Vascular: Calcified atherosclerosis at the skull base. Skull: No fracture or suspicious lesion. Sinuses/Orbits: Mild mucosal thickening and small mucous retention cysts in the paranasal sinuses. Clear mastoid air cells. Unremarkable orbits. Other: None. Review of the MIP images confirms the above findings CTA NECK FINDINGS Aortic arch: Standard branching with mild atherosclerosis. No significant stenosis of the arch vessel origins. Right carotid system: Patent without evidence of stenosis or dissection.  Left carotid system: Patent without evidence of stenosis or dissection. Minimal mixed plaque at the carotid bifurcation. Vertebral arteries: Patent and codominant without evidence of stenosis or dissection. Skeleton: Moderate cervical spondylosis and advanced facet arthrosis. Other neck: No evidence of cervical lymphadenopathy or mass. Upper chest: No mass or consolidation in the included lung apices. Review of the MIP images confirms the above findings CTA HEAD FINDINGS Anterior circulation: The internal carotid arteries are patent from skull base to carotid termini with mild atherosclerosis not resulting in significant stenosis. ACAs and MCAs are patent without evidence of a proximal branch occlusion or significant proximal stenosis. No aneurysm is identified. Posterior circulation: The intracranial vertebral arteries are widely patent to the basilar. Patent right PICA, bilateral AICA, and bilateral SCA origins are visualized. The basilar artery is widely patent. There are large left and diminutive or absent right posterior communicating arteries with hypoplasia of the left P1 segment. Both PCAs are patent without evidence of a significant proximal stenosis. No  aneurysm is identified. Venous sinuses: As permitted by contrast timing, patent. Anatomic variants: Fetal left PCA. Review of the MIP images confirms the above findings IMPRESSION: 1. Mild atherosclerosis in the head and neck without a large vessel occlusion or significant proximal stenosis. 2. Moderate to severe chronic small vessel ischemic disease. 3.  Aortic Atherosclerosis (ICD10-I70.0). Electronically Signed   By: Dasie Hamburg M.D.   On: 09/14/2023 15:59   MR BRAIN WO CONTRAST Result Date: 09/14/2023 EXAM: MRI BRAIN WITHOUT CONTRAST 09/14/2023 11:21:00 AM TECHNIQUE: Multiplanar multisequence MRI of the head/brain was performed without the administration of intravenous contrast. COMPARISON: 04/05/2017 CLINICAL HISTORY: Neuro deficit, acute, stroke suspected. Left lower facial droop. FINDINGS: BRAIN AND VENTRICLES: There is an acute lacunar infarct within the centrum semiovale of the right posterior frontal lobe. There is age-related atrophy and moderately advanced periventricular and deep cerebral white matter disease. There is a chronic lacunar infarct within the left posterior thalamus. No intracranial hemorrhage. No mass. No midline shift. No hydrocephalus. The sella is unremarkable. Normal flow voids. ORBITS: No acute abnormality. SINUSES AND MASTOIDS: There is polypoid mucosal disease present within the maxillary sinuses bilaterally. BONES AND SOFT TISSUES: Normal marrow signal. No acute soft tissue abnormality. IMPRESSION: 1. Acute lacunar infarct within the centrum semiovale of the right posterior frontal lobe. 2. Chronic lacunar infarct within the left posterior thalamus. 3. Age-related atrophy and moderately advanced periventricular and deep cerebral white matter disease. Electronically signed by: Evalene Coho MD 09/14/2023 11:30 AM EDT RP Workstation: [id number]     Signature  -   Lavada Stank M.D on 09/15/2023 at 9:04 AM   -  To page go to [email or web address]

## 2023-09-15 NOTE — Progress Notes (Addendum)
 PT Cancellation Note  Patient Details Name: Toni Hicks MRN: [id number] DOB: 1950/08/05   Cancelled Treatment:    Reason Eval/Treat Not Completed:   11:31- Pt off unit for procedure.   13:30- Per husband, pt just fell asleep with request to re-attempt this afternoon or tomorrow. Will re-attempt as schedule allows. Acute PT to follow.  Kate ORN, PT, DPT Secure Chat Preferred  Rehab Office [phone]   Kate BRAVO Wendolyn 09/15/2023, 11:31 AM

## 2023-09-15 NOTE — Evaluation (Signed)
 Physical Therapy Evaluation Patient Details Name: Toni Hicks MRN: [id number] DOB: 1950-07-21 Today's Date: 09/15/2023  History of Present Illness  73 y.o. female presents to [facility] 09/14/23 with L facial droop. MRI brain showed an acute lacunar infarct of the right posterior frontal lobe, chronic lacunar infarct in the left posterior thalamus. PMHx: HTN, COPD, CVA, tobacco and alcohol abuse   Clinical Impression  PTA, pt was independent for mobility with no AD. Pt presents at functional mobility baseline being ModI for all mobility. Pt reported feeling unsteady, however, had no overt LOB and scored a 21/24 on the DGI indicating that pt is not at an increased risk of falling. Pt preferred to have a referral for OP PT for further balance assessment and to work on getting back to PLOF. Discussed walking program and progressive exercise with pt verbalizing understanding. Pt has 24/7 level of assist upon d/c home. Pt has no further acute PT needs with acute PT signing off. Please re-consult if new needs arise.         If plan is discharge home, recommend the following: Assist for transportation;Help with stairs or ramp for entrance   Can travel by private vehicle   Yes     Equipment Recommendations None recommended by PT     Functional Status Assessment Patient has had a recent decline in their functional status and demonstrates the ability to make significant improvements in function in a reasonable and predictable amount of time.     Precautions / Restrictions Precautions Precautions: Fall Recall of Precautions/Restrictions: Intact Restrictions Weight Bearing Restrictions Per Provider Order: No      Mobility  Bed Mobility Overal bed mobility: Modified Independent     Transfers Overall transfer level: Modified independent Equipment used: None  General transfer comment: No LOB, no AD    Ambulation/Gait Ambulation/Gait assistance: Modified independent (Device/Increase time) Gait  Distance (Feet): 400 Feet Assistive device: None Gait Pattern/deviations: Step-through pattern, Decreased stride length Gait velocity: decr     General Gait Details: increased medial/lateral sway with steady gait pattern. Reported she felt unsteady    Modified Rankin (Stroke Patients Only) Modified Rankin (Stroke Patients Only) Pre-Morbid Rankin Score: No symptoms Modified Rankin: Slight disability     Balance    Standardized Balance Assessment Standardized Balance Assessment : Dynamic Gait Index   Dynamic Gait Index Level Surface: Normal Change in Gait Speed: Normal Gait with Horizontal Head Turns: Mild Impairment Gait with Vertical Head Turns: Mild Impairment Gait and Pivot Turn: Normal Step Over Obstacle: Normal Step Around Obstacles: Normal Steps: Mild Impairment Total Score: 21       Pertinent Vitals/Pain Pain Assessment Pain Assessment: No/denies pain    Home Living Family/patient expects to be discharged to:: Private residence Living Arrangements: Spouse/significant other Available Help at Discharge: Family Type of Home: House Home Access: Stairs to enter Entrance Stairs-Rails: None Entrance Stairs-Number of Steps: 2   Home Layout: Laundry or work area in basement;One level Home Equipment: Agricultural consultant (2 wheels);Cane - single point;BSC/3in1;Shower seat      Prior Function Prior Level of Function : Independent/Modified Independent      Mobility Comments: No AD ADLs Comments: mod I     Extremity/Trunk Assessment   Upper Extremity Assessment Upper Extremity Assessment: Defer to OT evaluation    Lower Extremity Assessment Lower Extremity Assessment: LLE deficits/detail LLE Deficits / Details: Hip flexion 4/5, Knee ext 5/5, Ankle DF 5/5 (prior L hip fx) LLE Sensation: WNL LLE Coordination: WNL    Cervical /  Trunk Assessment Cervical / Trunk Assessment: Normal  Communication   Communication Communication: Impaired Factors Affecting  Communication: Reduced clarity of speech (L sided facial droop)    Cognition Arousal: Alert Behavior During Therapy: WFL for tasks assessed/performed   PT - Cognitive impairments: No apparent impairments      Following commands: Intact       Cueing Cueing Techniques: Verbal cues     General Comments General comments (skin integrity, edema, etc.): Spouse present for session and supportive     PT Assessment All further PT needs can be met in the next venue of care  PT Problem List Decreased strength;Decreased balance           PT Goals (Current goals can be found in the Care Plan section)  Acute Rehab PT Goals PT Goal Formulation: All assessment and education complete, DC therapy     AM-PAC PT 6 Clicks Mobility  Outcome Measure Help needed turning from your back to your side while in a flat bed without using bedrails?: None Help needed moving from lying on your back to sitting on the side of a flat bed without using bedrails?: None Help needed moving to and from a bed to a chair (including a wheelchair)?: None Help needed standing up from a chair using your arms (e.g., wheelchair or bedside chair)?: None Help needed to walk in hospital room?: None Help needed climbing 3-5 steps with a railing? : None 6 Click Score: 24    End of Session   Activity Tolerance: Patient tolerated treatment well Patient left: in bed;with call bell/phone within reach;with family/visitor present Nurse Communication: Mobility status PT Visit Diagnosis: Other abnormalities of gait and mobility (R26.89);Muscle weakness (generalized) (M62.81)    Time: 8463-8444 PT Time Calculation (min) (ACUTE ONLY): 19 min   Charges:   PT Evaluation $PT Eval Low Complexity: 1 Low   PT General Charges $$ ACUTE PT VISIT: 1 Visit       Kate ORN, PT, DPT Secure Chat Preferred  Rehab Office [phone]   Kate BRAVO Wendolyn 09/15/2023, 4:22 PM

## 2023-09-15 NOTE — Progress Notes (Signed)
  Echocardiogram 2D Echocardiogram has been performed.  LAMON MAXWELL 09/15/2023, 11:46 AM

## 2023-09-15 NOTE — Evaluation (Signed)
 Occupational Therapy Evaluation & Discharge Patient Details Name: Toni Hicks MRN: [id number] DOB: 03/10/1950 Today's Date: 09/15/2023   History of Present Illness   73 y.o. female presents to [facility] 09/14/23 with L facial droop. MRI brain showed an acute lacunar infarct of the right posterior frontal lobe, chronic lacunar infarct in the left posterior thalamus. PMHx: HTN, COPD, CVA, tobacco and alcohol abuse     Clinical Impressions Pt admitted based on above, and was seen based on problem list below. PTA pt was living with spouse and reports mod I with ADLs and IADLs. Today pt is at her functional baseline of mod I with ADLs. Pt received dressed, able to don shoes and complete standing ADLs with no AD, mod I. Pt completed functional mobility no AD, only mild deficits observed but will defer to PT for further assessment. Pt reporting no visual changes, assessment completed, WFL no visual field deficits or nystagmus noted.  AROM, strength, FMC equal and WFL of BUE. Bil coordination and depth perception all WFL. Pt and spouse reporting no self-care concerns upon d/c, no follow up OT needs. Pt at functional baseline, no further acute OT needs, OT is signing off on this pt.       If plan is discharge home, recommend the following:   Assist for transportation     Functional Status Assessment   Patient has not had a recent decline in their functional status     Equipment Recommendations   None recommended by OT      Precautions/Restrictions   Precautions Precautions: Fall Recall of Precautions/Restrictions: Intact Restrictions Weight Bearing Restrictions Per Provider Order: No     Mobility Bed Mobility Overal bed mobility: Modified Independent      Transfers Overall transfer level: Modified independent Equipment used: None     General transfer comment: No LOB, no AD      Balance Overall balance assessment: Mild deficits observed, not formally tested          ADL either performed or assessed with clinical judgement   ADL Overall ADL's : Modified independent;At baseline     General ADL Comments: Pt received dressed, able to don shoes stand unsupported for standing ADLs     Vision Baseline Vision/History: 0 No visual deficits Patient Visual Report: No change from baseline Vision Assessment?: Yes Eye Alignment: Within Functional Limits Ocular Range of Motion: Within Functional Limits Alignment/Gaze Preference: Within Defined Limits Tracking/Visual Pursuits: Able to track stimulus in all quads without difficulty Saccades: Within functional limits Convergence: Within functional limits Visual Fields: No apparent deficits Additional Comments: WFL, no nystagmus or other deficits noted     Perception Perception: Within Functional Limits       Praxis Praxis: WFL       Pertinent Vitals/Pain Pain Assessment Pain Assessment: No/denies pain     Extremity/Trunk Assessment Upper Extremity Assessment Upper Extremity Assessment: Overall WFL for tasks assessed (Good BUE strength, FMC, sensation intact)   Lower Extremity Assessment Lower Extremity Assessment: Defer to PT evaluation   Cervical / Trunk Assessment Cervical / Trunk Assessment: Normal   Communication Communication Communication: Impaired Factors Affecting Communication: Reduced clarity of speech (L sided facial droop)   Cognition Arousal: Alert Behavior During Therapy: WFL for tasks assessed/performed Cognition: No apparent impairments       Following commands: Intact       Cueing  General Comments   Cueing Techniques: Verbal cues  Spouse present for session and supportive  Home Living Family/patient expects to be discharged to:: Private residence Living Arrangements: Spouse/significant other Available Help at Discharge: Family Type of Home: House Home Access: Stairs to enter Secretary/administrator of Steps: 2 Entrance Stairs-Rails: None Home  Layout: Laundry or work area in basement;One level     Bathroom Shower/Tub: Tub/shower unit;Walk-in shower   Bathroom Toilet: Handicapped height Bathroom Accessibility: Yes How Accessible: Accessible via walker Home Equipment: Rolling Walker (2 wheels);Cane - single point;BSC/3in1;Shower seat          Prior Functioning/Environment Prior Level of Function : Independent/Modified Independent             Mobility Comments: No AD ADLs Comments: Ind    OT Problem List: Impaired balance (sitting and/or standing)        OT Goals(Current goals can be found in the care plan section)   Acute Rehab OT Goals Patient Stated Goal: To go home OT Goal Formulation: All assessment and education complete, DC therapy Time For Goal Achievement: 09/29/23 Potential to Achieve Goals: Good   AM-PAC OT 6 Clicks Daily Activity     Outcome Measure Help from another person eating meals?: None Help from another person taking care of personal grooming?: None Help from another person toileting, which includes using toliet, bedpan, or urinal?: None Help from another person bathing (including washing, rinsing, drying)?: None Help from another person to put on and taking off regular upper body clothing?: None Help from another person to put on and taking off regular lower body clothing?: None 6 Click Score: 24   End of Session Nurse Communication: Mobility status  Activity Tolerance: Patient tolerated treatment well Patient left: Other (comment) (With PT in hallway)  OT Visit Diagnosis: Unsteadiness on feet (R26.81)                Time: 8473-8464 OT Time Calculation (min): 9 min Charges:  OT General Charges $OT Visit: 1 Visit OT Evaluation $OT Eval Moderate Complexity: 1 Mod  Lamarcus Spira C, OT  Acute Rehabilitation Services Office [phone] Secure chat preferred   Adrianne GORMAN Savers 09/15/2023, 3:52 PM

## 2023-09-15 NOTE — Progress Notes (Addendum)
 STROKE TEAM PROGRESS NOTE    INTERIM HISTORY/SUBJECTIVE Patient presented with slurred speech and left facial weakness which has persisted.  No family at the side.  Patient sitting in the chair in no apparent distress MRI brain with acute lacunar infarct in the centrum semiovale of right posterior frontal lobe.  His old left thalamic lacunar infarct.  CT angiogram showed no large vessel stenosis or occlusion.  Urine drug screen positive for marijuana.  LDL cholesterol is elevated 90 mg percent.  Hemoglobin A1c 6.4.   CBC    Component Value Date/Time   WBC 7.2 09/14/2023 0754   RBC 5.24 (H) 09/14/2023 0754   HGB 15.3 (H) 09/14/2023 0812   HGB 15.7 11/28/2017 1037   HCT 45.0 09/14/2023 0812   HCT 45.5 11/28/2017 1037   PLT 265 09/14/2023 0754   PLT 289 11/28/2017 1037   MCV 87.4 09/14/2023 0754   MCV 88 11/28/2017 1037   MCH 28.8 09/14/2023 0754   MCHC 33.0 09/14/2023 0754   RDW 13.4 09/14/2023 0754   RDW 12.7 11/28/2017 1037   LYMPHSABS 1.9 09/14/2023 0754   LYMPHSABS 1.5 11/28/2017 1037   MONOABS 0.8 09/14/2023 0754   EOSABS 0.2 09/14/2023 0754   EOSABS 0.5 (H) 11/28/2017 1037   BASOSABS 0.1 09/14/2023 0754   BASOSABS 0.1 11/28/2017 1037    BMET    Component Value Date/Time   NA 142 09/14/2023 0812   NA 140 11/28/2017 1037   K 3.7 09/14/2023 0812   CL 105 09/14/2023 0812   CO2 22 09/14/2023 0754   GLUCOSE 147 (H) 09/14/2023 0812   BUN 9 09/14/2023 0812   BUN 10 11/28/2017 1037   CREATININE 0.60 09/14/2023 0812   CREATININE 0.65 08/06/2019 1054   CALCIUM  9.1 09/14/2023 0754   GFRNONAA >60 09/14/2023 0754    IMAGING past 24 hours CT ANGIO HEAD NECK W WO CM Result Date: 09/14/2023 CLINICAL DATA:  Stroke/TIA, determine embolic source.  Facial droop. EXAM: CT ANGIOGRAPHY HEAD AND NECK WITH AND WITHOUT CONTRAST TECHNIQUE: Multidetector CT imaging of the head and neck was performed using the standard protocol during bolus administration of intravenous contrast.  Multiplanar CT image reconstructions and MIPs were obtained to evaluate the vascular anatomy. Carotid stenosis measurements (when applicable) are obtained utilizing NASCET criteria, using the distal internal carotid diameter as the denominator. RADIATION DOSE REDUCTION: This exam was performed according to the departmental dose-optimization program which includes automated exposure control, adjustment of the mA and/or kV according to patient size and/or use of iterative reconstruction technique. CONTRAST:  75mL OMNIPAQUE  IOHEXOL  350 MG/ML SOLN COMPARISON:  Head MRI 09/14/2023 FINDINGS: CT HEAD FINDINGS Brain: No acute large territory cortical infarct, intracranial hemorrhage, mass, midline shift, or extra-axial fluid collection is identified. Patchy to confluent hypodensities in the cerebral white matter bilaterally are nonspecific but compatible with moderate to severe chronic small vessel ischemic disease. The acute lacunar infarct in the posterior right frontal white matter on MRI is not well delineated separate from chronic small vessel ischemia on CT. A chronic lacunar infarct is again noted in the left thalamus. Mild cerebral atrophy is within normal limits for age. Vascular: Calcified atherosclerosis at the skull base. Skull: No fracture or suspicious lesion. Sinuses/Orbits: Mild mucosal thickening and small mucous retention cysts in the paranasal sinuses. Clear mastoid air cells. Unremarkable orbits. Other: None. Review of the MIP images confirms the above findings CTA NECK FINDINGS Aortic arch: Standard branching with mild atherosclerosis. No significant stenosis of the arch vessel origins. Right carotid  system: Patent without evidence of stenosis or dissection. Left carotid system: Patent without evidence of stenosis or dissection. Minimal mixed plaque at the carotid bifurcation. Vertebral arteries: Patent and codominant without evidence of stenosis or dissection. Skeleton: Moderate cervical spondylosis  and advanced facet arthrosis. Other neck: No evidence of cervical lymphadenopathy or mass. Upper chest: No mass or consolidation in the included lung apices. Review of the MIP images confirms the above findings CTA HEAD FINDINGS Anterior circulation: The internal carotid arteries are patent from skull base to carotid termini with mild atherosclerosis not resulting in significant stenosis. ACAs and MCAs are patent without evidence of a proximal branch occlusion or significant proximal stenosis. No aneurysm is identified. Posterior circulation: The intracranial vertebral arteries are widely patent to the basilar. Patent right PICA, bilateral AICA, and bilateral SCA origins are visualized. The basilar artery is widely patent. There are large left and diminutive or absent right posterior communicating arteries with hypoplasia of the left P1 segment. Both PCAs are patent without evidence of a significant proximal stenosis. No aneurysm is identified. Venous sinuses: As permitted by contrast timing, patent. Anatomic variants: Fetal left PCA. Review of the MIP images confirms the above findings IMPRESSION: 1. Mild atherosclerosis in the head and neck without a large vessel occlusion or significant proximal stenosis. 2. Moderate to severe chronic small vessel ischemic disease. 3.  Aortic Atherosclerosis (ICD10-I70.0). Electronically Signed   By: Dasie Hamburg M.D.   On: 09/14/2023 15:59   MR BRAIN WO CONTRAST Result Date: 09/14/2023 EXAM: MRI BRAIN WITHOUT CONTRAST 09/14/2023 11:21:00 AM TECHNIQUE: Multiplanar multisequence MRI of the head/brain was performed without the administration of intravenous contrast. COMPARISON: 04/05/2017 CLINICAL HISTORY: Neuro deficit, acute, stroke suspected. Left lower facial droop. FINDINGS: BRAIN AND VENTRICLES: There is an acute lacunar infarct within the centrum semiovale of the right posterior frontal lobe. There is age-related atrophy and moderately advanced periventricular and deep  cerebral white matter disease. There is a chronic lacunar infarct within the left posterior thalamus. No intracranial hemorrhage. No mass. No midline shift. No hydrocephalus. The sella is unremarkable. Normal flow voids. ORBITS: No acute abnormality. SINUSES AND MASTOIDS: There is polypoid mucosal disease present within the maxillary sinuses bilaterally. BONES AND SOFT TISSUES: Normal marrow signal. No acute soft tissue abnormality. IMPRESSION: 1. Acute lacunar infarct within the centrum semiovale of the right posterior frontal lobe. 2. Chronic lacunar infarct within the left posterior thalamus. 3. Age-related atrophy and moderately advanced periventricular and deep cerebral white matter disease. Electronically signed by: Evalene Coho MD 09/14/2023 11:30 AM EDT RP Workstation: [id number]    Vitals:   09/15/23 0200 09/15/23 0400 09/15/23 0447 09/15/23 0739  BP: 134/78 (!) 156/68 (!) 154/66   Pulse:  63 64   Resp:  16    Temp:  98.6 F (37 C)    TempSrc:  Oral    SpO2:  93%  91%  Weight:      Height:         PHYSICAL EXAM General:  Alert, well-nourished, well-developed patient in no acute distress Psych:  Mood and affect appropriate for situation CV: Regular rate and rhythm on monitor Respiratory:  Regular, unlabored respirations on room air GI: Abdomen soft and nontender   NEURO:  Mental Status: AA&Ox3, patient is able to give clear and coherent history Speech/Language: speech is without aphasia.  Dysarthric speech naming, repetition, fluency, and comprehension intact.  Cranial Nerves:  II: PERRL. Visual fields full.  III, IV, VI: EOMI. Eyelids elevate symmetrically.  V: Sensation is  intact to light touch and symmetrical to face.  VII: Left facial VIII: hearing intact to voice. IX, X: Palate elevates symmetrically. Phonation is normal.  KP:Dynloizm shrug 5/5. XII: tongue is midline without fasciculations. Motor: 5/5 strength to all muscle groups tested.  Tone: is normal and  bulk is normal Sensation- Intact to light touch bilaterally. Extinction absent to light touch to DSS.   Coordination: FTN intact bilaterally, HKS: no ataxia in BLE.No drift.  Gait- deferred  Most Recent NIH  1a Level of Conscious.:  1b LOC Questions:  1c LOC Commands:  2 Best Gaze:  3 Visual:  4 Facial Palsy: 1 5a Motor Arm - left:  5b Motor Arm - Right:  6a Motor Leg - Left:  6b Motor Leg - Right:  7 Limb Ataxia:  8 Sensory:  9 Best Language:  10 Dysarthria:  11 Extinct. and Inatten.:  TOTAL: 1   ASSESSMENT/PLAN  Toni Hicks is a 73 y.o. female with history of  left posterior thalamic CVA in 2016 with residual fluctuating right arm and face numbness (usually just the 3-5 digits on the right hand, the right nare, and corner of right mouth but at times involving the right forearm), EtOH use (improved from 1.5 bottles of wine per day to 2 glasses of wine daily per daughter), OSA on CPAP, HTN, anxiety, depression, RLS, remote tobacco use, and substance abuse who presented to the ED this morning for evaluation of left facial weakness and dysarthria.  NIH on Admission 3  Acute Ischemic Infarct:  right lacunar infarct and centrum semiovale of right posterior frontal lobe  Etiology: Small vessel disease CT head No acute abnormality. Small vessel disease. Atrophy.    CTA head & neck no LVO MRI acute lacunar infarct in centrum semiovale of right posterior frontal lobe.  Chronic lacunar infarct in left posterior thalamus, atrophy and moderately advanced periventricular and deep cerebral white matter disease 2D Echo ordered LDL 90 HgbA1c 6.4 VTE prophylaxis -heparin  subcu No antithrombotic prior to admission, now on aspirin  81 mg daily and clopidogrel  75 mg daily for 3 weeks and then aspirin  alone. Therapy recommendations:  Pending Disposition: Pending  Hx of Stroke/TIA Chronic lacunar infarct in left posterior thalamus  Hypertension Home meds: Amlodipine  5 mg, Stable Blood  Pressure Goal: SBP less than 160   Hyperlipidemia Home meds: None,  LDL 90, goal < 70 Add Crestor  20 mg  Continue statin at discharge  Diabetes type II UnControlled Home meds: None HgbA1c 6.4, goal < 7.0 CBGs SSI Recommend close follow-up with PCP for better DM control  Former tobacco Abuse Noted  EtOH use ETOH use, alcohol level <15, advised to drink no more than 1 drink(s) a day       Ready to quit? Yes TOC consult for cessation placed  Dysphagia Patient has post-stroke dysphagia, SLP consulted    Diet   Diet heart healthy/carb modified Room service appropriate? Yes; Fluid consistency: Nectar Thick   Advance diet as tolerated  Other Stroke Risk Factors Obesity, Body mass index is 36.84 kg/m., BMI >/= 30 associated with increased stroke risk, recommend weight loss, diet and exercise as appropriate  Obstructive sleep apnea,    Other Active Problems COPD Hypothyroidism Anxiety and depression ADD Restless leg syndrome  Hospital day # 0   Karna Geralds DNP, ACNPC-AG  [facility]  I have personally obtained history,examined this patient, reviewed notes, independently viewed imaging studies, participated in medical decision making and plan of care.ROS completed by me personally  and pertinent positives fully documented  I have made any additions or clarifications directly to the above note. Agree with note above.  She presented with sudden onset of slurred speech and left facial weakness which appears to be improving but is not back to baseline yet.  MRI confirms small right subcortical lacunar infarct as well as an old left thalamic lacunar infarct of remote age.  Neurovascular imaging is negative.  Recommend aspirin  and Plavix  for 3 weeks followed by aspirin  alone and aggressive risk factor modification.  Patient counseled to quit using marijuana and smoking cigarettes.  Mobilize out of bed.  Therapy consults.  Discussed with Dr. Dennise   I personally spent a  total of 50 minutes in the care of the patient today including getting/reviewing separately obtained history, performing a medically appropriate exam/evaluation, counseling and educating, placing orders, referring and communicating with other health care professionals, documenting clinical information in the EHR, independently interpreting results, and coordinating care.         Eather Popp, MD Medical Director [facility] Pager: [phone] 09/15/2023 1:48 PM    To contact Stroke Continuity provider, please refer to [email or web address]. After hours, contact General Neurology

## 2023-09-15 NOTE — Progress Notes (Signed)
 Initial Nutrition Assessment  DOCUMENTATION CODES:   Obesity unspecified  INTERVENTION:   -Liberalize diet to 2 gram sodium diet with nectar thick liquids for wider variety of meal selections -Continue MVI with minerals daily -Ensure Plus High Protein po BID, each supplement provides 350 kcal and 20 grams of protein  -Continue 1 mg folic acid  daily -Continue 100 mg thiamine  daily  NUTRITION DIAGNOSIS:   Increased nutrient needs related to acute illness as evidenced by estimated needs.  GOAL:   Patient will meet greater than or equal to 90% of their needs  MONITOR:   PO intake, Supplement acceptance, Diet advancement  REASON FOR ASSESSMENT:   Consult Assessment of nutrition requirement/status  ASSESSMENT:   Pt with past medical history  of allergy to Astelin  Nasonex ,Essential hypertension, COPD, hypothyroidism, sleep apnea, history of stroke, history of tobacco abuse, history of alcohol abuse presenting today with left facial droop that had occurred morning of admission at around 6 AM and last known well was 12 midnight.  Patient states her tongue felt a little odd and it like it was bigger than the size of her mouth.  Patient states that her last stroke was about 10 years ago and she has been told that she was a prediabetic and since then has controlled everything with diet.  At bedside patient is alert awake oriented gives history and is otherwise nonfocal.  Pt admitted with acute lunar infarct of the rt frontal lobe.   9/6- MRI of brain revealed acute lacunar infarct in the centrum semiovale of right posterior frontal lobe   Reviewed I/O's: -300 ml x 24 hours  UOP: 300 ml x 24 hours  Pt unavailable at time of visit. Attempted to speak with pt via call to hospital room phone, however, unable to reach. RD unable to obtain further nutrition-related history or complete nutrition-focused physical exam at this time.    Pt currently on a heart healthy, carb modified diet with  nectar thick liquids. No meal completion data available to assess at this time.   Wt has been stable over the past year. Per nursing assessment, pt with generalized edema, which may be masking true weight loss as well as fat and muscle depletions.  Medications reviewed and include folic acid , melatoning, MVI, crestor , and thiamine .  Lab Results  Component Value Date   HGBA1C 6.4 (H) 09/14/2023   PTA DM medications are 7.5 mg tirzepatide  weekly.   Labs reviewed: CBGS: 144 (inpatient orders for glycemic control are none).    Diet Order:   Diet Order             Diet heart healthy/carb modified Room service appropriate? Yes; Fluid consistency: Nectar Thick  Diet effective now                   EDUCATION NEEDS:   No education needs have been identified at this time  Skin:  Skin Assessment: Reviewed RN Assessment  Last BM:  09/13/23  Height:   Ht Readings from Last 1 Encounters:  09/14/23 5' 1 (1.549 m)    Weight:   Wt Readings from Last 1 Encounters:  09/14/23 88.5 kg    Ideal Body Weight:  47.7 kg  BMI:  Body mass index is 36.84 kg/m.  Estimated Nutritional Needs:   Kcal:  1500-1700  Protein:  75-90 grams  Fluid:  1.5-1.7 L    Margery ORN, RD, LDN, CDCES Registered Dietitian III Certified Diabetes Care and Education Specialist If unable to reach this RD,  please use RD Inpatient group chat on secure chat between hours of 8am-4 pm daily

## 2023-09-16 ENCOUNTER — Other Ambulatory Visit (HOSPITAL_COMMUNITY): Payer: Self-pay

## 2023-09-16 DIAGNOSIS — I639 Cerebral infarction, unspecified: Secondary | ICD-10-CM | POA: Diagnosis not present

## 2023-09-16 DIAGNOSIS — R29810 Facial weakness: Secondary | ICD-10-CM | POA: Diagnosis not present

## 2023-09-16 MED ORDER — AMLODIPINE BESYLATE 5 MG PO TABS: 5.0000 mg | ORAL_TABLET | Freq: Every day | ORAL | Status: AC

## 2023-09-16 MED ORDER — TRAZODONE HCL 100 MG PO TABS
100.0000 mg | ORAL_TABLET | Freq: Every day | ORAL | 0 refills | Status: DC
  Filled 2023-09-16: qty 10, 10d supply, fill #0

## 2023-09-16 MED ORDER — MELATONIN 5 MG PO TABS
5.0000 mg | ORAL_TABLET | Freq: Every day | ORAL | 0 refills | Status: AC
  Filled 2023-09-16: qty 10, 10d supply, fill #0

## 2023-09-16 NOTE — Care Management (Signed)
 Transition of Care [facility]) - Inpatient Brief Assessment   Patient Details  Name: Toni Hicks MRN: [id number] Date of Birth: 07/02/1950  Transition of Care [facility] CM/SW Contact:    Corean JAYSON Canary, RN Phone Number: 09/16/2023, 7:44 AM   Clinical Narrative: Patient presented with stroke would prefer OP rehab, referral sent to [facility] neuro placed on AVS. NO further needs , has assistance at home   Transition of Care Asessment: Insurance and Status: Insurance coverage has been reviewed Patient has primary care physician: Yes Home environment has been reviewed: Home with assistance Prior level of function:: with assist Prior/Current Home Services: No current home services Social Drivers of Health Review: SDOH reviewed no interventions necessary Readmission risk has been reviewed: Yes Transition of care needs: transition of care needs identified, TOC will continue to follow

## 2023-09-16 NOTE — Discharge Summary (Signed)
 Toni Hicks FMW:[id number] DOB: 1950/06/06 DOA: 09/14/2023  PCP: Rollene Almarie LABOR, MD  Admit date: 09/14/2023  Discharge date: 09/16/2023  Admitted From: Home   Disposition:  Home   Recommendations for Outpatient Follow-up:   Follow up with PCP in 1-2 weeks  PCP Please obtain BMP/CBC, 2 view CXR in 1week,  (see Discharge instructions)   PCP Please follow up on the following pending results:    Home Health: None   Equipment/Devices: None  Consultations: Neuro Discharge Condition: Stable    CODE STATUS: Full    Diet Recommendation: Heart Healthy Low Carb    Chief Complaint  Patient presents with   Facial Droop     Brief history of present illness from the day of admission and additional interim summary    73 y.o. female with past medical history  of allergy to Astelin  Nasonex ,Essential hypertension, COPD, hypothyroidism, sleep apnea, history of stroke, history of tobacco abuse, history of alcohol abuse presenting today with left facial droop that had occurred today morning at around 6 AM and last known well was 12 midnight.  Workup suggested acute lacunar infarct and she was admitted for further workup.                                                                  Hospital Course   Acute lacunar infarct within the centrum semiovale of the right posterior frontal lobe. 2. Chronic lacunar infarct within the left posterior thalamus - with left-sided facial droop, full stroke workup underway, LDL above goal hence placed on statin, on DAPT, CTA head and neck stable, stable echocardiogram, full stroke workup done case discussed with stroke team, will be discharged home on DAPT for 3 weeks thereafter aspirin  only, continue statin.  Postdischarge follow-up with PCP and neurology.  Patient back to baseline, no  dysphagia, drinking liquids soda and eating regular consistency diet this morning without any problems.  Will be discharged home with outpatient follow-up.   Hypertension.  Allow for permissive hypertension the next few days then reintroduce home blood pressure medication as below.   Morbid obesity with OSA.  BMI of 36, follow-up with PCP for weight loss, nighttime CPAP.  He is currently not taking her GLP-1 medications as she informed us .   Dyslipidemia.  Placed on statin.   Occasional alcohol intake.  Inconsistent history, counseled to abstain from regular alcohol use, monitor on CIWA protocol.   Chronic insomnia.  Medications adjusted with good effect, will discharge on the same with outpatient PCP follow-up.   Discharge diagnosis     Principal Problem:   Facial droop due to acute stroke [facility])    Discharge instructions    Discharge Instructions     Discharge instructions   Complete by: As directed    Follow with Primary MD  Rollene Almarie LABOR, MD in 7 days   Get CBC, CMP, Magnesium  -  checked next visit with your primary MD    Activity: As tolerated with Full fall precautions use walker/cane & assistance as needed  Disposition Home    Diet: Heart Healthy Low Carb  Accuchecks 4 times/day, Once in AM empty stomach and then before each meal. Log in all results and show them to your Prim.MD in 3 days. If any glucose reading is under 80 or above 300 call your Prim MD immidiately. Follow Low glucose instructions for glucose under 80 as instructed.   Special Instructions: If you have smoked or chewed Tobacco  in the last 2 yrs please stop smoking, stop any regular Alcohol  and or any Recreational drug use.  On your next visit with your primary care physician please Get Medicines reviewed and adjusted.  Please request your Prim.MD to go over all Hospital Tests and Procedure/Radiological results at the follow up, please get all Hospital records sent to your Prim MD by  signing hospital release before you go home.  If you experience worsening of your admission symptoms, develop shortness of breath, life threatening emergency, suicidal or homicidal thoughts you must seek medical attention immediately by calling 911 or calling your MD immediately  if symptoms less severe.  You Must read complete instructions/literature along with all the possible adverse reactions/side effects for all the Medicines you take and that have been prescribed to you. Take any new Medicines after you have completely understood and accpet all the possible adverse reactions/side effects.   Do not drive when taking Pain medications.  Do not take more than prescribed Pain, Sleep and Anxiety Medications  Wear Seat belts while driving.   Increase activity slowly   Complete by: As directed        Discharge Medications   Allergies as of 09/16/2023       Reactions   Astelin  [azelastine ] Other (See Comments)   Per patient caused psychiatric illness   Nasonex  [mometasone ] Other (See Comments)   Per patient caused psychiatric illness   Other    Can not eat carbohydrates without protein, is allergic to certain foods but can take in certain doses        Medication List     STOP taking these medications    DULoxetine  20 MG capsule Commonly known as: CYMBALTA    DULoxetine  30 MG capsule Commonly known as: Cymbalta    meloxicam  15 MG tablet Commonly known as: MOBIC    meloxicam  7.5 MG tablet Commonly known as: MOBIC    Zepbound  2.5 MG/0.5ML Pen Generic drug: tirzepatide    Zepbound  5 MG/0.5ML Pen Generic drug: tirzepatide    Zepbound  7.5 MG/0.5ML Pen Generic drug: tirzepatide        TAKE these medications    albuterol  108 (90 Base) MCG/ACT inhaler Commonly known as: VENTOLIN  HFA INHALE 1-2 PUFFS BY MOUTH EVERY 6 HOURS AS NEEDED FOR WHEEZE OR SHORTNESS OF BREATH   amLODipine  5 MG tablet Commonly known as: NORVASC  Take 1 tablet (5 mg total) by mouth daily. Start  taking on: September 18, 2023 What changed: These instructions start on September 18, 2023. If you are unsure what to do until then, ask your doctor or other care provider.   aspirin  EC 81 MG tablet Take 1 tablet (81 mg total) by mouth daily. Swallow whole.   b complex vitamins tablet Take 1 tablet by mouth 3 (three) times a week.   clopidogrel  75 MG tablet Commonly known as: Plavix  Take  1 tablet (75 mg total) by mouth daily for 20 days.   Dupixent  300 MG/2ML Soaj Generic drug: Dupilumab  Inject 300 mg into the skin every 14 (fourteen) days.   EPINEPHrine  0.3 mg/0.3 mL Soaj injection Commonly known as: EPI-PEN Inject 0.3 mg into the muscle as needed for anaphylaxis.   hydrocortisone  2.5 % ointment Apply topically 2 (two) times daily.   melatonin 5 MG Tabs Take 1 tablet (5 mg total) by mouth at bedtime.   montelukast  10 MG tablet Commonly known as: SINGULAIR  TAKE 1 TABLET BY MOUTH EVERYDAY AT BEDTIME   rosuvastatin  10 MG tablet Commonly known as: CRESTOR  Take 1 tablet (10 mg total) by mouth daily.   traZODone  100 MG tablet Commonly known as: DESYREL  Take 1 tablet (100 mg total) by mouth at bedtime.   Trelegy Ellipta  100-62.5-25 MCG/ACT Aepb Generic drug: Fluticasone -Umeclidin-Vilant INHALE 1 PUFF THEN RINSE MOUTH, ONCE DAILY   Vitamin D  (Ergocalciferol ) 1.25 MG (50000 UNIT) Caps capsule Commonly known as: DRISDOL  Take 1 capsule (50,000 Units total) by mouth every 7 (seven) days.         Follow-up Information     Rollene Almarie LABOR, MD. Schedule an appointment as soon as possible for a visit in 1 week(s).   Specialty: Internal Medicine Contact information: [address] [place] [address] [phone]         [facility]. Schedule an appointment as soon as possible for a visit in 2 week(s).   Contact information: [address]     [address] [place]  [address] [phone]                Major  procedures and Radiology Reports - PLEASE review detailed and final reports thoroughly  -      ECHOCARDIOGRAM COMPLETE BUBBLE STUDY Result Date: 09/15/2023    ECHOCARDIOGRAM REPORT   Patient Name:   ILSA BONELLO Date of Exam: 09/15/2023 Medical Rec #:  [id number]      Height:       61.0 in Accession #:    [id number]     Weight:       195.0 lb Date of Birth:  05-10-1950      BSA:          1.869 m Patient Age:    73 years       BP:           139/96 mmHg Patient Gender: F              HR:           74 bpm. Exam Location:  Inpatient Procedure: 2D Echo (Both Spectral and Color Flow Doppler were utilized during            procedure). Indications:    stroke  History:        Patient has prior history of Echocardiogram examinations, most                 recent 03/06/2014. COPD; Risk Factors:Hypertension, Sleep Apnea                 and Diabetes.  Sonographer:    Tinnie Barefoot RDCS Referring Phys: [id number] [facility] IMPRESSIONS  1. Left ventricular ejection fraction, by estimation, is 70 to 75%. The left ventricle has hyperdynamic function. The left ventricle has no regional wall motion abnormalities. Left ventricular diastolic parameters are consistent with Grade I diastolic dysfunction (impaired relaxation).  2. Right ventricular systolic function is normal.  The right ventricular size is normal. Tricuspid regurgitation signal is inadequate for assessing PA pressure.  3. The mitral valve is grossly normal. No evidence of mitral valve regurgitation. No evidence of mitral stenosis.  4. The aortic valve has an indeterminant number of cusps. Aortic valve regurgitation is not visualized. No aortic stenosis is present.  5. The inferior vena cava is normal in size with greater than 50% respiratory variability, suggesting right atrial pressure of 3 mmHg.  6. Agitated saline contrast bubble study was negative, with no evidence of any interatrial shunt. Comparison(s): No prior Echocardiogram. FINDINGS  Left Ventricle: Left  ventricular ejection fraction, by estimation, is 70 to 75%. The left ventricle has hyperdynamic function. The left ventricle has no regional wall motion abnormalities. Strain was performed and the global longitudinal strain is indeterminate. The left ventricular internal cavity size was normal in size. There is no left ventricular hypertrophy. Left ventricular diastolic parameters are consistent with Grade I diastolic dysfunction (impaired relaxation). Normal left ventricular filling pressure. Right Ventricle: The right ventricular size is normal. No increase in right ventricular wall thickness. Right ventricular systolic function is normal. Tricuspid regurgitation signal is inadequate for assessing PA pressure. Left Atrium: Left atrial size was normal in size. Right Atrium: Right atrial size was normal in size. Pericardium: There is no evidence of pericardial effusion. Mitral Valve: The mitral valve is grossly normal. No evidence of mitral valve regurgitation. No evidence of mitral valve stenosis. Tricuspid Valve: The tricuspid valve is not well visualized. Tricuspid valve regurgitation is not demonstrated. No evidence of tricuspid stenosis. Aortic Valve: The aortic valve has an indeterminant number of cusps. Aortic valve regurgitation is not visualized. No aortic stenosis is present. Pulmonic Valve: The pulmonic valve was not well visualized. Pulmonic valve regurgitation is not visualized. No evidence of pulmonic stenosis. Aorta: The aortic root and ascending aorta are structurally normal, with no evidence of dilitation. Venous: The inferior vena cava is normal in size with greater than 50% respiratory variability, suggesting right atrial pressure of 3 mmHg. IAS/Shunts: No atrial level shunt detected by color flow Doppler. Agitated saline contrast bubble study was negative, with no evidence of any interatrial shunt. Additional Comments: 3D was performed not requiring image post processing on an independent  workstation and was normal.  LEFT VENTRICLE PLAX 2D LVIDd:         4.40 cm   Diastology LVIDs:         3.20 cm   LV e' medial:    5.22 cm/s LV PW:         1.00 cm   LV E/e' medial:  9.6 LV IVS:        1.10 cm   LV e' lateral:   8.59 cm/s LVOT diam:     1.80 cm   LV E/e' lateral: 5.8 LV SV:         51 LV SV Index:   28 LVOT Area:     2.54 cm  RIGHT VENTRICLE             IVC RV Basal diam:  2.50 cm     IVC diam: 1.20 cm RV S prime:     11.70 cm/s TAPSE (M-mode): 1.8 cm LEFT ATRIUM           Index        RIGHT ATRIUM           Index LA diam:      3.60 cm 1.93 cm/m   RA Area:  12.30 cm LA Vol (A4C): 35.2 ml 18.84 ml/m  RA Volume:   26.60 ml  14.24 ml/m  AORTIC VALVE LVOT Vmax:   94.30 cm/s LVOT Vmean:  62.600 cm/s LVOT VTI:    0.202 m  AORTA Ao Root diam: 3.10 cm Ao Asc diam:  3.20 cm MV E velocity: 50.10 cm/s MV A velocity: 98.10 cm/s  SHUNTS MV E/A ratio:  0.51        Systemic VTI:  0.20 m                            Systemic Diam: 1.80 cm Vishnu Priya Mallipeddi Electronically signed by Diannah Late Mallipeddi Signature Date/Time: 09/15/2023/2:17:45 PM    Final    CT ANGIO HEAD NECK W WO CM Result Date: 09/14/2023 CLINICAL DATA:  Stroke/TIA, determine embolic source.  Facial droop. EXAM: CT ANGIOGRAPHY HEAD AND NECK WITH AND WITHOUT CONTRAST TECHNIQUE: Multidetector CT imaging of the head and neck was performed using the standard protocol during bolus administration of intravenous contrast. Multiplanar CT image reconstructions and MIPs were obtained to evaluate the vascular anatomy. Carotid stenosis measurements (when applicable) are obtained utilizing NASCET criteria, using the distal internal carotid diameter as the denominator. RADIATION DOSE REDUCTION: This exam was performed according to the departmental dose-optimization program which includes automated exposure control, adjustment of the mA and/or kV according to patient size and/or use of iterative reconstruction technique. CONTRAST:  75mL OMNIPAQUE   IOHEXOL  350 MG/ML SOLN COMPARISON:  Head MRI 09/14/2023 FINDINGS: CT HEAD FINDINGS Brain: No acute large territory cortical infarct, intracranial hemorrhage, mass, midline shift, or extra-axial fluid collection is identified. Patchy to confluent hypodensities in the cerebral white matter bilaterally are nonspecific but compatible with moderate to severe chronic small vessel ischemic disease. The acute lacunar infarct in the posterior right frontal white matter on MRI is not well delineated separate from chronic small vessel ischemia on CT. A chronic lacunar infarct is again noted in the left thalamus. Mild cerebral atrophy is within normal limits for age. Vascular: Calcified atherosclerosis at the skull base. Skull: No fracture or suspicious lesion. Sinuses/Orbits: Mild mucosal thickening and small mucous retention cysts in the paranasal sinuses. Clear mastoid air cells. Unremarkable orbits. Other: None. Review of the MIP images confirms the above findings CTA NECK FINDINGS Aortic arch: Standard branching with mild atherosclerosis. No significant stenosis of the arch vessel origins. Right carotid system: Patent without evidence of stenosis or dissection. Left carotid system: Patent without evidence of stenosis or dissection. Minimal mixed plaque at the carotid bifurcation. Vertebral arteries: Patent and codominant without evidence of stenosis or dissection. Skeleton: Moderate cervical spondylosis and advanced facet arthrosis. Other neck: No evidence of cervical lymphadenopathy or mass. Upper chest: No mass or consolidation in the included lung apices. Review of the MIP images confirms the above findings CTA HEAD FINDINGS Anterior circulation: The internal carotid arteries are patent from skull base to carotid termini with mild atherosclerosis not resulting in significant stenosis. ACAs and MCAs are patent without evidence of a proximal branch occlusion or significant proximal stenosis. No aneurysm is identified.  Posterior circulation: The intracranial vertebral arteries are widely patent to the basilar. Patent right PICA, bilateral AICA, and bilateral SCA origins are visualized. The basilar artery is widely patent. There are large left and diminutive or absent right posterior communicating arteries with hypoplasia of the left P1 segment. Both PCAs are patent without evidence of a significant proximal stenosis. No aneurysm is identified. Venous  sinuses: As permitted by contrast timing, patent. Anatomic variants: Fetal left PCA. Review of the MIP images confirms the above findings IMPRESSION: 1. Mild atherosclerosis in the head and neck without a large vessel occlusion or significant proximal stenosis. 2. Moderate to severe chronic small vessel ischemic disease. 3.  Aortic Atherosclerosis (ICD10-I70.0). Electronically Signed   By: Dasie Hamburg M.D.   On: 09/14/2023 15:59   MR BRAIN WO CONTRAST Result Date: 09/14/2023 EXAM: MRI BRAIN WITHOUT CONTRAST 09/14/2023 11:21:00 AM TECHNIQUE: Multiplanar multisequence MRI of the head/brain was performed without the administration of intravenous contrast. COMPARISON: 04/05/2017 CLINICAL HISTORY: Neuro deficit, acute, stroke suspected. Left lower facial droop. FINDINGS: BRAIN AND VENTRICLES: There is an acute lacunar infarct within the centrum semiovale of the right posterior frontal lobe. There is age-related atrophy and moderately advanced periventricular and deep cerebral white matter disease. There is a chronic lacunar infarct within the left posterior thalamus. No intracranial hemorrhage. No mass. No midline shift. No hydrocephalus. The sella is unremarkable. Normal flow voids. ORBITS: No acute abnormality. SINUSES AND MASTOIDS: There is polypoid mucosal disease present within the maxillary sinuses bilaterally. BONES AND SOFT TISSUES: Normal marrow signal. No acute soft tissue abnormality. IMPRESSION: 1. Acute lacunar infarct within the centrum semiovale of the right posterior  frontal lobe. 2. Chronic lacunar infarct within the left posterior thalamus. 3. Age-related atrophy and moderately advanced periventricular and deep cerebral white matter disease. Electronically signed by: Evalene Coho MD 09/14/2023 11:30 AM EDT RP Workstation: [id number]    Micro Results    No results found for this or any previous visit (from the past 240 hours).  Today   Subjective    Toni Hicks today has no headache,no chest abdominal pain,no new weakness tingling or numbness, feels much better wants to go home today.    Objective   Blood pressure 124/62, pulse 70, temperature 98.2 F (36.8 C), temperature source Axillary, resp. rate 18, height 5' 1 (1.549 m), weight 88.5 kg, SpO2 92%.   Intake/Output Summary (Last 24 hours) at 09/16/2023 0725 Last data filed at 09/16/2023 0600 Gross per 24 hour  Intake 620 ml  Output --  Net 620 ml    Exam  Awake Alert, No new F.N deficits,    [place].AT,PERRAL Supple Neck,   Symmetrical Chest wall movement, Good air movement bilaterally, CTAB RRR,No Gallops,   +ve B.Sounds, Abd Soft, Non tender,  No Cyanosis, Clubbing or edema    Data Review   Recent Labs  Lab 09/14/23 0754 09/14/23 0812  WBC 7.2  --   HGB 15.1* 15.3*  HCT 45.8 45.0  PLT 265  --   MCV 87.4  --   MCH 28.8  --   MCHC 33.0  --   RDW 13.4  --   LYMPHSABS 1.9  --   MONOABS 0.8  --   EOSABS 0.2  --   BASOSABS 0.1  --     Recent Labs  Lab 09/14/23 0754 09/14/23 0812 09/14/23 1522  NA 140 142  --   K 3.6 3.7  --   CL 105 105  --   CO2 22  --   --   ANIONGAP 13  --   --   GLUCOSE 146* 147*  --   BUN 8 9  --   CREATININE 0.73 0.60  --   AST 43*  --   --   ALT 39  --   --   ALKPHOS 84  --   --   BILITOT 0.5  --   --  ALBUMIN 4.1  --   --   INR 0.9  --   --   HGBA1C  --   --  6.4*  CALCIUM  9.1  --   --    Lab Results  Component Value Date   CHOL 161 09/14/2023   HDL 55 09/14/2023   LDLCALC 90 09/14/2023   TRIG 78 09/14/2023   CHOLHDL 2.9  09/14/2023    Total Time in preparing paper work, data evaluation and todays exam - 35 minutes  Signature  -    Lavada Stank M.D on 09/16/2023 at 7:25 AM   -  To page go to [email or web address]

## 2023-09-17 ENCOUNTER — Other Ambulatory Visit: Payer: Self-pay

## 2023-09-17 ENCOUNTER — Telehealth: Payer: Self-pay

## 2023-09-17 NOTE — Transitions of Care (Post Inpatient/ED Visit) (Signed)
 09/17/2023  Name: Toni Hicks MRN: [id number] DOB: [birth year]  Today's TOC FU Call Status: Today's TOC FU Call Status:: Successful TOC FU Call Completed TOC FU Call Complete Date: 09/17/23 Patient's Name and Date of Birth confirmed.  Transition Care Management Follow-up Telephone Call Date of Discharge: 09/16/23 Discharge Facility: [facility]) Type of Discharge: Inpatient Admission Primary Inpatient Discharge Diagnosis:: cerebral infarction How have you been since you were released from the hospital?: Better Any questions or concerns?: No  Items Reviewed: Did you receive and understand the discharge instructions provided?: Yes Medications obtained,verified, and reconciled?: Yes (Medications Reviewed) Any new allergies since your discharge?: No Dietary orders reviewed?: Yes Do you have support at home?: Yes People in Home [RPT]: significant other  Medications Reviewed Today: Medications Reviewed Today     Reviewed by Emmitt Pan, LPN (Licensed Practical Nurse) on 09/17/23 at 1335  Med List Status: <None>   Medication Order Taking? Sig Documenting Provider Last Dose Status Informant  albuterol  (VENTOLIN  HFA) 108 (90 Base) MCG/ACT inhaler [id number] Yes INHALE 1-2 PUFFS BY MOUTH EVERY 6 HOURS AS NEEDED FOR WHEEZE OR SHORTNESS OF BREATH Mannam, Praveen, MD  Active Self, Pharmacy Records  amLODipine  (NORVASC ) 5 MG tablet [id number]  Take 1 tablet (5 mg total) by mouth daily.  Patient not taking: Reported on 09/17/2023   Singh, Prashant K, MD  Active   aspirin  EC 81 MG tablet [id number] Yes Take 1 tablet (81 mg total) by mouth daily. Swallow whole. Singh, Prashant K, MD  Active   b complex vitamins tablet [id number] Yes Take 1 tablet by mouth 3 (three) times a week. [provider]  Active Self, Pharmacy Records           Med Note Toni Hicks   Sat Sep 14, 2023  1:04 PM) Patient take 1 tablet daily  clopidogrel  (PLAVIX ) 75 MG tablet [id number] Yes Take 1 tablet (75  mg total) by mouth daily for 20 days. Singh, Prashant K, MD  Active   Dupilumab  (DUPIXENT ) 300 MG/2ML EMMANUEL [id number] Yes Inject 300 mg into the skin every 14 (fourteen) days. Neysa Reggy BIRCH, MD  Active Self, Pharmacy Records           Med Note Toni JESUSA C   Sat Sep 14, 2023  1:03 PM) Dose is due today 9.6.2025  EPINEPHrine  0.3 mg/0.3 mL IJ SOAJ injection [id number] Yes Inject 0.3 mg into the muscle as needed for anaphylaxis. Neysa Reggy BIRCH, MD  Active Self, Pharmacy Records  hydrocortisone  2.5 % ointment [id number] Yes Apply topically 2 (two) times daily. Jeneal Danita Macintosh, MD  Active Self, Pharmacy Records  melatonin 5 MG TABS [id number] Yes Take 1 tablet (5 mg total) by mouth at bedtime. Singh, Prashant K, MD  Active   montelukast  (SINGULAIR ) 10 MG tablet [id number] Yes TAKE 1 TABLET BY MOUTH EVERYDAY AT BEDTIME Neysa Reggy BIRCH, MD  Active Self, Pharmacy Records  rosuvastatin  (CRESTOR ) 10 MG tablet [id number] Yes Take 1 tablet (10 mg total) by mouth daily. Singh, Prashant K, MD  Active   traZODone  (DESYREL ) 100 MG tablet [id number] Yes Take 1 tablet (100 mg total) by mouth at bedtime. Dennise Lavada POUR, MD  Active   TRELEGY ELLIPTA  100-62.5-25 MCG/ACT AEPB [id number] Yes INHALE 1 PUFF THEN RINSE MOUTH, ONCE DAILY Young, Clinton D, MD  Active Self, Pharmacy Records  Vitamin D , Ergocalciferol , (DRISDOL ) 1.25 MG (50000 UNIT) CAPS capsule [id number] Yes Take 1 capsule (50,000 Units total) by mouth every 7 (seven) days. Claudene,  Arthea HERO, DO  Active Self, Pharmacy Records           Med Note Toni Hicks   Dju Sep 14, 2023  1:09 PM) Sundays            Home Care and Equipment/Supplies: Were Home Health Services Ordered?: Yes Name of Home Health Agency:: unknown Has Agency set up a time to come to your home?: No Any new equipment or medical supplies ordered?: NA  Functional Questionnaire: Do you need assistance with bathing/showering or dressing?: No Do you need assistance with  meal preparation?: Yes Do you need assistance with eating?: No Do you have difficulty maintaining continence: No Do you need assistance with getting out of bed/getting out of a chair/moving?: No Do you have difficulty managing or taking your medications?: No  Follow up appointments reviewed: PCP Follow-up appointment confirmed?: Yes Date of PCP follow-up appointment?: 09/24/23 Follow-up Provider: [facility] Follow-up appointment confirmed?: No Reason Specialist Follow-Up Not Confirmed: Patient has Specialist Provider Number and will Call for Appointment Do you need transportation to your follow-up appointment?: No Do you understand care options if your condition(s) worsen?: Yes-patient verbalized understanding    SIGNATURE Julian Lemmings, LPN [facility] Nurse Health Advisor Direct Dial [phone]

## 2023-09-18 ENCOUNTER — Other Ambulatory Visit: Payer: Self-pay

## 2023-09-19 ENCOUNTER — Other Ambulatory Visit

## 2023-09-24 ENCOUNTER — Encounter: Payer: Self-pay | Admitting: Internal Medicine

## 2023-09-24 ENCOUNTER — Ambulatory Visit: Admitting: Internal Medicine

## 2023-09-24 VITALS — BP 136/82 | HR 79 | Temp 98.2°F | Ht 61.0 in | Wt 198.0 lb

## 2023-09-24 DIAGNOSIS — E785 Hyperlipidemia, unspecified: Secondary | ICD-10-CM

## 2023-09-24 DIAGNOSIS — E118 Type 2 diabetes mellitus with unspecified complications: Secondary | ICD-10-CM

## 2023-09-24 DIAGNOSIS — Z8673 Personal history of transient ischemic attack (TIA), and cerebral infarction without residual deficits: Secondary | ICD-10-CM

## 2023-09-24 DIAGNOSIS — G4700 Insomnia, unspecified: Secondary | ICD-10-CM

## 2023-09-24 DIAGNOSIS — E1169 Type 2 diabetes mellitus with other specified complication: Secondary | ICD-10-CM

## 2023-09-24 DIAGNOSIS — I10 Essential (primary) hypertension: Secondary | ICD-10-CM | POA: Diagnosis not present

## 2023-09-24 MED ORDER — ROSUVASTATIN CALCIUM 10 MG PO TABS: 10.0000 mg | ORAL_TABLET | Freq: Every day | ORAL | 3 refills | Status: AC

## 2023-09-24 MED ORDER — ASPIRIN 81 MG PO TBEC: 81.0000 mg | DELAYED_RELEASE_TABLET | Freq: Every day | ORAL | 3 refills | Status: AC

## 2023-09-24 MED ORDER — BLOOD GLUCOSE TEST STRIPS 333 VI STRP: ORAL_STRIP | 11 refills | Status: AC

## 2023-09-24 MED ORDER — DULOXETINE HCL 20 MG PO CPEP: 20.0000 mg | ORAL_CAPSULE | Freq: Every day | ORAL | 3 refills | Status: DC

## 2023-09-24 MED ORDER — TRAZODONE HCL 100 MG PO TABS: 100.0000 mg | ORAL_TABLET | Freq: Every day | ORAL | 3 refills | Status: AC

## 2023-09-24 NOTE — Progress Notes (Signed)
 Subjective:   Patient ID: Toni Hicks, female    DOB: [birth year], 73 y.o.   MRN: [id number]  Discussed the use of AI scribe software for clinical note transcription with the patient, who gave verbal consent to proceed.  History of Present Illness Toni Hicks is a 73 year old female with a history of stroke who presents with speech difficulties and facial asymmetry.  She experienced a recent stroke, leading to hospitalization for a couple of days. Since then, she has ongoing issues with speech and facial asymmetry, describing her mouth as not working properly and her eyes appearing as if 'somebody hit me.' She has difficulty with word recall, such as confusing 'ibuprofen' with 'albuterol ,' and experiences 'blank' moments. Her memory is not as sharp as it used to be.  She has a history of small vessel disease and had a previous stroke about ten years ago. Since her first stroke, she has significantly reduced her alcohol intake, cutting it in half twice. Her family is concerned about her alcohol consumption, but she finds it helps with her anxiety.  She struggles with sleep issues, previously not sleeping well without sleeping pills. She attributes her stroke to lack of sleep, as she was only getting three to four hours of fragmented sleep. Recently, she has been able to sleep over eight hours with the help of trazodone  and melatonin, which she finds effective. She has tried various sleep medications in the past, but they often stopped working over time.  She is more clumsy and has trouble with her eyes watering, particularly the left one. She also experiences dry mouth, which she believes is contributing to her teeth rotting. She has been using Biotene for dry mouth but finds it insufficient.  She experiences swings in her blood sugar levels, with lows down to 82 and highs up to 230. She attributes these swings to dietary choices, such as going too long without eating or consuming high-sugar  foods. She has been trying to manage her blood sugar by eating berries, nuts, and yogurt without sugar, and has been using protein supplements to help stabilize her levels.  Her current medications include trazodone , melatonin, duloxetine , and meloxicam . She is currently on Plavix  and aspirin  following her stroke and is also on rosuvastatin  (Crestor ) for cholesterol management and small vessel disease prevention. Her blood pressure has been stable with amlodipine , with recent readings around 112-118/70.  Review of Systems  Constitutional: Negative.   HENT: Negative.    Eyes: Negative.   Respiratory:  Negative for cough, chest tightness and shortness of breath.   Cardiovascular:  Negative for chest pain, palpitations and leg swelling.  Gastrointestinal:  Negative for abdominal distention, abdominal pain, constipation, diarrhea, nausea and vomiting.  Musculoskeletal: Negative.   Skin: Negative.   Neurological:  Positive for speech difficulty and weakness.  Psychiatric/Behavioral: Negative.      Objective:  Physical Exam Constitutional:      Appearance: She is well-developed.  HENT:     Head: Normocephalic and atraumatic.  Cardiovascular:     Rate and Rhythm: Normal rate and regular rhythm.  Pulmonary:     Effort: Pulmonary effort is normal. No respiratory distress.     Breath sounds: Normal breath sounds. No wheezing or rales.  Abdominal:     General: Bowel sounds are normal. There is no distension.     Palpations: Abdomen is soft.     Tenderness: There is no abdominal tenderness.  Musculoskeletal:     Cervical back: Normal range  of motion.  Skin:    General: Skin is warm and dry.  Neurological:     Mental Status: She is alert and oriented to person, place, and time.     Sensory: Sensory deficit present.     Motor: Weakness present.     Coordination: Coordination normal.     Comments: Some speech fluidity issues during visit, minimal word finding difficulty, decreased movement  left face     Vitals:   09/24/23 1608  BP: 136/82  Pulse: 79  Temp: 98.2 F (36.8 C)  TempSrc: Oral  SpO2: 95%  Weight: 198 lb (89.8 kg)  Height: 5' 1 (1.549 m)    Assessment and Plan Assessment & Plan Hx  stroke with left facial and speech deficits   She recently experienced a stroke with left facial droop, speech difficulties, and clumsiness. MRI confirmed the stroke and small vessel disease. Recovery is expected over 6-12 months. Refer her to an outpatient neuro rehab program for speech therapy and physical therapy assessment. Continue Plavix  and aspirin  for 3-4 weeks, then discontinue Plavix  and continue aspirin .  Cerebral small vessel disease   Chronic small vessel disease of moderate severity is unchanged since 2019, as shown on MRI. Rosuvastatin  is recommended to prevent progression and reduce stroke risk. Continue rosuvastatin  long-term.  Type 2 diabetes mellitus  She has labile glycemic control with recent blood sugar swings. Hemoglobin A1c is 6.4%, within the target range for post-stroke management. Encourage dietary modifications to stabilize blood sugar, including reducing sugar intake and increasing protein, fiber, and healthy fats.  Hypertension   Her blood pressure is well-controlled with amlodipine , with recent readings within the normal range. Continue amlodipine  for blood pressure control.  Hyperlipidemia  associated with dm Hyperlipidemia is managed with rosuvastatin . LDL cholesterol is 90 mg/dL, with a target of under 70 mg/dL post-stroke. Continue rosuvastatin  to manage hyperlipidemia and reduce stroke risk.  Insomnia   Insomnia is managed with trazodone  and melatonin, with recent improvement in sleep duration and quality. Continue trazodone  and melatonin for sleep as needed.

## 2023-09-24 NOTE — Assessment & Plan Note (Signed)
 She has labile glycemic control with recent blood sugar swings. Hemoglobin A1c is 6.4%, within the target range for post-stroke management. Encourage dietary modifications to stabilize blood sugar, including reducing sugar intake and increasing protein, fiber, and healthy fats. Rx test strips

## 2023-09-24 NOTE — Patient Instructions (Addendum)
 You can try lemon drops in the mouth to help with saliva.  We will get you in with the rehab to help with the speech and face.

## 2023-09-24 NOTE — Assessment & Plan Note (Signed)
 Insomnia is managed with trazodone  and melatonin, with recent improvement in sleep duration and quality. Continue trazodone  and melatonin for sleep as needed.

## 2023-09-24 NOTE — Assessment & Plan Note (Signed)
 Hyperlipidemia is managed with rosuvastatin . LDL cholesterol is 90 mg/dL, with a target of under 70 mg/dL post-stroke. Continue rosuvastatin  to manage hyperlipidemia and reduce stroke risk.

## 2023-09-24 NOTE — Assessment & Plan Note (Signed)
 She recently experienced a stroke with left facial droop, speech difficulties, and clumsiness. MRI confirmed the stroke and small vessel disease. Recovery is expected over 6-12 months. Refer her to an outpatient neuro rehab program for speech therapy and physical therapy assessment. Continue Plavix  and aspirin  for 3-4 weeks, then discontinue Plavix  and continue aspirin .

## 2023-09-24 NOTE — Assessment & Plan Note (Signed)
 Her blood pressure is well-controlled with amlodipine , with recent readings within the normal range. Continue amlodipine  for blood pressure control.

## 2023-09-30 ENCOUNTER — Telehealth: Payer: Self-pay | Admitting: Internal Medicine

## 2023-09-30 NOTE — Telephone Encounter (Unsigned)
 Copied from CRM [id number]. Topic: Clinical - Medication Refill >> Sep 30, 2023  3:40 PM Mercedes MATSU wrote: Medication: One Touch Verio Test Strips   Has the patient contacted their pharmacy? Yes (Agent: If no, request that the patient contact the pharmacy for the refill. If patient does not wish to contact the pharmacy document the reason why and proceed with request.) (Agent: If yes, when and what did the pharmacy advise?)  This is the patient's preferred pharmacy:  [id number] [place], [place] - [address] [place] [address] Phone: [phone] Fax: [phone]   Is this the correct pharmacy for this prescription? Yes If no, delete pharmacy and type the correct one.   Has the prescription been filled recently? Yes  Is the patient out of the medication? Yes  Has the patient been seen for an appointment in the last year OR does the patient have an upcoming appointment? Yes  Can we respond through MyChart? Yes  Agent: Please be advised that Rx refills may take up to 3 business days. We ask that you follow-up with your pharmacy.

## 2023-10-02 ENCOUNTER — Other Ambulatory Visit: Payer: Self-pay

## 2023-10-02 NOTE — Telephone Encounter (Signed)
 Please send in as this is not showing up on my end

## 2023-10-02 NOTE — Progress Notes (Signed)
 Specialty Pharmacy Refill Coordination Note  Toni Hicks is a 73 y.o. female contacted today regarding refills of specialty medication(s) Dupilumab  (Dupixent )   Patient requested Delivery   Delivery date: 10/04/23   Verified address: [address]  [place]   Medication will be filled on 10/03/23.

## 2023-10-04 MED ORDER — LANCETS MISC. MISC
1.0000 | Freq: Three times a day (TID) | 0 refills | Status: AC
Start: 2023-10-04 — End: 2023-11-03

## 2023-10-04 MED ORDER — LANCET DEVICE MISC: 1.0000 | Freq: Three times a day (TID) | 0 refills | Status: AC

## 2023-10-04 MED ORDER — BLOOD GLUCOSE MONITORING SUPPL DEVI: 1.0000 | Freq: Three times a day (TID) | 0 refills | Status: AC

## 2023-10-04 MED ORDER — BLOOD GLUCOSE TEST VI STRP
1.0000 | ORAL_STRIP | Freq: Three times a day (TID) | 0 refills | Status: AC
Start: 2023-10-04 — End: 2023-11-03

## 2023-10-04 NOTE — Telephone Encounter (Signed)
 Ok to send generic glucose test strips with note to sub based on patient/insurance preference

## 2023-10-04 NOTE — Telephone Encounter (Signed)
 This has been sent in

## 2023-10-04 NOTE — Addendum Note (Signed)
 Addended by: ROSALVA LEX RAMAN on: 10/04/2023 04:22 PM   Modules accepted: Orders

## 2023-10-28 ENCOUNTER — Other Ambulatory Visit: Payer: Self-pay | Admitting: Internal Medicine

## 2023-10-28 ENCOUNTER — Other Ambulatory Visit: Payer: Self-pay

## 2023-10-28 ENCOUNTER — Other Ambulatory Visit (HOSPITAL_COMMUNITY): Payer: Self-pay

## 2023-10-28 DIAGNOSIS — J4540 Moderate persistent asthma, uncomplicated: Secondary | ICD-10-CM

## 2023-10-28 MED ORDER — DUPIXENT 300 MG/2ML ~~LOC~~ SOAJ
300.0000 mg | SUBCUTANEOUS | 0 refills | Status: DC
  Filled 2023-10-28 – 2023-10-29 (×2): qty 4, 28d supply, fill #0

## 2023-10-28 NOTE — Telephone Encounter (Signed)
 Refill sent for DUPIXENT  to [facility]: [phone]   Dose: 300mg  [place] every 14 days   Last OV: 11/20/22 Provider: Dr. Neysa  Next OV: overdue -   Routing to scheduling team for follow-up on appt scheduling  Aleck Puls, PharmD, BCPS Clinical Pharmacist  [facility]

## 2023-10-29 ENCOUNTER — Other Ambulatory Visit: Payer: Self-pay

## 2023-10-29 ENCOUNTER — Other Ambulatory Visit (HOSPITAL_COMMUNITY): Payer: Self-pay

## 2023-10-29 NOTE — Telephone Encounter (Signed)
 ATC 1x sent LTR left VM

## 2023-10-29 NOTE — Progress Notes (Signed)
 Specialty Pharmacy Refill Coordination Note  Toni Hicks is a 73 y.o. female contacted today regarding refills of specialty medication(s) Dupilumab  (Dupixent )   Patient requested Delivery   Delivery date: 11/01/23   Verified address: [address]  [place]   Medication will be filled on 10/31/23.

## 2023-10-30 ENCOUNTER — Other Ambulatory Visit: Payer: Self-pay

## 2023-10-30 NOTE — Telephone Encounter (Signed)
 Patient scheduled 12/11 w/ Young.

## 2023-11-21 ENCOUNTER — Other Ambulatory Visit: Payer: Self-pay

## 2023-11-21 ENCOUNTER — Other Ambulatory Visit: Payer: Self-pay | Admitting: Family Medicine

## 2023-11-21 ENCOUNTER — Other Ambulatory Visit: Payer: Self-pay | Admitting: Internal Medicine

## 2023-11-21 DIAGNOSIS — J4540 Moderate persistent asthma, uncomplicated: Secondary | ICD-10-CM

## 2023-11-21 NOTE — Telephone Encounter (Signed)
 Pt requesting refill of specialty medication - routing to Rx team to advise.

## 2023-11-22 ENCOUNTER — Other Ambulatory Visit: Payer: Self-pay

## 2023-11-22 MED ORDER — DUPIXENT 300 MG/2ML ~~LOC~~ SOAJ
300.0000 mg | SUBCUTANEOUS | 0 refills | Status: DC
  Filled 2023-11-22 (×2): qty 4, 28d supply, fill #0

## 2023-11-22 NOTE — Telephone Encounter (Signed)
 Refill sent for DUPIXENT  to [facility]: [phone]   Dose: 300mg  [place] every 14 days   Last OV: 11/20/22 Provider: Dr. Neysa  Next OV: 12/19/23  Aleck Puls, PharmD, BCPS Clinical Pharmacist  [facility]

## 2023-11-26 ENCOUNTER — Other Ambulatory Visit (HOSPITAL_COMMUNITY): Payer: Self-pay

## 2023-11-26 ENCOUNTER — Other Ambulatory Visit: Payer: Self-pay

## 2023-11-26 NOTE — Progress Notes (Signed)
 Specialty Pharmacy Refill Coordination Note  TOBIN CADIENTE is a 73 y.o. female contacted today regarding refills of specialty medication(s) Dupilumab  (Dupixent )   Patient requested Delivery   Delivery date: 11/29/23   Verified address: [address]  [place]   Medication will be filled on: 11/28/23

## 2023-11-27 ENCOUNTER — Other Ambulatory Visit: Payer: Self-pay

## 2023-11-30 ENCOUNTER — Other Ambulatory Visit: Payer: Self-pay | Admitting: Internal Medicine

## 2023-12-02 NOTE — Telephone Encounter (Signed)
 Courtesy refill, pt needs an appt.

## 2023-12-09 DIAGNOSIS — H2513 Age-related nuclear cataract, bilateral: Secondary | ICD-10-CM | POA: Diagnosis not present

## 2023-12-09 DIAGNOSIS — E119 Type 2 diabetes mellitus without complications: Secondary | ICD-10-CM | POA: Diagnosis not present

## 2023-12-09 DIAGNOSIS — H5213 Myopia, bilateral: Secondary | ICD-10-CM | POA: Diagnosis not present

## 2023-12-09 LAB — OPHTHALMOLOGY REPORT-SCANNED

## 2023-12-12 ENCOUNTER — Other Ambulatory Visit (HOSPITAL_COMMUNITY): Payer: Self-pay

## 2023-12-18 ENCOUNTER — Other Ambulatory Visit: Payer: Self-pay | Admitting: Internal Medicine

## 2023-12-18 ENCOUNTER — Other Ambulatory Visit: Payer: Self-pay

## 2023-12-18 DIAGNOSIS — J4540 Moderate persistent asthma, uncomplicated: Secondary | ICD-10-CM

## 2023-12-18 NOTE — Progress Notes (Signed)
 HPI F former smoker followed for OSA, complicated by Restless Legs, ETOH abuse, ETOH induced insomnia, Allergic rhinitis/ conjunctivitis, Nasal polyps, anxiety, depression, HBP, CVA, Hypothyroid, Asthma, Urticaria, DM NPSG Dohmeier 04/08/2017-  AHI 11.25/ hr, desaturation to 53%, 174 lbs Office Spirometry 03/20/2018 (Allergy and Asthma) Moderate obstruction, with FVC 1.84/ 70%, FEV1 1.09/ 54%, ratio 0.59/ 77%, FEF25-75% 0.44/ 24%. 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, Moxifloxacin Lab- IgE H 326 09/16/19 ---------------------------------------------------------------------------------------------------   10/17/21-  71 yoF former smoker followed for COPD, Lung Nodules, hx Recurrent Pneumonias, Atypical AFB,  complicated by OSA ( Dr Dohmeier/ [facility]), Restless Legs, ETOH abuse, ETOH induced insomnia, Allergic rhinitis/ conjunctivitis(Dr Padgett), Nasal polyps, anxiety, depression, HBP, CVA, Nasal Septal Perforation,  Hypothyroid,  Urticaria, DM2, Covid infection 2022  Covid vax- 4 Phizer, 1 Moderna, Flu vax-   declines (CPAP 9/ [facility]- followed by [facility]/ Dr Gailen) 100%, AHI 2.3/ hr -Ventolin  hfa, Singulair , Trelegy 100, Neb albuterol , Dupixent , ACT score 20 -----Pt has been stopped up some due to allergies but is doing okay  Very pleased with her asthma control on Trelegy plus Dupixent . Still moderate seasonal flare of rhinitis with drainage. Flonase  had caused mood change. Discussed cromolyn and azelastine  options. 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 CT chest 05/26/21-  IMPRESSION: 1. Multiple small bilateral pulmonary nodules are stable and definitively benign. No further follow-up or characterization is required for these nodules. 2. Minimal emphysema. Aortic Atherosclerosis (ICD10-I70.0) and Emphysema (ICD10-J43.9).   12/19/23- 3 yoF former  smoker followed for COPD, Lung Nodules, Insomnia, hx Recurrent Pneumonias, Atypical AFB,  complicated by OSA ( Dr Dohmeier/ [facility]), Restless Legs, ETOH abuse, ETOH induced insomnia, Allergic rhinitis/ conjunctivitis(Dr Padgett), Nasal polyps, anxiety, depression, HBP, CVA, Nasal Septal Perforation,  Hypothyroid,  Urticaria, DM2, Covid infection 2022  (CPAP 9/ [facility]- followed by [facility]/ Dr Gailen) 100%, AHI 2.3/ hr -Ventolin  hfa, Singulair , Trelegy 100, Neb albuterol , Dupixent , HOSP 9/6-09/16/23- CVA w L facial droop. -----Patient states doing well.  No bronchitis issues Very pleased with her status now and considers her breathing to be much better on c current therapy with Trelegy and Dupixent . Dr Chalice manages OSA. Does complain of insomnia- chronic and primary. Trazodone  and hydralazine had helped for a few days each in the past. Discussed seep hygiene and trial of Lunesta.  CTchest 05/28/21 IMPRESSION: 1. Multiple small bilateral pulmonary nodules are stable and definitively benign. No further follow-up or characterization is required for these nodules. 2. Minimal emphysema. Aortic Atherosclerosis (ICD10-I70.0) and Emphysema (ICD10-J43.9).  ROS-see HPI   + = positive Constitutional:    weight loss, night sweats, fevers, chills, fatigue, lassitude. HEENT:    headaches, difficulty swallowing, tooth/dental problems, sore throat,       sneezing, itching, ear ache, +nasal congestion, post nasal drip, snoring CV:    chest pain, orthopnea, PND, swelling in lower extremities, anasarca,                                   dizziness, +palpitations Resp:   +shortness of breath with exertion or at rest.                +productive cough,   non-productive cough, coughing up of blood.              change in color of mucus.  wheezing.   Skin:    +rash or lesions. GI:    heartburn, +indigestion, abdominal pain, nausea, vomiting, diarrhea,                 change in bowel habits, loss of appetite GU:  dysuria, change in color of urine, no urgency or frequency.   flank pain. MS:   joint pain, +stiffness, decreased range of motion, back pain. Neuro-     nothing unusual Psych:  change in mood or affect.  depression or +anxiety.   memory loss.  OBJ- Physical Exam General- Alert, Oriented, Affect-appropriate, Distress- none acute, +obese Skin- + eczema antecubital, calves Lymphadenopathy- none Head- atraumatic            Eyes- Gross vision intact, PERRLA, conjunctivae and secretions clear            Ears- Hearing, canals-normal            Nose- Clear, no-Septal dev, mucus, polyps, erosion, perforation             Throat- Mallampati II , mucosa+red , +exudate L, drainage- none, tonsils- atrophic Neck- flexible , trachea midline, no stridor , thyroid  nl, carotid no bruit Chest - symmetrical excursion , unlabored           Heart/CV- RRR , no murmur , no gallop  , no rub, nl s1 s2                           - JVD- none , edema- none, stasis changes- none, varices- none           Lung- diminished/ clear, wheeze-none, cough-none, dullness-none, rub- none           Chest wall-  Abd-  Br/ Gen/ Rectal- Not done, not indicated Extrem- cyanosis- none, clubbing, none, atrophy- none, strength- nl Neuro- +minimal if any residual left facial droop

## 2023-12-19 ENCOUNTER — Ambulatory Visit: Admitting: Internal Medicine

## 2023-12-19 ENCOUNTER — Encounter: Payer: Self-pay | Admitting: Internal Medicine

## 2023-12-19 VITALS — BP 136/64 | HR 72 | Temp 98.0°F | Ht 61.0 in | Wt 201.0 lb

## 2023-12-19 DIAGNOSIS — F5104 Psychophysiologic insomnia: Secondary | ICD-10-CM | POA: Diagnosis not present

## 2023-12-19 DIAGNOSIS — J449 Chronic obstructive pulmonary disease, unspecified: Secondary | ICD-10-CM

## 2023-12-19 DIAGNOSIS — F5101 Primary insomnia: Secondary | ICD-10-CM

## 2023-12-19 MED ORDER — ESZOPICLONE 3 MG PO TABS: ORAL_TABLET | ORAL | 3 refills | Status: DC

## 2023-12-19 NOTE — Patient Instructions (Addendum)
 Continue CPAP- Dr Chalice  Script sent for Lunesta 3 mg    try 1 at bedtime for sleep as needed

## 2023-12-20 ENCOUNTER — Other Ambulatory Visit: Payer: Self-pay

## 2023-12-20 MED ORDER — DUPIXENT 300 MG/2ML ~~LOC~~ SOAJ
300.0000 mg | SUBCUTANEOUS | 3 refills | Status: DC
  Filled 2023-12-20: qty 4, 28d supply, fill #0

## 2023-12-20 NOTE — Telephone Encounter (Signed)
 Refill sent for DUPIXENT  to [facility]: [phone]   Dose: 300mg  [place] every 14 days   Last OV: 12/19/23 Provider: Dr. Neysa  Next OV: 01/30/24  Aleck Puls, PharmD, BCPS Clinical Pharmacist  [facility]

## 2023-12-23 ENCOUNTER — Other Ambulatory Visit: Payer: Self-pay

## 2023-12-25 ENCOUNTER — Other Ambulatory Visit (HOSPITAL_COMMUNITY): Payer: Self-pay

## 2023-12-30 ENCOUNTER — Other Ambulatory Visit: Payer: Self-pay | Admitting: Family Medicine

## 2023-12-30 ENCOUNTER — Other Ambulatory Visit: Payer: Self-pay | Admitting: Internal Medicine

## 2023-12-30 ENCOUNTER — Telehealth: Payer: Self-pay | Admitting: Family Medicine

## 2023-12-30 MED ORDER — MELOXICAM 15 MG PO TABS: 15.0000 mg | ORAL_TABLET | Freq: Every day | ORAL | 0 refills | Status: DC

## 2023-12-30 NOTE — Telephone Encounter (Signed)
 [facility]

## 2023-12-30 NOTE — Telephone Encounter (Signed)
 Rx filled today

## 2023-12-30 NOTE — Telephone Encounter (Signed)
 Patient called and stated that her Meloxicam  was upped to 15 mg the last time. She tried to call to get it refilled and they would not refill it and let her know she had to call Dr. Theressa office. She would like to settle it before Christmas.  Please advise.

## 2024-01-03 NOTE — Assessment & Plan Note (Signed)
 Benefits from Dupixent  and current inhalers Plan- continue current Rx

## 2024-01-03 NOTE — Assessment & Plan Note (Signed)
 Chronic problem Plan- discussed sleep hygiene and expectations.  Will try Lunesta.

## 2024-01-07 ENCOUNTER — Other Ambulatory Visit: Payer: Self-pay | Admitting: Internal Medicine

## 2024-01-07 ENCOUNTER — Telehealth: Payer: Self-pay

## 2024-01-07 DIAGNOSIS — J4540 Moderate persistent asthma, uncomplicated: Secondary | ICD-10-CM

## 2024-01-07 MED ORDER — TEMAZEPAM 15 MG PO CAPS: ORAL_CAPSULE | ORAL | 1 refills | Status: AC

## 2024-01-07 NOTE — Telephone Encounter (Signed)
 Copied from CRM [id number]. Topic: Clinical - Medication Refill >> Jan 07, 2024  3:19 PM Whitney O wrote: Medication: TRELEGY ELLIPTA  100-62.5-25 MCG/ACT AEPB montelukast  (SINGULAIR ) 10 MG tablet albuterol  (VENTOLIN  HFA) 108 (90 Base) MCG/ACT inhaler Dupilumab  (DUPIXENT ) 300 MG/2ML SOAJ   Has the patient contacted their pharmacy? No they tell me I have to contact the doctor  (Agent: If no, request that the patient contact the pharmacy for the refill. If patient does not wish to contact the pharmacy document the reason why and proceed with request.) (Agent: If yes, when and what did the pharmacy advise?)  This is the patient's preferred pharmacy:  [id number] [address], [place] - [address] [place] [address] Phone: [phone] Fax: [phone]   Is this the correct pharmacy for this prescription? Yes If no, delete pharmacy and type the correct one.   Has the prescription been filled recently? No  Is the patient out of the medication? No but will be very soon  Has the patient been seen for an appointment in the last year OR does the patient have an upcoming appointment? Yes  Can we respond through MyChart? Yes  Agent: Please be advised that Rx refills may take up to 3 business days. We ask that you follow-up with your pharmacy.

## 2024-01-07 NOTE — Telephone Encounter (Signed)
 [facility]

## 2024-01-07 NOTE — Telephone Encounter (Signed)
 Copied from CRM [id number]. Topic: Clinical - Prescription Issue >> Jan 07, 2024  3:27 PM Benton O wrote: Reason for CRM: patient says these hurt her stomach and dr young said if these doesn't work  he will call in something else . Patient would like something else instead of this medication Eszopiclone 3 MG TABS.  [id number] [place], [place] - [address] [place] [address] Phone: [phone] Fax: [phone] Hours: Not open 24 hours   Called and spoke with the pt. Pt states Eszopiclone seems like pill helped in keeping the pt asleep longer but its still taking her 3-4 hours to go to sleep. Pt states she only took pill for about 2 weeks then stopped after she noticed stomach pain. Pt states pill causes diarrhea and nauseous feeling. Pts stomach pain stopped after she stopped taking med.  Pt is requesting something different that she has not tried before.  Please advise Dr. Neysa

## 2024-01-10 MED ORDER — DUPIXENT 300 MG/2ML ~~LOC~~ SOAJ: 300.0000 mg | SUBCUTANEOUS | 3 refills | Status: DC

## 2024-01-10 MED ORDER — MONTELUKAST SODIUM 10 MG PO TABS: 10.0000 mg | ORAL_TABLET | Freq: Every day | ORAL | 3 refills | Status: AC

## 2024-01-10 MED ORDER — ALBUTEROL SULFATE HFA 108 (90 BASE) MCG/ACT IN AERS: INHALATION_SPRAY | RESPIRATORY_TRACT | 3 refills | Status: AC

## 2024-01-10 MED ORDER — TRELEGY ELLIPTA 100-62.5-25 MCG/ACT IN AEPB: INHALATION_SPRAY | RESPIRATORY_TRACT | 0 refills | Status: AC

## 2024-01-15 ENCOUNTER — Other Ambulatory Visit (HOSPITAL_COMMUNITY): Payer: Self-pay

## 2024-01-15 ENCOUNTER — Other Ambulatory Visit: Payer: Self-pay

## 2024-01-15 MED ORDER — DUPIXENT 300 MG/2ML ~~LOC~~ SOAJ
300.0000 mg | SUBCUTANEOUS | 3 refills | Status: AC
  Filled 2024-01-15 – 2024-01-29 (×2): qty 4, 28d supply, fill #0

## 2024-01-15 NOTE — Addendum Note (Signed)
 Addended by: Peri Kreft L on: 01/15/2024 01:34 PM   Modules accepted: Orders

## 2024-01-26 ENCOUNTER — Other Ambulatory Visit: Payer: Self-pay | Admitting: Family Medicine

## 2024-01-28 ENCOUNTER — Other Ambulatory Visit (HOSPITAL_COMMUNITY): Payer: Self-pay

## 2024-01-29 ENCOUNTER — Other Ambulatory Visit: Payer: Self-pay

## 2024-01-30 ENCOUNTER — Ambulatory Visit: Admitting: Adult Health

## 2024-01-30 ENCOUNTER — Encounter: Payer: Self-pay | Admitting: Adult Health

## 2024-01-30 VITALS — BP 144/86 | HR 68 | Temp 98.4°F | Ht 62.0 in | Wt 200.2 lb

## 2024-01-30 DIAGNOSIS — G4733 Obstructive sleep apnea (adult) (pediatric): Secondary | ICD-10-CM | POA: Diagnosis not present

## 2024-01-30 DIAGNOSIS — Z87891 Personal history of nicotine dependence: Secondary | ICD-10-CM | POA: Diagnosis not present

## 2024-01-30 DIAGNOSIS — I679 Cerebrovascular disease, unspecified: Secondary | ICD-10-CM

## 2024-01-30 DIAGNOSIS — J45998 Other asthma: Secondary | ICD-10-CM

## 2024-01-30 DIAGNOSIS — F5104 Psychophysiologic insomnia: Secondary | ICD-10-CM

## 2024-01-30 DIAGNOSIS — R413 Other amnesia: Secondary | ICD-10-CM

## 2024-01-30 NOTE — Patient Instructions (Addendum)
 Continue on Trelegy 1 puff daily, rinse after use  Continue on Singulair  daily  Continue on Dupixent  injection every 2 weeks  Albuterol  inhaler As needed    Continue on CPAP At bedtime  Keep up good work.  Order for new CPAP   Refer to neurology as requested.   Healthy sleep regimen as discussed   Follow up with Dr. Neda or Shlomo Seres NP in 4-6 months and As needed

## 2024-01-30 NOTE — Progress Notes (Signed)
 "   @Patient  ID: Toni Hicks, female    DOB: [birth year], 74 y.o.   MRN: [id number]  Chief Complaint  Patient presents with   Medical Management of Chronic Issues    Sleep    Referring provider: Rollene Almarie LABOR, MD  HPI: 74 year old female former smoker followed for allergic asthma and allergic rhinitis, obstructive sleep apnea Medical history significant for nasal polyps, urticaria, history of alcohol abuse, stroke     TEST/EVENTS : Reviewed 01/30/2024  NPSG [place] 04/08/2017- AHI 11.25/ hr, desaturation to 53%, 174 lbs   Office Spirometry 03/20/2018 (Allergy and Asthma) Moderate obstruction, with FVC 1.84/ 70%, FEV1 1.09/ 54%, ratio 0.59/ 77%, FEF25-75% 0.44/ 24%. 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, Moxifloxacin Lab- IgE H 326 09/16/19  01/30/2024 Follow up ; Asthma, allergies, OSA Patient returns for a follow-up visit.  She says overall asthma is doing okay.  She remains on Trelegy inhaler daily.  Takes Singulair  daily.  She is on Dupixent  injections but has run out of her prescription.  She is waiting on shipment and needs a sample of Dupixent . She denies any flare of cough or wheezing currently.  Patient remains on CPAP therapy for sleep apnea.  She uses her CPAP every single night.  Feels that she benefits from CPAP.  CPAP download shows 100% compliance.  Daily average usage at 9.5 hours.  She is on CPAP 9 cm H2O.  AHI 1.5/hour.  Patient says she has chronic insomnia has trouble going to sleep and staying asleep.  Says she only sleeps for a couple hours at a time.  Has cut back on her alcohol intake typically drinking only about 2 glasses of wine daily.  Has been on multiple sleep aids in the past including Lunesta  which she felt she was allergic to.  She has taken Restoril  and trazodone .  Currently has stopped taking because of no perceived benefit  and side effect profile.  Cymbalta  was recently increased.  Says she needs a new CPAP as hers is gotten old .  Had received a message that the motor life expectancy is running out.  Does complain today that she is having memory issues that has seemed to gotten worse after her stroke.  Is requesting a referral to neurology.   Allergies[1]  Immunization History  Administered Date(s) Administered   Moderna Covid-19 Fall Seasonal Vaccine 93yrs & older 09/14/2022   Moderna Sars-Covid-2 Vaccination 04/25/2020   PFIZER(Purple Top)SARS-COV-2 Vaccination 02/22/2019, 03/17/2019, 11/05/2019   Pfizer Covid-19 Vaccine Bivalent Booster 32yrs & up 10/25/2020   Pfizer(Comirnaty)Fall Seasonal Vaccine 12 years and older 10/02/2021, 04/04/2022   Tdap 12/15/2015   Zoster, Live 12/15/2015    Past Medical History:  Diagnosis Date   ADD (attention deficit disorder)    Adult acne    Alcohol abuse    Allergic rhinitis    Allergy From birth   Anxiety and depression    Arthritis    Asthma    Congenital pneumonia    COPD (chronic obstructive pulmonary disease) (HCC)    Eczema    Hypertension    Hypothyroidism    Impaired fasting blood sugar    Mycoplasma pneumonia    Restless leg syndrome    Seasonal allergies    Sleep apnea    Stroke (HCC)    Substance abuse (HCC)    Urticaria     Tobacco History: Tobacco Use History[2]  Counseling given: Not Answered   Outpatient Medications Prior to Visit  Medication Sig Dispense Refill   albuterol  (VENTOLIN  HFA) 108 (90 Base) MCG/ACT inhaler INHALE 1-2 PUFFS BY MOUTH EVERY 6 HOURS AS NEEDED FOR WHEEZE OR SHORTNESS OF BREATH 36 each 3   amLODipine  (NORVASC ) 5 MG tablet Take 1 tablet (5 mg total) by mouth daily.     aspirin  EC 81 MG tablet Take 1 tablet (81 mg total) by mouth daily. Swallow whole. 100 tablet 3   b complex vitamins tablet Take 1 tablet by mouth 3 (three) times a week.     DULoxetine  (CYMBALTA ) 30 MG capsule Take 30 mg by mouth daily.      Dupilumab  (DUPIXENT ) 300 MG/2ML SOAJ Inject 300 mg into the skin every 14 (fourteen) days. 4 mL 3   EPINEPHrine  0.3 mg/0.3 mL IJ SOAJ injection Inject 0.3 mg into the muscle as needed for anaphylaxis. 1 each 2   Fluticasone -Umeclidin-Vilant (TRELEGY ELLIPTA ) 100-62.5-25 MCG/ACT AEPB Inhale 1 puff then rinse mouth, once daily 60 each 0   Glucose Blood (BLOOD GLUCOSE TEST STRIPS 333) STRP Use up to QID 100 strip 11   meloxicam  (MOBIC ) 15 MG tablet TAKE 1 TABLET (15 MG TOTAL) BY MOUTH DAILY. 30 tablet 0   montelukast  (SINGULAIR ) 10 MG tablet Take 1 tablet (10 mg total) by mouth daily. 90 tablet 3   Vitamin D , Ergocalciferol , (DRISDOL ) 1.25 MG (50000 UNIT) CAPS capsule Take 1 capsule (50,000 Units total) by mouth every 7 (seven) days. 12 capsule 0   Blood Glucose Monitoring Suppl DEVI 1 each by Does not apply route in the morning, at noon, and at bedtime. May substitute to any manufacturer covered by patient's insurance. (Patient not taking: Reported on 01/30/2024) 1 each 0   hydrocortisone  2.5 % ointment Apply topically 2 (two) times daily. (Patient not taking: Reported on 01/30/2024) 60 g 5   melatonin 5 MG TABS Take 1 tablet (5 mg total) by mouth at bedtime. (Patient not taking: Reported on 01/30/2024) 10 tablet 0   rosuvastatin  (CRESTOR ) 10 MG tablet Take 1 tablet (10 mg total) by mouth daily. (Patient not taking: Reported on 01/30/2024) 90 tablet 3   temazepam  (RESTORIL ) 15 MG capsule 1 or 2 caps at bedtime for sleep as needed (Patient not taking: Reported on 01/30/2024) 60 capsule 1   traZODone  (DESYREL ) 100 MG tablet Take 1 tablet (100 mg total) by mouth at bedtime. (Patient not taking: Reported on 01/30/2024) 90 tablet 3   No facility-administered medications prior to visit.     Review of Systems:   Constitutional:   No  weight loss, night sweats,  Fevers, chills,+ fatigue, or  lassitude.  HEENT:   No headaches,  Difficulty swallowing,  Tooth/dental problems, or  Sore throat,                No  sneezing, itching, ear ache, nasal congestion, post nasal drip,   CV:  No chest pain,  Orthopnea, PND, swelling in lower extremities, anasarca, dizziness, palpitations, syncope.   GI  No heartburn, indigestion, abdominal pain, nausea, vomiting, diarrhea, change in bowel habits, loss of appetite, bloody stools.   Resp:   No chest wall deformity  Skin: no rash or lesions.  GU: no dysuria, change in color of urine, no urgency or frequency.  No flank pain, no hematuria   MS:  No joint pain or swelling.  No decreased range of motion.  No back pain.    Physical Exam  BP (!) 147/77  Pulse 68   Temp 98.4 F (36.9 C)   Ht 5' 2 (1.575 m) Comment: Per pt  Wt 200 lb 3.2 oz (90.8 kg)   SpO2 96% Comment: RA  BMI 36.62 kg/m   GEN: A/Ox3; pleasant , NAD, well nourished    HEENT:  /AT,  NOSE-clear, THROAT-clear, no lesions, no postnasal drip or exudate noted.   NECK:  Supple w/ fair ROM; no JVD; normal carotid impulses w/o bruits; no thyromegaly or nodules palpated; no lymphadenopathy.    RESP  Clear  P & A; w/o, wheezes/ rales/ or rhonchi. no accessory muscle use, no dullness to percussion  CARD:  RRR, no m/r/g, no peripheral edema, pulses intact, no cyanosis or clubbing.  GI:   Soft & nt; nml bowel sounds; no organomegaly or masses detected.   Musco: Warm bil, no deformities or joint swelling noted.   Neuro: alert, no focal deficits noted.    Skin: Warm, no lesions or rashes    Lab Results:Reviewed 01/30/2024   CBC   BMET   BNP No results found for: BNP  ProBNP No results found for: PROBNP  Imaging: No results found.  Administration History     None          Latest Ref Rng & Units 05/22/2019    3:27 PM  PFT Results  FVC-Pre L 1.83   FVC-Predicted Pre % 66   FVC-Post L 1.93   FVC-Predicted Post % 69   Pre FEV1/FVC % % 66   Post FEV1/FCV % % 69   FEV1-Pre L 1.20   FEV1-Predicted Pre % 57   FEV1-Post L 1.33   DLCO uncorrected ml/min/mmHg 16.40    DLCO UNC% % 91   DLVA Predicted % 111     No results found for: NITRICOXIDE     01/30/2024   11:00 AM 05/26/2018    2:00 PM  Results of the [facility] flowsheet  Sitting and reading 1 0  Watching TV 1 1  Sitting, inactive in a public place (e.g. a theatre or a meeting) 1 0  As a passenger in a car for an hour without a break 2 0  Lying down to rest in the afternoon when circumstances permit 3 3  Sitting and talking to someone 0 0  Sitting quietly after a lunch without alcohol 0 1  In a car, while stopped for a few minutes in traffic 0 0  Total score 8 5        Assessment & Plan:   Assessment and Plan Allergic asthma-currently compensated on maintenance regimen.  Continue on Trelegy inhaler daily.  Continue Singulair  daily.  Asthma action plan discussed.  Albuterol  as needed.  Trigger prevention.  Continue on Dupixent .  Will discuss with the pharmacy team about a Dupixent  sample until her shipment is delivered.  Obstructive sleep apnea with excellent control and compliance on nocturnal CPAP.  Continue on current settings.  CPAP care discussed in detail. Order for new CPAP sent .  3.   Chronic insomnia-healthy sleep regimen discussed.  Has tried multiple medications without perceived benefit or increased side effect profile.  Continue with healthy sleep regimen as discussed. 4.  BMI 36-continue with healthy weight management  5.  Memory impairment, history of stroke.  Refer to neurology per request      Madelin Stank, NP 01/30/2024  I    [1]  Allergies Allergen Reactions   Astelin  [Azelastine ] Other (See Comments)    Per patient caused psychiatric illness  Nasonex  [Mometasone ] Other (See Comments)    Per patient caused psychiatric illness   Other     Can not eat carbohydrates without protein, is allergic to certain foods but can take in certain doses  [2]  Social History Tobacco Use  Smoking Status Former   Current packs/day: 0.00   Average packs/day: 2.0  packs/day for 15.0 years (30.0 ttl pk-yrs)   Types: Cigarettes   Start date: 9   Quit date: 2   Years since quitting: 46.0   Passive exposure: Past  Smokeless Tobacco Never   "

## 2024-01-31 ENCOUNTER — Telehealth: Payer: Self-pay

## 2024-01-31 NOTE — Telephone Encounter (Signed)
 Pt requested Dupixent  samples at today's visit on 01/31/24. Is this something that can be provided to her? Please advise, thank you.

## 2024-02-05 ENCOUNTER — Other Ambulatory Visit (HOSPITAL_COMMUNITY): Payer: Self-pay

## 2024-02-05 NOTE — Telephone Encounter (Signed)
 Received call back from pt, she stated she spoke to [facility] and was advised MD office should submit a pa to request her Dupixent  be no copay. I advised pt that the med is already approved, but the remaining cost is just what her copay is coming to. I advised her it looks like she paid her copays last year until she got to oop max. The year before she was using PAP but per notes she was not eligible last year due to income.  Pt is adamant that I call her insurance and see what it is they are asking. She states it was a mistake last year that she had to pay her full copays and thinks she should still be eligible for PAP. Advised pt I would call [employer] and see what can be done. Pt stated she is 2 weeks late for her shot, advised her we can provide a sample to help tide her over until copay issues are resolved. She stated she will try to come by today or tomorrow. Also advised pt we may have to try PAP again if no other solution.  Called [employer] at [phone], they advised it is possible to reduce copay with a tier exception request. Per rep tier exception will be automatically denied due to it being a specialty med, but we can still submit appeal. Verbally submitted tier exception pa (which was automatically denied) and appeal over the phone. Requested urgent review so will have 72 hour tat. Likely will also be denied and we will have to pursue PAP.  Will discuss details further with pt once a determination has been received. Will await response from appeal.  Case #: [id number] Phone #: [phone]

## 2024-02-05 NOTE — Telephone Encounter (Signed)
 LVM with pt to discuss Dupixent  options. Sent mychart message with copay options as well. Will await pt's response.

## 2024-02-05 NOTE — Telephone Encounter (Signed)
 Provided pt with Dupixent  Auto-Injector 300 mg/2 mL sample #2 pens (1 box):  NDC: 9975-4084-79 Lot: [id number] Exp: 09/07/25

## 2024-02-11 ENCOUNTER — Telehealth: Payer: Self-pay

## 2024-04-20 ENCOUNTER — Encounter: Admitting: Pulmonary Disease

## 2024-06-15 ENCOUNTER — Ambulatory Visit: Payer: Medicare HMO

## 2024-06-29 ENCOUNTER — Ambulatory Visit: Admitting: Adult Health
